# Patient Record
Sex: Female | Born: 1935 | Race: White | Hispanic: No | Marital: Single | Smoking: Never smoker
Health system: Southern US, Community
[De-identification: ages and names within clinical notes are randomized; demographics above are authoritative.]

## PROBLEM LIST (undated history)

## (undated) DIAGNOSIS — Z87820 Personal history of traumatic brain injury: Secondary | ICD-10-CM

## (undated) DIAGNOSIS — F0390 Unspecified dementia without behavioral disturbance: Secondary | ICD-10-CM

## (undated) DIAGNOSIS — E785 Hyperlipidemia, unspecified: Secondary | ICD-10-CM

## (undated) DIAGNOSIS — M1990 Unspecified osteoarthritis, unspecified site: Secondary | ICD-10-CM

## (undated) DIAGNOSIS — E559 Vitamin D deficiency, unspecified: Secondary | ICD-10-CM

## (undated) HISTORY — DX: Hyperlipidemia, unspecified: E78.5

## (undated) HISTORY — DX: Unspecified osteoarthritis, unspecified site: M19.90

## (undated) HISTORY — DX: Personal history of traumatic brain injury: Z87.820

## (undated) HISTORY — DX: Unspecified dementia, unspecified severity, without behavioral disturbance, psychotic disturbance, mood disturbance, and anxiety: F03.90

## (undated) HISTORY — DX: Vitamin D deficiency, unspecified: E55.9

## (undated) HISTORY — PX: CHOLECYSTECTOMY: SHX55

---

## 2019-01-14 ENCOUNTER — Encounter: Payer: Self-pay | Admitting: *Deleted

## 2019-01-14 ENCOUNTER — Non-Acute Institutional Stay (SKILLED_NURSING_FACILITY): Payer: Medicare Other | Admitting: Internal Medicine

## 2019-01-14 ENCOUNTER — Encounter: Payer: Self-pay | Admitting: Internal Medicine

## 2019-01-14 DIAGNOSIS — F0281 Dementia in other diseases classified elsewhere with behavioral disturbance: Secondary | ICD-10-CM

## 2019-01-14 DIAGNOSIS — E559 Vitamin D deficiency, unspecified: Secondary | ICD-10-CM | POA: Diagnosis not present

## 2019-01-14 DIAGNOSIS — G301 Alzheimer's disease with late onset: Secondary | ICD-10-CM | POA: Diagnosis not present

## 2019-01-14 DIAGNOSIS — R2681 Unsteadiness on feet: Secondary | ICD-10-CM | POA: Diagnosis not present

## 2019-01-14 DIAGNOSIS — E785 Hyperlipidemia, unspecified: Secondary | ICD-10-CM

## 2019-01-14 NOTE — Progress Notes (Signed)
Provider: Einar CrowGupta, Anjali  MD Location:  [facility] Room Number: [phone] [facility] Place of Service:  SNF [phone])  PCP: Mahlon GammonGupta, Anjali L, MD Patient Care Team: Mahlon GammonGupta, Anjali L, MD as PCP - General (Internal Medicine)  Extended Emergency Contact Information Primary Emergency Contact: Jearld LeschJeffreys, Margaret Mobile Phone: [phone] Relation: Relative  Code Status: Full Code Goals of Care: Advanced Directive information Advanced Directives 01/14/2019  Does Patient Have a Medical Advance Directive? Yes  Type of Estate agentAdvance Directive Healthcare Power of [place]orney;Living will  Does patient want to make changes to medical advance directive? No - Patient declined  Copy of Healthcare Power of Attorney in Chart? Yes - validated most recent copy scanned in chart (See row information)      Chief Complaint  Patient presents with  . New Admit To SNF    HPI: Patient is a 85 y.o. female seen today for admission to SNF for Long term Care Patient has h/o dementia did not have any history of imaging, hyperlipidemia, history of unstable gait,  Patient is unable to give detailed history most of the history was obtained from her previous PCP notes and social worker Patient was living by herself in [place].  She is not married and does not have any children.  Because of progressive dementia and inability for her to take care of herself she moved.with distant relative.  And he decided to move her to [facility]. Since patient has been here she has been very confused trying to [place] to go back to [place].  She refused to take Seroquel but today Nurse was able to convince her.  She was more calmer with me and told me that she understands she has to live in this facility. She did not have any acute complaints denies any cough or shortness of breath.  She does not take any medicines she says she tries to follow healthy diet and exercise. She was walking without any cane or walker.   Past Medical History:  Diagnosis Date  . Arthritis   . Dementia (HCC)   . Hx of concussion    [facility] [place]  . Hyperlipidemia   . Vitamin D deficiency, unspecified     The histories are not reviewed yet. Please review them in the "History" navigator section and refresh this SmartLink.  reports that she does not drink alcohol or use drugs. No history on file for tobacco. Social History   Socioeconomic History  . Marital status: Single    Spouse name: Not on file  . Number of children: Not on file  . Years of education: Not on file  . Highest education level: Not on file  Occupational History  . Not on file  Social Needs  . Financial resource strain: Not on file  . Food insecurity    Worry: Not on file    Inability: Not on file  . Transportation needs    Medical: Not on file    Non-medical: Not on file  Tobacco Use  . Smoking status: Not on file  Substance and Sexual Activity  . Alcohol use: Never    Frequency: Never  . Drug use: Never  . Sexual activity: Never  Lifestyle  . Physical activity    Days per week: Not on file    Minutes per session: Not on file  . Stress: Not on file  Relationships  . Social Musicianconnections    Talks on phone: Not on file    Gets together:  Not on file    Attends religious service: Not on file    Active member of club or organization: Not on file    Attends meetings of clubs or organizations: Not on file    Relationship status: Not on file  . Intimate partner violence    Fear of current or ex partner: Not on file    Emotionally abused: Not on file    Physically abused: Not on file    Forced sexual activity: Not on file  Other Topics Concern  . Not on file  Social History Narrative  . Not on file    Functional Status Survey:    No family history on file.  Health Maintenance  Topic Date Due  . TETANUS/TDAP  09/30/1951  . DEXA SCAN  10/01/1999  . PNA vac Low Risk Adult (1 of 2 - PCV13) 10/01/1999  .  INFLUENZA VACCINE  12/25/2018    Allergies  Allergen Reactions  . Fish-Derived Products   . Pravachol [Pravastatin Sodium]     Outpatient Encounter Medications as of 01/14/2019  Medication Sig  . QUEtiapine (SEROQUEL) 25 MG tablet Take 12.5 mg by mouth. Administer 12.5mg  po q6h PRN for agitation.   No facility-administered encounter medications on file as of 01/14/2019.     Review of Systems  Review of Systems  Constitutional: Negative for activity change, appetite change, chills, diaphoresis, fatigue and fever.  HENT: Negative for mouth sores, postnasal drip, rhinorrhea, sinus pain and sore throat.   Respiratory: Negative for apnea, cough, chest tightness, shortness of breath and wheezing.   Cardiovascular: Negative for chest pain, palpitations and leg swelling.  Gastrointestinal: Negative for abdominal distention, abdominal pain, constipation, diarrhea, nausea and vomiting.  Genitourinary: Negative for dysuria and frequency.  Musculoskeletal: Negative for arthralgias, joint swelling and myalgias.  Skin: Positive for rash.  Neurological: Negative for dizziness, syncope, weakness, light-headedness and numbness.  Psychiatric/Behavioral: Positive for behavioral problems, confusion and sleep disturbance.     Vitals:   01/14/19 1202  BP: 130/70  Pulse: 80  Resp: 20  Temp: (!) 97.3 F (36.3 C)  SpO2: 98%   There is no height or weight on file to calculate BMI. Physical Exam  Constitutional:  Well-developed and well-nourished.  HENT:  Head: Normocephalic.  Mouth/Throat: Oropharynx is clear and moist.  Eyes: Pupils are equal, round, and reactive to light.  Neck: Neck supple.  Cardiovascular: Normal rate and normal heart sounds.  No murmur heard. Pulmonary/Chest: Effort normal and breath sounds normal. No respiratory distress. No wheezes. She has no rales.  Abdominal: Soft. Bowel sounds are normal. No distension. There is no tenderness. There is no rebound.  Musculoskeletal:  No edema.  Lymphadenopathy: none Neurological: Her MMSE was 23/30. Did not do the Clock . Did not knew the year or date Recall was 0/3 No Focal deficits Skin: Skin is warm and dry.  Psychiatric: Normal mood and affect. Behavior is normal. Thought content normal.    Labs reviewed: Basic Metabolic Panel: No results for input(s): NA, K, CL, CO2, GLUCOSE, BUN, CREATININE, CALCIUM, MG, PHOS in the last 8760 hours. Liver Function Tests: No results for input(s): AST, ALT, ALKPHOS, BILITOT, PROT, ALBUMIN in the last 8760 hours. No results for input(s): LIPASE, AMYLASE in the last 8760 hours. No results for input(s): AMMONIA in the last 8760 hours. CBC: No results for input(s): WBC, NEUTROABS, HGB, HCT, MCV, PLT in the last 8760 hours. Cardiac Enzymes: No results for input(s): CKTOTAL, CKMB, CKMBINDEX, TROPONINI in the last 8760 hours.  BNP: Invalid input(s): POCBNP No results found for: HGBA1C No results found for: TSH No results found for: VITAMINB12 No results found for: FOLATE No results found for: IRON, TIBC, FERRITIN  Imaging and Procedures obtained prior to SNF admission: Patient was never admitted.  Assessment/Plan Late onset Alzheimer's disease with behavioral disturbance  Her Regular Labs were normal Check TSH,B12 level Has never had imaging but  don't think patient will agree She did agree to take Namenda BID. Says she got sick with older meds most likely Aricept Had to use Seroquel to calm her as she tries to [employer] Will Need to move to Memory Unit if her Covid is Negative Consider Depakote if continues behavior issues  Hyperlipidemia,  Fasting Lipid Pending  Vitamin D insufficiency Check Vit D Level Unsteady Gait with h/o Falls Supportive care for  Now Therapy Eval    Family/ staff Communication:   Labs/tests ordered:Lipid,TSH,B12,Vit D level Total time spent in this patient care encounter was  35_  minutes; greater than 50% of the visit spent  counseling patient and staff, reviewing records , Labs and coordinating care for problems addressed at this encounter.

## 2019-01-15 DIAGNOSIS — R2681 Unsteadiness on feet: Secondary | ICD-10-CM | POA: Insufficient documentation

## 2019-01-15 DIAGNOSIS — E559 Vitamin D deficiency, unspecified: Secondary | ICD-10-CM | POA: Insufficient documentation

## 2019-01-15 DIAGNOSIS — R419 Unspecified symptoms and signs involving cognitive functions and awareness: Secondary | ICD-10-CM | POA: Insufficient documentation

## 2019-01-15 DIAGNOSIS — E785 Hyperlipidemia, unspecified: Secondary | ICD-10-CM | POA: Insufficient documentation

## 2019-01-15 DIAGNOSIS — F0390 Unspecified dementia without behavioral disturbance: Secondary | ICD-10-CM | POA: Insufficient documentation

## 2019-01-15 DIAGNOSIS — F0150 Vascular dementia without behavioral disturbance: Secondary | ICD-10-CM | POA: Insufficient documentation

## 2019-01-27 ENCOUNTER — Non-Acute Institutional Stay (SKILLED_NURSING_FACILITY): Payer: Medicare Other | Admitting: Internal Medicine

## 2019-01-27 DIAGNOSIS — E785 Hyperlipidemia, unspecified: Secondary | ICD-10-CM | POA: Diagnosis not present

## 2019-01-27 DIAGNOSIS — F0281 Dementia in other diseases classified elsewhere with behavioral disturbance: Secondary | ICD-10-CM

## 2019-01-27 DIAGNOSIS — R2681 Unsteadiness on feet: Secondary | ICD-10-CM

## 2019-01-27 DIAGNOSIS — E559 Vitamin D deficiency, unspecified: Secondary | ICD-10-CM | POA: Diagnosis not present

## 2019-01-27 DIAGNOSIS — G301 Alzheimer's disease with late onset: Secondary | ICD-10-CM | POA: Diagnosis not present

## 2019-01-27 NOTE — Progress Notes (Signed)
Location:  [place] of Service:  SNF [phone]) Provider:    [name], MD  Patient Care Team: [name], MD as PCP - General (Internal Medicine)  Extended Emergency Contact Information Primary Emergency Contact: Arnoldo Morale Mobile Phone: [phone] Relation: Relative  Code Status:   Goals of care: Advanced Directive information Advanced Directives 01/14/2019  Does Patient Have a Medical Advance Directive? Yes  Type of Paramedic of [place];Living will  Does patient want to make changes to medical advance directive? No - Patient declined  Copy of [place] in Chart? Yes - validated most recent copy scanned in chart (See row information)     Chief Complaint  Patient presents with  . Acute Visit    HPI:  Pt is a 85 y.o. female seen today for an acute visit for behavior issues and discharge planning  Patient has h/o dementia did not have any history of imaging, hyperlipidemia, history of unstable gait,  Patient is unable to give detailed history most of the history was obtained from her previous PCP notes and social worker Patient was living by herself in [place].  She is not married and does not have any children.  Because of progressive dementia and inability for her to take care of herself she moved.with distant relative.  And he decided to move her to [facility].  Patient has been in the facility she was very confused and was trying to back her staff to go back to [place].  After my first visit I was able to convince her to start on Namenda.  And now she is also taking her Seroquel twice a day.  This has helped her calm down.  Today she was more adjusted to her room..  She said that she is liking it here and wants to continue staying here She stays independent in her ADLs.  Has a steady gait without any cane or walker Denies any acute complaints today Past Medical  History:  Diagnosis Date  . Arthritis   . Dementia ([place])   . Hx of concussion    [place]  . Hyperlipidemia   . Vitamin D deficiency, unspecified      Allergies  Allergen Reactions  . Fish-Derived Products   . Pravachol [Pravastatin Sodium]     Outpatient Encounter Medications as of 01/27/2019  Medication Sig  . memantine (NAMENDA) 5 MG tablet Take 5 mg by mouth 2 (two) times daily.  . QUEtiapine (SEROQUEL) 25 MG tablet Take 12.5 mg by mouth. Administer 12.5mg  po q6h PRN for agitation.   No facility-administered encounter medications on file as of 01/27/2019.     Review of Systems  Review of Systems  Constitutional: Negative for activity change, appetite change, chills, diaphoresis, fatigue and fever.  HENT: Negative for mouth sores, postnasal drip, rhinorrhea, sinus pain and sore throat.   Respiratory: Negative for apnea, cough, chest tightness, shortness of breath and wheezing.   Cardiovascular: Negative for chest pain, palpitations and leg swelling.  Gastrointestinal: Negative for abdominal distention, abdominal pain, constipation, diarrhea, nausea and vomiting.  Genitourinary: Negative for dysuria and frequency.  Musculoskeletal: Negative for arthralgias, joint swelling and myalgias.  Skin: Negative for rash.  Neurological: Negative for dizziness, syncope, weakness, light-headedness and numbness.  Psychiatric/Behavioral: Negative for behavioral problems, confusion and sleep disturbance.      There is no immunization history on file for this patient. Pertinent  Health Maintenance Due  Topic  Date Due  . DEXA SCAN  10/01/1999  . PNA vac Low Risk Adult (1 of 2 - PCV13) 10/01/1999  . INFLUENZA VACCINE  12/25/2018   No flowsheet data found. Functional Status Survey:    Vitals:   01/27/19 1816  BP: 120/62  Pulse: 73  Resp: 18  Temp: 98 F (36.7 C)   There is no height or weight on file to calculate BMI. Physical Exam Constitutional:  Oriented to person, place, and time. Well-developed and well-nourished.  HENT:  Head: Normocephalic.  Mouth/Throat: Oropharynx is clear and moist.  Eyes: Pupils are equal, round, and reactive to light.  Neck: Neck supple.  Cardiovascular: Normal rate and normal heart sounds.  No murmur heard. Pulmonary/Chest: Effort normal and breath sounds normal. No respiratory distress. No wheezes. She has no rales.  Abdominal: Soft. Bowel sounds are normal. No distension. There is no tenderness. There is no rebound.  Musculoskeletal: No edema.  Lymphadenopathy: none Neurological:Her MMSE was 23/30. Did not do the Clock . Did not knew the year or date Recall was 0/3 No Focal deficits  Skin: Skin is warm and dry.  Psychiatric: Normal mood and affect. Behavior is normal. Thought content normal.   Labs reviewed: No results for input(s): NA, K, CL, CO2, GLUCOSE, BUN, CREATININE, CALCIUM, MG, PHOS in the last 8760 hours. No results for input(s): AST, ALT, ALKPHOS, BILITOT, PROT, ALBUMIN in the last 8760 hours. No results for input(s): WBC, NEUTROABS, HGB, HCT, MCV, PLT in the last 8760 hours. No results found for: TSH No results found for: HGBA1C No results found for: CHOL, HDL, LDLCALC, LDLDIRECT, TRIG, CHOLHDL  Significant Diagnostic Results in last 30 days:  No results found.  Assessment/Plan Late onset Alzheimer's disease with behavioral disturbance  Her Regular Labs were normal MMSE was 23/30 Check TSH,B12 level are pending Has never had imaging but  don't think patient will agree Will increase her Namenda to 10 mg twice daily And make Seroquel 12.5 mg twice daily  Hyperlipidemia,  Fasting Lipid Pending  Vitamin D insufficiency Check Vit D Level Unsteady Gait with h/o Falls Supportive care for  Now Discharge Planning Discussed with DON Right now patient is doing little better and we are going to transition her to AL.  My only concern is elopement.  The nurses will check on patient  every few hours for the first few weeks when she just   Family/ staff Communication:   Labs/tests ordered:   Total time spent in this patient care encounter was  25_  minutes; greater than 50% of the visit spent counseling patient and staff, reviewing records , Labs and coordinating care for problems addressed at this encounter.

## 2019-02-09 ENCOUNTER — Encounter: Payer: Self-pay | Admitting: Nurse Practitioner

## 2019-02-09 ENCOUNTER — Non-Acute Institutional Stay (SKILLED_NURSING_FACILITY): Payer: Medicare Other | Admitting: Nurse Practitioner

## 2019-02-09 DIAGNOSIS — F0281 Dementia in other diseases classified elsewhere with behavioral disturbance: Secondary | ICD-10-CM

## 2019-02-09 DIAGNOSIS — G301 Alzheimer's disease with late onset: Secondary | ICD-10-CM

## 2019-02-09 DIAGNOSIS — E785 Hyperlipidemia, unspecified: Secondary | ICD-10-CM

## 2019-02-09 DIAGNOSIS — F02818 Dementia in other diseases classified elsewhere, unspecified severity, with other behavioral disturbance: Secondary | ICD-10-CM

## 2019-02-09 NOTE — Assessment & Plan Note (Addendum)
Will stay [facility] for safety, care assistance, continue Memantine 10mg  bid, Seroquel 12.5mg  bid. Obtain MMSE

## 2019-02-09 NOTE — Progress Notes (Signed)
Location:  [place] Room Number: [phone] Place of Service:  SNF [phone])  Provider: Marlana Latus  NP  PCP: [name], MD Patient Care Team: [name], MD as PCP - General (Internal Medicine)  Extended Emergency Contact Information Primary Emergency Contact: Arnoldo Morale Mobile Phone: [phone] Relation: Relative  Code Status: DNR Goals of care:  Advanced Directive information Advanced Directives 02/09/2019  Does Patient Have a Medical Advance Directive? Yes  Type of Advance Directive Out of facility DNR (pink MOST or yellow form)  Does patient want to make changes to medical advance directive? No - Patient declined  Copy of [place] in Chart? -  Pre-existing out of facility DNR order (yellow form or pink MOST form) Yellow form placed in chart (order not valid for inpatient use)     Allergies  Allergen Reactions  . Fish-Derived Products   . Pravachol [Pravastatin Sodium]     Chief Complaint  Patient presents with  . Discharge Note    HPI:  85 y.o. female with PMH dementia, on Memantine 10mg  bid. Her mood is stable on Quetiapine 12.5mg  bid. She spent a couple of weeks for quarantine prior to move in for long term care at [facility].     Past Medical History:  Diagnosis Date  . Arthritis   . Dementia ([place])   . Hx of concussion    [place]  . Hyperlipidemia   . Vitamin D deficiency, unspecified     Past Surgical History:  Procedure Laterality Date  . CHOLECYSTECTOMY        reports that she does not drink alcohol or use drugs. No history on file for tobacco. Social History   Socioeconomic History  . Marital status: Single    Spouse name: Not on file  . Number of children: Not on file  . Years of education: Not on file  . Highest education level: Not on file  Occupational History  . Not on file  Social Needs  . Financial resource strain: Not on file  . Food insecurity   Worry: Not on file    Inability: Not on file  . Transportation needs    Medical: Not on file    Non-medical: Not on file  Tobacco Use  . Smoking status: Not on file  Substance and Sexual Activity  . Alcohol use: Never    Frequency: Never  . Drug use: Never  . Sexual activity: Never  Lifestyle  . Physical activity    Days per week: Not on file    Minutes per session: Not on file  . Stress: Not on file  Relationships  . Social Herbalist on phone: Not on file    Gets together: Not on file    Attends religious service: Not on file    Active member of club or organization: Not on file    Attends meetings of clubs or organizations: Not on file    Relationship status: Not on file  . Intimate partner violence    Fear of current or ex partner: Not on file    Emotionally abused: Not on file    Physically abused: Not on file    Forced sexual activity: Not on file  Other Topics Concern  . Not on file  Social History Narrative  . Not on file   Functional Status Survey:    Allergies  Allergen Reactions  . Fish-Derived Products   . Pravachol [facility]  Sodium]     Pertinent  Health Maintenance Due  Topic Date Due  . DEXA SCAN  10/01/1999  . PNA vac Low Risk Adult (1 of 2 - PCV13) 10/01/1999  . INFLUENZA VACCINE  12/25/2018    Medications: Outpatient Encounter Medications as of 02/09/2019  Medication Sig  . memantine (NAMENDA) 10 MG tablet Take 10 mg by mouth 2 (two) times daily.  . QUEtiapine (SEROQUEL) 25 MG tablet Take 12.5 mg by mouth 2 (two) times daily. Administer 12.5mg  po q6h PRN for agitation.   . [DISCONTINUED] memantine (NAMENDA) 5 MG tablet Take 5 mg by mouth 2 (two) times daily.   No facility-administered encounter medications on file as of 02/09/2019.    ROS was provided with assistance of staff.  Review of Systems  Constitutional: Negative for activity change, appetite change, chills, diaphoresis, fatigue, fever and unexpected weight change.   HENT: Positive for hearing loss. Negative for congestion and voice change.   Respiratory: Negative for cough, shortness of breath and wheezing.   Cardiovascular: Negative for chest pain, palpitations and leg swelling.  Gastrointestinal: Negative for abdominal distention, abdominal pain, constipation, diarrhea, nausea and vomiting.  Genitourinary: Negative for difficulty urinating, dysuria and urgency.  Musculoskeletal: Negative for gait problem.  Skin: Negative for color change and pallor.  Neurological: Negative for dizziness, speech difficulty, weakness and headaches.       Dementia  Psychiatric/Behavioral: Positive for confusion. Negative for agitation, behavioral problems, hallucinations and sleep disturbance. The patient is not nervous/anxious.     Vitals:   02/09/19 1120  BP: 112/64  Pulse: 86  Resp: 20  Temp: 97.7 F (36.5 C)  SpO2: 95%  Weight: 148 lb 6.4 oz (67.3 kg)  Height: 5\' 6"  (1.676 m)   Body mass index is 23.95 kg/m. Physical Exam Vitals signs and nursing note reviewed.  Constitutional:      General: She is not in acute distress.    Appearance: Normal appearance. She is normal weight. She is not ill-appearing, toxic-appearing or diaphoretic.  HENT:     Head: Normocephalic and atraumatic.     Nose: Nose normal.     Mouth/Throat:     Mouth: Mucous membranes are moist.  Eyes:     Extraocular Movements: Extraocular movements intact.     Conjunctiva/sclera: Conjunctivae normal.     Pupils: Pupils are equal, round, and reactive to light.  Neck:     Musculoskeletal: Normal range of motion and neck supple.  Cardiovascular:     Rate and Rhythm: Normal rate and regular rhythm.     Heart sounds: No murmur.  Abdominal:     General: Bowel sounds are normal. There is no distension.     Palpations: Abdomen is soft.     Tenderness: There is no abdominal tenderness. There is no right CVA tenderness, left CVA tenderness, guarding or rebound.  Musculoskeletal:     Right  lower leg: No edema.     Left lower leg: No edema.  Skin:    General: Skin is warm and dry.  Neurological:     General: No focal deficit present.     Mental Status: She is alert. Mental status is at baseline.     Cranial Nerves: No cranial nerve deficit.     Motor: No weakness.     Coordination: Coordination normal.     Gait: Gait normal.     Comments: Oriented to person, place.   Psychiatric:        Mood and Affect: Mood normal.  Behavior: Behavior normal.     Labs reviewed: Basic Metabolic Panel: No results for input(s): NA, K, CL, CO2, GLUCOSE, BUN, CREATININE, CALCIUM, MG, PHOS in the last 8760 hours. Liver Function Tests: No results for input(s): AST, ALT, ALKPHOS, BILITOT, PROT, ALBUMIN in the last 8760 hours. No results for input(s): LIPASE, AMYLASE in the last 8760 hours. No results for input(s): AMMONIA in the last 8760 hours. CBC: No results for input(s): WBC, NEUTROABS, HGB, HCT, MCV, PLT in the last 8760 hours. Cardiac Enzymes: No results for input(s): CKTOTAL, CKMB, CKMBINDEX, TROPONINI in the last 8760 hours. BNP: Invalid input(s): POCBNP CBG: No results for input(s): GLUCAP in the last 8760 hours.  Procedures and Imaging Studies During Stay: No results found.  Assessment/Plan:   Dementia (HCC) Will stay AL [facility] for safety, care assistance, continue Memantine 10mg  bid, Seroquel 12.5mg  bid. Obtain MMSE  Hyperlipidemia Will update lipid panel.      Patient is being discharged with the following home health services:    Patient is being discharged with the following durable medical equipment:    Patient has been advised to f/u with their PCP in 1-2 weeks to for a transitions of care visit.  Social services at their facility was responsible for arranging this appointment.  Pt was provided with adequate prescriptions of noncontrolled medications to reach the scheduled appointment .  For controlled substances, a limited supply was provided as  appropriate for the individual patient.  If the pt normally receives these medications from a pain clinic or has a contract with another physician, these medications should be received from that clinic or physician only).    Future labs/tests needed: MMSE

## 2019-02-09 NOTE — Assessment & Plan Note (Signed)
Will update lipid panel.  °

## 2019-04-28 ENCOUNTER — Non-Acute Institutional Stay: Payer: Medicare Other | Admitting: Internal Medicine

## 2019-04-28 DIAGNOSIS — G301 Alzheimer's disease with late onset: Secondary | ICD-10-CM

## 2019-04-28 DIAGNOSIS — F0281 Dementia in other diseases classified elsewhere with behavioral disturbance: Secondary | ICD-10-CM

## 2019-04-28 DIAGNOSIS — F02818 Dementia in other diseases classified elsewhere, unspecified severity, with other behavioral disturbance: Secondary | ICD-10-CM

## 2019-04-28 DIAGNOSIS — E785 Hyperlipidemia, unspecified: Secondary | ICD-10-CM

## 2019-04-28 DIAGNOSIS — R2681 Unsteadiness on feet: Secondary | ICD-10-CM

## 2019-04-28 NOTE — Progress Notes (Addendum)
[facility] Service:  ALF (13)  Provider:   Code Status:  Goals of Care:  Advanced Directives 02/09/2019  Does Patient Have a Medical Advance Directive? Yes  Type of Advance Directive Out of facility DNR (pink MOST or yellow form)  Does patient want to make changes to medical advance directive? No - Patient declined  Copy of Healthcare Power of Attorney in Chart? -  Pre-existing out of facility DNR order (yellow form or pink MOST form) Yellow form placed in chart (order not valid for inpatient use)     Chief Complaint  Patient presents with  . Medical Management of Chronic Issues    HPI: Patient is a 85 y.o. female seen today for medical management of chronic diseases.   Patient is a long-term resident of assisted living  Patient has h/o dementia did not have any history of imaging, hyperlipidemia, history of unstable gait,  Patient is unable to give detailed history most of the history was obtained from her previous PCP notes and social worker Patient was living by herself in [place].  She is not married and does not have any children.  Because of progressive dementia and inability for her to take care of herself she moved.with distant relative.  And he decided [facility].  Since patient came here she has adjusted well to her assisted living facility. She did not have any complaints today.  She walks without any cane or walker.  Has not had any falls recently.  Per nurses she is doing well. She does have history of exit seeking trying to get back to [place]. Past Medical History:  Diagnosis Date  . Arthritis   . Dementia (HCC)   . Hx of concussion    [facility] [place]  . Hyperlipidemia   . Vitamin D deficiency, unspecified     Past Surgical History:  Procedure Laterality Date  . CHOLECYSTECTOMY      Allergies  Allergen Reactions  . Fish-Derived Products   . Pravachol  [Pravastatin Sodium]     Outpatient Encounter Medications as of 04/28/2019  Medication Sig  . memantine (NAMENDA) 10 MG tablet Take 10 mg by mouth 2 (two) times daily.  . QUEtiapine (SEROQUEL) 25 MG tablet Take 12.5 mg by mouth 2 (two) times daily. Administer 12.5mg  po q6h PRN for agitation.    No facility-administered encounter medications on file as of 04/28/2019.     Review of Systems:  Review of Systems  Review of Systems  Constitutional: Negative for activity change, appetite change, chills, diaphoresis, fatigue and fever.  HENT: Negative for mouth sores, postnasal drip, rhinorrhea, sinus pain and sore throat.   Respiratory: Negative for apnea, cough, chest tightness, shortness of breath and wheezing.   Cardiovascular: Negative for chest pain, palpitations and leg swelling.  Gastrointestinal: Negative for abdominal distention, abdominal pain, constipation, diarrhea, nausea and vomiting.  Genitourinary: Negative for dysuria and frequency.  Musculoskeletal: Negative for arthralgias, joint swelling and myalgias.  Skin: Negative for rash.  Neurological: Negative for dizziness, syncope, weakness, light-headedness and numbness.  Psychiatric/Behavioral: Negative for behavioral problems, confusion and sleep disturbance.     Health Maintenance  Topic Date Due  . TETANUS/TDAP  09/30/1951  . DEXA SCAN  10/01/1999  . PNA vac Low Risk Adult (1 of 2 - PCV13) 10/01/1999  . INFLUENZA VACCINE  12/25/2018    Physical Exam: Vitals:   04/28/19 1628  BP: 130/68  Pulse: 82  Resp: (!) 24  Temp: 97.8 F (36.6 C)  SpO2: 94%   There is no height or weight on file to calculate BMI. Physical Exam  Constitutional:  Well-developed and well-nourished.  HENT:  Head: Normocephalic.  Mouth/Throat: Oropharynx is clear and moist.  Eyes: Pupils are equal, round, and reactive to light.  Neck: Neck supple.  Cardiovascular: Normal rate and normal heart sounds.  No murmur heard. Pulmonary/Chest:  Effort normal and breath sounds normal. No respiratory distress. No wheezes. She has no rales.  Abdominal: Soft. Bowel sounds are normal. No distension. There is no tenderness. There is no rebound.  Musculoskeletal: No edema.  Lymphadenopathy: none Neurological: No Focal deficits Gait is steady Skin: Skin is warm and dry.  Psychiatric: Normal mood and affect. Behavior is normal. Thought content normal.    Labs reviewed: Basic Metabolic Panel: No results for input(s): NA, K, CL, CO2, GLUCOSE, BUN, CREATININE, CALCIUM, MG, PHOS, TSH in the last 8760 hours. Liver Function Tests: No results for input(s): AST, ALT, ALKPHOS, BILITOT, PROT, ALBUMIN in the last 8760 hours. No results for input(s): LIPASE, AMYLASE in the last 8760 hours. No results for input(s): AMMONIA in the last 8760 hours. CBC: No results for input(s): WBC, NEUTROABS, HGB, HCT, MCV, PLT in the last 8760 hours. Lipid Panel: No results for input(s): CHOL, HDL, LDLCALC, TRIG, CHOLHDL, LDLDIRECT in the last 8760 hours. No results found for: HGBA1C  Procedures since last visit: No results found.  Assessment/Plan Late onset Alzheimer's disease with behavioral disturbance ([place]) Continue on Namenda We will decrease her Seroquel to nightly and use 12.5 mg every morning as needed : Her MMSE was 23/30 in the Past Did not do the Clock . Did not knew the year or date Recall was 0/3 Hyperlipidemia, unspecified hyperlipidemia type LDL was elevated at 109 Patient would not agree to take any more medications Unsteady gait Is doing well with no recent falls  She had labs done in 8/20 TSH was normal Will repeat CBC and CMP     Labs/tests ordered:  * No order type specified * Next appt:  Visit date not found  Total time spent in this patient care encounter was  25_  minutes; greater than 50% of the visit spent counseling patient and staff, reviewing records , Labs and coordinating care for problems addressed at this encounter.

## 2019-05-03 LAB — CBC AND DIFFERENTIAL
HCT: 43 (ref 36–46)
Hemoglobin: 13.9 (ref 12.0–16.0)
Platelets: 263 (ref 150–399)
WBC: 6.8

## 2019-05-03 LAB — BASIC METABOLIC PANEL
BUN: 15 (ref 4–21)
CO2: 31 — AB (ref 13–22)
Chloride: 102 (ref 99–108)
Creatinine: 1 (ref 0.5–1.1)
Glucose: 92
Potassium: 4.5 (ref 3.4–5.3)
Sodium: 141 (ref 137–147)

## 2019-05-03 LAB — HEPATIC FUNCTION PANEL
ALT: 8 (ref 7–35)
AST: 14 (ref 13–35)
Alkaline Phosphatase: 81 (ref 25–125)
Bilirubin, Total: 0.4

## 2019-05-03 LAB — CBC: RBC: 4.94 (ref 3.87–5.11)

## 2019-06-21 ENCOUNTER — Non-Acute Institutional Stay: Payer: Medicare Other | Admitting: Internal Medicine

## 2019-06-21 ENCOUNTER — Encounter: Payer: Self-pay | Admitting: Internal Medicine

## 2019-06-21 DIAGNOSIS — F0281 Dementia in other diseases classified elsewhere with behavioral disturbance: Secondary | ICD-10-CM | POA: Diagnosis not present

## 2019-06-21 DIAGNOSIS — L309 Dermatitis, unspecified: Secondary | ICD-10-CM

## 2019-06-21 DIAGNOSIS — G301 Alzheimer's disease with late onset: Secondary | ICD-10-CM

## 2019-06-21 DIAGNOSIS — E785 Hyperlipidemia, unspecified: Secondary | ICD-10-CM | POA: Diagnosis not present

## 2019-06-21 DIAGNOSIS — F02818 Dementia in other diseases classified elsewhere, unspecified severity, with other behavioral disturbance: Secondary | ICD-10-CM

## 2019-06-21 NOTE — Progress Notes (Signed)
Location:   [facility] Room Number: 906 Place of Service:  ALF [phone]) Provider:  [name], MD  [name], MD  Patient Care Team: [name], MD as PCP - General (Internal Medicine)  Extended Emergency Contact Information Primary Emergency Contact: [name] Mobile Phone: [phone] Relation: Relative  Code Status:  DNR Goals of care: Advanced Directive information Advanced Directives 02/09/2019  Does Patient Have a Medical Advance Directive? Yes  Type of Advance Directive Out of facility DNR (pink MOST or yellow form)  Does patient want to make changes to medical advance directive? No - Patient declined  Copy of Healthcare Power of Attorney in Chart? -  Pre-existing out of facility DNR order (yellow form or pink MOST form) Yellow form placed in chart (order not valid for inpatient use)     Chief Complaint  Patient presents with  . Acute Visit    Rash    HPI:  Pt is a 86 y.o. female seen today for an acute visit for Rash in Lower Legs near Ankle  Patient is a long-term resident of assisted living  Patient has h/odementia do not have any imaging, hyperlipidemia, history of unstable gait,  Seen today for Rash in her Ankle area. Both Legs. It itches per patient. No Pain or swelling  Past Medical History:  Diagnosis Date  . Arthritis   . Dementia (HCC)   . Hx of concussion    [facility] [place]  . Hyperlipidemia   . Vitamin D deficiency, unspecified    Past Surgical History:  Procedure Laterality Date  . CHOLECYSTECTOMY      Allergies  Allergen Reactions  . Fish-Derived Products   . Pravachol [Pravastatin Sodium]     Allergies as of 06/21/2019      Reactions   Fish-derived Products    Pravachol [pravastatin Sodium]       Medication List       Accurate as of June 21, 2019 10:34 AM. If you have any questions, ask your nurse or doctor.        memantine 10 MG tablet Commonly known  as: NAMENDA Take 10 mg by mouth 2 (two) times daily.   QUEtiapine 25 MG tablet Commonly known as: SEROQUEL Take 12.5 mg by mouth 2 (two) times daily. Administer 12.5mg  po q6h PRN for agitation.       Review of Systems  Review of Systems  Constitutional: Negative for activity change, appetite change, chills, diaphoresis, fatigue and fever.  HENT: Negative for mouth sores, postnasal drip, rhinorrhea, sinus pain and sore throat.   Respiratory: Negative for apnea, cough, chest tightness, shortness of breath and wheezing.   Cardiovascular: Negative for chest pain, palpitations and leg swelling.  Gastrointestinal: Negative for abdominal distention, abdominal pain, constipation, diarrhea, nausea and vomiting.  Genitourinary: Negative for dysuria and frequency.  Musculoskeletal: Negative for arthralgias, joint swelling and myalgias.   Neurological: Negative for dizziness, syncope, weakness, light-headedness and numbness.  Psychiatric/Behavioral: Negative for behavioral problems, confusion and sleep disturbance.      There is no immunization history on file for this patient. Pertinent  Health Maintenance Due  Topic Date Due  . DEXA SCAN  10/01/1999  . PNA vac Low Risk Adult (1 of 2 - PCV13) 10/01/1999  . INFLUENZA VACCINE  12/25/2018   No flowsheet data found. Functional Status Survey:    Vitals:   06/21/19 1032  BP: 132/74  Pulse: 72  Resp: 18  Temp: 97.8 F (36.6 C)  SpO2: 93%  Weight: 152 lb 3.2 oz (69 kg)  Height: 5\' 6"  (1.676 m)   Body mass index is 24.57 kg/m. Physical Exam  Constitutional: . Well-developed and well-nourished.  HENT:  Head: Normocephalic.  Mouth/Throat: Oropharynx is clear and moist.  Eyes: Pupils are equal, round, and reactive to light.  Neck: Neck supple.  Cardiovascular: Normal rate and normal heart sounds.  No murmur heard. Pulmonary/Chest: Effort normal and breath sounds normal. No respiratory distress. No wheezes. She has no rales.    Abdominal: Soft. Bowel sounds are normal. No distension. There is no tenderness. There is no rebound.  Musculoskeletal: No edema.  Lymphadenopathy: none Neurological:Walking with No Assist Skin: Skin is warm and dry. Has Redness small area in her ankle both Legs with her Nail Marks. No Signs of infection Psychiatric: Normal mood and affect. Behavior is normal. Thought content normal.    Labs reviewed: Recent Labs    05/03/19 0000  NA 141  K 4.5  CL 102  CO2 31*  BUN 15  CREATININE 1.0   Recent Labs    05/03/19 0000  AST 14  ALT 8  ALKPHOS 81   Recent Labs    05/03/19 0000  WBC 6.8  HGB 13.9  HCT 43  PLT 263   No results found for: TSH No results found for: HGBA1C No results found for: CHOL, HDL, LDLCALC, LDLDIRECT, TRIG, CHOLHDL  Significant Diagnostic Results in last 30 days:  No results found.  Assessment/Plan Dermatitis Will try Triamcinolone 0.1 % BID till healed  Late onset Alzheimer's disease with behavioral disturbance ([place]) Continue Namenda and Seroquel       Family/ staff Communication:   Labs/tests ordered:   Total time spent in this patient care encounter was  25_  minutes; greater than 50% of the visit spent counseling patient and staff, reviewing records , Labs and coordinating care for problems addressed at this encounter.

## 2019-06-21 NOTE — Progress Notes (Signed)
Opened in error; Disregard.

## 2019-07-08 ENCOUNTER — Encounter: Payer: Self-pay | Admitting: Nurse Practitioner

## 2019-07-08 ENCOUNTER — Non-Acute Institutional Stay: Payer: Medicare Other | Admitting: Nurse Practitioner

## 2019-07-08 DIAGNOSIS — R2681 Unsteadiness on feet: Secondary | ICD-10-CM | POA: Diagnosis not present

## 2019-07-08 DIAGNOSIS — E785 Hyperlipidemia, unspecified: Secondary | ICD-10-CM

## 2019-07-08 NOTE — Assessment & Plan Note (Signed)
No falls since last visited.

## 2019-07-08 NOTE — Assessment & Plan Note (Signed)
Mildly elevated LDL 

## 2019-07-08 NOTE — Progress Notes (Signed)
Location:   [facility] Room Number: 906 Place of Service:  ALF (13) Provider:  Mast, Man NP  [name], MD  Patient Care Team: [name], MD as PCP - General (Internal Medicine)  Extended Emergency Contact Information Primary Emergency Contact: [name] Mobile Phone: [phone] Relation: Relative  Code Status:  DNR Goals of care: Advanced Directive information Advanced Directives 07/08/2019  Does Patient Have a Medical Advance Directive? Yes  Type of Advance Directive Healthcare Power of Attorney  Does patient want to make changes to medical advance directive? No - Patient declined  Copy of Healthcare Power of Attorney in Chart? Yes - validated most recent copy scanned in chart (See row information)  Pre-existing out of facility DNR order (yellow form or pink MOST form) -     Chief Complaint  Patient presents with  . Acute Visit    Confusion, anxiety    HPI:  Pt is a 86 y.o. female seen today for an acute visit for    Past Medical History:  Diagnosis Date  . Arthritis   . Dementia (HCC)   . Hx of concussion    [facility] [place]  . Hyperlipidemia   . Vitamin D deficiency, unspecified    Past Surgical History:  Procedure Laterality Date  . CHOLECYSTECTOMY      Allergies  Allergen Reactions  . Fish-Derived Products   . Pravachol [Pravastatin Sodium]     Allergies as of 07/08/2019      Reactions   Fish-derived Products    Pravachol [pravastatin Sodium]       Medication List       Accurate as of July 08, 2019  3:01 PM. If you have any questions, ask your nurse or doctor.        donepezil 5 MG tablet Commonly known as: ARICEPT Take 5 mg by mouth at bedtime.   memantine 10 MG tablet Commonly known as: NAMENDA Take 10 mg by mouth 2 (two) times daily.   QUEtiapine 25 MG tablet Commonly known as: SEROQUEL Take 12.5 mg by mouth 2 (two) times daily. Administer 12.5mg  po q6h PRN for  agitation.   triamcinolone cream 0.1 % Commonly known as: KENALOG Apply 1 application topically. Apply to rash on ankles twice a day until healed.       Review of Systems   There is no immunization history on file for this patient. Pertinent  Health Maintenance Due  Topic Date Due  . DEXA SCAN  10/01/1999  . PNA vac Low Risk Adult (1 of 2 - PCV13) 10/01/1999  . INFLUENZA VACCINE  12/25/2018   No flowsheet data found. Functional Status Survey:    Vitals:   07/08/19 1457  BP: 138/80  Pulse: 98  Resp: 20  Temp: 97.6 F (36.4 C)  SpO2: 94%  Weight: 156 lb 6.4 oz (70.9 kg)  Height: 5\' 6"  (1.676 m)   Body mass index is 25.24 kg/m. Physical Exam  Labs reviewed: Recent Labs    05/03/19 0000  NA 141  K 4.5  CL 102  CO2 31*  BUN 15  CREATININE 1.0   Recent Labs    05/03/19 0000  AST 14  ALT 8  ALKPHOS 81   Recent Labs    05/03/19 0000  WBC 6.8  HGB 13.9  HCT 43  PLT 263   No results found for: TSH No results found for: HGBA1C No results found for: CHOL, HDL, LDLCALC, LDLDIRECT, TRIG, CHOLHDL  Significant Diagnostic Results in last 30 days:  No results found.  Assessment/Plan There are no diagnoses linked to this encounter.   Family/ staff Communication:   Labs/tests ordered:

## 2019-07-08 NOTE — Progress Notes (Addendum)
Location:   [facility] Room Number: [place] of Service:  ALF (13) Provider: Lennie Odor Magdalene Tardiff NP  [name], MD  Patient Care Team: [name], MD as PCP - General (Internal Medicine)  Extended Emergency Contact Information Primary Emergency Contact: Arnoldo Morale Mobile Phone: [phone] Relation: Relative  Code Status: DNR Goals of care: Advanced Directive information Advanced Directives 07/08/2019  Does Patient Have a Medical Advance Directive? Yes  Type of Advance Directive [place]  Does patient want to make changes to medical advance directive? No - Patient declined  Copy of [place] in Chart? Yes - validated most recent copy scanned in chart (See row information)  Pre-existing out of facility DNR order (yellow form or pink MOST form) -     Chief Complaint  Patient presents with  . Acute Visit    Confusion, anxiety    HPI:  Pt is a 86 y.o. female seen today for an acute visit for staff reported the patient confusion, packing up her belongs occasionally, appears anxious when she is confused. The patient resides in [place] since 12/2018, taking Memantine for memory. She knows she is in [place], but not sure her current residential location is [place]. The patient denied pain or discomfort, admitted her memory deficits, stated she sleeps/eats well, conversing and smiling except repetitive at times. Hx of hyperlipidemia, unsteady gait, Vit D deficiency.    Past Medical History:  Diagnosis Date  . Arthritis   . Dementia ([place])   . Hx of concussion    [place]  . Hyperlipidemia   . Vitamin D deficiency, unspecified    Past Surgical History:  Procedure Laterality Date  . CHOLECYSTECTOMY      Allergies  Allergen Reactions  . Fish-Derived Products   . Pravachol [Pravastatin Sodium]     Allergies as of 07/08/2019      Reactions   Fish-derived Products    Pravachol [pravastatin Sodium]       Medication List       Accurate as of July 08, 2019  4:35 PM. If you have any questions, ask your nurse or doctor.        donepezil 5 MG tablet Commonly known as: ARICEPT Take 5 mg by mouth at bedtime.   memantine 10 MG tablet Commonly known as: NAMENDA Take 10 mg by mouth 2 (two) times daily.   QUEtiapine 25 MG tablet Commonly known as: SEROQUEL Take 12.5 mg by mouth 2 (two) times daily. Administer 12.43m po q6h PRN for agitation.   triamcinolone cream 0.1 % Commonly known as: KENALOG Apply 1 application topically. Apply to rash on ankles twice a day until healed.      ROS was provided with assistance of staff.  Review of Systems  Constitutional: Negative for activity change, appetite change, chills, diaphoresis, fatigue and fever.  HENT: Positive for hearing loss. Negative for congestion and voice change.   Eyes: Negative for visual disturbance.  Respiratory: Negative for cough and shortness of breath.   Cardiovascular: Negative for chest pain, palpitations and leg swelling.  Gastrointestinal: Negative for abdominal distention, abdominal pain, constipation, diarrhea, nausea and vomiting.  Genitourinary: Negative for difficulty urinating, dysuria and urgency.  Musculoskeletal: Positive for gait problem.  Skin: Negative for color change and pallor.  Neurological: Negative for dizziness, speech difficulty, weakness and headaches.       Dementia  Psychiatric/Behavioral: Positive for confusion. Negative for agitation, behavioral problems, hallucinations and  sleep disturbance. The patient is not nervous/anxious.      There is no immunization history on file for this patient. Pertinent  Health Maintenance Due  Topic Date Due  . DEXA SCAN  10/01/1999  . PNA vac Low Risk Adult (1 of 2 - PCV13) 10/01/1999  . INFLUENZA VACCINE  12/25/2018   No flowsheet data found. Functional Status Survey:    Vitals:   07/08/19 1457  BP: 138/80   Pulse: 98  Resp: 20  Temp: 97.6 F (36.4 C)  SpO2: 94%  Weight: 156 lb 6.4 oz (70.9 kg)  Height: '5\' 6"'  (1.676 m)   Body mass index is 25.24 kg/m. Physical Exam Vitals and nursing note reviewed.  Constitutional:      General: She is not in acute distress.    Appearance: Normal appearance. She is not ill-appearing, toxic-appearing or diaphoretic.  HENT:     Head: Normocephalic and atraumatic.     Nose: Nose normal.     Mouth/Throat:     Mouth: Mucous membranes are moist.  Eyes:     Extraocular Movements: Extraocular movements intact.     Conjunctiva/sclera: Conjunctivae normal.     Pupils: Pupils are equal, round, and reactive to light.  Cardiovascular:     Rate and Rhythm: Normal rate and regular rhythm.     Heart sounds: No murmur.  Pulmonary:     Breath sounds: No wheezing, rhonchi or rales.  Abdominal:     General: Bowel sounds are normal. There is no distension.     Palpations: Abdomen is soft.     Tenderness: There is no abdominal tenderness. There is no right CVA tenderness, left CVA tenderness, guarding or rebound.  Musculoskeletal:     Cervical back: Normal range of motion and neck supple.     Right lower leg: No edema.     Left lower leg: No edema.  Skin:    General: Skin is warm and dry.  Neurological:     General: No focal deficit present.     Mental Status: She is alert. Mental status is at baseline.     Motor: No weakness.     Coordination: Coordination normal.     Gait: Gait abnormal.     Comments: Oriented to person, long term memory about her mom, dad, education, her occupation, home etc seems intact  Psychiatric:        Mood and Affect: Mood normal.        Behavior: Behavior normal.        Thought Content: Thought content normal.     Labs reviewed: Recent Labs    05/03/19 0000  NA 141  K 4.5  CL 102  CO2 31*  BUN 15  CREATININE 1.0   Recent Labs    05/03/19 0000  AST 14  ALT 8  ALKPHOS 81   Recent Labs    05/03/19 0000  WBC  6.8  HGB 13.9  HCT 43  PLT 263   No results found for: TSH No results found for: HGBA1C No results found for: CHOL, HDL, LDLCALC, LDLDIRECT, TRIG, CHOLHDL  Significant Diagnostic Results in last 30 days:  No results found.  Assessment/Plan: Unsteady gait No falls since last visited.   Hyperlipidemia Mildly elevated LDL     Family/ staff Communication: plan of care reviewed with the patient and charge nurse.   Labs/tests ordered:  CBC/diff, CMP/eGFR, TSH, Vit B12  Time spend 40 minutes.

## 2019-07-12 LAB — BASIC METABOLIC PANEL
BUN: 13 (ref 4–21)
CO2: 30 — AB (ref 13–22)
Chloride: 104 (ref 99–108)
Creatinine: 1 (ref 0.5–1.1)
Glucose: 91
Potassium: 4.4 (ref 3.4–5.3)
Sodium: 141 (ref 137–147)

## 2019-07-12 LAB — COMPREHENSIVE METABOLIC PANEL
Albumin: 3.6 (ref 3.5–5.0)
Calcium: 9.1 (ref 8.7–10.7)
GFR calc Af Amer: 61
GFR calc non Af Amer: 53

## 2019-07-12 LAB — HEPATIC FUNCTION PANEL
ALT: 8 (ref 7–35)
AST: 13 (ref 13–35)
Alkaline Phosphatase: 73 (ref 25–125)
Bilirubin, Total: 0.4

## 2019-07-12 LAB — CBC: RBC: 4.66 (ref 3.87–5.11)

## 2019-07-12 LAB — CBC AND DIFFERENTIAL
HCT: 40 (ref 36–46)
Hemoglobin: 12.9 (ref 12.0–16.0)
Neutrophils Absolute: 4158
Platelets: 231 (ref 150–399)
WBC: 6.6

## 2019-07-12 LAB — VITAMIN B12: Vitamin B-12: 466

## 2019-07-21 ENCOUNTER — Non-Acute Institutional Stay: Payer: Medicare Other | Admitting: Internal Medicine

## 2019-07-21 DIAGNOSIS — F0281 Dementia in other diseases classified elsewhere with behavioral disturbance: Secondary | ICD-10-CM

## 2019-07-21 DIAGNOSIS — G301 Alzheimer's disease with late onset: Secondary | ICD-10-CM | POA: Diagnosis not present

## 2019-07-21 NOTE — Progress Notes (Signed)
Location:  [facility] Room Number: 906-A Place of Service:  ALF [phone]) Provider:  [name], MD  Patient Care Team: [name], MD as PCP - General (Internal Medicine)  Extended Emergency Contact Information Primary Emergency Contact: Arnoldo Morale Mobile Phone: [phone] Relation: Relative  Code Status:  DNR Goals of care: Advanced Directive information Advanced Directives 07/21/2019  Does Patient Have a Medical Advance Directive? Yes  Type of Advance Directive Out of facility DNR (pink MOST or yellow form)  Does patient want to make changes to medical advance directive? No - Patient declined  Copy of [place] in Chart? No - copy requested  Pre-existing out of facility DNR order (yellow form or pink MOST form) -     Chief Complaint  Patient presents with  . Acute Visit    Patient is seen for behavior issues.    HPI:  Pt is an 86 y.o. female seen today for an acute visit for Behavior Issues  Patient is a long-term resident of assisted living Patient has h/odementia no recent  imaging, hyperlipidemia, history of unstable gait  Patient was living by herself in [place].  She is not married and does not have any children.  Because of progressive dementia and inability for her to take care of herself she moved.with distant relative.  And he decided to move her to [facility] home community.  She has been doing well but recently she has been calling her POA in the evening and Packing her bags to leave the Facility. POA is worried for her safety and wants to know if we can do something to help her Patient unable to give detail history. She is very active walks very fast with no cane or walker. Appetite Good. Weight stable She says she is went for the trip recently and is planning to go home somewhere in [place] in the Room  Past Medical History:  Diagnosis Date  . Arthritis   . Dementia  ([place])   . Hx of concussion    [place]  . Hyperlipidemia   . Vitamin D deficiency, unspecified    Past Surgical History:  Procedure Laterality Date  . CHOLECYSTECTOMY      Allergies  Allergen Reactions  . Fish-Derived Products   . Pravachol [Pravastatin Sodium]     Outpatient Encounter Medications as of 07/21/2019  Medication Sig  . donepezil (ARICEPT) 5 MG tablet Take 5 mg by mouth at bedtime.  . memantine (NAMENDA) 10 MG tablet Take 10 mg by mouth 2 (two) times daily.  . QUEtiapine (SEROQUEL) 25 MG tablet Take 12.5 mg by mouth 2 (two) times daily. Administer 12.5mg  po q6h PRN for agitation.   . triamcinolone cream (KENALOG) 0.1 % Apply 1 application topically. Apply to rash on ankles twice a day until healed.   No facility-administered encounter medications on file as of 07/21/2019.    Review of Systems  Review of Systems  Constitutional: Negative for activity change, appetite change, chills, diaphoresis, fatigue and fever.  HENT: Negative for mouth sores, postnasal drip, rhinorrhea, sinus pain and sore throat.   Respiratory: Negative for apnea, cough, chest tightness, shortness of breath and wheezing.   Cardiovascular: Negative for chest pain, palpitations and leg swelling.  Gastrointestinal: Negative for abdominal distention, abdominal pain, constipation, diarrhea, nausea and vomiting.  Genitourinary: Negative for dysuria and frequency.  Musculoskeletal: Negative for arthralgias, joint swelling and myalgias.  Skin: Negative for rash.  Neurological:  Negative for dizziness, syncope, weakness, light-headedness and numbness.  Psychiatric/Behavioral: Negative for behavioral problems, confusion and sleep disturbance.     Immunization History  Administered Date(s) Administered  . Influenza-Unspecified 02/26/2019  . Moderna SARS-COVID-2 Vaccination 05/28/2019, 06/25/2019   Pertinent  Health Maintenance Due  Topic Date Due  . DEXA SCAN  10/01/1999  .  PNA vac Low Risk Adult (1 of 2 - PCV13) 10/01/1999  . INFLUENZA VACCINE  Completed   No flowsheet data found. Functional Status Survey:    Vitals:   07/21/19 1509  BP: 138/60  Pulse: 64  Resp: 18  Temp: (!) 97.3 F (36.3 C)  TempSrc: Oral  SpO2: 97%  Weight: 156 lb 6.4 oz (70.9 kg)  Height: 5\' 6"  (1.676 m)   Body mass index is 25.24 kg/m. Physical Exam  Constitutional:  Well-developed and well-nourished.  HENT:  Head: Normocephalic.  Mouth/Throat: Oropharynx is clear and moist.  Eyes: Pupils are equal, round, and reactive to light.  Neck: Neck supple.  Cardiovascular: Normal rate and normal heart sounds.  No murmur heard. Pulmonary/Chest: Effort normal and breath sounds normal. No respiratory distress. No wheezes. She has no rales.  Abdominal: Soft. Bowel sounds are normal. No distension. There is no tenderness. There is no rebound.  Musculoskeletal: No edema.  Lymphadenopathy: none Neurological: Not Oriented to time or place No Focal deficits. Walk very fast with no assists Skin: Skin is warm and dry.  Psychiatric: Normal mood and affect. Behavior is normal. Thought content normal.    Labs reviewed: Recent Labs    05/03/19 0000  NA 141  K 4.5  CL 102  CO2 31*  BUN 15  CREATININE 1.0   Recent Labs    05/03/19 0000  AST 14  ALT 8  ALKPHOS 81   Recent Labs    05/03/19 0000  WBC 6.8  HGB 13.9  HCT 43  PLT 263   No results found for: TSH No results found for: HGBA1C No results found for: CHOL, HDL, LDLCALC, LDLDIRECT, TRIG, CHOLHDL  Significant Diagnostic Results in last 30 days:  No results found.  Assessment/Plan Late onset Alzheimer's disease with behavioral disturbance  Her Regular Labs were normal Check TSH,B12 level Has never had imaging but  don't think patient will agree At this time will continue Namenda Increase her Seroquel to 25mg  Will try to give her early  in the evening See if it helps   Family/ staff Communication:    Labs/tests ordered:    Total time spent in this patient care encounter was  25_  minutes; greater than 50% of the visit spent counseling patient and staff, reviewing records , Labs and coordinating care for problems addressed at this encounter.

## 2019-07-26 ENCOUNTER — Encounter: Payer: Self-pay | Admitting: Internal Medicine

## 2019-07-26 ENCOUNTER — Non-Acute Institutional Stay: Payer: Medicare Other | Admitting: Internal Medicine

## 2019-07-26 DIAGNOSIS — G301 Alzheimer's disease with late onset: Secondary | ICD-10-CM

## 2019-07-26 DIAGNOSIS — F02818 Dementia in other diseases classified elsewhere, unspecified severity, with other behavioral disturbance: Secondary | ICD-10-CM

## 2019-07-26 DIAGNOSIS — F0281 Dementia in other diseases classified elsewhere with behavioral disturbance: Secondary | ICD-10-CM | POA: Diagnosis not present

## 2019-07-26 NOTE — Progress Notes (Signed)
Location:  [facility] Room Number: 906-A Place of Service:  ALF [phone]) Provider:  [name], MD  Patient Care Team: [name], MD as PCP - General (Internal Medicine)  Extended Emergency Contact Information Primary Emergency Contact: [name] Mobile Phone: [phone] Relation: Relative  Code Status:  DNR Goals of care: Advanced Directive information Advanced Directives 07/21/2019  Does Patient Have a Medical Advance Directive? Yes  Type of Advance Directive Out of facility DNR (pink MOST or yellow form)  Does patient want to make changes to medical advance directive? No - Patient declined  Copy of Healthcare Power of Attorney in Chart? No - copy requested  Pre-existing out of facility DNR order (yellow form or pink MOST form) -     Chief Complaint  Patient presents with  . Acute Visit    Patient is seen for behavior issues    HPI:  Pt is a 86 y.o. female seen today for an acute visit for Behavior Issues Patient is a long-term resident of assisted living Patient has h/odementia no recent imaging, hyperlipidemia, history of unstable gait  Patient was living by herself in [place]. She is not married and does not have any children. Because of progressive dementia and inability for her to take care of herself she moved.with distant relative.And he decided to move her to friend's home community.  Recently Patient is getting very disruptive. Trying to leave the facility. I had increased her Seroquel to 25mg  HS But she today was very upset Had her bags Packed . Said someone is sending her Mail and trying to get her money. She wants to go back to her house in  Past Medical History:  Diagnosis Date  . Arthritis   . Dementia (HCC)   . Hx of concussion    [place] [employer]  . Hyperlipidemia   . Vitamin D deficiency, unspecified    Past Surgical History:  Procedure Laterality Date  .  CHOLECYSTECTOMY      Allergies  Allergen Reactions  . Fish-Derived Products   . Pravachol [Pravastatin Sodium]     Outpatient Encounter Medications as of 07/26/2019  Medication Sig  . donepezil (ARICEPT) 5 MG tablet Take 5 mg by mouth at bedtime.  . memantine (NAMENDA) 10 MG tablet Take 10 mg by mouth 2 (two) times daily.  . QUEtiapine (SEROQUEL) 25 MG tablet Take 12.5 mg by mouth in the morning.   09/25/2019 QUEtiapine (SEROQUEL) 25 MG tablet Take 25 mg by mouth at bedtime.  . triamcinolone cream (KENALOG) 0.1 % Apply 1 application topically. Apply to rash on ankles twice a day until healed.   No facility-administered encounter medications on file as of 07/26/2019.    Review of Systems  Keep saying I am fine nothing is wrong with me  Immunization History  Administered Date(s) Administered  . Influenza-Unspecified 02/26/2019  . Moderna SARS-COVID-2 Vaccination 05/28/2019, 06/25/2019   Pertinent  Health Maintenance Due  Topic Date Due  . DEXA SCAN  10/01/1999  . PNA vac Low Risk Adult (1 of 2 - PCV13) 10/01/1999  . INFLUENZA VACCINE  Completed   No flowsheet data found. Functional Status Survey:    Vitals:   07/26/19 1605  BP: 138/60  Pulse: 64  Resp: 18  Temp: 98.7 F (37.1 C)  TempSrc: Oral  SpO2: 93%  Weight: 151 lb 9.6 oz (68.8 kg)  Height: 5\' 6"  (1.676 m)   Body mass index is 24.47 kg/m. Physical Exam Constitutional:  Well-developed and well-nourished.  HENT:  Head: Normocephalic.  Mouth/Throat: Oropharynx is clear and moist.  Eyes: Pupils are equal, round, and reactive to light.  Neck: Neck supple.  Cardiovascular: Normal rate and normal heart sounds.  No murmur heard. Pulmonary/Chest: Effort normal and breath sounds normal. No respiratory distress. No wheezes. She has no rales.  Abdominal: Soft. Bowel sounds are normal. No distension. There is no tenderness. There is no rebound.  Musculoskeletal: No edema.  Lymphadenopathy: none Neurological:Very Anxious   Says she is visiting this place and has to get back home in [place] to do her taxes. No Focal Deficits Skin: Skin is warm and dry.  Psychiatric: Normal mood and affect. Behavior is normal. Thought content normal.   Labs reviewed: Recent Labs    05/03/19 0000 07/12/19 0000  NA 141 141  K 4.5 4.4  CL 102 104  CO2 31* 30*  BUN 15 13  CREATININE 1.0 1.0  CALCIUM  --  9.1   Recent Labs    05/03/19 0000 07/12/19 0000  AST 14 13  ALT 8 8  ALKPHOS 81 73  ALBUMIN  --  3.6   Recent Labs    05/03/19 0000 07/12/19 0000  WBC 6.8 6.6  NEUTROABS  --  4,158  HGB 13.9 12.9  HCT 43 40  PLT 263 231   No results found for: TSH No results found for: HGBA1C No results found for: CHOL, HDL, LDLCALC, LDLDIRECT, TRIG, CHOLHDL  Significant Diagnostic Results in last 30 days:  No results found.  Assessment/Plan Late onset Alzheimer's disease with behavioral disturbance (HCC) Will Increase her Seroquel to 25 mg BID Already on Namenda  Ativan BID but patient refusing it Labs few week ago were normal No other sign of infection or any other issue Continue to follow Does need Imaging but patient will not agree    Family/ staff Communication:   Labs/tests ordered:

## 2019-08-24 ENCOUNTER — Encounter: Payer: Self-pay | Admitting: Nurse Practitioner

## 2019-08-24 ENCOUNTER — Non-Acute Institutional Stay: Payer: Medicare Other | Admitting: Nurse Practitioner

## 2019-08-24 DIAGNOSIS — R42 Dizziness and giddiness: Secondary | ICD-10-CM

## 2019-08-24 DIAGNOSIS — G301 Alzheimer's disease with late onset: Secondary | ICD-10-CM

## 2019-08-24 DIAGNOSIS — R451 Restlessness and agitation: Secondary | ICD-10-CM

## 2019-08-24 DIAGNOSIS — R2681 Unsteadiness on feet: Secondary | ICD-10-CM

## 2019-08-24 DIAGNOSIS — F02818 Dementia in other diseases classified elsewhere, unspecified severity, with other behavioral disturbance: Secondary | ICD-10-CM

## 2019-08-24 DIAGNOSIS — F0281 Dementia in other diseases classified elsewhere with behavioral disturbance: Secondary | ICD-10-CM

## 2019-08-24 NOTE — Assessment & Plan Note (Addendum)
No agitation-pacing, packing in evenings  presently, continue Seroquel, dc Lorazepam, observe.

## 2019-08-24 NOTE — Progress Notes (Signed)
Location:   [facility] Room Number: [place] of Service:  ALF [phone]) Provider:  Shabree Tebbetts, NP   Patient Care Team: [name], MD as PCP - General (Internal Medicine)  Extended Emergency Contact Information Primary Emergency Contact: Arnoldo Morale Mobile Phone: [phone] Relation: Relative  Code Status:  DNR Goals of care: Advanced Directive information Advanced Directives 08/24/2019  Does Patient Have a Medical Advance Directive? Yes  Type of Advance Directive Living will;Healthcare Power of [place];Out of facility DNR (pink MOST or yellow form)  Does patient want to make changes to medical advance directive? No - Patient declined  Copy of [place] in Chart? Yes - validated most recent copy scanned in chart (See row information)  Pre-existing out of facility DNR order (yellow form or pink MOST form) -     Chief Complaint  Patient presents with  . Acute Visit    Dizziness/Sleepy    HPI:  Pt is a 86 y.o. female seen today for an acute visit for c/o lightheaded and falls asleep right after Lorazepam 0.23m bid started for agitation, Seroquel was increased to 274mqd 07/21/19 then increased to  bid 07/26/19. Also the patient stated she has to follows a line on the floor to walk straight, feels wobbly, like getting drunk after taking Lorazepam. She denied headache, change of vision, chest pain/pressure, palpitation, or focal weakness. Hx of dementia[facility], on Memantine and Donepezil 37m96md for memory.    Past Medical History:  Diagnosis Date  . Arthritis   . Dementia ([place] . Hx of concussion    [place] Hyperlipidemia   . Vitamin D deficiency, unspecified    Past Surgical History:  Procedure Laterality Date  . CHOLECYSTECTOMY      Allergies  Allergen Reactions  . Fish-Derived Products   . Pravachol [Pravastatin Sodium]     Allergies as of  08/24/2019      Reactions   Fish-derived Products    Pravachol [pravastatin Sodium]       Medication List       Accurate as of August 24, 2019  3:09 PM. If you have any questions, ask your nurse or doctor.        STOP taking these medications   LORazepam 0.5 MG tablet Commonly known as: ATIVAN Stopped by: Sher Hellinger X Marykatherine Sherwood, NP     TAKE these medications   donepezil 5 MG tablet Commonly known as: ARICEPT Take 5 mg by mouth at bedtime.   memantine 10 MG tablet Commonly known as: NAMENDA Take 10 mg by mouth 2 (two) times daily.   QUEtiapine 25 MG tablet Commonly known as: SEROQUEL Take 25 mg by mouth at bedtime. What changed: Another medication with the same name was removed. Continue taking this medication, and follow the directions you see here. Changed by: Corisa Montini X Kathyleen Radice, NP   triamcinolone cream 0.1 % Commonly known as: KENALOG Apply 1 application topically. Apply to rash on ankles twice a day until healed.       Review of Systems  Constitutional: Negative for activity change, appetite change, fatigue and fever.  HENT: Positive for hearing loss. Negative for congestion and voice change.   Eyes: Negative for visual disturbance.  Respiratory: Negative for cough and shortness of breath.   Cardiovascular: Negative for chest pain, palpitations and leg swelling.  Gastrointestinal: Negative for abdominal distention, abdominal pain and constipation.  Genitourinary: Negative  for difficulty urinating, dysuria and urgency.  Musculoskeletal: Positive for gait problem.  Skin: Negative for color change.  Neurological: Positive for dizziness and light-headedness. Negative for tremors, seizures, syncope, facial asymmetry, speech difficulty, weakness and headaches.       Dementia  Psychiatric/Behavioral: Positive for confusion. Negative for agitation, behavioral problems and sleep disturbance. The patient is not nervous/anxious.     Immunization History  Administered Date(s) Administered    . Influenza-Unspecified 02/26/2019  . Moderna SARS-COVID-2 Vaccination 05/28/2019, 06/25/2019   Pertinent  Health Maintenance Due  Topic Date Due  . DEXA SCAN  Never done  . PNA vac Low Risk Adult (1 of 2 - PCV13) Never done  . INFLUENZA VACCINE  Completed   No flowsheet data found. Functional Status Survey:    Vitals:   08/24/19 1338  BP: 126/62  Pulse: 64  Resp: 18  Temp: 98 F (36.7 C)  SpO2: 96%  Weight: 153 lb 9.6 oz (69.7 kg)  Height: '5\' 6"'  (1.676 m)   Body mass index is 24.79 kg/m. Physical Exam Vitals and nursing note reviewed.  Constitutional:      General: She is not in acute distress.    Appearance: Normal appearance. She is not ill-appearing, toxic-appearing or diaphoretic.  HENT:     Head: Normocephalic and atraumatic.     Nose: Nose normal.     Mouth/Throat:     Mouth: Mucous membranes are moist.  Eyes:     Extraocular Movements: Extraocular movements intact.     Conjunctiva/sclera: Conjunctivae normal.     Pupils: Pupils are equal, round, and reactive to light.  Cardiovascular:     Rate and Rhythm: Normal rate and regular rhythm.     Heart sounds: No murmur.  Pulmonary:     Breath sounds: No wheezing, rhonchi or rales.  Abdominal:     General: Bowel sounds are normal. There is no distension.     Palpations: Abdomen is soft.     Tenderness: There is no abdominal tenderness.  Musculoskeletal:        General: No swelling or tenderness.     Cervical back: Normal range of motion and neck supple.     Right lower leg: No edema.     Left lower leg: No edema.  Skin:    General: Skin is warm and dry.  Neurological:     General: No focal deficit present.     Mental Status: She is alert. Mental status is at baseline.     Cranial Nerves: No cranial nerve deficit.     Motor: No weakness.     Coordination: Coordination normal.     Gait: Gait abnormal.     Comments: Oriented to person, long term memory about her mom, dad, education, her occupation, home  etc seems intact  Psychiatric:        Mood and Affect: Mood normal.        Behavior: Behavior normal.        Thought Content: Thought content normal.     Labs reviewed: Recent Labs    05/03/19 0000 07/12/19 0000  NA 141 141  K 4.5 4.4  CL 102 104  CO2 31* 30*  BUN 15 13  CREATININE 1.0 1.0  CALCIUM  --  9.1   Recent Labs    05/03/19 0000 07/12/19 0000  AST 14 13  ALT 8 8  ALKPHOS 81 73  ALBUMIN  --  3.6   Recent Labs    05/03/19 0000 07/12/19 0000  WBC 6.8 6.6  NEUTROABS  --  4,158  HGB 13.9 12.9  HCT 43 40  PLT 263 231   No results found for: TSH No results found for: HGBA1C No results found for: CHOL, HDL, LDLCALC, LDLDIRECT, TRIG, CHOLHDL  Significant Diagnostic Results in last 30 days:  No results found.  Assessment/Plan Dizziness c/o lightheaded and falls asleep right after Lorazepam 0.77m bid started for agitation, Seroquel was increased to 220mqd 07/21/19 then increased to  bid 07/26/19. Also the patient stated she has to follows a line on the floor to walk straight, feels wobbly, like getting drunk after taking Lorazepam. She denied headache, change of vision, chest pain/pressure, palpitation, or focal weakness.  The patient attributed it to Lorazepam use, will dc Lorazepam, observe the patient. May consider EKG, CT head if the patient desires. Update CBC/diff, CMP/eGFR if no better.   Dementia ([place] Mem[place], Donepezil since 2/21  Unsteady gait Feels wobbly, dc Lorazepam, observe.   Agitation No agitation-pacing, packing in evenings  presently, continue Seroquel, dc Lorazepam, observe.      Family/ staff Communication: plan of care reviewed with the patient and charge nurse.   Labs/tests ordered: none  Time spend 40 minutes.

## 2019-08-24 NOTE — Assessment & Plan Note (Signed)
Continue Memantine, Donepezil since 2/21

## 2019-08-24 NOTE — Assessment & Plan Note (Addendum)
c/o lightheaded and falls asleep right after Lorazepam 0.5mg bid started for agitation, Seroquel was increased to 25mg qd 07/21/19 then increased to  bid 07/26/19. Also the patient stated she has to follows a line on the floor to walk straight, feels wobbly, like getting drunk after taking Lorazepam. She denied headache, change of vision, chest pain/pressure, palpitation, or focal weakness.  The patient attributed it to Lorazepam use, will dc Lorazepam, observe the patient. May consider EKG, CT head if the patient desires. Update CBC/diff, CMP/eGFR if no better.  

## 2019-08-24 NOTE — Assessment & Plan Note (Signed)
Feels wobbly, dc Lorazepam, observe.

## 2019-09-15 LAB — HEPATIC FUNCTION PANEL
ALT: 23 (ref 7–35)
AST: 16 (ref 13–35)
Alkaline Phosphatase: 80 (ref 25–125)
Bilirubin, Total: 0.3

## 2019-09-15 LAB — COMPREHENSIVE METABOLIC PANEL
Albumin: 3.5 (ref 3.5–5.0)
Calcium: 9.3 (ref 8.7–10.7)
GFR calc Af Amer: 44
GFR calc non Af Amer: 38
Globulin: 2.7

## 2019-09-15 LAB — CBC: RBC: 4.76 (ref 3.87–5.11)

## 2019-09-15 LAB — CBC AND DIFFERENTIAL
HCT: 42 (ref 36–46)
Hemoglobin: 13.1 (ref 12.0–16.0)
Platelets: 267 (ref 150–399)
WBC: 11.4

## 2019-09-15 LAB — BASIC METABOLIC PANEL
BUN: 50 — AB (ref 4–21)
CO2: 30 — AB (ref 13–22)
Chloride: 100 (ref 99–108)
Creatinine: 1.2 — AB (ref 0.5–1.1)
Glucose: 121
Potassium: 4 (ref 3.4–5.3)
Sodium: 140 (ref 137–147)

## 2019-09-23 ENCOUNTER — Encounter: Payer: Self-pay | Admitting: Internal Medicine

## 2019-09-23 ENCOUNTER — Non-Acute Institutional Stay (SKILLED_NURSING_FACILITY): Payer: Medicare Other | Admitting: Internal Medicine

## 2019-09-23 DIAGNOSIS — E559 Vitamin D deficiency, unspecified: Secondary | ICD-10-CM | POA: Diagnosis not present

## 2019-09-23 DIAGNOSIS — E785 Hyperlipidemia, unspecified: Secondary | ICD-10-CM | POA: Diagnosis not present

## 2019-09-23 DIAGNOSIS — G301 Alzheimer's disease with late onset: Secondary | ICD-10-CM

## 2019-09-23 DIAGNOSIS — F02818 Dementia in other diseases classified elsewhere, unspecified severity, with other behavioral disturbance: Secondary | ICD-10-CM

## 2019-09-23 DIAGNOSIS — F0281 Dementia in other diseases classified elsewhere with behavioral disturbance: Secondary | ICD-10-CM

## 2019-09-23 DIAGNOSIS — R42 Dizziness and giddiness: Secondary | ICD-10-CM

## 2019-09-23 NOTE — Progress Notes (Signed)
Provider:  Einar Crow MD Location:   [facility] Room Number: 103 Place of Service:  SNF ([phone])  PCP: [name], MD Patient Care Team: [name], MD as PCP - General (Internal Medicine)  Extended Emergency Contact Information Primary Emergency Contact: [name] Mobile Phone: [phone] Relation: Relative  Code Status: DNR Goals of Care: Advanced Directive information Advanced Directives 08/24/2019  Does Patient Have a Medical Advance Directive? Yes  Type of Advance Directive Living will;Healthcare Power of [place];Out of facility DNR (pink MOST or yellow form)  Does patient want to make changes to medical advance directive? No - Patient declined  Copy of Healthcare Power of Attorney in Chart? Yes - validated most recent copy scanned in chart (See row information)  Pre-existing out of facility DNR order (yellow form or pink MOST form) -      Chief Complaint  Patient presents with  . New Admit To SNF    Admission    HPI: Patient is a 86 y.o. female seen today for admission to Memory Unit in SNF  Patient is a long-term resident of assisted living Patient has h/odementiano recentimaging, hyperlipidemia, history of unstable gait  Patient was living by herself in [place]. She is not married and does not have any children. Because of progressive dementia and inability for her to take care of herself she moved.with distant relative.And he decided [facility].  Patient was initially admitted to AL. But patient started having behavior issues was started on Seroquel and Ativan.  She did not tolerate Ativan. She would keep back in her bags try to leave the facility.  She states that she has to go back to [place] as she has a house there and she is to file her taxes. It was eventually decided to move her to memory unit for her safety. Patient today was pleasantly confused but had adjusted  to her new room.  She said that she is staying here till this program is done and then she is going to go back to her home in [place]. No complaints walks without any assist.  So far has not been having any issues with nurses Past Medical History:  Diagnosis Date  . Arthritis   . Dementia (HCC)   . Hx of concussion    [employer] [place]  . Hyperlipidemia   . Vitamin D deficiency, unspecified    Past Surgical History:  Procedure Laterality Date  . CHOLECYSTECTOMY      reports that she does not drink alcohol or use drugs. No history on file for tobacco. Social History   Socioeconomic History  . Marital status: Single    Spouse name: Not on file  . Number of children: Not on file  . Years of education: Not on file  . Highest education level: Not on file  Occupational History  . Not on file  Tobacco Use  . Smoking status: Not on file  Substance and Sexual Activity  . Alcohol use: Never  . Drug use: Never  . Sexual activity: Never  Other Topics Concern  . Not on file  Social History Narrative  . Not on file   Social Determinants of Health   Financial Resource Strain:   . Difficulty of Paying Living Expenses:   Food Insecurity:   . Worried About Programme researcher, broadcasting/film/video in the Last Year:   . Barista in the Last Year:   Transportation Needs:   .  Lack of Transportation (Medical):   Marland Kitchen Lack of Transportation (Non-Medical):   Physical Activity:   . Days of Exercise per Week:   . Minutes of Exercise per Session:   Stress:   . Feeling of Stress :   Social Connections:   . Frequency of Communication with Friends and Family:   . Frequency of Social Gatherings with Friends and Family:   . Attends Religious Services:   . Active Member of Clubs or Organizations:   . Attends Banker Meetings:   Marland Kitchen Marital Status:   Intimate Partner Violence:   . Fear of Current or Ex-Partner:   . Emotionally Abused:   Marland Kitchen Physically Abused:   . Sexually Abused:       Functional Status Survey:    History reviewed. No pertinent family history.  Health Maintenance  Topic Date Due  . TETANUS/TDAP  Never done  . DEXA SCAN  Never done  . PNA vac Low Risk Adult (1 of 2 - PCV13) Never done  . INFLUENZA VACCINE  12/25/2019  . COVID-19 Vaccine  Completed    Allergies  Allergen Reactions  . Fish-Derived Products   . Pravachol [Pravastatin Sodium]     Allergies as of 09/23/2019      Reactions   Fish-derived Products    Pravachol [pravastatin Sodium]       Medication List       Accurate as of September 23, 2019 11:59 PM. If you have any questions, ask your nurse or doctor.        donepezil 5 MG tablet Commonly known as: ARICEPT Take 5 mg by mouth at bedtime.   LORazepam 0.5 MG tablet Commonly known as: ATIVAN Take 0.5 mg by mouth every 8 (eight) hours as needed.   memantine 10 MG tablet Commonly known as: NAMENDA Take 10 mg by mouth 2 (two) times daily.   QUEtiapine 25 MG tablet Commonly known as: SEROQUEL Take 25 mg by mouth 2 (two) times daily.   triamcinolone cream 0.1 % Commonly known as: KENALOG Apply 1 application topically. Apply to rash on ankles twice a day until healed.       Review of Systems  Review of Systems  Constitutional: Negative for activity change, appetite change, chills, diaphoresis, fatigue and fever.  HENT: Negative for mouth sores, postnasal drip, rhinorrhea, sinus pain and sore throat.   Respiratory: Negative for apnea, cough, chest tightness, shortness of breath and wheezing.   Cardiovascular: Negative for chest pain, palpitations and leg swelling.  Gastrointestinal: Negative for abdominal distention, abdominal pain, constipation, diarrhea, nausea and vomiting.  Genitourinary: Negative for dysuria and frequency.  Musculoskeletal: Negative for arthralgias, joint swelling and myalgias.  Skin: Negative for rash.  Neurological: Negative for dizziness, syncope, weakness, light-headedness and numbness.   Psychiatric/Behavioral: Negative for behavioral problems, confusion and sleep disturbance.     Vitals:   09/23/19 1443  BP: 116/62  Pulse: 81  Resp: 16  Temp: (!) 97.1 F (36.2 C)  SpO2: 96%  Weight: 153 lb (69.4 kg)  Height: 5\' 6"  (1.676 m)   Body mass index is 24.69 kg/m. Physical Exam  Constitutional:  Well-developed and well-nourished.  HENT:  Head: Normocephalic.  Mouth/Throat: Oropharynx is clear and moist.  Eyes: Pupils are equal, round, and reactive to light.  Neck: Neck supple.  Cardiovascular: Normal rate and normal heart sounds.  No murmur heard. Pulmonary/Chest: Effort normal and breath sounds normal. No respiratory distress. No wheezes. She has no rales.  Abdominal: Soft. Bowel sounds are  normal. No distension. There is no tenderness. There is no rebound.  Musculoskeletal: No edema.  Lymphadenopathy: none Neurological: No Focal Deficits Not Oriented to time or place No Focal deficits. Walk very fast with no assists Skin: Skin is warm and dry.  Psychiatric: Normal mood and affect. Behavior is normal. Thought content normal.    Labs reviewed: Basic Metabolic Panel: Recent Labs    05/03/19 0000 07/12/19 0000  NA 141 141  K 4.5 4.4  CL 102 104  CO2 31* 30*  BUN 15 13  CREATININE 1.0 1.0  CALCIUM  --  9.1   Liver Function Tests: Recent Labs    05/03/19 0000 07/12/19 0000  AST 14 13  ALT 8 8  ALKPHOS 81 73  ALBUMIN  --  3.6   No results for input(s): LIPASE, AMYLASE in the last 8760 hours. No results for input(s): AMMONIA in the last 8760 hours. CBC: Recent Labs    05/03/19 0000 07/12/19 0000  WBC 6.8 6.6  NEUTROABS  --  4,158  HGB 13.9 12.9  HCT 43 40  PLT 263 231   Cardiac Enzymes: No results for input(s): CKTOTAL, CKMB, CKMBINDEX, TROPONINI in the last 8760 hours. BNP: Invalid input(s): POCBNP No results found for: HGBA1C No results found for: TSH Lab Results  Component Value Date   VITAMINB12 466 07/12/2019   No results  found for: FOLATE No results found for: IRON, TIBC, FERRITIN  Imaging and Procedures obtained prior to SNF admission: Patient was never admitted.  Assessment/Plan  Late onset Alzheimer's disease with behavioral disturbance (HCC) Using Ativan as needed for agitation Continue Seroquel twice daily Also on Namenda and Aricept We will consider Depakote if behavior is not controlled  Dizziness Thought to be due to Ativan Much better now Hyperlipidemia, unspecified hyperlipidemia type Has refused to take any medications in the past Is listed allergic to statin  Vitamin D insufficiency Can start her on calcium and vitamin D supplement    Family/ staff Communication:   Labs/tests ordered:

## 2019-10-19 ENCOUNTER — Non-Acute Institutional Stay (SKILLED_NURSING_FACILITY): Payer: Medicare Other | Admitting: Nurse Practitioner

## 2019-10-19 ENCOUNTER — Encounter: Payer: Self-pay | Admitting: Nurse Practitioner

## 2019-10-19 DIAGNOSIS — G301 Alzheimer's disease with late onset: Secondary | ICD-10-CM

## 2019-10-19 DIAGNOSIS — F0281 Dementia in other diseases classified elsewhere with behavioral disturbance: Secondary | ICD-10-CM

## 2019-10-19 DIAGNOSIS — R451 Restlessness and agitation: Secondary | ICD-10-CM

## 2019-10-19 DIAGNOSIS — F02818 Dementia in other diseases classified elsewhere, unspecified severity, with other behavioral disturbance: Secondary | ICD-10-CM

## 2019-10-19 DIAGNOSIS — R2681 Unsteadiness on feet: Secondary | ICD-10-CM

## 2019-10-19 NOTE — Progress Notes (Signed)
Location:   [facility] Room Number: 103-A Place of Service:  SNF (31) Provider:  Constantine Ruddick, NP   Patient Care Team: [name], MD as PCP - General (Internal Medicine)  Extended Emergency Contact Information Primary Emergency Contact: [name] Mobile Phone: [phone] Relation: Relative  Code Status:  DNR Goals of care: Advanced Directive information Advanced Directives 10/19/2019  Does Patient Have a Medical Advance Directive? Yes  Type of Estate agent of [place];Living will;Out of facility DNR (pink MOST or yellow form)  Does patient want to make changes to medical advance directive? No - Patient declined  Copy of Healthcare Power of Attorney in Chart? Yes - validated most recent copy scanned in chart (See row information)  Pre-existing out of facility DNR order (yellow form or pink MOST form) -     Chief Complaint  Patient presents with  . Medical Management of Chronic Issues    Routine Visit.  Marland Kitchen Health Maintenance    Discuss the need for Dexa Scan.  . Immunizations    Discuss the need for Tetanus Vaccine, and PNA Vaccine.     HPI:  Pt is a 86 y.o. female seen today for medical management of chronic diseases.  The patient resides in memory care unit at [facility] for safety, care assistance, on Donepezil 5mg  qd, Memantine 10mg  bid for memory, her mood is stable, on Quetiapine 25mg  bid.    Past Medical History:  Diagnosis Date  . Arthritis   . Dementia (HCC)   . Hx of concussion      . Hyperlipidemia   . Vitamin D deficiency, unspecified    Past Surgical History:  Procedure Laterality Date  . CHOLECYSTECTOMY      Allergies  Allergen Reactions  . Fish-Derived Products   . Pravachol [Pravastatin Sodium]     Allergies as of 10/19/2019      Reactions   Fish-derived Products    Pravachol [pravastatin Sodium]       Medication List       Accurate as of Oct 19, 2019  11:59 PM. If you have any questions, ask your nurse or doctor.        STOP taking these medications   LORazepam 0.5 MG tablet Commonly known as: ATIVAN Stopped by: Dayna Geurts X Nguyet Mercer, NP     TAKE these medications   CALCIUM-VITAMIN D PO Take 1 tablet by mouth daily. 600mg  (1,500mg )   donepezil 5 MG tablet Commonly known as: ARICEPT Take 5 mg by mouth at bedtime.   memantine 10 MG tablet Commonly known as: NAMENDA Take 10 mg by mouth 2 (two) times daily.   QUEtiapine 25 MG tablet Commonly known as: SEROQUEL Take 25 mg by mouth 2 (two) times daily.   triamcinolone cream 0.1 % Commonly known as: KENALOG Apply 1 application topically. Apply to rash on ankles twice a day until healed.       Review of Systems  Constitutional: Negative for fatigue, fever and unexpected weight change.  HENT: Positive for hearing loss. Negative for congestion and voice change.   Eyes: Negative for visual disturbance.  Respiratory: Negative for cough and shortness of breath.   Cardiovascular: Negative for leg swelling.  Gastrointestinal: Negative for abdominal pain and constipation.  Genitourinary: Negative for difficulty urinating, dysuria and urgency.  Musculoskeletal: Positive for gait problem.  Skin: Negative for color change.  Neurological: Negative for syncope, speech difficulty, weakness and light-headedness.       Dementia  Psychiatric/Behavioral: Positive for confusion. Negative for agitation, behavioral problems and sleep disturbance. The patient is not nervous/anxious.     Immunization History  Administered Date(s) Administered  . Influenza-Unspecified 02/26/2019  . Moderna SARS-COVID-2 Vaccination 05/28/2019, 06/25/2019   Pertinent  Health Maintenance Due  Topic Date Due  . DEXA SCAN  Never done  . PNA vac Low Risk Adult (1 of 2 - PCV13) Never done  . INFLUENZA VACCINE  12/25/2019   No flowsheet data found. Functional Status Survey:    Vitals:   10/19/19 1601  BP: 110/60    Pulse: 76  Resp: 20  Temp: 97.7 F (36.5 C)  SpO2: 96%  Weight: 151 lb 6.4 oz (68.7 kg)  Height: 5\' 6"  (1.676 m)   Body mass index is 24.44 kg/m. Physical Exam Vitals and nursing note reviewed.  Constitutional:      Appearance: Normal appearance.  HENT:     Head: Normocephalic and atraumatic.     Mouth/Throat:     Mouth: Mucous membranes are moist.  Eyes:     Extraocular Movements: Extraocular movements intact.     Conjunctiva/sclera: Conjunctivae normal.     Pupils: Pupils are equal, round, and reactive to light.  Cardiovascular:     Rate and Rhythm: Normal rate and regular rhythm.     Heart sounds: No murmur.  Pulmonary:     Breath sounds: No wheezing, rhonchi or rales.  Abdominal:     General: Bowel sounds are normal.     Palpations: Abdomen is soft.     Tenderness: There is no abdominal tenderness.  Musculoskeletal:     Cervical back: Normal range of motion and neck supple.     Right lower leg: No edema.     Left lower leg: No edema.  Skin:    General: Skin is warm and dry.  Neurological:     General: No focal deficit present.     Mental Status: She is alert. Mental status is at baseline.     Gait: Gait abnormal.     Comments: Oriented to person, long term memory about her mom, dad, education, her occupation, home etc seems intact  Psychiatric:        Mood and Affect: Mood normal.        Behavior: Behavior normal.        Thought Content: Thought content normal.     Labs reviewed: Recent Labs    05/03/19 0000 07/12/19 0000 09/15/19 0000  NA 141 141 140  K 4.5 4.4 4.0  CL 102 104 100  CO2 31* 30* 30*  BUN 15 13 50*  CREATININE 1.0 1.0 1.2*  CALCIUM  --  9.1 9.3   Recent Labs    05/03/19 0000 07/12/19 0000 09/15/19 0000  AST 14 13 16   ALT 8 8 23   ALKPHOS 81 73 80  ALBUMIN  --  3.6 3.5   Recent Labs    05/03/19 0000 07/12/19 0000 09/15/19 0000  WBC 6.8 6.6 11.4  NEUTROABS  --  4,158  --   HGB 13.9 12.9 13.1  HCT 43 40 42  PLT 263 231  267   No results found for: TSH No results found for: HGBA1C No results found for: CHOL, HDL, LDLCALC, LDLDIRECT, TRIG, CHOLHDL  Significant Diagnostic Results in last 30 days:  No results found.  Assessment/Plan Dementia (HCC) Continue memory care unit [facility] for safety, care assistance, continue Memantine, Donepezil for memory.   Agitation Adjusting well to memory care unit, continue Quetiapine  25mg  bid  Unsteady gait Gait is better, occasional furniture walker.      Family/ staff Communication: plan of care reviewed with the patient and charge nurse.   Labs/tests ordered:  none  Time spend 25 minutes.

## 2019-10-21 ENCOUNTER — Encounter: Payer: Self-pay | Admitting: Nurse Practitioner

## 2019-10-21 NOTE — Assessment & Plan Note (Signed)
Gait is better, occasional furniture walker.

## 2019-10-21 NOTE — Assessment & Plan Note (Signed)
Adjusting well to memory care unit, continue Quetiapine 25mg  bid

## 2019-10-21 NOTE — Assessment & Plan Note (Signed)
Continue memory care unit [facility] for safety, care assistance, continue Memantine, Donepezil for memory.

## 2019-10-31 ENCOUNTER — Non-Acute Institutional Stay (SKILLED_NURSING_FACILITY): Payer: Medicare Other | Admitting: Nurse Practitioner

## 2019-10-31 ENCOUNTER — Encounter: Payer: Self-pay | Admitting: Nurse Practitioner

## 2019-10-31 DIAGNOSIS — F02818 Dementia in other diseases classified elsewhere, unspecified severity, with other behavioral disturbance: Secondary | ICD-10-CM

## 2019-10-31 DIAGNOSIS — G301 Alzheimer's disease with late onset: Secondary | ICD-10-CM | POA: Diagnosis not present

## 2019-10-31 DIAGNOSIS — R451 Restlessness and agitation: Secondary | ICD-10-CM | POA: Diagnosis not present

## 2019-10-31 DIAGNOSIS — F0281 Dementia in other diseases classified elsewhere with behavioral disturbance: Secondary | ICD-10-CM

## 2019-10-31 DIAGNOSIS — R2681 Unsteadiness on feet: Secondary | ICD-10-CM

## 2019-10-31 NOTE — Progress Notes (Signed)
Location:   [place] Room Number: [place]:  SNF (31) Provider:  Marda Stalker, Lennie Odor NP  [name], MD  Patient Care Team: [name], MD as PCP - General (Internal Medicine)  Extended Emergency Contact Information Primary Emergency Contact: Arnoldo Morale Mobile Phone: [phone] Relation: Relative  Code Status:  DNR Goals of care: Advanced Directive information Advanced Directives 10/31/2019  Does Patient Have a Medical Advance Directive? Yes  Type of Advance Directive Living will;Healthcare Power of Attorney  Does patient want to make changes to medical advance directive? No - Patient declined  Copy of [place] in Chart? Yes - validated most recent copy scanned in chart (See row information)  Pre-existing out of facility DNR order (yellow form or pink MOST form) -     Chief Complaint  Patient presents with  . Acute Visit    Behavior    HPI:  Pt is a 86 y.o. female seen today for an acute visit for behaviors.   The patient resides in [place] [facility] for safety, care assistance, on Memantine 10mg  bid, Deonepezil 5mg  qd for memory. The patient is anxious, confused, sundowning, exit seeking, she is not able to be redirected at times, taking Quetiapine 25mg  bid. Hx of c/o dizziness associated with Lorazepam use.    Past Medical History:  Diagnosis Date  . Arthritis   . Dementia ([place])   . Hx of concussion    [place]  . Hyperlipidemia   . Vitamin D deficiency, unspecified    Past Surgical History:  Procedure Laterality Date  . CHOLECYSTECTOMY      Allergies  Allergen Reactions  . Fish-Derived Products   . Pravachol [Pravastatin Sodium]     Allergies as of 10/31/2019      Reactions   Fish-derived Products    Pravachol [pravastatin Sodium]       Medication List       Accurate as of October 31, 2019 11:59 PM. If you have any questions, ask your nurse or doctor.         CALCIUM-VITAMIN D PO Take 1 tablet by mouth daily. 600mg  (1,500mg ) - 400 unit   donepezil 5 MG tablet Commonly known as: ARICEPT Take 5 mg by mouth at bedtime.   memantine 10 MG tablet Commonly known as: NAMENDA Take 10 mg by mouth 2 (two) times daily.   QUEtiapine 25 MG tablet Commonly known as: SEROQUEL Take 25 mg by mouth 2 (two) times daily.   triamcinolone cream 0.1 % Commonly known as: KENALOG Apply 1 application topically. Apply to rash on ankles twice a day until healed.       Review of Systems  Constitutional: Negative for activity change, appetite change and fever.  HENT: Positive for hearing loss. Negative for congestion and voice change.   Eyes: Negative for visual disturbance.  Respiratory: Negative for cough and shortness of breath.   Cardiovascular: Negative for leg swelling.  Gastrointestinal: Negative for abdominal pain and constipation.  Genitourinary: Negative for difficulty urinating, dysuria and urgency.  Musculoskeletal: Positive for gait problem.  Skin: Negative for color change.  Neurological: Negative for dizziness, speech difficulty, weakness, light-headedness and headaches.       Dementia  Psychiatric/Behavioral: Positive for agitation, behavioral problems and confusion. Negative for sleep disturbance. The patient is not nervous/anxious.     Immunization History  Administered Date(s) Administered  . Influenza-Unspecified 02/26/2019  . Moderna SARS-COVID-2 Vaccination 05/28/2019, 06/25/2019   Pertinent  Health Maintenance Due  Topic Date Due  . DEXA SCAN  Never done  . PNA vac Low Risk Adult (1 of 2 - PCV13) Never done  . INFLUENZA VACCINE  12/25/2019   No flowsheet data found. Functional Status Survey:    Vitals:   10/31/19 0954  BP: 120/80  Pulse: 84  Resp: 20  Temp: 98.3 F (36.8 C)  SpO2: 96%  Weight: 151 lb (68.5 kg)  Height: 5\' 6"  (1.676 m)   Body mass index is 24.37 kg/m. Physical Exam Vitals and nursing note  reviewed.  Constitutional:      Appearance: Normal appearance.  HENT:     Head: Normocephalic and atraumatic.     Mouth/Throat:     Mouth: Mucous membranes are moist.  Eyes:     Extraocular Movements: Extraocular movements intact.     Conjunctiva/sclera: Conjunctivae normal.     Pupils: Pupils are equal, round, and reactive to light.  Cardiovascular:     Rate and Rhythm: Normal rate and regular rhythm.     Heart sounds: No murmur heard.   Pulmonary:     Effort: Pulmonary effort is normal.     Breath sounds: No rales.  Abdominal:     Palpations: Abdomen is soft.     Tenderness: There is no abdominal tenderness.  Musculoskeletal:     Cervical back: Normal range of motion and neck supple.     Right lower leg: No edema.     Left lower leg: No edema.  Skin:    General: Skin is warm and dry.  Neurological:     General: No focal deficit present.     Mental Status: She is alert. Mental status is at baseline.     Gait: Gait abnormal.     Comments: Oriented to person, long term memory about her mom, dad, education, her occupation, home etc seems intact  Psychiatric:     Comments: Pleasantly confused upon my examination.      Labs reviewed: Recent Labs    05/03/19 0000 07/12/19 0000 09/15/19 0000  NA 141 141 140  K 4.5 4.4 4.0  CL 102 104 100  CO2 31* 30* 30*  BUN 15 13 50*  CREATININE 1.0 1.0 1.2*  CALCIUM  --  9.1 9.3   Recent Labs    05/03/19 0000 07/12/19 0000 09/15/19 0000  AST 14 13 16   ALT 8 8 23   ALKPHOS 81 73 80  ALBUMIN  --  3.6 3.5   Recent Labs    05/03/19 0000 07/12/19 0000 09/15/19 0000  WBC 6.8 6.6 11.4  NEUTROABS  --  4,158  --   HGB 13.9 12.9 13.1  HCT 43 40 42  PLT 263 231 267   No results found for: TSH No results found for: HGBA1C No results found for: CHOL, HDL, LDLCALC, LDLDIRECT, TRIG, CHOLHDL  Significant Diagnostic Results in last 30 days:  No results found.  Assessment/Plan Dementia (HCC) Advanced stage, emotional and  behavioral issues at times, continue Memantine, Donepezil, Quetiapine, will continue monitor behaviors/mood, may consider adding Sertraline or Depakote  if needed.   Agitation Continue memory care unit [facility] for safety, care assistance, continue Quetiapine, may consider Depakote or Sertraline if needed.   Unsteady gait Continue ambulating with walker or furniture, close supervision for safety.      Family/ staff Communication: plan of care reviewed with the patient and charge nurse.   Labs/tests ordered:  none  Time spend 25 minutes.

## 2019-11-01 LAB — CBC AND DIFFERENTIAL
HCT: 40 (ref 36–46)
Hemoglobin: 13.4 (ref 12.0–16.0)
Neutrophils Absolute: 4333
Platelets: 217 (ref 150–399)
WBC: 6.9

## 2019-11-01 LAB — COMPREHENSIVE METABOLIC PANEL
Albumin: 3.7 (ref 3.5–5.0)
Calcium: 9.3 (ref 8.7–10.7)
GFR calc Af Amer: 58
GFR calc non Af Amer: 50
Globulin: 2.7

## 2019-11-01 LAB — BASIC METABOLIC PANEL
BUN: 15 (ref 4–21)
CO2: 29 — AB (ref 13–22)
Chloride: 103 (ref 99–108)
Creatinine: 1 (ref 0.5–1.1)
Glucose: 90
Potassium: 4.8 (ref 3.4–5.3)
Sodium: 140 (ref 137–147)

## 2019-11-01 LAB — HEPATIC FUNCTION PANEL
ALT: 7 (ref 7–35)
AST: 13 (ref 13–35)
Alkaline Phosphatase: 86 (ref 25–125)
Bilirubin, Total: 0.4

## 2019-11-01 LAB — CBC: RBC: 4.68 (ref 3.87–5.11)

## 2019-11-07 ENCOUNTER — Encounter: Payer: Self-pay | Admitting: Nurse Practitioner

## 2019-11-07 NOTE — Assessment & Plan Note (Signed)
Continue ambulating with walker or furniture, close supervision for safety.

## 2019-11-07 NOTE — Assessment & Plan Note (Addendum)
Advanced stage, emotional and behavioral issues at times, continue Memantine, Donepezil, Quetiapine, will continue monitor behaviors/mood, may consider adding Sertraline or Depakote  if needed.

## 2019-11-07 NOTE — Assessment & Plan Note (Addendum)
Continue memory care unit [facility] for safety, care assistance, continue Quetiapine, may consider Depakote or Sertraline if needed.

## 2019-11-09 ENCOUNTER — Non-Acute Institutional Stay (SKILLED_NURSING_FACILITY): Payer: Medicare Other | Admitting: Nurse Practitioner

## 2019-11-09 ENCOUNTER — Encounter: Payer: Self-pay | Admitting: Nurse Practitioner

## 2019-11-09 DIAGNOSIS — E559 Vitamin D deficiency, unspecified: Secondary | ICD-10-CM

## 2019-11-09 DIAGNOSIS — H04123 Dry eye syndrome of bilateral lacrimal glands: Secondary | ICD-10-CM

## 2019-11-09 DIAGNOSIS — R42 Dizziness and giddiness: Secondary | ICD-10-CM | POA: Diagnosis not present

## 2019-11-09 DIAGNOSIS — R451 Restlessness and agitation: Secondary | ICD-10-CM

## 2019-11-09 DIAGNOSIS — G301 Alzheimer's disease with late onset: Secondary | ICD-10-CM | POA: Diagnosis not present

## 2019-11-09 DIAGNOSIS — F02818 Dementia in other diseases classified elsewhere, unspecified severity, with other behavioral disturbance: Secondary | ICD-10-CM

## 2019-11-09 DIAGNOSIS — F0281 Dementia in other diseases classified elsewhere with behavioral disturbance: Secondary | ICD-10-CM

## 2019-11-09 NOTE — Assessment & Plan Note (Signed)
Continue SNF [facility] for safety, care assistance, continue Memantine, Donepezil for memory.  

## 2019-11-09 NOTE — Assessment & Plan Note (Signed)
The patient agreed to artificial tears during my examination, but refused later.

## 2019-11-09 NOTE — Assessment & Plan Note (Signed)
Improved

## 2019-11-09 NOTE — Assessment & Plan Note (Signed)
Improved, continue Seroquel, Depakote.

## 2019-11-09 NOTE — Assessment & Plan Note (Signed)
Continue Ca, Vit D supplement, due DEXA

## 2019-11-09 NOTE — Progress Notes (Signed)
Location:   [facility] Room Number: 103 Place of Service:  SNF (31) Provider:  Coumba Kellison, NP   Patient Care Team: [name], MD as PCP - General (Internal Medicine)  Extended Emergency Contact Information Primary Emergency Contact: [name] Mobile Phone: [phone] Relation: Relative  Code Status:  DNR Goals of care: Advanced Directive information Advanced Directives 11/09/2019  Does Patient Have a Medical Advance Directive? Yes  Type of Estate agent of [place];Living will;Out of facility DNR (pink MOST or yellow form)  Does patient want to make changes to medical advance directive? No - Patient declined  Copy of Healthcare Power of Attorney in Chart? Yes - validated most recent copy scanned in chart (See row information)  Pre-existing out of facility DNR order (yellow form or pink MOST form) -     Chief Complaint  Patient presents with  . Medical Management of Chronic Issues    Routine Visit.   Marland Kitchen Health Maintenance    Discuss the need for Dexa Scan.  . Immunizations    Discuss the need for Tetanus Vaccinel, and PNA Vaccine.     HPI:  Pt is a 86 y.o. female seen today for medical management of chronic diseases.      The patient resides in Memory Care Unit [facility] for safety, care assistance, on Memantine 10mg  bid, Donepezil 5mg  qd for memory. Improved symptoms of anxious, confused, sundowning, exit seeking, unable to be redirected at times, taking Quetiapine 25mg  bid, Depakote 250mg  qd since 10/31/19. Vit insufficiency on Ca, Vit D, due for DEXA  Past Medical History:  Diagnosis Date  . Arthritis   . Dementia (HCC)   . Hx of concussion      . Hyperlipidemia   . Vitamin D deficiency, unspecified    Past Surgical History:  Procedure Laterality Date  . CHOLECYSTECTOMY      Allergies  Allergen Reactions  . Fish-Derived Products   . Pravachol [Pravastatin Sodium]      Allergies as of 11/09/2019      Reactions   Fish-derived Products    Pravachol [pravastatin Sodium]       Medication List       Accurate as of November 09, 2019  3:45 PM. If you have any questions, ask your nurse or doctor.        CALCIUM-VITAMIN D PO Take 1 tablet by mouth daily. 600mg  (1,500mg ) - 400 unit   divalproex 250 MG DR tablet Commonly known as: DEPAKOTE Take 250 mg by mouth at bedtime.   donepezil 5 MG tablet Commonly known as: ARICEPT Take 5 mg by mouth at bedtime.   memantine 10 MG tablet Commonly known as: NAMENDA Take 10 mg by mouth 2 (two) times daily.   QUEtiapine 25 MG tablet Commonly known as: SEROQUEL Take 25 mg by mouth 2 (two) times daily.   triamcinolone cream 0.1 % Commonly known as: KENALOG Apply 1 application topically. Apply to rash on ankles twice a day until healed.       Review of Systems  Constitutional: Negative for fatigue, fever and unexpected weight change.  HENT: Positive for hearing loss. Negative for congestion and voice change.   Eyes: Negative for visual disturbance.       Dry eyes  Respiratory: Negative for cough and shortness of breath.   Cardiovascular: Negative for leg swelling.  Gastrointestinal: Negative for abdominal pain and constipation.  Genitourinary: Negative for difficulty urinating, dysuria and urgency.  Musculoskeletal: Positive  for gait problem.  Skin: Negative for color change.  Neurological: Negative for speech difficulty, weakness and light-headedness.       Dementia  Psychiatric/Behavioral: Positive for agitation, behavioral problems and confusion. Negative for sleep disturbance. The patient is not nervous/anxious.        Improved    Immunization History  Administered Date(s) Administered  . Influenza-Unspecified 02/26/2019  . Moderna SARS-COVID-2 Vaccination 05/28/2019, 06/25/2019   Pertinent  Health Maintenance Due  Topic Date Due  . DEXA SCAN  Never done  . PNA vac Low Risk Adult (1 of 2 -  PCV13) Never done  . INFLUENZA VACCINE  12/25/2019   No flowsheet data found. Functional Status Survey:    Vitals:   11/09/19 0853  BP: 132/64  Pulse: 72  Resp: 20  Temp: 98.3 F (36.8 C)  SpO2: 96%  Weight: 151 lb (68.5 kg)  Height: 5\' 6"  (1.676 m)   Body mass index is 24.37 kg/m. Physical Exam Vitals and nursing note reviewed.  Constitutional:      Appearance: Normal appearance. She is normal weight.  HENT:     Head: Normocephalic and atraumatic.     Mouth/Throat:     Mouth: Mucous membranes are moist.  Eyes:     General:        Right eye: No discharge.        Left eye: No discharge.     Extraocular Movements: Extraocular movements intact.     Conjunctiva/sclera: Conjunctivae normal.     Pupils: Pupils are equal, round, and reactive to light.  Cardiovascular:     Rate and Rhythm: Normal rate and regular rhythm.     Heart sounds: No murmur heard.   Pulmonary:     Effort: Pulmonary effort is normal.     Breath sounds: No rales.  Abdominal:     Palpations: Abdomen is soft.     Tenderness: There is no abdominal tenderness.  Musculoskeletal:     Cervical back: Normal range of motion and neck supple.     Right lower leg: No edema.     Left lower leg: No edema.  Skin:    General: Skin is warm and dry.  Neurological:     General: No focal deficit present.     Mental Status: She is alert. Mental status is at baseline.     Gait: Gait abnormal.     Comments: Oriented to person, long term memory about her mom, dad, education, her occupation, home etc seems intact  Psychiatric:     Comments: Pleasantly confused upon my examination.      Labs reviewed: Recent Labs    07/12/19 0000 09/15/19 0000 11/01/19 0000  NA 141 140 140  K 4.4 4.0 4.8  CL 104 100 103  CO2 30* 30* 29*  BUN 13 50* 15  CREATININE 1.0 1.2* 1.0  CALCIUM 9.1 9.3 9.3   Recent Labs    07/12/19 0000 09/15/19 0000 11/01/19 0000  AST 13 16 13   ALT 8 23 7   ALKPHOS 73 80 86  ALBUMIN 3.6  3.5 3.7   Recent Labs    07/12/19 0000 09/15/19 0000 11/01/19 0000  WBC 6.6 11.4 6.9  NEUTROABS 4,158  --  4,333  HGB 12.9 13.1 13.4  HCT 40 42 40  PLT 231 267 217   No results found for: TSH No results found for: HGBA1C No results found for: CHOL, HDL, LDLCALC, LDLDIRECT, TRIG, CHOLHDL  Significant Diagnostic Results in last 30 days:  No  results found.  Assessment/Plan Vitamin D insufficiency Continue Ca, Vit D supplement, due DEXA  Dementia ([place]) Continue SNF [facility] for safety, care assistance, continue Memantine, Donepezil for memory.   Agitation Improved, continue Seroquel, Depakote.   Dizziness Improved.   Dry eyes The patient agreed to artificial tears during my examination, but refused later.      Family/ staff Communication: plan of care reviewed with the patient and charge nurse.   Labs/tests ordered:  none  Time spend 25 minutes.

## 2019-11-15 ENCOUNTER — Non-Acute Institutional Stay (SKILLED_NURSING_FACILITY): Payer: Medicare Other | Admitting: Internal Medicine

## 2019-11-15 ENCOUNTER — Encounter: Payer: Self-pay | Admitting: Internal Medicine

## 2019-11-15 DIAGNOSIS — E785 Hyperlipidemia, unspecified: Secondary | ICD-10-CM

## 2019-11-15 DIAGNOSIS — R451 Restlessness and agitation: Secondary | ICD-10-CM

## 2019-11-15 DIAGNOSIS — G301 Alzheimer's disease with late onset: Secondary | ICD-10-CM

## 2019-11-15 DIAGNOSIS — E559 Vitamin D deficiency, unspecified: Secondary | ICD-10-CM

## 2019-11-15 DIAGNOSIS — F0281 Dementia in other diseases classified elsewhere with behavioral disturbance: Secondary | ICD-10-CM

## 2019-11-15 NOTE — Progress Notes (Signed)
Location:   [place] Room Number: [place]:  SNF (31) Provider:Quantez Schnyder, Meredith Staggers MD    [name], MD  Patient Care Team: [name], MD as PCP - General (Internal Medicine)  Extended Emergency Contact Information Primary Emergency Contact: Arnoldo Morale Mobile Phone: [phone] Relation: Relative  Code Status: DNR  Goals of care: Advanced Directive information Advanced Directives 11/09/2019  Does Patient Have a Medical Advance Directive? Yes  Type of Paramedic of [place];Living will;Out of facility DNR (pink MOST or yellow form)  Does patient want to make changes to medical advance directive? No - Patient declined  Copy of [place] in Chart? Yes - validated most recent copy scanned in chart (See row information)  Pre-existing out of facility DNR order (yellow form or pink MOST form) -     Chief Complaint  Patient presents with  . Acute Visit    Behavior issues    HPI:  Pt is a 86 y.o. female seen today for an acute visit for behavior Issues Patient is a long-term resident of assisted living Patient has h/odementiano recentimaging, hyperlipidemia, history of unstable gait  Patient was living by herself in [place]. She is not married and does not have any children. Because of progressive dementia and inability for her to take care of herself she moved.with distant relative.And he decided to move her to [facility] home community.  She is now in Memory unit Patient continues to have behavior issues.  She called her POA 14 times a day.  Today she had on her stuff back and she keeps saying she has to go back to [place] to file taxes and take care of things. Patient does not have any other complaints she is walks without any assist has not had any falls no fever or chills.  Past Medical History:  Diagnosis Date  . Arthritis   . Dementia ([place])   . Hx of  concussion    [place]  . Hyperlipidemia   . Vitamin D deficiency, unspecified    Past Surgical History:  Procedure Laterality Date  . CHOLECYSTECTOMY      Allergies  Allergen Reactions  . Fish-Derived Products   . Pravachol [Pravastatin Sodium]     Allergies as of 11/15/2019      Reactions   Fish-derived Products    Pravachol [pravastatin Sodium]       Medication List       Accurate as of November 15, 2019 11:39 AM. If you have any questions, ask your nurse or doctor.        CALCIUM-VITAMIN D PO Take 1 tablet by mouth daily. 600mg  (1,500mg ) - 400 unit   divalproex 250 MG DR tablet Commonly known as: DEPAKOTE Take 250 mg by mouth at bedtime.   donepezil 5 MG tablet Commonly known as: ARICEPT Take 5 mg by mouth at bedtime.   memantine 10 MG tablet Commonly known as: NAMENDA Take 10 mg by mouth 2 (two) times daily.   POLYVINYL ALCOHOL-POVIDONE OP Apply to eye. drops; 0.5-0.6 %; amt: 2 drops each eye; ophthalmic (eye) Three Times A Day   QUEtiapine 25 MG tablet Commonly known as: SEROQUEL Take 25 mg by mouth 2 (two) times daily.   triamcinolone cream 0.1 % Commonly known as: KENALOG Apply 1 application topically. Apply to rash on ankles twice a day until healed.       Review of Systems  Review of Systems  Constitutional: Negative for activity change, appetite change, chills, diaphoresis, fatigue and fever.  HENT: Negative for mouth sores, postnasal drip, rhinorrhea, sinus pain and sore throat.   Respiratory: Negative for apnea, cough, chest tightness, shortness of breath and wheezing.   Cardiovascular: Negative for chest pain, palpitations and leg swelling.  Gastrointestinal: Negative for abdominal distention, abdominal pain, constipation, diarrhea, nausea and vomiting.  Genitourinary: Negative for dysuria and frequency.  Musculoskeletal: Negative for arthralgias, joint swelling and myalgias.  Skin: Negative for rash.  Neurological:  Negative for dizziness, syncope, weakness, light-headedness and numbness.  Psychiatric/Behavioral: Positive  for behavioral problems, confusion and sleep disturbance.     Immunization History  Administered Date(s) Administered  . Influenza-Unspecified 02/26/2019  . Moderna SARS-COVID-2 Vaccination 05/28/2019, 06/25/2019   Pertinent  Health Maintenance Due  Topic Date Due  . DEXA SCAN  Never done  . PNA vac Low Risk Adult (1 of 2 - PCV13) Never done  . INFLUENZA VACCINE  12/25/2019   No flowsheet data found. Functional Status Survey:    Vitals:   11/15/19 1133  BP: 108/60  Pulse: 88  Resp: 20  Temp: 98.3 F (36.8 C)  SpO2: 96%  Weight: 151 lb (68.5 kg)  Height: 5\' 6"  (1.676 m)   Body mass index is 24.37 kg/m. Physical Exam  Constitutional:  Well-developed and well-nourished.  HENT:  Head: Normocephalic.  Mouth/Throat: Oropharynx is clear and moist.  Eyes: Pupils are equal, round, and reactive to light.  Neck: Neck supple.  Cardiovascular: Normal rate and normal heart sounds.  No murmur heard. Pulmonary/Chest: Effort normal and breath sounds normal. No respiratory distress. No wheezes. She has no rales.  Abdominal: Soft. Bowel sounds are normal. No distension. There is no tenderness. There is no rebound.  Musculoskeletal: No edema.  Lymphadenopathy: none Neurological: Knows her age Does not know Name of Place Thinks she is visiting here and needs to get back to for her Home.  No Focal Deficits Skin: Skin is warm and dry.  Psychiatric: Normal mood and affect. Behavior is normal. Thought content normal.    Labs reviewed: Recent Labs    07/12/19 0000 09/15/19 0000 11/01/19 0000  NA 141 140 140  K 4.4 4.0 4.8  CL 104 100 103  CO2 30* 30* 29*  BUN 13 50* 15  CREATININE 1.0 1.2* 1.0  CALCIUM 9.1 9.3 9.3   Recent Labs    07/12/19 0000 09/15/19 0000 11/01/19 0000  AST 13 16 13   ALT 8 23 7   ALKPHOS 73 80 86  ALBUMIN 3.6 3.5 3.7   Recent Labs      07/12/19 0000 09/15/19 0000 11/01/19 0000  WBC 6.6 11.4 6.9  NEUTROABS 4,158  --  4,333  HGB 12.9 13.1 13.4  HCT 40 42 40  PLT 231 267 217   No results found for: TSH No results found for: HGBA1C No results found for: CHOL, HDL, LDLCALC, LDLDIRECT, TRIG, CHOLHDL  Significant Diagnostic Results in last 30 days:  No results found.  Assessment/Plan Late onset Alzheimer's disease with behavioral disturbance (HCC) Increase Aricept to 10 mg  Seroquel 25 mg twice daily Start Depakote 125 mg in the morning continue  250 at night Ativan 0.5 mg every 8 hours as needed for 14 days  Hyperlipidemia, unspecified hyperlipidemia type Has refused to take any medications in the past Is listed allergic to statin  Vitamin D insufficiency On  calcium and vitamin D supplement Family/ staff Communication:   Labs/tests ordered:

## 2019-11-29 ENCOUNTER — Encounter: Payer: Self-pay | Admitting: Internal Medicine

## 2019-11-29 ENCOUNTER — Non-Acute Institutional Stay (SKILLED_NURSING_FACILITY): Payer: Medicare Other | Admitting: Internal Medicine

## 2019-11-29 DIAGNOSIS — G301 Alzheimer's disease with late onset: Secondary | ICD-10-CM | POA: Diagnosis not present

## 2019-11-29 DIAGNOSIS — E559 Vitamin D deficiency, unspecified: Secondary | ICD-10-CM | POA: Diagnosis not present

## 2019-11-29 DIAGNOSIS — E785 Hyperlipidemia, unspecified: Secondary | ICD-10-CM | POA: Diagnosis not present

## 2019-11-29 DIAGNOSIS — F02818 Dementia in other diseases classified elsewhere, unspecified severity, with other behavioral disturbance: Secondary | ICD-10-CM

## 2019-11-29 DIAGNOSIS — F0281 Dementia in other diseases classified elsewhere with behavioral disturbance: Secondary | ICD-10-CM

## 2019-11-29 NOTE — Progress Notes (Signed)
Location:   [facility] Room Number: 103 Place of Service:  SNF [phone]) Provider:  Einar Crow MD   [name], MD  Patient Care Team: [name], MD as PCP - General (Internal Medicine)  Extended Emergency Contact Information Primary Emergency Contact: [name] Mobile Phone: [phone] Relation: Relative  Code Status:  DNR Goals of care: Advanced Directive information Advanced Directives 11/09/2019  Does Patient Have a Medical Advance Directive? Yes  Type of Estate agent of [place];Living will;Out of facility DNR (pink MOST or yellow form)  Does patient want to make changes to medical advance directive? No - Patient declined  Copy of Healthcare Power of Attorney in Chart? Yes - validated most recent copy scanned in chart (See row information)  Pre-existing out of facility DNR order (yellow form or pink MOST form) -     Chief Complaint  Patient presents with  . Acute Visit    Follow up of Behaviors      HPI:  Pt is a 86 y.o. female seen today for an acute visit for Follow up of her Behavior Issues   Patient is a long-term resident of assisted living Patient has h/odementiano recentimaging, hyperlipidemia, history of unstable gait  Patient was living by herself in [place]. She is not married and does not have any children. Because of progressive dementia and inability for her to take care of herself she moved.with distant relative.And he decided to move her to [facility] home community. She is now in Memory unit  Patient was seen few weeks ago because of having severe behavior issues.  Including trying to exit the facility calling her POA almost 15 times a day.  Trying to leave for [place]. We had increased the Aricept to 10 mg.  Started on Depakote 125 in the morning continue to 250 at night. And per suggestion of the facility psych nurse increase the Depakote to 250 twice  daily. Since then patient has been more quieter.  Has stopped calling her POA.  But the last few days she has been complaining that she is more tired and  weak and is sleeping all the time. This was her main complaint today she said all these medications are making her sleepy. Has not had any fever chills cough or any falls.  Past Medical History:  Diagnosis Date  . Arthritis   . Dementia (HCC)   . Hx of concussion    [facility] [place]  . Hyperlipidemia   . Vitamin D deficiency, unspecified    Past Surgical History:  Procedure Laterality Date  . CHOLECYSTECTOMY      Allergies  Allergen Reactions  . Fish-Derived Products   . Pravachol [Pravastatin Sodium]     Allergies as of 11/29/2019      Reactions   Fish-derived Products    Pravachol [pravastatin Sodium]       Medication List       Accurate as of November 29, 2019  2:24 PM. If you have any questions, ask your nurse or doctor.        CALCIUM-VITAMIN D PO Take 1 tablet by mouth in the morning and at bedtime. 600mg  (1,500mg ) - 400 unit   divalproex 250 MG DR tablet Commonly known as: DEPAKOTE Take 250 mg by mouth at bedtime.   donepezil 10 MG tablet Commonly known as: ARICEPT Take 10 mg by mouth at bedtime.   memantine 10 MG tablet Commonly known as: NAMENDA Take 10 mg by  mouth 2 (two) times daily.   POLYVINYL ALCOHOL-POVIDONE OP Apply to eye. drops; 0.5-0.6 %; amt: 2 drops each eye; ophthalmic (eye) Three Times A Day   QUEtiapine 25 MG tablet Commonly known as: SEROQUEL Take 25 mg by mouth 2 (two) times daily.   triamcinolone cream 0.1 % Commonly known as: KENALOG Apply 1 application topically. Apply to rash on ankles twice a day until healed.       Review of Systems  Constitutional: Positive for activity change.  HENT: Negative.   Respiratory: Negative.   Cardiovascular: Negative.   Gastrointestinal: Negative.   Genitourinary: Negative.   Musculoskeletal: Negative.   Skin: Negative.    Neurological: Positive for weakness.  Psychiatric/Behavioral: Negative.     Immunization History  Administered Date(s) Administered  . Influenza-Unspecified 02/26/2019  . Moderna SARS-COVID-2 Vaccination 05/28/2019, 06/25/2019   Pertinent  Health Maintenance Due  Topic Date Due  . DEXA SCAN  Never done  . PNA vac Low Risk Adult (1 of 2 - PCV13) Never done  . INFLUENZA VACCINE  12/25/2019   No flowsheet data found. Functional Status Survey:    Vitals:   11/29/19 1420  BP: 110/60  Pulse: 82  Resp: 20  Temp: 99 F (37.2 C)  SpO2: 94%  Weight: 152 lb 6.4 oz (69.1 kg)  Height: 5\' 6"  (1.676 m)   Body mass index is 24.6 kg/m. Physical Exam Constitutional: . Well-developed and well-nourished.  HENT:  Head: Normocephalic.  Mouth/Throat: Oropharynx is clear and moist.  Eyes: Pupils are equal, round, and reactive to light.  Neck: Neck supple.  Cardiovascular: Normal rate and normal heart sounds.  No murmur heard. Pulmonary/Chest: Effort normal and breath sounds normal. No respiratory distress. No wheezes. She has no rales.  Abdominal: Soft. Bowel sounds are normal. No distension. There is no tenderness. There is no rebound.  Musculoskeletal: No edema.  Lymphadenopathy: none Neurological: Patient was sitting quietly in her room.  This is unusual for her.  No focal deficit.  Skin: Skin is warm and dry.  Psychiatric: Normal mood and affect. Behavior is normal. Thought content normal.   Labs reviewed: Recent Labs    07/12/19 0000 09/15/19 0000 11/01/19 0000  NA 141 140 140  K 4.4 4.0 4.8  CL 104 100 103  CO2 30* 30* 29*  BUN 13 50* 15  CREATININE 1.0 1.2* 1.0  CALCIUM 9.1 9.3 9.3   Recent Labs    07/12/19 0000 09/15/19 0000 11/01/19 0000  AST 13 16 13   ALT 8 23 7   ALKPHOS 73 80 86  ALBUMIN 3.6 3.5 3.7   Recent Labs    07/12/19 0000 09/15/19 0000 11/01/19 0000  WBC 6.6 11.4 6.9  NEUTROABS 4,158  --  4,333  HGB 12.9 13.1 13.4  HCT 40 42 40  PLT 231  267 217   No results found for: TSH No results found for: HGBA1C No results found for: CHOL, HDL, LDLCALC, LDLDIRECT, TRIG, CHOLHDL  Significant Diagnostic Results in last 30 days:  No results found.  Assessment/Plan Late onset Alzheimer's disease with behavioral disturbance (HCC) We will continue on Aricept and Namenda Also will continue on Depakote 250 twice daily Repeat labs pending We will decrease the Seroquel to 12.5 mg twice daily. Would like to eventually taper her off this. The nurses are also keeping her on the chart on her  Hyperlipidemia, unspecified hyperlipidemia type Has refused to take any medications in the past Is listed allergic to statin  Vitamin D insufficiency On  calcium and vitamin D supplement   Family/ staff Communication:   Labs/tests ordered:

## 2019-12-09 ENCOUNTER — Other Ambulatory Visit: Payer: Self-pay | Admitting: Internal Medicine

## 2019-12-09 MED ORDER — LORAZEPAM 0.5 MG PO TABS
0.5000 mg | ORAL_TABLET | Freq: Two times a day (BID) | ORAL | 0 refills | Status: AC | PRN
Start: 2019-12-09 — End: 2019-12-24

## 2019-12-27 LAB — BASIC METABOLIC PANEL
BUN: 13 (ref 4–21)
CO2: 28 — AB (ref 13–22)
Chloride: 97 — AB (ref 99–108)
Creatinine: 0.9 (ref 0.5–1.1)
Glucose: 83
Potassium: 4.1 (ref 3.4–5.3)
Sodium: 134 — AB (ref 137–147)

## 2019-12-27 LAB — HEPATIC FUNCTION PANEL
ALT: 6 — AB (ref 7–35)
AST: 12 — AB (ref 13–35)
Alkaline Phosphatase: 58 (ref 25–125)
Bilirubin, Total: 0.4

## 2019-12-27 LAB — COMPREHENSIVE METABOLIC PANEL
Albumin: 3.5 (ref 3.5–5.0)
Calcium: 8.8 (ref 8.7–10.7)
Globulin: 2.7

## 2019-12-27 LAB — CBC AND DIFFERENTIAL
HCT: 37 (ref 36–46)
Hemoglobin: 11.9 — AB (ref 12.0–16.0)
WBC: 6.1

## 2019-12-27 LAB — CBC: RBC: 4.15 (ref 3.87–5.11)

## 2019-12-28 ENCOUNTER — Encounter: Payer: Self-pay | Admitting: Nurse Practitioner

## 2019-12-28 ENCOUNTER — Non-Acute Institutional Stay (SKILLED_NURSING_FACILITY): Payer: Medicare Other | Admitting: Nurse Practitioner

## 2019-12-28 DIAGNOSIS — F02818 Dementia in other diseases classified elsewhere, unspecified severity, with other behavioral disturbance: Secondary | ICD-10-CM

## 2019-12-28 DIAGNOSIS — W19XXXA Unspecified fall, initial encounter: Secondary | ICD-10-CM

## 2019-12-28 DIAGNOSIS — R2681 Unsteadiness on feet: Secondary | ICD-10-CM

## 2019-12-28 DIAGNOSIS — R451 Restlessness and agitation: Secondary | ICD-10-CM

## 2019-12-28 DIAGNOSIS — F0281 Dementia in other diseases classified elsewhere with behavioral disturbance: Secondary | ICD-10-CM

## 2019-12-28 DIAGNOSIS — G301 Alzheimer's disease with late onset: Secondary | ICD-10-CM

## 2019-12-28 DIAGNOSIS — S5011XA Contusion of right forearm, initial encounter: Secondary | ICD-10-CM | POA: Insufficient documentation

## 2019-12-28 NOTE — Assessment & Plan Note (Signed)
No focal neurological symptoms since the fall/hematoma of the right forehead, no s/so of bleeding or infection of the hematoma. It should heal, observe.

## 2019-12-28 NOTE — Assessment & Plan Note (Signed)
Lack of safety awareness, restlessness, increased frailty are contributory, continue close supervision/assistance for safety, better mood control is must.

## 2019-12-28 NOTE — Assessment & Plan Note (Signed)
Packing, exit seeking, restlessness, not sleeping at night. Unable to be redirected, continue Donepezil, Memantine for memory. Will resume Depakote 250mg  bid/125mg  bid(DGR 12/06/19), continue Quetiapine 12.5mg  bid since 11/29/19(GDR from 25mg  bid), dc Lorazepam-no efficacy, risk for falling.

## 2019-12-28 NOTE — Assessment & Plan Note (Signed)
Unsteady at times, close supervision needed.

## 2019-12-28 NOTE — Assessment & Plan Note (Addendum)
Packing, exit seeking, restlessness, not sleeping at night. Unable to be redirected. Will try Depakote 250mg  q8hr, continue Quetiapine 12.5mg  bid since 11/29/19(GDR from 25mg  bid), dc Lorazepam-no efficacy, risk for falling, used at least once in the past 4-5 days.

## 2019-12-28 NOTE — Progress Notes (Signed)
Location:   SNF [place] Room Number: 643 PIRJJ of Service:  SNF (31) Provider: [facility] Randilyn Foisy NP  [name], MD  Patient Care Team: [name], MD as PCP - General (Internal Medicine)  Extended Emergency Contact Information Primary Emergency Contact: Arnoldo Morale Mobile Phone: [phone] Relation: Relative  Code Status: DNR Goals of care: Advanced Directive information Advanced Directives 12/28/2019  Does Patient Have a Medical Advance Directive? Yes  Type of Paramedic of [place];Living will  Does patient want to make changes to medical advance directive? No - Patient declined  Copy of [place] in Chart? Yes - validated most recent copy scanned in chart (See row information)  Pre-existing out of facility DNR order (yellow form or pink MOST form) -     Chief Complaint  Patient presents with   Medical Management of Chronic Issues    Routine follow up visit   Best Practice Recommendations    Tetanus/TDAP, Pneumonia vaccine, Flu vaccine   Quality Metric Gaps    DEXA scan    HPI:  Pt is a 86 y.o. female seen today for an acute visit for fell 12/26/19, resulted a right forehead hematoma/upper eye lid ecchyosis The patient stated she tripped over the bag she  was packing  to leave in the morning, hit her head on the floor at midnight. The patient is not sleeping at night, seeking for exit, requesting phone book to call cab, unable to be redirected, prn Lorazepam not effective. Seroquel was decreased 12.18m/25mg bid 11/29/19, Depakote 2564mbid since  12/06/19  Dementia, takes Memantine, Donepezil    Past Medical History:  Diagnosis Date   Arthritis    Dementia ([place]   Hx of concussion    [place] Hyperlipidemia    Vitamin D deficiency, unspecified    Past Surgical History:  Procedure Laterality Date   CHOLECYSTECTOMY      Allergies  Allergen Reactions   Fish-Derived  Products    Pravachol [Pravastatin Sodium]     Allergies as of 12/28/2019      Reactions   Fish-derived Products    Pravachol [pravastatin Sodium]       Medication List       Accurate as of December 28, 2019  4:23 PM. If you have any questions, ask your nurse or doctor.        CALCIUM-VITAMIN D PO Take 1 tablet by mouth in the morning and at bedtime. 60013m1,500m43m 400 unit   divalproex 250 MG DR tablet Commonly known as: DEPAKOTE Take 250 mg by mouth at bedtime.   donepezil 10 MG tablet Commonly known as: ARICEPT Take 10 mg by mouth at bedtime.   memantine 10 MG tablet Commonly known as: NAMENDA Take 10 mg by mouth 2 (two) times daily.   POLYVINYL ALCOHOL-POVIDONE OP Apply to eye. drops; 0.5-0.6 %; amt: 2 drops each eye; ophthalmic (eye) Three Times A Day   QUEtiapine 25 MG tablet Commonly known as: SEROQUEL Take 12.5 mg by mouth 2 (two) times daily.   triamcinolone cream 0.1 % Commonly known as: KENALOG Apply 1 application topically. Apply to rash on ankles twice a day until healed.       Review of Systems  Constitutional: Negative for appetite change, fatigue and fever.  HENT: Positive for hearing loss. Negative for congestion and voice change.   Eyes: Negative for visual disturbance.       Dry eyes  Respiratory:  Negative for cough and shortness of breath.   Cardiovascular: Negative for leg swelling.  Gastrointestinal: Negative for abdominal pain and constipation.  Genitourinary: Negative for difficulty urinating, dysuria and urgency.  Musculoskeletal: Positive for gait problem.  Skin: Negative for color change.       Right forehead hematoma  Neurological: Negative for speech difficulty, weakness, light-headedness and headaches.       Dementia  Psychiatric/Behavioral: Positive for agitation, behavioral problems, confusion and sleep disturbance. The patient is nervous/anxious.        Worsening restlessness, packing, exit seeking, not sleeping     Immunization History  Administered Date(s) Administered   Influenza-Unspecified 02/26/2019   Moderna SARS-COVID-2 Vaccination 05/28/2019, 06/25/2019   Pertinent  Health Maintenance Due  Topic Date Due   DEXA SCAN  Never done   PNA vac Low Risk Adult (1 of 2 - PCV13) Never done   INFLUENZA VACCINE  12/25/2019   No flowsheet data found. Functional Status Survey:    Vitals:   12/28/19 1608  BP: 110/60  Pulse: 80  Resp: 20  Temp: 98 F (36.7 C)  SpO2: 93%  Weight: 152 lb (68.9 kg)  Height: '5\' 6"'  (1.676 m)   Body mass index is 24.53 kg/m. Physical Exam Vitals and nursing note reviewed.  Constitutional:      Appearance: Normal appearance.  HENT:     Head: Normocephalic and atraumatic.     Mouth/Throat:     Mouth: Mucous membranes are moist.  Eyes:     Extraocular Movements: Extraocular movements intact.     Conjunctiva/sclera: Conjunctivae normal.     Pupils: Pupils are equal, round, and reactive to light.  Cardiovascular:     Rate and Rhythm: Normal rate and regular rhythm.     Heart sounds: No murmur heard.   Pulmonary:     Effort: Pulmonary effort is normal.     Breath sounds: No rales.  Abdominal:     Palpations: Abdomen is soft.     Tenderness: There is no abdominal tenderness.  Musculoskeletal:     Cervical back: Normal range of motion and neck supple.     Right lower leg: No edema.     Left lower leg: No edema.  Skin:    General: Skin is warm and dry.     Comments: Right forehead hematoma, right upper eyelid ecchymosis.   Neurological:     General: No focal deficit present.     Mental Status: She is alert. Mental status is at baseline.     Gait: Gait abnormal.     Comments: Oriented to person, long term memory about her mom, dad, education, her occupation, home etc seems intact  Psychiatric:     Comments: Confused.      Labs reviewed: Recent Labs    07/12/19 0000 09/15/19 0000 11/01/19 0000  NA 141 140 140  K 4.4 4.0 4.8  CL 104  100 103  CO2 30* 30* 29*  BUN 13 50* 15  CREATININE 1.0 1.2* 1.0  CALCIUM 9.1 9.3 9.3   Recent Labs    07/12/19 0000 09/15/19 0000 11/01/19 0000  AST '13 16 13  ' ALT '8 23 7  ' ALKPHOS 73 80 86  ALBUMIN 3.6 3.5 3.7   Recent Labs    07/12/19 0000 09/15/19 0000 11/01/19 0000  WBC 6.6 11.4 6.9  NEUTROABS 4,158  --  4,333  HGB 12.9 13.1 13.4  HCT 40 42 40  PLT 231 267 217   No results found for: TSH  No results found for: HGBA1C No results found for: CHOL, HDL, LDLCALC, LDLDIRECT, TRIG, CHOLHDL  Significant Diagnostic Results in last 30 days:  No results found.  Assessment/Plan: Fall Lack of safety awareness, restlessness, increased frailty are contributory, continue close supervision/assistance for safety, better mood control is must.   Traumatic hematoma of right forearm No focal neurological symptoms since the fall/hematoma of the right forehead, no s/so of bleeding or infection of the hematoma. It should heal, observe.   Unsteady gait Unsteady at times, close supervision needed.   Dementia ([place]) Packing, exit seeking, restlessness, not sleeping at night. Unable to be redirected, continue Donepezil, Memantine for memory. Will resume Depakote 234m bid/121043mbid(DGR 12/06/19), continue Quetiapine 12.43m43mid since 11/29/19(GDR from 243m37md), dc Lorazepam-no efficacy, risk for falling.   Agitation Packing, exit seeking, restlessness, not sleeping at night. Unable to be redirected. Will try Depakote 250mg53mr, continue Quetiapine 12.43mg b82msince 11/29/19(GDR from 243mg b59m dc Lorazepam-no efficacy, risk for falling, used at least once in the past 4-5 days.      Family/ staff Communication: plan of care reviewed with the patient and charge nurse.   Labs/tests ordered: CBC/diff, CMP/eGFR done 12/27/19  Time spend 35 minutes.

## 2020-01-05 ENCOUNTER — Non-Acute Institutional Stay (SKILLED_NURSING_FACILITY): Payer: Medicare Other | Admitting: Nurse Practitioner

## 2020-01-05 ENCOUNTER — Encounter: Payer: Self-pay | Admitting: Nurse Practitioner

## 2020-01-05 DIAGNOSIS — F02818 Dementia in other diseases classified elsewhere, unspecified severity, with other behavioral disturbance: Secondary | ICD-10-CM

## 2020-01-05 DIAGNOSIS — G301 Alzheimer's disease with late onset: Secondary | ICD-10-CM | POA: Diagnosis not present

## 2020-01-05 DIAGNOSIS — F418 Other specified anxiety disorders: Secondary | ICD-10-CM | POA: Diagnosis not present

## 2020-01-05 DIAGNOSIS — F0281 Dementia in other diseases classified elsewhere with behavioral disturbance: Secondary | ICD-10-CM

## 2020-01-05 DIAGNOSIS — R451 Restlessness and agitation: Secondary | ICD-10-CM | POA: Diagnosis not present

## 2020-01-05 NOTE — Assessment & Plan Note (Signed)
Seems better, continue Seroquel, Depakote

## 2020-01-05 NOTE — Progress Notes (Addendum)
Location:   [facility] Room Number: 103 Place of Service:  SNF (31) Provider: Arna Snipe Avry Monteleone NP  [name], MD  Patient Care Team: [name], MD as PCP - General (Internal Medicine)  Extended Emergency Contact Information Primary Emergency Contact: [name] Mobile Phone: [phone] Relation: Relative  Code Status: DNR Goals of care: Advanced Directive information Advanced Directives 12/28/2019  Does Patient Have a Medical Advance Directive? Yes  Type of Estate agent of [place];Living will  Does patient want to make changes to medical advance directive? No - Patient declined  Copy of Healthcare Power of Attorney in Chart? Yes - validated most recent copy scanned in chart (See row information)  Pre-existing out of facility DNR order (yellow form or pink MOST form) -     Chief Complaint  Patient presents with  . Acute Visit    seeking exit behavior    HPI:  Pt is a 86 y.o. female seen today for an acute visit for HPOA's concerns about the patient's mood, agitation, medication refusal, pending Psych consult 01/26/20 per social service.   Depression/anxiety, prn Lorazepam was not effective. Seroquel was decreased 12.5mg /25mg  bid 11/29/19, Depakote 250mg  q8hr since 12/28/19  Dementia, resides in memory care unit, exit seeking behaviors, takes Memantine, Donepezil.    Past Medical History:  Diagnosis Date  . Arthritis   . Dementia (HCC)   . Hx of concussion    02/27/20 [employer]  . Hyperlipidemia   . Vitamin D deficiency, unspecified    Past Surgical History:  Procedure Laterality Date  . CHOLECYSTECTOMY      Allergies  Allergen Reactions  . Fish-Derived Products   . Pravachol [Pravastatin Sodium]     Allergies as of 01/05/2020      Reactions   Fish-derived Products    Pravachol [pravastatin Sodium]       Medication List       Accurate as of January 05, 2020 11:59 PM. If you have any  questions, ask your nurse or doctor.        CALCIUM-VITAMIN D PO Take 1 tablet by mouth in the morning and at bedtime. 600mg  (1,500mg ) - 400 unit   divalproex 250 MG DR tablet Commonly known as: DEPAKOTE Take 250 mg by mouth at bedtime.   donepezil 10 MG tablet Commonly known as: ARICEPT Take 10 mg by mouth at bedtime.   LORazepam 0.5 MG tablet Commonly known as: ATIVAN Take 0.5 mg by mouth 2 (two) times daily as needed for anxiety.   memantine 10 MG tablet Commonly known as: NAMENDA Take 10 mg by mouth 2 (two) times daily.   POLYVINYL ALCOHOL-POVIDONE OP Apply to eye. drops; 0.5-0.6 %; amt: 2 drops each eye; ophthalmic (eye) Three Times A Day   QUEtiapine 25 MG tablet Commonly known as: SEROQUEL Take 12.5 mg by mouth 2 (two) times daily.   triamcinolone cream 0.1 % Commonly known as: KENALOG Apply 1 application topically. Apply to rash on ankles twice a day until healed.       Review of Systems  Constitutional: Negative for appetite change, fatigue and fever.  HENT: Positive for hearing loss. Negative for congestion and voice change.   Eyes: Negative for visual disturbance.       Dry eyes  Respiratory: Negative for cough and shortness of breath.   Cardiovascular: Negative for leg swelling.  Gastrointestinal: Negative for abdominal pain and constipation.  Genitourinary: Negative for dysuria and urgency.  Musculoskeletal: Positive for  gait problem.  Skin: Negative for color change.       Right forehead hematoma, healing.   Neurological: Negative for dizziness, facial asymmetry, speech difficulty and weakness.       Dementia  Psychiatric/Behavioral: Positive for agitation, behavioral problems, confusion and sleep disturbance. The patient is nervous/anxious.        Worsening restlessness, packing, exit seeking, not sleeping    Immunization History  Administered Date(s) Administered  . Influenza-Unspecified 02/26/2019  . Moderna SARS-COVID-2 Vaccination  05/28/2019, 06/25/2019   Pertinent  Health Maintenance Due  Topic Date Due  . DEXA SCAN  Never done  . PNA vac Low Risk Adult (1 of 2 - PCV13) Never done  . INFLUENZA VACCINE  12/25/2019   No flowsheet data found. Functional Status Survey:    Vitals:   01/05/20 1636  BP: 110/60  Pulse: 80  Resp: 20  Temp: 98 F (36.7 C)  SpO2: 96%  Weight: 152 lb (68.9 kg)  Height: 5\' 6"  (1.676 m)   Body mass index is 24.53 kg/m. Physical Exam Vitals and nursing note reviewed.  Constitutional:      Appearance: Normal appearance.  HENT:     Head: Normocephalic and atraumatic.     Mouth/Throat:     Mouth: Mucous membranes are moist.  Eyes:     Extraocular Movements: Extraocular movements intact.     Conjunctiva/sclera: Conjunctivae normal.     Pupils: Pupils are equal, round, and reactive to light.  Cardiovascular:     Rate and Rhythm: Normal rate and regular rhythm.     Heart sounds: No murmur heard.   Pulmonary:     Effort: Pulmonary effort is normal.     Breath sounds: No rales.  Abdominal:     Palpations: Abdomen is soft.     Tenderness: There is no abdominal tenderness.  Musculoskeletal:     Cervical back: Normal range of motion and neck supple.     Right lower leg: No edema.     Left lower leg: No edema.  Skin:    General: Skin is warm and dry.     Comments: Right forehead hematoma, right upper eyelid ecchymosis healing.   Neurological:     General: No focal deficit present.     Mental Status: She is alert. Mental status is at baseline.     Gait: Gait abnormal.     Comments: Oriented to person, long term memory about her mom, dad, education, her occupation, home etc seems intact  Psychiatric:     Comments: Confused.      Labs reviewed: Recent Labs    09/15/19 0000 11/01/19 0000 12/27/19 0000  NA 140 140 134*  K 4.0 4.8 4.1  CL 100 103 97*  CO2 30* 29* 28*  BUN 50* 15 13  CREATININE 1.2* 1.0 0.9  CALCIUM 9.3 9.3 8.8   Recent Labs    09/15/19 0000  11/01/19 0000 12/27/19 0000  AST 16 13 12*  ALT 23 7 6*  ALKPHOS 80 86 58  ALBUMIN 3.5 3.7 3.5   Recent Labs    07/12/19 0000 07/12/19 0000 09/15/19 0000 11/01/19 0000 12/27/19 0000  WBC 6.6   < > 11.4 6.9 6.1  NEUTROABS 4,158  --   --  4,333  --   HGB 12.9   < > 13.1 13.4 11.9*  HCT 40   < > 42 40 37  PLT 231  --  267 217  --    < > = values in  this interval not displayed.   No results found for: TSH No results found for: HGBA1C No results found for: CHOL, HDL, LDLCALC, LDLDIRECT, TRIG, CHOLHDL  Significant Diagnostic Results in last 30 days:  No results found.  Assessment/Plan: Agitation Seems better, continue Seroquel, Depakote  Dementia (HCC) Progressing gradually, continue Memory care unit [facility] for safety, care assistance. Continue Memantine, Donepezil for memory preserving.   Depression with anxiety Associated with dementia progression, continue Depakote, Seroquel, may consider SSRI. No Lorazepam since 11/29/19, reported 12/05/19 the patient has been calm and pleasant without exit seeking behaviors. 01/06/20 the patient was pacing, mild anxiety seen, will try Sertraline 25mg  qd. BMP one week    Family/ staff Communication: plan of care reviewed with the patient and charge nurse.   Labs/tests ordered:  None  Time spend 25 minutes.

## 2020-01-05 NOTE — Assessment & Plan Note (Signed)
Progressing gradually, continue Memory care unit [facility] for safety, care assistance. Continue Memantine, Donepezil for memory preserving.

## 2020-01-05 NOTE — Assessment & Plan Note (Addendum)
Associated with dementia progression, continue Depakote, Seroquel, may consider SSRI. No Lorazepam since 11/29/19, reported 12/05/19 the patient has been calm and pleasant without exit seeking behaviors. 01/06/20 the patient was pacing, mild anxiety seen, will try Sertraline 25mg  qd. BMP one week

## 2020-01-06 ENCOUNTER — Encounter: Payer: Self-pay | Admitting: Nurse Practitioner

## 2020-01-18 ENCOUNTER — Encounter: Payer: Self-pay | Admitting: Nurse Practitioner

## 2020-01-18 DIAGNOSIS — E871 Hypo-osmolality and hyponatremia: Secondary | ICD-10-CM | POA: Insufficient documentation

## 2020-01-18 LAB — BASIC METABOLIC PANEL
BUN: 14 (ref 4–21)
CO2: 25 — AB (ref 13–22)
Chloride: 96 — AB (ref 99–108)
Creatinine: 0.9 (ref 0.5–1.1)
Glucose: 73
Potassium: 4.3 (ref 3.4–5.3)
Sodium: 132 — AB (ref 137–147)

## 2020-01-18 LAB — COMPREHENSIVE METABOLIC PANEL: Calcium: 8.8 (ref 8.7–10.7)

## 2020-01-23 ENCOUNTER — Non-Acute Institutional Stay (SKILLED_NURSING_FACILITY): Payer: Medicare Other | Admitting: Nurse Practitioner

## 2020-01-23 ENCOUNTER — Encounter: Payer: Self-pay | Admitting: Nurse Practitioner

## 2020-01-23 DIAGNOSIS — F418 Other specified anxiety disorders: Secondary | ICD-10-CM | POA: Diagnosis not present

## 2020-01-23 DIAGNOSIS — F02818 Dementia in other diseases classified elsewhere, unspecified severity, with other behavioral disturbance: Secondary | ICD-10-CM

## 2020-01-23 DIAGNOSIS — F0281 Dementia in other diseases classified elsewhere with behavioral disturbance: Secondary | ICD-10-CM

## 2020-01-23 DIAGNOSIS — G301 Alzheimer's disease with late onset: Secondary | ICD-10-CM

## 2020-01-23 NOTE — Assessment & Plan Note (Signed)
Reported since 01/16/20  escalated anxiety, agitation, attempted to leave, easily distracted from meals, declined telephone visit with HPOA, stay up late, packing, medication refusal, striking out when assisted Sertraline started 01/06/20 for above stated symptoms, no efficacy, will discontinue it. Will increase Seroquel to 32m qhs, continue Seroquel 12.553mqam, continue Depakote. Update CBC/diff, CMP/eGFR

## 2020-01-23 NOTE — Progress Notes (Signed)
Location:   [facility] Room Number: 103 Place of Service:  SNF (31) Provider: Xie  NP  Gupta, Anjali L, MD  Patient Care Team: Gupta, Anjali L, MD as PCP - General (Internal Medicine)  Extended Emergency Contact Information Primary Emergency Contact: Jeffreys, Margaret Mobile Phone: [phone] Relation: Relative  Code Status: DNR Goals of care: Advanced Directive information Advanced Directives 12/28/2019  Does Patient Have a Medical Advance Directive? Yes  Type of Advance Directive Healthcare Power of Attorney;Living will  Does patient want to make changes to medical advance directive? No - Patient declined  Copy of Healthcare Power of Attorney in Chart? Yes - validated most recent copy scanned in chart (See row information)  Pre-existing out of facility DNR order (yellow form or pink MOST form) -     Chief Complaint  Patient presents with  . Acute Visit    Several episodes of increased anxiety with behaviors.     HPI:  Pt is a 87 y.o. female seen today for an acute visit for reported since 01/16/20  escalated anxiety, agitation, attempted to leave, easily distracted from meals, declined telephone visit with HPOA, stay up late, packing, medication refusal, pacing,  striking out when assisted Sertraline started 01/06/20 for above stated symptoms, no efficacy. Takes Seroquel, Depakote for mood  Dementia, resides in memory care unit, exit seeking behaviors, takes Memantine, Donepezil.      Past Medical History:  Diagnosis Date  . Arthritis   . Dementia (HCC)   . Hx of concussion    [facility]  . Hyperlipidemia   . Vitamin D deficiency, unspecified    Past Surgical History:  Procedure Laterality Date  . CHOLECYSTECTOMY      Allergies  Allergen Reactions  . Fish-Derived Products   . Pravachol [Pravastatin Sodium]     Allergies as of 01/23/2020      Reactions   Fish-derived Products    Pravachol [pravastatin  Sodium]       Medication List       Accurate as of January 23, 2020 11:59 PM. If you have any questions, ask your nurse or doctor.        STOP taking these medications   LORazepam 0.5 MG tablet Commonly known as: ATIVAN Stopped by:  X , NP     TAKE these medications   CALCIUM-VITAMIN D PO Take 1 tablet by mouth in the morning and at bedtime. 600mg (1,500mg) - 400 unit   divalproex 250 MG DR tablet Commonly known as: DEPAKOTE Take 250 mg by mouth every 8 (eight) hours.   donepezil 10 MG tablet Commonly known as: ARICEPT Take 10 mg by mouth at bedtime.   memantine 10 MG tablet Commonly known as: NAMENDA Take 10 mg by mouth 2 (two) times daily.   POLYVINYL ALCOHOL-POVIDONE OP Apply to eye. drops; 0.5-0.6 %; amt: 2 drops each eye; ophthalmic (eye) Three Times A Day   QUEtiapine 25 MG tablet Commonly known as: SEROQUEL Take 12.5 mg by mouth daily.   QUEtiapine 25 MG tablet Commonly known as: SEROQUEL Take 25 mg by mouth at bedtime.   triamcinolone cream 0.1 % Commonly known as: KENALOG Apply 1 application topically. Apply to rash on ankles twice a day until healed.       Review of Systems  Constitutional: Negative for appetite change, fatigue and fever.  HENT: Positive for hearing loss. Negative for congestion and voice change.   Eyes: Negative for visual disturbance.         Dry eyes  Respiratory: Negative for cough and shortness of breath.   Cardiovascular: Negative for leg swelling.  Gastrointestinal: Negative for abdominal pain and constipation.  Genitourinary: Negative for dysuria and urgency.  Musculoskeletal: Positive for gait problem.  Skin: Negative for color change.       Right forehead hematoma, healing.   Neurological: Negative for facial asymmetry, speech difficulty and weakness.       Dementia  Psychiatric/Behavioral: Positive for agitation, behavioral problems, confusion and sleep disturbance. The patient is nervous/anxious.         Worsening restlessness, packing, exit seeking, not sleeping, pacing, meds refusal, distracted from meals, striking out once or twice.     Immunization History  Administered Date(s) Administered  . Influenza-Unspecified 02/26/2019  . Moderna SARS-COVID-2 Vaccination 05/28/2019, 06/25/2019   Pertinent  Health Maintenance Due  Topic Date Due  . DEXA SCAN  Never done  . PNA vac Low Risk Adult (1 of 2 - PCV13) Never done  . INFLUENZA VACCINE  12/25/2019   No flowsheet data found. Functional Status Survey:    Vitals:   01/23/20 1433  BP: 140/70  Pulse: 72  Resp: 20  Temp: 98.2 F (36.8 C)  SpO2: 95%  Weight: 152 lb (68.9 kg)  Height: 5' 6" (1.676 m)   Body mass index is 24.53 kg/m. Physical Exam Vitals and nursing note reviewed.  Constitutional:      Appearance: Normal appearance.  HENT:     Head: Normocephalic and atraumatic.     Mouth/Throat:     Mouth: Mucous membranes are moist.  Eyes:     Extraocular Movements: Extraocular movements intact.     Conjunctiva/sclera: Conjunctivae normal.     Pupils: Pupils are equal, round, and reactive to light.  Cardiovascular:     Rate and Rhythm: Normal rate and regular rhythm.     Heart sounds: No murmur heard.   Pulmonary:     Effort: Pulmonary effort is normal.     Breath sounds: No rales.  Abdominal:     Palpations: Abdomen is soft.     Tenderness: There is no abdominal tenderness.  Musculoskeletal:     Cervical back: Normal range of motion and neck supple.     Right lower leg: No edema.     Left lower leg: No edema.  Skin:    General: Skin is warm and dry.  Neurological:     General: No focal deficit present.     Mental Status: She is alert. Mental status is at baseline.     Gait: Gait abnormal.     Comments: Oriented to person, long term memory about her mom, dad, education, her occupation, home etc seems intact  Psychiatric:     Comments: Confused, pleasant, conversed     Labs reviewed: Recent Labs     11/01/19 0000 12/27/19 0000 01/18/20 0000  NA 140 134* 132*  K 4.8 4.1 4.3  CL 103 97* 96*  CO2 29* 28* 25*  BUN 15 13 14  CREATININE 1.0 0.9 0.9  CALCIUM 9.3 8.8 8.8   Recent Labs    09/15/19 0000 11/01/19 0000 12/27/19 0000  AST 16 13 12*  ALT 23 7 6*  ALKPHOS 80 86 58  ALBUMIN 3.5 3.7 3.5   Recent Labs    07/12/19 0000 07/12/19 0000 09/15/19 0000 11/01/19 0000 12/27/19 0000  WBC 6.6   < > 11.4 6.9 6.1  NEUTROABS 4,158  --   --  4,333  --   HGB 12.9   < >   13.1 13.4 11.9*  HCT 40   < > 42 40 37  PLT 231  --  267 217  --    < > = values in this interval not displayed.   No results found for: TSH No results found for: HGBA1C No results found for: CHOL, HDL, LDLCALC, LDLDIRECT, TRIG, CHOLHDL  Significant Diagnostic Results in last 30 days:  No results found.  Assessment/Plan: Depression with anxiety Reported since 01/16/20  escalated anxiety, agitation, attempted to leave, easily distracted from meals, declined telephone visit with HPOA, stay up late, packing, medication refusal, striking out when assisted Sertraline started 01/06/20 for above stated symptoms, no efficacy, will discontinue it. Will increase Seroquel to 25mg qhs, continue Seroquel 12.5mg qam, continue Depakote. Update CBC/diff, CMP/eGFR   Dementia (HCC) Dementia, resides in memory care unit, exit seeking behaviors, takes Memantine, Donepezil.     Family/ staff Communication: plan of care reviewed with the patient and charge nurse.   Labs/tests ordered:  CBC/diff, CMP/eGFR  Time spend 35 minutes.  

## 2020-01-23 NOTE — Assessment & Plan Note (Signed)
Dementia, resides in memory care unit, exit seeking behaviors, takes Memantine, Donepezil.

## 2020-01-24 ENCOUNTER — Encounter: Payer: Self-pay | Admitting: Nurse Practitioner

## 2020-01-24 LAB — BASIC METABOLIC PANEL
BUN: 10 (ref 4–21)
CO2: 29 — AB (ref 13–22)
Chloride: 96 — AB (ref 99–108)
Creatinine: 0.9 (ref 0.5–1.1)
Glucose: 80
Potassium: 4.3 (ref 3.4–5.3)
Sodium: 132 — AB (ref 137–147)

## 2020-01-24 LAB — CBC AND DIFFERENTIAL
HCT: 35 — AB (ref 36–46)
Hemoglobin: 11.4 — AB (ref 12.0–16.0)
Platelets: 177 (ref 150–399)
WBC: 4.9

## 2020-01-24 LAB — COMPREHENSIVE METABOLIC PANEL
Albumin: 3.2 — AB (ref 3.5–5.0)
Calcium: 8.5 — AB (ref 8.7–10.7)
GFR calc Af Amer: 68
GFR calc non Af Amer: 58
Globulin: 2.5

## 2020-01-24 LAB — HEPATIC FUNCTION PANEL
ALT: 8 (ref 7–35)
AST: 12 — AB (ref 13–35)
Alkaline Phosphatase: 51 (ref 25–125)
Bilirubin, Total: 0.4

## 2020-01-24 LAB — CBC: RBC: 3.97 (ref 3.87–5.11)

## 2020-01-31 ENCOUNTER — Non-Acute Institutional Stay (SKILLED_NURSING_FACILITY): Payer: Medicare Other | Admitting: Internal Medicine

## 2020-01-31 ENCOUNTER — Encounter: Payer: Self-pay | Admitting: Internal Medicine

## 2020-01-31 DIAGNOSIS — E785 Hyperlipidemia, unspecified: Secondary | ICD-10-CM

## 2020-01-31 DIAGNOSIS — F418 Other specified anxiety disorders: Secondary | ICD-10-CM

## 2020-01-31 DIAGNOSIS — G301 Alzheimer's disease with late onset: Secondary | ICD-10-CM | POA: Diagnosis not present

## 2020-01-31 DIAGNOSIS — F0281 Dementia in other diseases classified elsewhere with behavioral disturbance: Secondary | ICD-10-CM

## 2020-01-31 DIAGNOSIS — F02818 Dementia in other diseases classified elsewhere, unspecified severity, with other behavioral disturbance: Secondary | ICD-10-CM

## 2020-01-31 NOTE — Progress Notes (Signed)
Location:   [facility] Room Number: 103 Place of Service:  SNF [phone]) Provider:  Einar Crow, MD  [name], MD  Patient Care Team: [name], MD as PCP - General (Internal Medicine)  Extended Emergency Contact Information Primary Emergency Contact: [name] Mobile Phone: [phone] Relation: Relative  Code Status:  DNR Goals of care: Advanced Directive information Advanced Directives 01/31/2020  Does Patient Have a Medical Advance Directive? Yes  Type of Estate agent of [place];Living will  Does patient want to make changes to medical advance directive? No - Patient declined  Copy of Healthcare Power of Attorney in Chart? Yes - validated most recent copy scanned in chart (See row information)  Pre-existing out of facility DNR order (yellow form or pink MOST form) -     Chief Complaint  Patient presents with  . Acute Visit    Behavior    HPI:  Pt is a 86 y.o. female seen today for an acute visit for   Patient is a long-term resident of assisted living Patient has h/odementiano recentimaging, hyperlipidemia, history of unstable gait  Patient was living by herself in [place]. She is not married and does not have any children. Because of progressive dementia and inability for her to take care of herself she moved.with distant relative.And he decided to move her to [facility]. She is now in Memory unit She continues to have issue with Behavior. Agitation. Packing her stuff to go back to [place]. Exit seeking. 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 Past Medical History:  Diagnosis Date  . Arthritis   . Dementia (HCC)   . Hx of concussion    [facility] [place]  . Hyperlipidemia   . Vitamin D deficiency, unspecified    Past Surgical History:  Procedure Laterality  Date  . CHOLECYSTECTOMY      Allergies  Allergen Reactions  . Fish-Derived Products   . Pravachol [Pravastatin Sodium]     Allergies as of 01/31/2020      Reactions   Fish-derived Products    Pravachol [pravastatin Sodium]       Medication List       Accurate as of January 31, 2020 11:48 AM. If you have any questions, ask your nurse or doctor.        CALCIUM-VITAMIN D PO Take 1 tablet by mouth in the morning and at bedtime. 600mg  (1,500mg ) - 400 unit   divalproex 250 MG DR tablet Commonly known as: DEPAKOTE Take 250 mg by mouth every 8 (eight) hours.   donepezil 10 MG tablet Commonly known as: ARICEPT Take 10 mg by mouth at bedtime.   memantine 10 MG tablet Commonly known as: NAMENDA Take 10 mg by mouth 2 (two) times daily.   POLYVINYL ALCOHOL-POVIDONE OP Apply to eye. drops; 0.5-0.6 %; amt: 2 drops each eye; ophthalmic (eye) Three Times A Day   QUEtiapine 25 MG tablet Commonly known as: SEROQUEL Take 12.5 mg by mouth daily.   QUEtiapine 25 MG tablet Commonly known as: SEROQUEL Take 25 mg by mouth at bedtime.   triamcinolone cream 0.1 % Commonly known as: KENALOG Apply 1 application topically. Apply to rash on ankles twice a day until healed.       Review of Systems  Unable to perform ROS: Dementia    Immunization History  Administered Date(s) Administered  .  Influenza-Unspecified 02/26/2019  . Moderna SARS-COVID-2 Vaccination 05/28/2019, 06/25/2019  . Pneumococcal-Unspecified 05/22/2016   Pertinent  Health Maintenance Due  Topic Date Due  . DEXA SCAN  Never done  . PNA vac Low Risk Adult (2 of 2 - PCV13) 05/22/2017  . INFLUENZA VACCINE  12/25/2019   No flowsheet data found. Functional Status Survey:    Vitals:   01/31/20 1144  BP: 124/64  Pulse: 72  Resp: 20  Temp: (!) 97.2 F (36.2 C)  SpO2: 96%  Weight: 152 lb (68.9 kg)  Height: 5\' 6"  (1.676 m)   Body mass index is 24.53 kg/m. Physical Exam  Constitutional: . Well-developed  and well-nourished.  HENT:  Head: Normocephalic.  Mouth/Throat: Oropharynx is clear and moist.  Eyes: Pupils are equal, round, and reactive to light.  Neck: Neck supple.  Cardiovascular: Normal rate and normal heart sounds.  No murmur heard. Pulmonary/Chest: Effort normal and breath sounds normal. No respiratory distress. No wheezes. She has no rales.  Abdominal: Soft. Bowel sounds are normal. No distension. There is no tenderness. There is no rebound.  Musculoskeletal: No edema.  Lymphadenopathy: none Neurological: No Focal Deficits Skin: Skin is warm and dry.  Psychiatric: Normal mood and affect. Behavior is normal. Thought content normal.    Labs reviewed: Recent Labs    11/01/19 0000 12/27/19 0000 01/18/20 0000  NA 140 134* 132*  K 4.8 4.1 4.3  CL 103 97* 96*  CO2 29* 28* 25*  BUN 15 13 14   CREATININE 1.0 0.9 0.9  CALCIUM 9.3 8.8 8.8   Recent Labs    09/15/19 0000 11/01/19 0000 12/27/19 0000  AST 16 13 12*  ALT 23 7 6*  ALKPHOS 80 86 58  ALBUMIN 3.5 3.7 3.5   Recent Labs    07/12/19 0000 07/12/19 0000 09/15/19 0000 11/01/19 0000 12/27/19 0000  WBC 6.6   < > 11.4 6.9 6.1  NEUTROABS 4,158  --   --  4,333  --   HGB 12.9   < > 13.1 13.4 11.9*  HCT 40   < > 42 40 37  PLT 231  --  267 217  --    < > = values in this interval not displayed.   No results found for: TSH No results found for: HGBA1C No results found for: CHOL, HDL, LDLCALC, LDLDIRECT, TRIG, CHOLHDL  Significant Diagnostic Results in last 30 days:  No results found.  Assessment/Plan Late onset Alzheimer's disease with behavioral disturbance (HCC) Continue Aricept and Namenda Psych was consulted and they recommended to Change Depakote to 250 mg Sprinkles Q 8 Also will start on Klonopin BID Continue Seroquel. Depression with anxiety Did not do well on Zoloft was taken off Hyperlipidemia, unspecified hyperlipidemia type Listed allergic to statin  Vitamin D insufficiency Oncalcium and  vitamin D supplement  Family/ staff Communication:   Labs/tests ordered:

## 2020-02-15 ENCOUNTER — Non-Acute Institutional Stay (SKILLED_NURSING_FACILITY): Payer: Medicare Other | Admitting: Nurse Practitioner

## 2020-02-15 ENCOUNTER — Encounter: Payer: Self-pay | Admitting: Nurse Practitioner

## 2020-02-15 DIAGNOSIS — M25561 Pain in right knee: Secondary | ICD-10-CM

## 2020-02-15 DIAGNOSIS — T148XXA Other injury of unspecified body region, initial encounter: Secondary | ICD-10-CM | POA: Diagnosis not present

## 2020-02-15 DIAGNOSIS — W19XXXA Unspecified fall, initial encounter: Secondary | ICD-10-CM

## 2020-02-15 DIAGNOSIS — S0083XA Contusion of other part of head, initial encounter: Secondary | ICD-10-CM

## 2020-02-15 NOTE — Progress Notes (Signed)
Location:   [facility] Room Number: 103 Place of Service:  SNF [phone]) Provider:  Abbey Chatters, NP  [name], MD  Patient Care Team: [name], MD as PCP - General (Internal Medicine)  Extended Emergency Contact Information Primary Emergency Contact: [name] Mobile Phone: [phone] Relation: Relative  Code Status:  DNR Goals of care: Advanced Directive information Advanced Directives 02/15/2020  Does Patient Have a Medical Advance Directive? Yes  Type of Estate agent of [place];Living will  Does patient want to make changes to medical advance directive? No - Patient declined  Copy of Healthcare Power of Attorney in Chart? Yes - validated most recent copy scanned in chart (See row information)  Pre-existing out of facility DNR order (yellow form or pink MOST form) -     Chief Complaint  Patient presents with  . Acute Visit    Fall    HPI:  Pt is a 86 y.o. female seen today for an acute visit for fall. Resident was sitting at table in common area and when she was getting up she tripped over the foot of the table and then fell. She reports she hit her knee and then landed on her face. Pt with hx of dementia but able to recall the fall. There was no LOC. Noted to have abrasion to left forearm and bruising with swelling almost immediately to right cheek area. There was no neck pain noted. She was able to get up with the assistance of the staff and placed in her room.    Past Medical History:  Diagnosis Date  . Arthritis   . Dementia (HCC)   . Hx of concussion    [facility] [place]  . Hyperlipidemia   . Vitamin D deficiency, unspecified    Past Surgical History:  Procedure Laterality Date  . CHOLECYSTECTOMY      Allergies  Allergen Reactions  . Fish-Derived Products   . Pravachol [Pravastatin Sodium]     Allergies as of 02/15/2020      Reactions   Fish-derived Products     Pravachol [pravastatin Sodium]       Medication List       Accurate as of February 15, 2020  4:30 PM. If you have any questions, ask your nurse or doctor.        CALCIUM-VITAMIN D PO Take 1 tablet by mouth in the morning and at bedtime. 600mg  (1,500mg ) - 400 unit   clonazePAM 0.5 MG tablet Commonly known as: KLONOPIN Take 0.5 mg by mouth at bedtime.   divalproex 250 MG DR tablet Commonly known as: DEPAKOTE Take 250 mg by mouth every 8 (eight) hours.   donepezil 10 MG tablet Commonly known as: ARICEPT Take 10 mg by mouth at bedtime.   memantine 10 MG tablet Commonly known as: NAMENDA Take 10 mg by mouth 2 (two) times daily.   POLYVINYL ALCOHOL-POVIDONE OP Apply to eye. drops; 0.5-0.6 %; amt: 2 drops each eye; ophthalmic (eye) Three Times A Day   QUEtiapine 25 MG tablet Commonly known as: SEROQUEL Take 12.5 mg by mouth daily.   QUEtiapine 25 MG tablet Commonly known as: SEROQUEL Take 25 mg by mouth at bedtime.   triamcinolone cream 0.1 % Commonly known as: KENALOG Apply 1 application topically. Apply to rash on ankles twice a day until healed.       Review of Systems  Constitutional: Negative for activity change, appetite change, fever and unexpected weight change.  Musculoskeletal:  Positive for arthralgias, back pain, joint swelling and myalgias. Negative for gait problem, neck pain and neck stiffness.  Neurological: Negative for dizziness, speech difficulty, light-headedness, numbness and headaches.  Psychiatric/Behavioral: Positive for confusion.    Immunization History  Administered Date(s) Administered  . Influenza-Unspecified 02/26/2019  . Moderna SARS-COVID-2 Vaccination 05/28/2019, 06/25/2019  . Pneumococcal-Unspecified 05/22/2016   Pertinent  Health Maintenance Due  Topic Date Due  . DEXA SCAN  Never done  . PNA vac Low Risk Adult (2 of 2 - PCV13) 05/22/2017  . INFLUENZA VACCINE  12/25/2019   No flowsheet data found. Functional Status  Survey:    Vitals:   02/15/20 1624  BP: 118/60  Pulse: 72  Resp: 20  Temp: (!) 97.5 F (36.4 C)  SpO2: 94%  Weight: 150 lb 9.6 oz (68.3 kg)  Height: 5\' 6"  (1.676 m)   Body mass index is 24.31 kg/m. Physical Exam Constitutional:      Appearance: Normal appearance.  HENT:     Head: Contusion (to right cheek) present. No right periorbital erythema, left periorbital erythema or laceration.     Jaw: No tenderness.  Cardiovascular:     Rate and Rhythm: Normal rate and regular rhythm.     Heart sounds: Normal heart sounds.  Pulmonary:     Effort: Pulmonary effort is normal.     Breath sounds: Normal breath sounds and air entry.  Musculoskeletal:     Right shoulder: Normal.     Left shoulder: Normal.     Cervical back: Full passive range of motion without pain and normal range of motion.     Lumbar back: No tenderness. Negative right straight leg raise test and negative left straight leg raise test.       Back:     Right knee: No effusion or erythema. Tenderness present over the lateral joint line. No LCL laxity, MCL laxity, ACL laxity or PCL laxity. Normal alignment and normal meniscus. Normal pulse.     Left knee: Normal.     Right lower leg: No edema.     Left lower leg: No edema.     Right ankle: Normal.     Left ankle: Normal.     Comments: Paraspinal tenderness to left lumbar spine  Abrasion noted to right forearm   Neurological:     Mental Status: She is alert.     Labs reviewed: Recent Labs    11/01/19 0000 12/27/19 0000 01/18/20 0000  NA 140 134* 132*  K 4.8 4.1 4.3  CL 103 97* 96*  CO2 29* 28* 25*  BUN 15 13 14   CREATININE 1.0 0.9 0.9  CALCIUM 9.3 8.8 8.8   Recent Labs    09/15/19 0000 11/01/19 0000 12/27/19 0000  AST 16 13 12*  ALT 23 7 6*  ALKPHOS 80 86 58  ALBUMIN 3.5 3.7 3.5   Recent Labs    07/12/19 0000 07/12/19 0000 09/15/19 0000 11/01/19 0000 12/27/19 0000  WBC 6.6   < > 11.4 6.9 6.1  NEUTROABS 4,158  --   --  4,333  --   HGB  12.9   < > 13.1 13.4 11.9*  HCT 40   < > 42 40 37  PLT 231  --  267 217  --    < > = values in this interval not displayed.   No results found for: TSH No results found for: HGBA1C No results found for: CHOL, HDL, LDLCALC, LDLDIRECT, TRIG, CHOLHDL  Significant Diagnostic Results in last 30  days:  No results found.  Assessment/Plan 1. Fall, initial encounter Tripped over base of table. She has contusion of face and knee with tenderness but overall doing well. tylenol scheduled 1000 mg every 8 hours for 48 hours, then PRN, ice encouraged. Fall precautions discussed. Nursing will continue neuro checks and notify of any change in condition.   2. Contusion of face, initial encounter -neuro exam at baseline, nursing will continue to monitor. to use ice TID, tylenol scheduled 1000 mg every 8 hours for 48 hours, then PRN   3. Abrasion To forearm, nursing to clean and cover as needed  4. Acute pain of right knee After fall, tenderness on exam, without significant swelling or bruising at this time. Normal ROM. Encouraged ice TID and tylenol scheduled 1000 mg every 8 hours for 48 hours, then PRN  Sadiel Mota K. Biagio Borg  [facility] [phone]

## 2020-02-17 ENCOUNTER — Non-Acute Institutional Stay (SKILLED_NURSING_FACILITY): Payer: Medicare Other | Admitting: Internal Medicine

## 2020-02-17 ENCOUNTER — Encounter: Payer: Self-pay | Admitting: Internal Medicine

## 2020-02-17 DIAGNOSIS — R451 Restlessness and agitation: Secondary | ICD-10-CM

## 2020-02-17 DIAGNOSIS — E785 Hyperlipidemia, unspecified: Secondary | ICD-10-CM

## 2020-02-17 DIAGNOSIS — F418 Other specified anxiety disorders: Secondary | ICD-10-CM | POA: Diagnosis not present

## 2020-02-17 DIAGNOSIS — G301 Alzheimer's disease with late onset: Secondary | ICD-10-CM | POA: Diagnosis not present

## 2020-02-17 DIAGNOSIS — F02818 Dementia in other diseases classified elsewhere, unspecified severity, with other behavioral disturbance: Secondary | ICD-10-CM

## 2020-02-17 DIAGNOSIS — F0281 Dementia in other diseases classified elsewhere with behavioral disturbance: Secondary | ICD-10-CM

## 2020-02-17 DIAGNOSIS — E559 Vitamin D deficiency, unspecified: Secondary | ICD-10-CM

## 2020-02-17 NOTE — Progress Notes (Signed)
Location:   [facility] Room Number: 103-A Place of Service:  SNF [phone]) Provider: [name], MD     Patient Care Team: [name], MD as PCP - General (Internal Medicine)  Extended Emergency Contact Information Primary Emergency Contact: [name] Mobile Phone: [phone] Relation: Relative  Code Status:  DNR Goals of care: Advanced Directive information Advanced Directives 02/17/2020  Does Patient Have a Medical Advance Directive? Yes  Type of Estate agent of [place];Living will;Out of facility DNR (pink MOST or yellow form)  Does patient want to make changes to medical advance directive? No - Patient declined  Copy of Healthcare Power of Attorney in Chart? Yes - validated most recent copy scanned in chart (See row information)  Pre-existing out of facility DNR order (yellow form or pink MOST form) -     Chief Complaint  Patient presents with  . Medical Management of Chronic Issues    Routine Visit.   Marland Kitchen Health Maintenance    Discuss the need for Dexa Scan.  . Immunizations    Discuss the need for Tetanus Vaccine, PNA Vaccine, and Influenza Vaccine.     HPI:  Pt is a 86 y.o. female seen today for medical management of chronic diseases.    Patient is a long-term resident of assisted living Patient has h/odementiano recentimaging, hyperlipidemia, history of unstable gait  Patient was living by herself in [place]. She is not married and does not have any children. Because of progressive dementia and inability for her to take care of herself she moved.with distant relative.And he decided to move her to [facility]. She is now in Memory unit  Continues to have behavior issues including packing stuff and trying to leave the facility She had mechanical Fall recently Sustained some bruises and Skin tear but is doing fine now No Other Nursing issues weight stable Had no acute  complains today   Past Medical History:  Diagnosis Date  . Arthritis   . Dementia (HCC)   . Hx of concussion    [facility] [place]  . Hyperlipidemia   . Vitamin D deficiency, unspecified    Past Surgical History:  Procedure Laterality Date  . CHOLECYSTECTOMY      Allergies  Allergen Reactions  . Fish-Derived Products   . Pravachol [Pravastatin Sodium]     Allergies as of 02/17/2020      Reactions   Fish-derived Products    Pravachol [pravastatin Sodium]       Medication List       Accurate as of February 17, 2020  2:37 PM. If you have any questions, ask your nurse or doctor.        acetaminophen 500 MG tablet Commonly known as: TYLENOL Take 1,000 mg by mouth every 8 (eight) hours as needed.   CALCIUM-VITAMIN D PO Take 1 tablet by mouth in the morning and at bedtime. 600mg  (1,500mg ) - 400 unit   clonazePAM 0.5 MG tablet Commonly known as: KLONOPIN Take 0.5 mg by mouth at bedtime.   divalproex 250 MG DR tablet Commonly known as: DEPAKOTE Take 250 mg by mouth every 8 (eight) hours.   donepezil 10 MG tablet Commonly known as: ARICEPT Take 10 mg by mouth at bedtime.   memantine 10 MG tablet Commonly known as: NAMENDA Take 10 mg by mouth 2 (two) times daily.   POLYVINYL ALCOHOL-POVIDONE OP Apply to eye 3 (three) times daily. drops; 0.5-0.6 %; amt: 2 drops each eye;  ophthalmic (eye)   QUEtiapine 25 MG tablet Commonly known as: SEROQUEL Take 12.5 mg by mouth daily.   QUEtiapine 25 MG tablet Commonly known as: SEROQUEL Take 25 mg by mouth at bedtime.   triamcinolone cream 0.1 % Commonly known as: KENALOG Apply 1 application topically. Apply to rash on ankles twice a day until healed.       Review of Systems  Unable to perform ROS: Dementia    Immunization History  Administered Date(s) Administered  . Influenza-Unspecified 02/26/2019  . Moderna SARS-COVID-2 Vaccination 05/28/2019, 06/25/2019  . Pneumococcal-Unspecified  05/22/2016   Pertinent  Health Maintenance Due  Topic Date Due  . DEXA SCAN  Never done  . PNA vac Low Risk Adult (2 of 2 - PCV13) 05/22/2017  . INFLUENZA VACCINE  12/25/2019   No flowsheet data found. Functional Status Survey:    Vitals:   02/17/20 1428  BP: 130/82  Pulse: 77  Resp: 18  Temp: 98.7 F (37.1 C)  SpO2: 97%  Weight: 150 lb 9.6 oz (68.3 kg)  Height: 5\' 6"  (1.676 m)   Body mass index is 24.31 kg/m. Physical Exam  Constitutional: . Well-developed and well-nourished.  HENT:  Head: Normocephalic.  Mouth/Throat: Oropharynx is clear and moist.  Eyes: Pupils are equal, round, and reactive to light.  Neck: Neck supple.  Cardiovascular: Normal rate and normal heart sounds.  No murmur heard. Pulmonary/Chest: Effort normal and breath sounds normal. No respiratory distress. No wheezes. She has no rales.  Abdominal: Soft. Bowel sounds are normal. No distension. There is no tenderness. There is no rebound.  Musculoskeletal: No edema.  Lymphadenopathy: none Neurological: Walks with No assists.  Skin: Skin is warm and dry.  Psychiatric: Normal mood and affect. Behavior is normal. Thought content normal.    Labs reviewed: Recent Labs    12/27/19 0000 01/18/20 0000 01/24/20 0000  NA 134* 132* 132*  K 4.1 4.3 4.3  CL 97* 96* 96*  CO2 28* 25* 29*  BUN 13 14 10   CREATININE 0.9 0.9 0.9  CALCIUM 8.8 8.8 8.5*   Recent Labs    11/01/19 0000 12/27/19 0000 01/24/20 0000  AST 13 12* 12*  ALT 7 6* 8  ALKPHOS 86 58 51  ALBUMIN 3.7 3.5 3.2*   Recent Labs    07/12/19 0000 07/12/19 0000 09/15/19 0000 09/15/19 0000 11/01/19 0000 12/27/19 0000 01/24/20 0000  WBC 6.6   < > 11.4   < > 6.9 6.1 4.9  NEUTROABS 4,158  --   --   --  4,333  --   --   HGB 12.9   < > 13.1   < > 13.4 11.9* 11.4*  HCT 40   < > 42   < > 40 37 35*  PLT 231   < > 267  --  217  --  177   < > = values in this interval not displayed.   No results found for: TSH No results found for:  HGBA1C No results found for: CHOL, HDL, LDLCALC, LDLDIRECT, TRIG, CHOLHDL  Significant Diagnostic Results in last 30 days:  No results found.  Assessment/Plan Late onset Alzheimer's disease with behavioral disturbance (HCC) Aricept and Namenda Also on Depakote  Cannot GDR Seroquel right now Also on Klonipin Depression with anxiety Continue Klonopin Did not do well on Zoloft Hyperlipidemia, unspecified hyperlipidemia type Is listed allergic to statin Vitamin D insufficiency On Supplement    Family/ staff Communication:   Labs/tests ordered:

## 2020-02-21 ENCOUNTER — Encounter: Payer: Self-pay | Admitting: Internal Medicine

## 2020-02-21 ENCOUNTER — Non-Acute Institutional Stay (SKILLED_NURSING_FACILITY): Payer: Medicare Other | Admitting: Internal Medicine

## 2020-02-21 DIAGNOSIS — E785 Hyperlipidemia, unspecified: Secondary | ICD-10-CM

## 2020-02-21 DIAGNOSIS — F02818 Dementia in other diseases classified elsewhere, unspecified severity, with other behavioral disturbance: Secondary | ICD-10-CM

## 2020-02-21 DIAGNOSIS — M25511 Pain in right shoulder: Secondary | ICD-10-CM | POA: Diagnosis not present

## 2020-02-21 DIAGNOSIS — G301 Alzheimer's disease with late onset: Secondary | ICD-10-CM

## 2020-02-21 DIAGNOSIS — E559 Vitamin D deficiency, unspecified: Secondary | ICD-10-CM

## 2020-02-21 DIAGNOSIS — W19XXXS Unspecified fall, sequela: Secondary | ICD-10-CM

## 2020-02-21 DIAGNOSIS — F418 Other specified anxiety disorders: Secondary | ICD-10-CM

## 2020-02-21 DIAGNOSIS — F0281 Dementia in other diseases classified elsewhere with behavioral disturbance: Secondary | ICD-10-CM

## 2020-02-21 NOTE — Progress Notes (Signed)
Location:   [facility] Room Number: 103 Place of Service:  SNF [phone]) Provider:  Einar Crow MD  [name], MD  Patient Care Team: [name], MD as PCP - General (Internal Medicine)  Extended Emergency Contact Information Primary Emergency Contact: [name] Mobile Phone: [phone] Relation: Relative  Code Status:  DNR Goals of care: Advanced Directive information Advanced Directives 02/17/2020  Does Patient Have a Medical Advance Directive? Yes  Type of Estate agent of [place];Living will;Out of facility DNR (pink MOST or yellow form)  Does patient want to make changes to medical advance directive? No - Patient declined  Copy of Healthcare Power of Attorney in Chart? Yes - validated most recent copy scanned in chart (See row information)  Pre-existing out of facility DNR order (yellow form or pink MOST form) -     Chief Complaint  Patient presents with  . Acute Visit    Pain in Right Shoulder    HPI:  Pt is a 86 y.o. female seen today for an acute visit for Pain in her Right Shoulder since she fell few days ago  Patient is a long-term resident of assisted living Patient has h/odementiano recentimaging, hyperlipidemia, history of unstable gait Patient was living by herself in [place]. She is not married and does not have any children. Because of progressive dementia and inability for her to take care of herself she moved.with distant relative.And he decided to move her to [facility]. She is now in Memory unit  She fell few days ago Since then has been c/o Pain in her right shoulder not relieved by tylenol. Her pain is more on the scapula in the back.     Past Medical History:  Diagnosis Date  . Arthritis   . Dementia (HCC)   . Hx of concussion    [facility]  . Hyperlipidemia   . Vitamin D deficiency, unspecified    Past Surgical  History:  Procedure Laterality Date  . CHOLECYSTECTOMY      Allergies  Allergen Reactions  . Fish-Derived Products   . Pravachol [Pravastatin Sodium]     Allergies as of 02/21/2020      Reactions   Fish-derived Products    Pravachol [pravastatin Sodium]       Medication List       Accurate as of February 21, 2020  3:17 PM. If you have any questions, ask your nurse or doctor.        acetaminophen 500 MG tablet Commonly known as: TYLENOL Take 1,000 mg by mouth every 8 (eight) hours as needed.   Biofreeze 4 % Gel Generic drug: Menthol (Topical Analgesic) Apply topically 2 (two) times daily. On back (under left shoulder blade) twice a day for 1 week   CALCIUM-VITAMIN D PO Take 1 tablet by mouth in the morning and at bedtime. 600mg  (1,500mg ) - 400 unit   clonazePAM 0.5 MG tablet Commonly known as: KLONOPIN Take 0.5 mg by mouth at bedtime.   divalproex 250 MG DR tablet Commonly known as: DEPAKOTE Take 250 mg by mouth every 8 (eight) hours.   donepezil 10 MG tablet Commonly known as: ARICEPT Take 10 mg by mouth at bedtime.   meloxicam 7.5 MG tablet Commonly known as: MOBIC Take 7.5 mg by mouth daily. Start taking on: February 22, 2020   memantine 10 MG tablet Commonly known as: NAMENDA Take 10 mg by mouth 2 (two) times daily.   POLYVINYL  ALCOHOL-POVIDONE OP Apply to eye 3 (three) times daily. drops; 0.5-0.6 %; amt: 2 drops each eye; ophthalmic (eye)   QUEtiapine 25 MG tablet Commonly known as: SEROQUEL Take 12.5 mg by mouth daily.   QUEtiapine 25 MG tablet Commonly known as: SEROQUEL Take 25 mg by mouth at bedtime.   triamcinolone cream 0.1 % Commonly known as: KENALOG Apply 1 application topically. Apply to rash on ankles twice a day until healed.       Review of Systems  Constitutional: Negative.   HENT: Negative.   Respiratory: Negative.  Negative for cough.   Cardiovascular: Negative for chest pain.  Gastrointestinal: Negative.     Genitourinary: Negative.   Musculoskeletal: Positive for arthralgias and myalgias. Negative for gait problem and joint swelling.  Neurological: Negative for dizziness.  Psychiatric/Behavioral: Positive for behavioral problems and confusion.    Immunization History  Administered Date(s) Administered  . Influenza-Unspecified 02/26/2019  . Moderna SARS-COVID-2 Vaccination 05/28/2019, 06/25/2019  . Pneumococcal-Unspecified 05/22/2016   Pertinent  Health Maintenance Due  Topic Date Due  . DEXA SCAN  Never done  . PNA vac Low Risk Adult (2 of 2 - PCV13) 05/22/2017  . INFLUENZA VACCINE  12/25/2019   No flowsheet data found. Functional Status Survey:    Vitals:   02/21/20 1510  BP: 130/70  Pulse: 76  Resp: 18  Temp: 97.7 F (36.5 C)  SpO2: 96%  Weight: 150 lb 9.6 oz (68.3 kg)  Height: 5\' 6"  (1.676 m)   Body mass index is 24.31 kg/m. Physical Exam Constitutional:  Well-developed and well-nourished.  HENT:  Head: Normocephalic.  Mouth/Throat: Oropharynx is clear and moist.  Eyes: Pupils are equal, round, and reactive to light.  Neck: Neck supple.  Cardiovascular: Normal rate and normal heart sounds.  No murmur heard. Pulmonary/Chest: Effort normal and breath sounds normal. No respiratory distress. No wheezes. She has no rales.  Abdominal: Soft. Bowel sounds are normal. No distension. There is no tenderness. There is no rebound.  Musculoskeletal: No edema. Has tenderness in her Right shoulder No Signs of any bruising or swelling Can raise her Arms over her shoulders  Lymphadenopathy: none Neurological: Alert and oriented to person, place, and time.  Skin: Skin is warm and dry.  Psychiatric: Normal mood and affect. Behavior is normal. Thought content normal.  Labs reviewed: Recent Labs    12/27/19 0000 01/18/20 0000 01/24/20 0000  NA 134* 132* 132*  K 4.1 4.3 4.3  CL 97* 96* 96*  CO2 28* 25* 29*  BUN 13 14 10   CREATININE 0.9 0.9 0.9  CALCIUM 8.8 8.8 8.5*    Recent Labs    11/01/19 0000 12/27/19 0000 01/24/20 0000  AST 13 12* 12*  ALT 7 6* 8  ALKPHOS 86 58 51  ALBUMIN 3.7 3.5 3.2*   Recent Labs    07/12/19 0000 07/12/19 0000 09/15/19 0000 09/15/19 0000 11/01/19 0000 12/27/19 0000 01/24/20 0000  WBC 6.6   < > 11.4   < > 6.9 6.1 4.9  NEUTROABS 4,158  --   --   --  4,333  --   --   HGB 12.9   < > 13.1   < > 13.4 11.9* 11.4*  HCT 40   < > 42   < > 40 37 35*  PLT 231   < > 267  --  217  --  177   < > = values in this interval not displayed.   No results found for: TSH No results found  for: HGBA1C No results found for: CHOL, HDL, LDLCALC, LDLDIRECT, TRIG, CHOLHDL  Significant Diagnostic Results in last 30 days:  No results found.  Assessment/Plan Acute pain of right shoulder Meloxicam 7.5 mg QD for 1 week Biofreeze Gel BID  Fall, sequela Doing well without any assist  Late onset Alzheimer's disease with behavioral disturbance (HCC) On Namenda, Aricept,Depakote, Seroquel Depression with anxiety On Klonopin Failed Zoloft Will try again but does have Hyponatremia Hyperlipidemia,  Is listed allergic to statin Vitamin D insufficiency On Supplement .   Family/ staff Communication:   Labs/tests ordered:

## 2020-02-27 ENCOUNTER — Telehealth: Payer: Self-pay

## 2020-02-27 NOTE — Telephone Encounter (Signed)
Refill request received from [facility] pharmacy for Klonopin 0.5 mg take one tablet every evening at 7 pm. Medication pended and sent to Dr. Chales Abrahams for approval.

## 2020-03-21 ENCOUNTER — Non-Acute Institutional Stay (SKILLED_NURSING_FACILITY): Payer: Medicare Other | Admitting: Nurse Practitioner

## 2020-03-21 ENCOUNTER — Encounter: Payer: Self-pay | Admitting: Nurse Practitioner

## 2020-03-21 DIAGNOSIS — R635 Abnormal weight gain: Secondary | ICD-10-CM

## 2020-03-21 DIAGNOSIS — F418 Other specified anxiety disorders: Secondary | ICD-10-CM

## 2020-03-21 NOTE — Progress Notes (Addendum)
Location:   [place] Room Number: [place]:  SNF (31) Provider:  Marlana Latus, NP  [name], MD  Patient Care Team: [name], MD as PCP - General (Internal Medicine)  Extended Emergency Contact Information Primary Emergency Contact: Arnoldo Morale Mobile Phone: [phone] Relation: Relative  Code Status: DNR Goals of care: Advanced Directive information Advanced Directives 03/21/2020  Does Patient Have a Medical Advance Directive? Yes  Type of Paramedic of [place];Living will  Does patient want to make changes to medical advance directive? No - Patient declined  Copy of [place] in Chart? Yes - validated most recent copy scanned in chart (See row information)  Pre-existing out of facility DNR order (yellow form or pink MOST form) -     Chief Complaint  Patient presents with   Medical Management of Chronic Issues    Routine follow up visit.   Best Practice Recommendations    Tetanus/Tdap, Pneumonia vaccine   Quality Metric Gaps    DEXA scan    HPI:  Pt is a 86 y.o. female seen today for medical management of chronic diseases.    Dementia, resides in memory care unit [facility], takes Memantine, Donepezil  Anxiety/depression, takes Seroquel, Depakote, Clonazepam    Past Medical History:  Diagnosis Date   Arthritis    Dementia ([place])    Hx of concussion    [place]   Hyperlipidemia    Vitamin D deficiency, unspecified    Past Surgical History:  Procedure Laterality Date   CHOLECYSTECTOMY      Allergies  Allergen Reactions   Fish-Derived Products    Pravachol [Pravastatin Sodium]     Allergies as of 03/21/2020      Reactions   Fish-derived Products    Pravachol [pravastatin Sodium]       Medication List       Accurate as of March 21, 2020 11:59 PM. If you have any questions, ask your nurse or doctor.          acetaminophen 500 MG tablet Commonly known as: TYLENOL Take 1,000 mg by mouth every 8 (eight) hours as needed.   CALCIUM-VITAMIN D PO Take 1 tablet by mouth in the morning and at bedtime. 668m (1,5085m - 400 unit   clonazePAM 0.5 MG tablet Commonly known as: KLONOPIN Take 0.5 mg by mouth at bedtime.   divalproex 250 MG DR tablet Commonly known as: DEPAKOTE Take 250 mg by mouth every 8 (eight) hours.   donepezil 10 MG tablet Commonly known as: ARICEPT Take 10 mg by mouth at bedtime.   memantine 10 MG tablet Commonly known as: NAMENDA Take 10 mg by mouth 2 (two) times daily.   POLYVINYL ALCOHOL-POVIDONE OP Apply to eye 3 (three) times daily. drops; 0.5-0.6 %; amt: 2 drops each eye; ophthalmic (eye)   QUEtiapine 25 MG tablet Commonly known as: SEROQUEL Take 12.5 mg by mouth daily.   QUEtiapine 25 MG tablet Commonly known as: SEROQUEL Take 25 mg by mouth at bedtime.   triamcinolone cream 0.1 % Commonly known as: KENALOG Apply 1 application topically. Apply to rash on ankles twice a day until healed.       Review of Systems  Constitutional: Positive for unexpected weight change. Negative for fatigue and fever.       Weight gained about #5-6 Ibs in the past month  HENT: Positive for hearing loss. Negative for congestion and voice change.  Eyes: Negative for visual disturbance.       Dry eyes  Respiratory: Negative for cough and shortness of breath.   Cardiovascular: Positive for leg swelling.  Gastrointestinal: Negative for abdominal pain and constipation.  Genitourinary: Negative for dysuria and urgency.  Musculoskeletal: Positive for arthralgias and gait problem.       Right shoulder pain is improved.   Skin: Negative for color change.  Neurological: Negative for facial asymmetry, speech difficulty and weakness.       Dementia  Psychiatric/Behavioral: Positive for agitation, behavioral problems, confusion and sleep disturbance. The patient is nervous/anxious.      Immunization History  Administered Date(s) Administered   Influenza-Unspecified 02/26/2019, 03/07/2020   Moderna SARS-COVID-2 Vaccination 05/28/2019, 06/25/2019   Pneumococcal-Unspecified 05/22/2016   Pertinent  Health Maintenance Due  Topic Date Due   DEXA SCAN  Never done   PNA vac Low Risk Adult (2 of 2 - PCV13) 05/22/2017   INFLUENZA VACCINE  Completed   No flowsheet data found. Functional Status Survey:    Vitals:   03/21/20 0940  BP: 124/84  Pulse: 83  Resp: 18  Temp: 98 F (36.7 C)  SpO2: 99%  Weight: 156 lb (70.8 kg)  Height: '5\' 6"'  (1.676 m)   Body mass index is 25.18 kg/m. Physical Exam Vitals and nursing note reviewed.  Constitutional:      Appearance: Normal appearance.  HENT:     Head: Normocephalic and atraumatic.     Mouth/Throat:     Mouth: Mucous membranes are moist.  Eyes:     Extraocular Movements: Extraocular movements intact.     Conjunctiva/sclera: Conjunctivae normal.     Pupils: Pupils are equal, round, and reactive to light.  Cardiovascular:     Rate and Rhythm: Normal rate and regular rhythm.     Heart sounds: No murmur heard.   Pulmonary:     Effort: Pulmonary effort is normal.     Breath sounds: No rales.  Abdominal:     Palpations: Abdomen is soft.     Tenderness: There is no abdominal tenderness.  Musculoskeletal:     Cervical back: Normal range of motion and neck supple.     Right lower leg: Edema present.     Left lower leg: Edema present.     Comments: Trace edema BLE  Skin:    General: Skin is warm and dry.  Neurological:     General: No focal deficit present.     Mental Status: She is alert. Mental status is at baseline.     Gait: Gait abnormal.     Comments: Oriented to person, long term memory about her mom, dad, education, her occupation, home etc seems intact  Psychiatric:     Comments: Confused, pleasant, conversed     Labs reviewed: Recent Labs    12/27/19 0000 01/18/20 0000 01/24/20 0000  NA  134* 132* 132*  K 4.1 4.3 4.3  CL 97* 96* 96*  CO2 28* 25* 29*  BUN '13 14 10  ' CREATININE 0.9 0.9 0.9  CALCIUM 8.8 8.8 8.5*   Recent Labs    11/01/19 0000 12/27/19 0000 01/24/20 0000  AST 13 12* 12*  ALT 7 6* 8  ALKPHOS 86 58 51  ALBUMIN 3.7 3.5 3.2*   Recent Labs    07/12/19 0000 07/12/19 0000 09/15/19 0000 09/15/19 0000 11/01/19 0000 12/27/19 0000 01/24/20 0000  WBC 6.6   < > 11.4   < > 6.9 6.1 4.9  NEUTROABS 4,158  --   --   --  4,333  --   --   HGB 12.9   < > 13.1   < > 13.4 11.9* 11.4*  HCT 40   < > 42   < > 40 37 35*  PLT 231   < > 267  --  217  --  177   < > = values in this interval not displayed.   No results found for: TSH No results found for: HGBA1C No results found for: CHOL, HDL, LDLCALC, LDLDIRECT, TRIG, CHOLHDL  Significant Diagnostic Results in last 30 days:  No results found.  Assessment/Plan Weight gain Weight gained about #5Ibs in the past month, no apparent fluid retention, will update CBC/diff, CMP/eGFR, TSH to evaluate further.   Depression with anxiety Underwent psych evaluation, still has episodes of emotional outburst, continue Seroquel, Depakote, Clonazepam     Dur for DEXA  Family/ staff Communication: plan of care reviewed with the patient and charge nurse.   Labs/tests ordered:  CBC/diff, CMP/eGFR, TSH  Time spend 35 minutes.

## 2020-03-22 ENCOUNTER — Encounter: Payer: Self-pay | Admitting: Nurse Practitioner

## 2020-03-22 DIAGNOSIS — R635 Abnormal weight gain: Secondary | ICD-10-CM | POA: Insufficient documentation

## 2020-03-22 DIAGNOSIS — R634 Abnormal weight loss: Secondary | ICD-10-CM | POA: Insufficient documentation

## 2020-03-22 NOTE — Assessment & Plan Note (Signed)
Weight gained about #5Ibs in the past month, no apparent fluid retention, will update CBC/diff, CMP/eGFR, TSH to evaluate further.

## 2020-03-22 NOTE — Assessment & Plan Note (Signed)
Underwent psych evaluation, still has episodes of emotional outburst, continue Seroquel, Depakote, Clonazepam

## 2020-03-28 ENCOUNTER — Encounter: Payer: Self-pay | Admitting: Nurse Practitioner

## 2020-03-28 DIAGNOSIS — R7989 Other specified abnormal findings of blood chemistry: Secondary | ICD-10-CM | POA: Insufficient documentation

## 2020-03-28 LAB — CBC AND DIFFERENTIAL
HCT: 40 (ref 36–46)
Hemoglobin: 13.2 (ref 12.0–16.0)
Neutrophils Absolute: 3202
Platelets: 199 (ref 150–399)
WBC: 5.4

## 2020-03-28 LAB — COMPREHENSIVE METABOLIC PANEL
Albumin: 3.7 (ref 3.5–5.0)
Calcium: 9.4 (ref 8.7–10.7)
Globulin: 2.9

## 2020-03-28 LAB — BASIC METABOLIC PANEL
BUN: 17 (ref 4–21)
CO2: 25 — AB (ref 13–22)
Chloride: 96 — AB (ref 99–108)
Creatinine: 1 (ref 0.5–1.1)
Glucose: 86
Potassium: 4.6 (ref 3.4–5.3)
Sodium: 133 — AB (ref 137–147)

## 2020-03-28 LAB — HEPATIC FUNCTION PANEL
ALT: 6 — AB (ref 7–35)
AST: 12 — AB (ref 13–35)
Alkaline Phosphatase: 66 (ref 25–125)
Bilirubin, Total: 0.5

## 2020-03-28 LAB — CBC: RBC: 4.46 (ref 3.87–5.11)

## 2020-03-28 LAB — TSH: TSH: 5.78 (ref 0.41–5.90)

## 2020-04-25 ENCOUNTER — Non-Acute Institutional Stay (SKILLED_NURSING_FACILITY): Payer: Medicare Other | Admitting: Nurse Practitioner

## 2020-04-25 ENCOUNTER — Encounter: Payer: Self-pay | Admitting: Nurse Practitioner

## 2020-04-25 DIAGNOSIS — F418 Other specified anxiety disorders: Secondary | ICD-10-CM | POA: Diagnosis not present

## 2020-04-25 DIAGNOSIS — E871 Hypo-osmolality and hyponatremia: Secondary | ICD-10-CM

## 2020-04-25 DIAGNOSIS — G301 Alzheimer's disease with late onset: Secondary | ICD-10-CM

## 2020-04-25 DIAGNOSIS — R635 Abnormal weight gain: Secondary | ICD-10-CM

## 2020-04-25 DIAGNOSIS — R7989 Other specified abnormal findings of blood chemistry: Secondary | ICD-10-CM

## 2020-04-25 DIAGNOSIS — F0281 Dementia in other diseases classified elsewhere with behavioral disturbance: Secondary | ICD-10-CM

## 2020-04-25 DIAGNOSIS — F02818 Dementia in other diseases classified elsewhere, unspecified severity, with other behavioral disturbance: Secondary | ICD-10-CM

## 2020-04-25 NOTE — Assessment & Plan Note (Signed)
Her mood is stable, continue Depakote, Seroquel, Clonazepam

## 2020-04-25 NOTE — Assessment & Plan Note (Signed)
About #6Ibs in the past month, no s/s of fluid retention, will continue diet, exercise, observe. Close monitoring slightly elevated TSH

## 2020-04-25 NOTE — Assessment & Plan Note (Signed)
03/27/20 wbc 5.4, Hgb 13.2, plt 199, neutrophils 59.3, Na 133, K 4.6, Bun 17, creat 1.03, eGFR 50, TSH 5.78. repeat TSH 6 weeks.

## 2020-04-25 NOTE — Assessment & Plan Note (Signed)
Dementia, resides in memory care unit [facility], takes Memantine, Donepezil

## 2020-04-25 NOTE — Progress Notes (Addendum)
Location:    [place] Room Number: [place]:  SNF (31) Provider: Marda Stalker, Lennie Odor NP   [name], MD  Patient Care Team: [name], MD as PCP - General (Internal Medicine)  Extended Emergency Contact Information Primary Emergency Contact: Arnoldo Morale Mobile Phone: [phone] Relation: Relative  Code Status:  DNR Goals of care: Advanced Directive information Advanced Directives 03/21/2020  Does Patient Have a Medical Advance Directive? Yes  Type of Paramedic of [place];Living will  Does patient want to make changes to medical advance directive? No - Patient declined  Copy of [place] in Chart? Yes - validated most recent copy scanned in chart (See row information)  Pre-existing out of facility DNR order (yellow form or pink MOST form) -     Chief Complaint  Patient presents with  . Medical Management of Chronic Issues  . Health Maintenance    Dexa, TDAP, PCV13    HPI:  Pt is a 86 y.o. female seen today for medical management of chronic diseases.      Dementia, resides in memory care unit [facility], takes Memantine, Donepezil             Anxiety/depression, takes Seroquel, Depakote, Clonazepam   Weight gain, about #6Ibs in the past month, no apparent swelling, 03/28/20 TSH 5.78, CBC/CMP unremarkable  Hyponatremia, Na 133 03/28/20  Past Medical History:  Diagnosis Date  . Arthritis   . Dementia ([place])   . Hx of concussion    [place]  . Hyperlipidemia   . Vitamin D deficiency, unspecified    Past Surgical History:  Procedure Laterality Date  . CHOLECYSTECTOMY      Allergies  Allergen Reactions  . Fish-Derived Products   . Pravachol [Pravastatin Sodium]     Allergies as of 04/25/2020      Reactions   Fish-derived Products    Pravachol [pravastatin Sodium]       Medication List       Accurate as of April 25, 2020  3:53 PM. If you have  any questions, ask your nurse or doctor.        acetaminophen 500 MG tablet Commonly known as: TYLENOL Take 1,000 mg by mouth every 8 (eight) hours as needed.   CALCIUM-VITAMIN D PO Take 1 tablet by mouth in the morning and at bedtime. 616m (1,5039m - 400 unit   clonazePAM 0.5 MG tablet Commonly known as: KLONOPIN Take 0.5 mg by mouth at bedtime.   divalproex 250 MG DR tablet Commonly known as: DEPAKOTE Take 250 mg by mouth every 8 (eight) hours.   donepezil 10 MG tablet Commonly known as: ARICEPT Take 10 mg by mouth at bedtime.   memantine 10 MG tablet Commonly known as: NAMENDA Take 10 mg by mouth 2 (two) times daily.   POLYVINYL ALCOHOL-POVIDONE OP Apply to eye 3 (three) times daily. drops; 0.5-0.6 %; amt: 2 drops each eye; ophthalmic (eye)   QUEtiapine 25 MG tablet Commonly known as: SEROQUEL Take 12.5 mg by mouth daily.   QUEtiapine 25 MG tablet Commonly known as: SEROQUEL Take 25 mg by mouth at bedtime.   triamcinolone 0.1 % Commonly known as: KENALOG Apply 1 application topically. Apply to rash on ankles twice a day until healed.       Review of Systems  Constitutional: Positive for unexpected weight change. Negative for fatigue and fever.       Weight gained about #5-6 Ibs in  the past month  HENT: Positive for hearing loss. Negative for congestion and voice change.   Eyes: Negative for visual disturbance.       Dry eyes  Respiratory: Negative for cough and shortness of breath.   Cardiovascular: Negative for leg swelling.  Gastrointestinal: Negative for abdominal pain and constipation.  Genitourinary: Negative for dysuria and urgency.  Musculoskeletal: Positive for arthralgias and gait problem.       Right shoulder pain is improved.   Skin: Negative for color change.  Neurological: Negative for facial asymmetry, speech difficulty and weakness.       Dementia  Psychiatric/Behavioral: Positive for agitation, behavioral problems, confusion and sleep  disturbance. The patient is nervous/anxious.        Improved mood/behaviors.     Immunization History  Administered Date(s) Administered  . Influenza-Unspecified 02/26/2019, 03/07/2020  . Moderna SARS-COVID-2 Vaccination 05/28/2019, 06/25/2019  . Pneumococcal-Unspecified 05/22/2016   Pertinent  Health Maintenance Due  Topic Date Due  . DEXA SCAN  Never done  . PNA vac Low Risk Adult (2 of 2 - PCV13) 05/22/2017  . INFLUENZA VACCINE  Completed   No flowsheet data found. Functional Status Survey:    Vitals:   04/25/20 0906  BP: (!) 146/76  Pulse: 78  Resp: 20  Temp: 98.6 F (37 C)  SpO2: 98%  Weight: 162 lb (73.5 kg)  Height: _0  (1.676 m)   Body mass index is 26.15 kg/m. Physical Exam Vitals and nursing note reviewed.  Constitutional:      Appearance: Normal appearance.  HENT:     Head: Normocephalic and atraumatic.     Mouth/Throat:     Mouth: Mucous membranes are moist.  Eyes:     Extraocular Movements: Extraocular movements intact.     Conjunctiva/sclera: Conjunctivae normal.     Pupils: Pupils are equal, round, and reactive to light.  Cardiovascular:     Rate and Rhythm: Normal rate and regular rhythm.     Heart sounds: No murmur heard.   Pulmonary:     Effort: Pulmonary effort is normal.     Breath sounds: No rales.  Abdominal:     Palpations: Abdomen is soft.     Tenderness: There is no abdominal tenderness.  Musculoskeletal:     Cervical back: Normal range of motion and neck supple.     Right lower leg: No edema.     Left lower leg: No edema.  Skin:    General: Skin is warm and dry.  Neurological:     General: No focal deficit present.     Mental Status: She is alert. Mental status is at baseline.     Gait: Gait abnormal.     Comments: Oriented to person, long term memory about her mom, dad, education, her occupation, home etc seems intact  Psychiatric:     Comments: Confused, pleasant, conversed     Labs reviewed: Recent Labs     01/18/20 0000 01/24/20 0000 03/28/20 0000  NA 132* 132* 133*  K 4.3 4.3 4.6  CL 96* 96* 96*  CO2 25* 29* 25*  BUN _1 CREATININE 0.9 0.9 1.0  CALCIUM 8.8 8.5* 9.4   Recent Labs    12/27/19 0000 01/24/20 0000 03/28/20 0000  AST 12* 12* 12*  ALT 6* 8 6*  ALKPHOS 58 51 66  ALBUMIN 3.5 3.2* 3.7   Recent Labs    07/12/19 0000 09/15/19 0000 11/01/19 0000 11/01/19 0000 12/27/19 0000 01/24/20 0000 03/28/20 0000  WBC  6.6   < > 6.9   < > 6.1 4.9 5.4  NEUTROABS 4,158  --  4,333  --   --   --  3,202.00  HGB 12.9   < > 13.4   < > 11.9* 11.4* 13.2  HCT 40   < > 40   < > 37 35* 40  PLT 231   < > 217  --   --  177 199   < > = values in this interval not displayed.   Lab Results  Component Value Date   TSH 5.78 03/28/2020   No results found for: HGBA1C No results found for: CHOL, HDL, LDLCALC, LDLDIRECT, TRIG, CHOLHDL  Significant Diagnostic Results in last 30 days:  No results found.  Assessment/Plan Elevated TSH 03/27/20 wbc 5.4, Hgb 13.2, plt 199, neutrophils 59.3, Na 133, K 4.6, Bun 17, creat 1.03, eGFR 50, TSH 5.78. repeat TSH 6 weeks.    Hyponatremia Stable, last Na 133 03/28/20  Weight gain About #6Ibs in the past month, no s/s of fluid retention, will continue diet, exercise, observe. Close monitoring slightly elevated TSH  Depression with anxiety Her mood is stable, continue Depakote, Seroquel, Clonazepam  Dementia (HCC) Dementia, resides in memory care unit [facility], takes Memantine, Donepezil     Family/ staff Communication: plan of care reviewed with the patient and charge nurse.   Labs/tests ordered:  none  Time spend 35 minutes.

## 2020-04-25 NOTE — Assessment & Plan Note (Signed)
Stable, last Na 133 03/28/20

## 2020-05-01 ENCOUNTER — Non-Acute Institutional Stay (SKILLED_NURSING_FACILITY): Payer: Medicare Other | Admitting: Internal Medicine

## 2020-05-01 ENCOUNTER — Encounter: Payer: Self-pay | Admitting: Internal Medicine

## 2020-05-01 DIAGNOSIS — G301 Alzheimer's disease with late onset: Secondary | ICD-10-CM | POA: Diagnosis not present

## 2020-05-01 DIAGNOSIS — F0281 Dementia in other diseases classified elsewhere with behavioral disturbance: Secondary | ICD-10-CM

## 2020-05-01 DIAGNOSIS — N3000 Acute cystitis without hematuria: Secondary | ICD-10-CM

## 2020-05-01 NOTE — Progress Notes (Signed)
Location:   [facility] Room Number: 103 Place of Service:  SNF [phone]) Provider:  Einar Crow MD  [name], MD  Patient Care Team: [name], MD as PCP - General (Internal Medicine)  Extended Emergency Contact Information Primary Emergency Contact: [name] Mobile Phone: [phone] Relation: Relative  Code Status:  DNR Goals of care: Advanced Directive information Advanced Directives 03/21/2020  Does Patient Have a Medical Advance Directive? Yes  Type of Estate agent of [place];Living will  Does patient want to make changes to medical advance directive? No - Patient declined  Copy of Healthcare Power of Attorney in Chart? Yes - validated most recent copy scanned in chart (See row information)  Pre-existing out of facility DNR order (yellow form or pink MOST form) -     Chief Complaint  Patient presents with  . Acute Visit    UTI    HPI:  Sarah Larsen is a 86 y.o. female seen today for an acute visit for UTI   Patient is a long-term resident of Memory unit Patient has h/odementiano recentimaging Patient refuses, hyperlipidemia, history of unstable gait  Had Urine was checked due to Dysuria Frequency and Urgency No Lower abdominal pain. No Fever or chills.  Continues to stay confused Exit seeking. But more Manageable now     Past Medical History:  Diagnosis Date  . Arthritis   . Dementia (HCC)   . Hx of concussion    [facility] [place]  . Hyperlipidemia   . Vitamin D deficiency, unspecified    Past Surgical History:  Procedure Laterality Date  . CHOLECYSTECTOMY      Allergies  Allergen Reactions  . Fish-Derived Products   . Pravachol [Pravastatin Sodium]     Allergies as of 05/01/2020      Reactions   Fish-derived Products    Pravachol [pravastatin Sodium]       Medication List       Accurate as of May 01, 2020 10:15 AM. If you have any questions, ask your  nurse or doctor.        acetaminophen 500 MG tablet Commonly known as: TYLENOL Take 1,000 mg by mouth every 8 (eight) hours as needed.   CALCIUM-VITAMIN D PO Take 1 tablet by mouth in the morning and at bedtime. 600mg  (1,500mg ) - 400 unit   clonazePAM 0.5 MG tablet Commonly known as: KLONOPIN Take 0.5 mg by mouth at bedtime.   divalproex 125 MG capsule Commonly known as: DEPAKOTE SPRINKLE Take 250 mg by mouth 3 (three) times daily. What changed: Another medication with the same name was removed. Continue taking this medication, and follow the directions you see here. Changed by: , MD   donepezil 10 MG tablet Commonly known as: ARICEPT Take 10 mg by mouth at bedtime.   memantine 10 MG tablet Commonly known as: NAMENDA Take 10 mg by mouth 2 (two) times daily.   POLYVINYL ALCOHOL-POVIDONE OP Apply to eye 3 (three) times daily. drops; 0.5-0.6 %; amt: 2 drops each eye; ophthalmic (eye)   QUEtiapine 25 MG tablet Commonly known as: SEROQUEL Take 12.5 mg by mouth daily.   QUEtiapine 25 MG tablet Commonly known as: SEROQUEL Take 25 mg by mouth at bedtime.   triamcinolone 0.1 % Commonly known as: KENALOG Apply 1 application topically. Apply to rash on ankles twice a day until healed.       Review of Systems  Unable to perform ROS: Dementia  Immunization History  Administered Date(s) Administered  . Influenza-Unspecified 02/26/2019, 03/07/2020  . Moderna SARS-COVID-2 Vaccination 05/28/2019, 06/25/2019  . Pneumococcal-Unspecified 05/22/2016   Pertinent  Health Maintenance Due  Topic Date Due  . DEXA SCAN  Never done  . PNA vac Low Risk Adult (2 of 2 - PCV13) 05/22/2017  . INFLUENZA VACCINE  Completed   No flowsheet data found. Functional Status Survey:    Vitals:   05/01/20 1011  BP: 102/62  Pulse: 86  Resp: 18  Temp: 98.8 F (37.1 C)  SpO2: 93%  Weight: 161 lb 6.4 oz (73.2 kg)  Height: 5\' 6"  (1.676 m)   Body mass index is 26.05  kg/m. Physical Exam  Constitutional: . Well-developed and well-nourished.  HENT:  Head: Normocephalic.  Mouth/Throat: Oropharynx is clear and moist.  Eyes: Pupils are equal, round, and reactive to light.  Neck: Neck supple.  Cardiovascular: Normal rate and normal heart sounds.  No murmur heard. Pulmonary/Chest: Effort normal and breath sounds normal. No respiratory distress. No wheezes. She has no rales.  Abdominal: Soft. Bowel sounds are normal. No distension. There is no tenderness. There is no rebound.  Musculoskeletal: No edema.  Lymphadenopathy: none Neurological:No Focal Deficits.  Skin: Skin is warm and dry.  Psychiatric: Normal mood and affect. Behavior is normal. Thought content normal.    Labs reviewed: Recent Labs    01/18/20 0000 01/24/20 0000 03/28/20 0000  NA 132* 132* 133*  K 4.3 4.3 4.6  CL 96* 96* 96*  CO2 25* 29* 25*  BUN 14 10 17   CREATININE 0.9 0.9 1.0  CALCIUM 8.8 8.5* 9.4   Recent Labs    12/27/19 0000 01/24/20 0000 03/28/20 0000  AST 12* 12* 12*  ALT 6* 8 6*  ALKPHOS 58 51 66  ALBUMIN 3.5 3.2* 3.7   Recent Labs    07/12/19 0000 09/15/19 0000 11/01/19 0000 11/01/19 0000 12/27/19 0000 01/24/20 0000 03/28/20 0000  WBC 6.6   < > 6.9   < > 6.1 4.9 5.4  NEUTROABS 4,158  --  4,333  --   --   --  3,202.00  HGB 12.9   < > 13.4   < > 11.9* 11.4* 13.2  HCT 40   < > 40   < > 37 35* 40  PLT 231   < > 217  --   --  177 199   < > = values in this interval not displayed.   Lab Results  Component Value Date   TSH 5.78 03/28/2020   No results found for: HGBA1C No results found for: CHOL, HDL, LDLCALC, LDLDIRECT, TRIG, CHOLHDL  Significant Diagnostic Results in last 30 days:  No results found.  Assessment/Plan Acute cystitis without hematuria Start on Nitro Furantoin 100 mg BID for 7 days  Late onset Alzheimer's dementia with behavioral disturbance (HCC) Namenda and Aricept with Depakote and Seroquel Depression with Anxiety On  Klonopin Did not do well on Zoloft Hyperlipidemia, unspecified hyperlipidemia type Is listed allergic to statin Vitamin D insufficiency On Supplement   Family/ staff Communication:   Labs/tests ordered:

## 2020-05-02 ENCOUNTER — Emergency Department (HOSPITAL_COMMUNITY)
Admission: EM | Admit: 2020-05-02 | Discharge: 2020-05-03 | Disposition: A | Discharge status: Home / Self Care | Payer: Medicare Other | Attending: Emergency Medicine | Admitting: Emergency Medicine

## 2020-05-02 ENCOUNTER — Emergency Department (HOSPITAL_COMMUNITY): Payer: Medicare Other

## 2020-05-02 ENCOUNTER — Other Ambulatory Visit: Payer: Self-pay

## 2020-05-02 ENCOUNTER — Encounter (HOSPITAL_COMMUNITY): Payer: Self-pay

## 2020-05-02 DIAGNOSIS — E860 Dehydration: Secondary | ICD-10-CM | POA: Insufficient documentation

## 2020-05-02 DIAGNOSIS — F0390 Unspecified dementia without behavioral disturbance: Secondary | ICD-10-CM | POA: Insufficient documentation

## 2020-05-02 DIAGNOSIS — Y92009 Unspecified place in unspecified non-institutional (private) residence as the place of occurrence of the external cause: Secondary | ICD-10-CM | POA: Insufficient documentation

## 2020-05-02 DIAGNOSIS — W19XXXA Unspecified fall, initial encounter: Secondary | ICD-10-CM | POA: Insufficient documentation

## 2020-05-02 DIAGNOSIS — R5383 Other fatigue: Secondary | ICD-10-CM | POA: Diagnosis present

## 2020-05-02 LAB — BASIC METABOLIC PANEL
Anion gap: 8 (ref 5–15)
BUN: 27 mg/dL — ABNORMAL HIGH (ref 8–23)
CO2: 28 mmol/L (ref 22–32)
Calcium: 9.4 mg/dL (ref 8.9–10.3)
Chloride: 98 mmol/L (ref 98–111)
Creatinine, Ser: 1.66 mg/dL — ABNORMAL HIGH (ref 0.44–1.00)
GFR, Estimated: 30 mL/min — ABNORMAL LOW (ref >60)
Glucose, Bld: 118 mg/dL — ABNORMAL HIGH (ref 70–99)
Potassium: 3.6 mmol/L (ref 3.5–5.1)
Sodium: 134 mmol/L — ABNORMAL LOW (ref 135–145)

## 2020-05-02 LAB — CBC WITH DIFFERENTIAL/PLATELET
Abs Immature Granulocytes: 0.04 10*3/uL (ref 0.00–0.07)
Basophils Absolute: 0 10*3/uL (ref 0.0–0.1)
Basophils Relative: 1 %
Eosinophils Absolute: 0 10*3/uL (ref 0.0–0.5)
Eosinophils Relative: 0 %
HCT: 36.1 % (ref 36.0–46.0)
Hemoglobin: 11.4 g/dL — ABNORMAL LOW (ref 12.0–15.0)
Immature Granulocytes: 1 %
Lymphocytes Relative: 2 %
Lymphs Abs: 0.1 10*3/uL — ABNORMAL LOW (ref 0.7–4.0)
MCH: 29.4 pg (ref 26.0–34.0)
MCHC: 31.6 g/dL (ref 30.0–36.0)
MCV: 93 fL (ref 80.0–100.0)
Monocytes Absolute: 0.6 10*3/uL (ref 0.1–1.0)
Monocytes Relative: 8 %
Neutro Abs: 6.5 10*3/uL (ref 1.7–7.7)
Neutrophils Relative %: 88 %
Platelets: 132 10*3/uL — ABNORMAL LOW (ref 150–400)
RBC: 3.88 MIL/uL (ref 3.87–5.11)
RDW: 14.6 % (ref 11.5–15.5)
WBC: 7.3 10*3/uL (ref 4.0–10.5)
nRBC: 0 % (ref 0.0–0.2)

## 2020-05-02 MED ORDER — SODIUM CHLORIDE 0.9 % IV BOLUS
1000.0000 mL | Freq: Once | INTRAVENOUS | Status: AC
  Administered 2020-05-02: 1000 mL via INTRAVENOUS

## 2020-05-02 NOTE — ED Provider Notes (Signed)
[facility] EMERGENCY DEPARTMENT Provider Note   CSN: [id number] Arrival date & time: 05/02/20  2142     History Chief Complaint  Patient presents with  . Fall  . Fatigue    Sarah Larsen is a 86 y.o. female with a past medical history of dementia presenting to the ED after fall that occurred prior to arrival.  She had a witnessed fall around 7:30 PM today.  She had no injuries after the fall and no complaints of pain or discomfort.  However shortly thereafter the staff noticed that she has not been ambulating or performing ADLs since the fall.  She denies any headache, blurry vision, numbness in arms or legs, neck pain, back pain.  History is limited secondary to dementia. Facility states that patient is being treated for a UTI currently.   HPI     Past Medical History:  Diagnosis Date  . Arthritis   . Dementia (HCC)   . Hx of concussion    [facility]  . Hyperlipidemia   . Vitamin D deficiency, unspecified     Patient Active Problem List   Diagnosis Date Noted  . Elevated TSH 03/28/2020  . Weight gain 03/22/2020  . Hyponatremia 01/18/2020  . Depression with anxiety 01/05/2020  . Fall 12/28/2019  . Dry eyes 11/09/2019  . Dizziness 08/24/2019  . Agitation 08/24/2019  . Dementia (HCC) 01/15/2019  . Hyperlipidemia 01/15/2019  . Vitamin D insufficiency 01/15/2019  . Unsteady gait 01/15/2019    Past Surgical History:  Procedure Laterality Date  . CHOLECYSTECTOMY       OB History   No obstetric history on file.     No family history on file.  Social History   Tobacco Use  . Smoking status: Not on file  Vaping Use  . Vaping Use: Never used  Substance Use Topics  . Alcohol use: Never  . Drug use: Never    Home Medications Prior to Admission medications   Medication Sig Start Date End Date Taking? Authorizing Provider  acetaminophen (TYLENOL) 500 MG tablet Take 1,000 mg by mouth every 8 (eight) hours as needed.     [provider]  CALCIUM-VITAMIN D PO Take 1 tablet by mouth in the morning and at bedtime. 600mg  (1,500mg ) - 400 unit    [provider]  clonazePAM (KLONOPIN) 0.5 MG tablet Take 0.5 mg by mouth at bedtime.    [provider]  divalproex (DEPAKOTE SPRINKLE) 125 MG capsule Take 250 mg by mouth 3 (three) times daily.    [provider]  donepezil (ARICEPT) 10 MG tablet Take 10 mg by mouth at bedtime.     [provider]  memantine (NAMENDA) 10 MG tablet Take 10 mg by mouth 2 (two) times daily.    [provider]  POLYVINYL ALCOHOL-POVIDONE OP Apply to eye 3 (three) times daily. drops; 0.5-0.6 %; amt: 2 drops each eye; ophthalmic (eye)    [provider]  QUEtiapine (SEROQUEL) 25 MG tablet Take 12.5 mg by mouth daily.     [provider]  QUEtiapine (SEROQUEL) 25 MG tablet Take 25 mg by mouth at bedtime.    [provider]  triamcinolone cream (KENALOG) 0.1 % Apply 1 application topically. Apply to rash on ankles twice a day until healed.    [provider]    Allergies    Fish-derived products and Pravachol [pravastatin sodium]  Review of Systems   Review of Systems  Unable to perform ROS:  Dementia  Neurological: Negative for headaches.    Physical Exam Updated Vital Signs BP (!) 104/50   Pulse 72   Temp 98.5 F (36.9 C) (Oral)   Resp 18   Ht 5\' 6"  (1.676 m)   Wt 73.2 kg   SpO2 95%   BMI 26.05 kg/m   Physical Exam Vitals and nursing note reviewed.  Constitutional:      General: She is not in acute distress.    Appearance: She is well-developed.  HENT:     Head: Normocephalic and atraumatic.     Nose: Nose normal.  Eyes:     General: No scleral icterus.       Right eye: No discharge.        Left eye: No discharge.     Conjunctiva/sclera: Conjunctivae normal.     Pupils: Pupils are equal, round, and reactive to light.  Cardiovascular:     Rate and Rhythm: Normal rate and regular  rhythm.     Heart sounds: Normal heart sounds. No murmur heard.  No friction rub. No gallop.   Pulmonary:     Effort: Pulmonary effort is normal. No respiratory distress.     Breath sounds: Normal breath sounds.  Abdominal:     General: Bowel sounds are normal. There is no distension.     Palpations: Abdomen is soft.     Tenderness: There is no abdominal tenderness. There is no guarding.  Musculoskeletal:        General: Normal range of motion.     Cervical back: Normal range of motion and neck supple.     Comments: No tenderness of bilateral hips, C, T or L-spine.  Skin:    General: Skin is warm and dry.     Findings: No rash.  Neurological:     Mental Status: She is alert.     Sensory: No sensory deficit.     Motor: No weakness or abnormal muscle tone.     Coordination: Coordination normal.     Comments: Alert, oriented to self.  This is her baseline.  Moving all extremities without difficulty.  Strength 5/5 in bilateral upper and lower extremities.  Normal sensation to light touch.  No facial asymmetry noted.     ED Results / Procedures / Treatments   Labs (all labs ordered are listed, but only abnormal results are displayed) Labs Reviewed  BASIC METABOLIC PANEL - Abnormal; Notable for the following components:      Result Value   Sodium 134 (*)    Glucose, Bld 118 (*)    BUN 27 (*)    Creatinine, Ser 1.66 (*)    GFR, Estimated 30 (*)    All other components within normal limits  CBC WITH DIFFERENTIAL/PLATELET - Abnormal; Notable for the following components:   Hemoglobin 11.4 (*)    Platelets 132 (*)    Lymphs Abs 0.1 (*)    All other components within normal limits    EKG None  Radiology CT Head Wo Contrast  Result Date: 05/02/2020 CLINICAL DATA:  Status post fall. EXAM: CT HEAD WITHOUT CONTRAST CT CERVICAL SPINE WITHOUT CONTRAST TECHNIQUE: Multidetector CT imaging of the head and cervical spine was performed following the standard protocol without intravenous  contrast. Multiplanar CT image reconstructions of the cervical spine were also generated. COMPARISON:  None. FINDINGS: CT HEAD FINDINGS Brain: There is moderate severity cerebral atrophy with widening of the extra-axial spaces and ventricular dilatation. There are areas of decreased attenuation within the white matter  tracts of the supratentorial brain, consistent with microvascular disease changes. Vascular: No hyperdense vessel or unexpected calcification. Skull: Normal. Negative for fracture or focal lesion. Sinuses/Orbits: No acute finding. Other: None. CT CERVICAL SPINE FINDINGS Alignment: Normal. Skull base and vertebrae: No acute fracture. No primary bone lesion or focal pathologic process. Soft tissues and spinal canal: No prevertebral fluid or swelling. No visible canal hematoma. Disc levels: Marked severity endplate sclerosis is seen at the levels of C4-C5 and C5-C6, with mild anterior osteophyte formation noted at the levels of C2-C3 C6-C7 and C7-T1. Marked severity intervertebral disc space narrowing is seen at the levels of C4-C5 and C5-C6. Marked severity bilateral multilevel facet joint hypertrophy is noted. Upper chest: Mild biapical scarring and/or atelectasis is seen. Other: N/A IMPRESSION: 1. Moderate severity cerebral atrophy and microvascular disease changes of the supratentorial brain. 2. Marked severity degenerative changes of the cervical spine without evidence of an acute fracture or subluxation. Electronically Signed   By: Aram Candela M.D.   On: 05/02/2020 22:22   CT Cervical Spine Wo Contrast  Result Date: 05/02/2020 CLINICAL DATA:  Status post fall. EXAM: CT HEAD WITHOUT CONTRAST CT CERVICAL SPINE WITHOUT CONTRAST TECHNIQUE: Multidetector CT imaging of the head and cervical spine was performed following the standard protocol without intravenous contrast. Multiplanar CT image reconstructions of the cervical spine were also generated. COMPARISON:  None. FINDINGS: CT HEAD FINDINGS  Brain: There is moderate severity cerebral atrophy with widening of the extra-axial spaces and ventricular dilatation. There are areas of decreased attenuation within the white matter tracts of the supratentorial brain, consistent with microvascular disease changes. Vascular: No hyperdense vessel or unexpected calcification. Skull: Normal. Negative for fracture or focal lesion. Sinuses/Orbits: No acute finding. Other: None. CT CERVICAL SPINE FINDINGS Alignment: Normal. Skull base and vertebrae: No acute fracture. No primary bone lesion or focal pathologic process. Soft tissues and spinal canal: No prevertebral fluid or swelling. No visible canal hematoma. Disc levels: Marked severity endplate sclerosis is seen at the levels of C4-C5 and C5-C6, with mild anterior osteophyte formation noted at the levels of C2-C3 C6-C7 and C7-T1. Marked severity intervertebral disc space narrowing is seen at the levels of C4-C5 and C5-C6. Marked severity bilateral multilevel facet joint hypertrophy is noted. Upper chest: Mild biapical scarring and/or atelectasis is seen. Other: N/A IMPRESSION: 1. Moderate severity cerebral atrophy and microvascular disease changes of the supratentorial brain. 2. Marked severity degenerative changes of the cervical spine without evidence of an acute fracture or subluxation. Electronically Signed   By: Aram Candela M.D.   On: 05/02/2020 22:24    Procedures Procedures (including critical care time)  Medications Ordered in ED Medications  sodium chloride 0.9 % bolus 1,000 mL (1,000 mLs Intravenous New Bag/Given 05/02/20 2325)    ED Course  I have reviewed the triage vital signs and the nursing notes.  Pertinent labs & imaging results that were available during my care of the patient were reviewed by me and considered in my medical decision making (see chart for details).    MDM Rules/Calculators/A&P                          86 year old female with a past medical history of dementia  presenting to the ED after fall.  She had a witnessed fall around 7:30 PM.  She had no injuries after the fall no complaints of pain but was sent to the ER because she has not ambulated or perform ADLs since  then.  Patient states that she feels well.  She denies any headache, blurry vision, numbness in arms or legs, neck or back pain but she is also unsure why she is here.  No wounds noted on exam.  No tenderness of the CT or L-spine.  She is alert, oriented to self.  She is moving all extremities without difficulty.  She has normal strength and sensation of bilateral upper and lower extremities.  She has no facial asymmetry noted.  CT of the head and cervical spine without any abnormalities, does show degenerative changes.  CBC unremarkable, BMP significant for creatinine of 1.6, her baseline appears around 1.  Her blood pressures are in the upper 90s-100s systolic.  We will give her IV fluids and reassess and attempt to ambulate. Her symptoms could be due to her dehydration. If improved, can discharge her home with continued home medications. Care handed off to oncoming provider pending recheck.   Portions of this note were generated with Scientist, clinical (histocompatibility and immunogenetics). Dictation errors may occur despite best attempts at proofreading.  Final Clinical Impression(s) / ED Diagnoses Final diagnoses:  Fall, initial encounter  Dehydration    Rx / DC Orders ED Discharge Orders    None       Dietrich Pates, PA-C 05/02/20 2336    Gerhard Munch, MD 05/04/20 1104

## 2020-05-02 NOTE — Discharge Instructions (Addendum)
Continue home medications as previously prescribed. Make sure you drink plenty of fluids to prevent dehydration. Return to the ER if you start to experience additional injuries or falls, headache, blurry vision, chest pain or shortness of breath.

## 2020-05-02 NOTE — ED Triage Notes (Signed)
Pt BIB [facility] from [facility] after sustaining a Fall at 1930 today.  Pt has no injuries and has no complaints of pain or discomfort.  Since fall however, staff noted the pt was more lethargic and not behaving like herself. Pt is normally ambulatory and since fall the pt has not been ambulating or performing normal ADLs.  VSS with EMS. Pt is A&O to self only (Baseline - Hx of Dementia. No injuries noted on assessment. Pt has no complaints.

## 2020-05-03 ENCOUNTER — Non-Acute Institutional Stay (SKILLED_NURSING_FACILITY): Payer: Medicare Other | Admitting: Nurse Practitioner

## 2020-05-03 ENCOUNTER — Encounter: Payer: Self-pay | Admitting: Nurse Practitioner

## 2020-05-03 DIAGNOSIS — W19XXXA Unspecified fall, initial encounter: Secondary | ICD-10-CM

## 2020-05-03 DIAGNOSIS — N390 Urinary tract infection, site not specified: Secondary | ICD-10-CM | POA: Insufficient documentation

## 2020-05-03 DIAGNOSIS — R635 Abnormal weight gain: Secondary | ICD-10-CM

## 2020-05-03 DIAGNOSIS — N3000 Acute cystitis without hematuria: Secondary | ICD-10-CM

## 2020-05-03 DIAGNOSIS — R2681 Unsteadiness on feet: Secondary | ICD-10-CM | POA: Diagnosis not present

## 2020-05-03 DIAGNOSIS — N179 Acute kidney failure, unspecified: Secondary | ICD-10-CM | POA: Diagnosis not present

## 2020-05-03 DIAGNOSIS — F0151 Vascular dementia with behavioral disturbance: Secondary | ICD-10-CM

## 2020-05-03 DIAGNOSIS — E871 Hypo-osmolality and hyponatremia: Secondary | ICD-10-CM

## 2020-05-03 DIAGNOSIS — R531 Weakness: Secondary | ICD-10-CM

## 2020-05-03 DIAGNOSIS — F01518 Vascular dementia, unspecified severity, with other behavioral disturbance: Secondary | ICD-10-CM

## 2020-05-03 DIAGNOSIS — F418 Other specified anxiety disorders: Secondary | ICD-10-CM

## 2020-05-03 LAB — URINALYSIS, ROUTINE W REFLEX MICROSCOPIC
Bilirubin Urine: NEGATIVE
Glucose, UA: NEGATIVE mg/dL
Hgb urine dipstick: NEGATIVE
Ketones, ur: NEGATIVE mg/dL
Leukocytes,Ua: NEGATIVE
Nitrite: NEGATIVE
Protein, ur: NEGATIVE mg/dL
Specific Gravity, Urine: 1.027 (ref 1.005–1.030)
pH: 5 (ref 5.0–8.0)

## 2020-05-03 LAB — LACTIC ACID, PLASMA: Lactic Acid, Venous: 1.3 mmol/L (ref 0.5–1.9)

## 2020-05-03 MED ORDER — SODIUM CHLORIDE 0.9 % IV BOLUS
1000.0000 mL | Freq: Once | INTRAVENOUS | Status: AC
  Administered 2020-05-03: 1000 mL via INTRAVENOUS

## 2020-05-03 MED ORDER — SODIUM CHLORIDE 0.9 % IV SOLN
1.0000 g | Freq: Once | INTRAVENOUS | Status: AC
  Administered 2020-05-03: 1 g via INTRAVENOUS
  Filled 2020-05-03: qty 10

## 2020-05-03 NOTE — Assessment & Plan Note (Signed)
Weight gain, about #6Ibs in the past month, no apparent swelling, 03/28/20 TSH 5.78, CBC/CMP unremarkable, repeat TSH in 6 weeks scheduled.

## 2020-05-03 NOTE — ED Notes (Signed)
Patient verbalizes understanding of discharge instructions. Opportunity for questioning and answers were provided. Armband removed by staff, pt discharged from ED via PTAR.  

## 2020-05-03 NOTE — Assessment & Plan Note (Signed)
Dehydration Na 134, Bun 28, creat 1.66(baseline 1), encourage oral fluid intake, repeat BMP

## 2020-05-03 NOTE — ED Notes (Signed)
Secretary to call PTAR   

## 2020-05-03 NOTE — Assessment & Plan Note (Signed)
UTI is the culprit, will decrease Seroquel to 25mg  hs, Depakote 25mg  bid to eliminate possible contributory side effects. The patient's oral intakes is better today, improving her physical strength. Pending BMP

## 2020-05-03 NOTE — Assessment & Plan Note (Signed)
Anxiety/depression, takes Seroquel, Depakote, Clonazepam

## 2020-05-03 NOTE — Assessment & Plan Note (Signed)
Dementia, resides in memory care unit [facility], takes Memantine, Donepezil ° °

## 2020-05-03 NOTE — Assessment & Plan Note (Signed)
UTI, E Coli, on 7 day course of Nitrofurantoin started 05/01/20

## 2020-05-03 NOTE — ED Notes (Signed)
Attempted report X1

## 2020-05-03 NOTE — ED Notes (Signed)
Cancelled PTAR for pt. Per RN.

## 2020-05-03 NOTE — ED Provider Notes (Addendum)
12:40 AM Patient ambulated with assistance; usually uses a walker at home. IVF completed. VSS. Appropriate for discharge and outpatient PCP followup. Return precautions provided. Patient discharged in stable condition with no unaddressed concerns.   Antony Madura, PA-C 05/03/20 0041  2:22 AM Patient sleeping pending PTAR transport. BP has downtrended slightly. Repeat temp 98.61F. Given recent tx of UTI will obtain lactate, blood cultures, UA, urine culture. PTAR canceled for now. Will continue to hydrate, administer IV abx.  2:54 AM Lactate normal at 1.3  3:54 AM UA not c/w infection. No SIRS or criteria for sepsis at this time.   5:20 AM No clinical decompensation over prolonged observation.  Vitals remained stable.  We will proceed with transfer back to facility.    Antony Madura, PA-C 05/03/20 5462    Alvira Monday, MD 05/03/20 2221

## 2020-05-03 NOTE — Progress Notes (Signed)
Location:    [facility] Room Number: 103 Place of Service:  SNF (31) Provider: Arna SnipeManXie Samrat Hayward NP  Mahlon GammonGupta, Anjali L, MD  Patient Care Team: Mahlon GammonGupta, Anjali L, MD as PCP - General (Internal Medicine)  Extended Emergency Contact Information Primary Emergency Contact: Jearld LeschJeffreys, Margaret Mobile Phone: [phone] Relation: Relative  Code Status: DNR Goals of care: Advanced Directive information Advanced Directives 05/02/2020  Does Patient Have a Medical Advance Directive? Yes  Type of Advance Directive -  Does patient want to make changes to medical advance directive? No - Patient declined  Copy of Healthcare Power of Attorney in Chart? -  Pre-existing out of facility DNR order (yellow form or pink MOST form) -     Chief Complaint  Patient presents with  . Acute Visit    s/p fall, s/p ED eval, generalized weakness.    HPI:  Pt is a 86 y.o. female seen today for an acute visit for ED eval for fall, Dehydration Na 134, Bun 28, creat 1.66(baseline 1), UTI, received Rocephin 1gm x1 NS fluid resuscitation in ED.   Generalized weakness, no focal neurological deficit, CT head/cervical spine 05/02/20 IMPRESSION: 1. Moderate severity cerebral atrophy and microvascular disease changes of the supratentorial brain. 2. Marked severity degenerative changes of the cervical spine without evidence of an acute fracture or subluxation.   UTI, E Coli, on 7 day course of Nitrofurantoin started 05/01/20    Dementia, [facility], takes Memantine, Donepezil Anxiety/depression, takes Seroquel, Depakote, Clonazepam              Weight gain, about #6Ibs in the past month, no apparent swelling, 03/28/20 TSH 5.78, CBC/CMP unremarkable, repeat TSH in 6 weeks scheduled.              Hyponatremia, Na 134 05/02/20     Past Medical History:  Diagnosis Date  . Arthritis   . Dementia (HCC)   . Hx of concussion    [facility]  .  Hyperlipidemia   . Vitamin D deficiency, unspecified    Past Surgical History:  Procedure Laterality Date  . CHOLECYSTECTOMY      Allergies  Allergen Reactions  . Fish-Derived Products   . Pravachol [Pravastatin Sodium]     Allergies as of 05/03/2020      Reactions   Fish-derived Products    Pravachol [pravastatin Sodium]       Medication List       Accurate as of May 03, 2020 11:59 PM. If you have any questions, ask your nurse or doctor.        acetaminophen 500 MG tablet Commonly known as: TYLENOL Take 1,000 mg by mouth every 8 (eight) hours as needed.   CALCIUM-VITAMIN D PO Take 1 tablet by mouth in the morning and at bedtime. 600mg  (1,500mg ) - 400 unit   clonazePAM 0.5 MG tablet Commonly known as: KLONOPIN Take 0.5 mg by mouth at bedtime.   divalproex 125 MG capsule Commonly known as: DEPAKOTE SPRINKLE Take 250 mg by mouth 3 (three) times daily.   donepezil 10 MG tablet Commonly known as: ARICEPT Take 10 mg by mouth at bedtime.   memantine 10 MG tablet Commonly known as: NAMENDA Take 10 mg by mouth 2 (two) times daily.   nitrofurantoin (macrocrystal-monohydrate) 100 MG capsule Commonly known as: MACROBID Take 100 mg by mouth 2 (two) times daily.   POLYVINYL ALCOHOL-POVIDONE OP Place 2 drops into both eyes 3 (three) times daily. drops; 0.5-0.6 %;  amt: 2 drops each eye; ophthalmic (eye)   QUEtiapine 25 MG tablet Commonly known as: SEROQUEL Take 12.5 mg by mouth daily.   QUEtiapine 25 MG tablet Commonly known as: SEROQUEL Take 25 mg by mouth at bedtime.   saccharomyces boulardii 250 MG capsule Commonly known as: FLORASTOR Take 250 mg by mouth 2 (two) times daily. Pt is taking while on antibiotic   triamcinolone 0.1 % Commonly known as: KENALOG Apply 1 application topically. Apply to rash on ankles twice a day until healed.       Review of Systems  Constitutional: Positive for activity change, appetite change, fatigue and unexpected  weight change. Negative for fever.       Weight gained about #5-6 Ibs in the past month  HENT: Positive for hearing loss. Negative for congestion and voice change.   Eyes: Negative for visual disturbance.       Dry eyes  Respiratory: Negative for cough and shortness of breath.   Cardiovascular: Negative for leg swelling.  Gastrointestinal: Negative for abdominal pain and constipation.  Genitourinary: Negative for dysuria and urgency.  Musculoskeletal: Positive for arthralgias and gait problem.       Right shoulder pain is improved.   Skin: Negative for color change.  Neurological: Negative for facial asymmetry, speech difficulty and weakness.       Dementia  Psychiatric/Behavioral: Positive for confusion. Negative for agitation, behavioral problems and sleep disturbance. The patient is not nervous/anxious.        Fatigue.     Immunization History  Administered Date(s) Administered  . Influenza-Unspecified 02/26/2019, 03/07/2020  . Moderna SARS-COVID-2 Vaccination 05/28/2019, 06/25/2019  . Pneumococcal-Unspecified 05/22/2016   Pertinent  Health Maintenance Due  Topic Date Due  . DEXA SCAN  Never done  . PNA vac Low Risk Adult (2 of 2 - PCV13) 05/22/2017  . INFLUENZA VACCINE  Completed   No flowsheet data found. Functional Status Survey:    Vitals:   05/03/20 1343  BP: (!) 100/50  Pulse: 90  Resp: (!) 24  Temp: 98.8 F (37.1 C)  SpO2: (!) 88%  Weight: 161 lb 6.4 oz (73.2 kg)  Height: 5\' 6"  (1.676 m)   Body mass index is 26.05 kg/m. Physical Exam Vitals and nursing note reviewed.  Constitutional:      Comments: Tired, exhausted appearance.   HENT:     Head: Normocephalic and atraumatic.     Mouth/Throat:     Mouth: Mucous membranes are dry.  Eyes:     Extraocular Movements: Extraocular movements intact.     Conjunctiva/sclera: Conjunctivae normal.     Pupils: Pupils are equal, round, and reactive to light.  Cardiovascular:     Rate and Rhythm: Normal rate and  regular rhythm.     Heart sounds: No murmur heard.   Pulmonary:     Effort: Pulmonary effort is normal.     Breath sounds: No rales.  Abdominal:     Palpations: Abdomen is soft.     Tenderness: There is no abdominal tenderness.  Musculoskeletal:     Cervical back: Normal range of motion and neck supple.     Right lower leg: No edema.     Left lower leg: No edema.  Skin:    General: Skin is warm and dry.  Neurological:     General: No focal deficit present.     Mental Status: She is alert.     Gait: Gait abnormal.     Comments: Oriented to person, requested to use  bathroom during my examination.   Psychiatric:     Comments: Confused, pleasant, conversed     Labs reviewed: Recent Labs    01/24/20 0000 03/28/20 0000 05/02/20 2225  NA 132* 133* 134*  K 4.3 4.6 3.6  CL 96* 96* 98  CO2 29* 25* 28  GLUCOSE  --   --  118*  BUN 10 17 27*  CREATININE 0.9 1.0 1.66*  CALCIUM 8.5* 9.4 9.4   Recent Labs    12/27/19 0000 01/24/20 0000 03/28/20 0000  AST 12* 12* 12*  ALT 6* 8 6*  ALKPHOS 58 51 66  ALBUMIN 3.5 3.2* 3.7   Recent Labs    11/01/19 0000 12/27/19 0000 01/24/20 0000 03/28/20 0000 05/02/20 2225  WBC 6.9   < > 4.9 5.4 7.3  NEUTROABS 4,333  --   --  3,202.00 6.5  HGB 13.4   < > 11.4* 13.2 11.4*  HCT 40   < > 35* 40 36.1  MCV  --   --   --   --  93.0  PLT 217  --  177 199 132*   < > = values in this interval not displayed.   Lab Results  Component Value Date   TSH 5.78 03/28/2020   No results found for: HGBA1C No results found for: CHOL, HDL, LDLCALC, LDLDIRECT, TRIG, CHOLHDL  Significant Diagnostic Results in last 30 days:  CT Head Wo Contrast  Result Date: 05/02/2020 CLINICAL DATA:  Status post fall. EXAM: CT HEAD WITHOUT CONTRAST CT CERVICAL SPINE WITHOUT CONTRAST TECHNIQUE: Multidetector CT imaging of the head and cervical spine was performed following the standard protocol without intravenous contrast. Multiplanar CT image reconstructions of the  cervical spine were also generated. COMPARISON:  None. FINDINGS: CT HEAD FINDINGS Brain: There is moderate severity cerebral atrophy with widening of the extra-axial spaces and ventricular dilatation. There are areas of decreased attenuation within the white matter tracts of the supratentorial brain, consistent with microvascular disease changes. Vascular: No hyperdense vessel or unexpected calcification. Skull: Normal. Negative for fracture or focal lesion. Sinuses/Orbits: No acute finding. Other: None. CT CERVICAL SPINE FINDINGS Alignment: Normal. Skull base and vertebrae: No acute fracture. No primary bone lesion or focal pathologic process. Soft tissues and spinal canal: No prevertebral fluid or swelling. No visible canal hematoma. Disc levels: Marked severity endplate sclerosis is seen at the levels of C4-C5 and C5-C6, with mild anterior osteophyte formation noted at the levels of C2-C3 C6-C7 and C7-T1. Marked severity intervertebral disc space narrowing is seen at the levels of C4-C5 and C5-C6. Marked severity bilateral multilevel facet joint hypertrophy is noted. Upper chest: Mild biapical scarring and/or atelectasis is seen. Other: N/A IMPRESSION: 1. Moderate severity cerebral atrophy and microvascular disease changes of the supratentorial brain. 2. Marked severity degenerative changes of the cervical spine without evidence of an acute fracture or subluxation. Electronically Signed   By: Aram Candela M.D.   On: 05/02/2020 22:22   CT Cervical Spine Wo Contrast  Result Date: 05/02/2020 CLINICAL DATA:  Status post fall. EXAM: CT HEAD WITHOUT CONTRAST CT CERVICAL SPINE WITHOUT CONTRAST TECHNIQUE: Multidetector CT imaging of the head and cervical spine was performed following the standard protocol without intravenous contrast. Multiplanar CT image reconstructions of the cervical spine were also generated. COMPARISON:  None. FINDINGS: CT HEAD FINDINGS Brain: There is moderate severity cerebral atrophy  with widening of the extra-axial spaces and ventricular dilatation. There are areas of decreased attenuation within the white matter tracts of the supratentorial brain,  consistent with microvascular disease changes. Vascular: No hyperdense vessel or unexpected calcification. Skull: Normal. Negative for fracture or focal lesion. Sinuses/Orbits: No acute finding. Other: None. CT CERVICAL SPINE FINDINGS Alignment: Normal. Skull base and vertebrae: No acute fracture. No primary bone lesion or focal pathologic process. Soft tissues and spinal canal: No prevertebral fluid or swelling. No visible canal hematoma. Disc levels: Marked severity endplate sclerosis is seen at the levels of C4-C5 and C5-C6, with mild anterior osteophyte formation noted at the levels of C2-C3 C6-C7 and C7-T1. Marked severity intervertebral disc space narrowing is seen at the levels of C4-C5 and C5-C6. Marked severity bilateral multilevel facet joint hypertrophy is noted. Upper chest: Mild biapical scarring and/or atelectasis is seen. Other: N/A IMPRESSION: 1. Moderate severity cerebral atrophy and microvascular disease changes of the supratentorial brain. 2. Marked severity degenerative changes of the cervical spine without evidence of an acute fracture or subluxation. Electronically Signed   By: Aram Candela M.D.   On: 05/02/2020 22:24    Assessment/Plan: Acute renal injury (HCC) Dehydration Na 134, Bun 28, creat 1.66(baseline 1), encourage oral fluid intake, repeat BMP   Generalized weakness UTI is the culprit, will decrease Seroquel to 25mg  hs, Depakote 25mg  bid to eliminate possible contributory side effects. The patient's oral intakes is better today, improving her physical strength. Pending BMP  Unsteady gait Worse, UTI, psychotropic meds, AKI are contributory. Close supervision/assistance for safety, complete Nitrofurantoin for UTI, encourage oral fluid intakes, GDR of Seroquel/Depakote observe.   Fall Increased frailty  due to multiple factors. Close supervision/assistance for safety. CT head/cervical spine 05/02/20 IMPRESSION: 1. Moderate severity cerebral atrophy and microvascular disease changes of the supratentorial brain. 2. Marked severity degenerative changes of the cervical spine without evidence of an acute fracture or subluxation.    UTI (urinary tract infection) UTI, E Coli, on 7 day course of Nitrofurantoin started 05/01/20   Vascular dementia (HCC) Dementia, resides in memory care unit [facility], takes Memantine, Donepezil  Depression with anxiety Anxiety/depression, takes Seroquel, Depakote, Clonazepam    Weight gain Weight gain, about #6Ibs in the past month, no apparent swelling, 03/28/20 TSH 5.78, CBC/CMP unremarkable, repeat TSH in 6 weeks scheduled.    Hyponatremia Hyponatremia, Na 134 05/02/20     Family/ staff Communication: plan of care reviewed with the patient and charge nurse.   Labs/tests ordered:  BMP   Time spend 35 minutes.

## 2020-05-03 NOTE — ED Notes (Signed)
Called [facility] to transport pt to [facility]

## 2020-05-03 NOTE — ED Notes (Signed)
Called [facility] to transport patient to [facility].

## 2020-05-03 NOTE — ED Notes (Signed)
Pt began to become hypotensive at 80s-90s systolic. EDP Schlossman made aware and new orders to be placed. Pt placed back on cardiac monitoring and [facility] called to cancel transportation.

## 2020-05-03 NOTE — Assessment & Plan Note (Signed)
Worse, UTI, psychotropic meds, AKI are contributory. Close supervision/assistance for safety, complete Nitrofurantoin for UTI, encourage oral fluid intakes, GDR of Seroquel/Depakote observe.

## 2020-05-03 NOTE — Assessment & Plan Note (Addendum)
Increased frailty due to multiple factors. Close supervision/assistance for safety. CT head/cervical spine 05/02/20 IMPRESSION: 1. Moderate severity cerebral atrophy and microvascular disease changes of the supratentorial brain. 2. Marked severity degenerative changes of the cervical spine without evidence of an acute fracture or subluxation.

## 2020-05-03 NOTE — Assessment & Plan Note (Signed)
Hyponatremia, Na 134 05/02/20

## 2020-05-04 ENCOUNTER — Encounter: Payer: Self-pay | Admitting: Nurse Practitioner

## 2020-05-04 LAB — URINE CULTURE: Culture: <10000 — AB

## 2020-05-06 ENCOUNTER — Emergency Department (HOSPITAL_COMMUNITY): Payer: Medicare Other

## 2020-05-06 ENCOUNTER — Other Ambulatory Visit: Payer: Self-pay

## 2020-05-06 ENCOUNTER — Inpatient Hospital Stay (HOSPITAL_COMMUNITY)
Admission: EM | Admit: 2020-05-06 | Discharge: 2020-05-14 | DRG: 442 | Disposition: A | Discharge status: Skilled Nursing Facility | Payer: Medicare Other | Source: Skilled Nursing Facility | Attending: Family Medicine | Admitting: Family Medicine

## 2020-05-06 ENCOUNTER — Encounter (HOSPITAL_COMMUNITY): Payer: Self-pay | Admitting: Emergency Medicine

## 2020-05-06 ENCOUNTER — Telehealth: Payer: Self-pay | Admitting: Family

## 2020-05-06 DIAGNOSIS — Z66 Do not resuscitate: Secondary | ICD-10-CM | POA: Diagnosis present

## 2020-05-06 DIAGNOSIS — Z79899 Other long term (current) drug therapy: Secondary | ICD-10-CM | POA: Diagnosis not present

## 2020-05-06 DIAGNOSIS — E785 Hyperlipidemia, unspecified: Secondary | ICD-10-CM | POA: Diagnosis present

## 2020-05-06 DIAGNOSIS — Z87820 Personal history of traumatic brain injury: Secondary | ICD-10-CM

## 2020-05-06 DIAGNOSIS — E876 Hypokalemia: Secondary | ICD-10-CM | POA: Diagnosis present

## 2020-05-06 DIAGNOSIS — E559 Vitamin D deficiency, unspecified: Secondary | ICD-10-CM | POA: Diagnosis present

## 2020-05-06 DIAGNOSIS — Z91013 Allergy to seafood: Secondary | ICD-10-CM | POA: Diagnosis not present

## 2020-05-06 DIAGNOSIS — B965 Pseudomonas (aeruginosa) (mallei) (pseudomallei) as the cause of diseases classified elsewhere: Secondary | ICD-10-CM | POA: Diagnosis present

## 2020-05-06 DIAGNOSIS — R748 Abnormal levels of other serum enzymes: Secondary | ICD-10-CM | POA: Diagnosis present

## 2020-05-06 DIAGNOSIS — R188 Other ascites: Secondary | ICD-10-CM | POA: Diagnosis not present

## 2020-05-06 DIAGNOSIS — R509 Fever, unspecified: Secondary | ICD-10-CM | POA: Diagnosis not present

## 2020-05-06 DIAGNOSIS — D649 Anemia, unspecified: Secondary | ICD-10-CM | POA: Diagnosis present

## 2020-05-06 DIAGNOSIS — R5381 Other malaise: Secondary | ICD-10-CM | POA: Diagnosis present

## 2020-05-06 DIAGNOSIS — K750 Abscess of liver: Secondary | ICD-10-CM | POA: Diagnosis present

## 2020-05-06 DIAGNOSIS — F0151 Vascular dementia with behavioral disturbance: Secondary | ICD-10-CM | POA: Diagnosis not present

## 2020-05-06 DIAGNOSIS — M1990 Unspecified osteoarthritis, unspecified site: Secondary | ICD-10-CM | POA: Diagnosis present

## 2020-05-06 DIAGNOSIS — Z1639 Resistance to other specified antimicrobial drug: Secondary | ICD-10-CM | POA: Diagnosis present

## 2020-05-06 DIAGNOSIS — R7989 Other specified abnormal findings of blood chemistry: Secondary | ICD-10-CM | POA: Diagnosis present

## 2020-05-06 DIAGNOSIS — N179 Acute kidney failure, unspecified: Secondary | ICD-10-CM | POA: Diagnosis present

## 2020-05-06 DIAGNOSIS — R1084 Generalized abdominal pain: Secondary | ICD-10-CM | POA: Diagnosis present

## 2020-05-06 DIAGNOSIS — D696 Thrombocytopenia, unspecified: Secondary | ICD-10-CM | POA: Diagnosis present

## 2020-05-06 DIAGNOSIS — F0150 Vascular dementia without behavioral disturbance: Secondary | ICD-10-CM | POA: Diagnosis present

## 2020-05-06 DIAGNOSIS — F0390 Unspecified dementia without behavioral disturbance: Secondary | ICD-10-CM | POA: Diagnosis present

## 2020-05-06 DIAGNOSIS — Z20822 Contact with and (suspected) exposure to covid-19: Secondary | ICD-10-CM | POA: Diagnosis present

## 2020-05-06 DIAGNOSIS — R419 Unspecified symptoms and signs involving cognitive functions and awareness: Secondary | ICD-10-CM | POA: Diagnosis present

## 2020-05-06 DIAGNOSIS — Z888 Allergy status to other drugs, medicaments and biological substances status: Secondary | ICD-10-CM

## 2020-05-06 DIAGNOSIS — Z9049 Acquired absence of other specified parts of digestive tract: Secondary | ICD-10-CM

## 2020-05-06 LAB — CBC WITH DIFFERENTIAL/PLATELET
Abs Immature Granulocytes: 0.03 10*3/uL (ref 0.00–0.07)
Basophils Absolute: 0 10*3/uL (ref 0.0–0.1)
Basophils Relative: 0 %
Eosinophils Absolute: 0.1 10*3/uL (ref 0.0–0.5)
Eosinophils Relative: 1 %
HCT: 35.3 % — ABNORMAL LOW (ref 36.0–46.0)
Hemoglobin: 11.3 g/dL — ABNORMAL LOW (ref 12.0–15.0)
Immature Granulocytes: 0 %
Lymphocytes Relative: 7 %
Lymphs Abs: 0.5 10*3/uL — ABNORMAL LOW (ref 0.7–4.0)
MCH: 29.8 pg (ref 26.0–34.0)
MCHC: 32 g/dL (ref 30.0–36.0)
MCV: 93.1 fL (ref 80.0–100.0)
Monocytes Absolute: 1 10*3/uL (ref 0.1–1.0)
Monocytes Relative: 14 %
Neutro Abs: 5.3 10*3/uL (ref 1.7–7.7)
Neutrophils Relative %: 78 %
Platelets: 117 10*3/uL — ABNORMAL LOW (ref 150–400)
RBC: 3.79 MIL/uL — ABNORMAL LOW (ref 3.87–5.11)
RDW: 15.2 % (ref 11.5–15.5)
WBC: 6.9 10*3/uL (ref 4.0–10.5)
nRBC: 0 % (ref 0.0–0.2)

## 2020-05-06 LAB — URINALYSIS, ROUTINE W REFLEX MICROSCOPIC
Bacteria, UA: NONE SEEN
Bilirubin Urine: NEGATIVE
Glucose, UA: NEGATIVE mg/dL
Hgb urine dipstick: NEGATIVE
Ketones, ur: 5 mg/dL — AB
Leukocytes,Ua: NEGATIVE
Nitrite: NEGATIVE
Protein, ur: 30 mg/dL — AB
Specific Gravity, Urine: 1.023 (ref 1.005–1.030)
pH: 5 (ref 5.0–8.0)

## 2020-05-06 LAB — COMPREHENSIVE METABOLIC PANEL
ALT: 108 U/L — ABNORMAL HIGH (ref 0–44)
AST: 66 U/L — ABNORMAL HIGH (ref 15–41)
Albumin: 2.7 g/dL — ABNORMAL LOW (ref 3.5–5.0)
Alkaline Phosphatase: 183 U/L — ABNORMAL HIGH (ref 38–126)
Anion gap: 10 (ref 5–15)
BUN: 17 mg/dL (ref 8–23)
CO2: 23 mmol/L (ref 22–32)
Calcium: 8.3 mg/dL — ABNORMAL LOW (ref 8.9–10.3)
Chloride: 103 mmol/L (ref 98–111)
Creatinine, Ser: 1.12 mg/dL — ABNORMAL HIGH (ref 0.44–1.00)
GFR, Estimated: 48 mL/min — ABNORMAL LOW (ref >60)
Glucose, Bld: 131 mg/dL — ABNORMAL HIGH (ref 70–99)
Potassium: 3.6 mmol/L (ref 3.5–5.1)
Sodium: 136 mmol/L (ref 135–145)
Total Bilirubin: 0.5 mg/dL (ref 0.3–1.2)
Total Protein: 6.2 g/dL — ABNORMAL LOW (ref 6.5–8.1)

## 2020-05-06 LAB — VALPROIC ACID LEVEL: Valproic Acid Lvl: 28 ug/mL — ABNORMAL LOW (ref 50.0–100.0)

## 2020-05-06 LAB — PROTIME-INR
INR: 1.2 (ref 0.8–1.2)
Prothrombin Time: 14.5 seconds (ref 11.4–15.2)

## 2020-05-06 LAB — RESP PANEL BY RT-PCR (FLU A&B, COVID) ARPGX2
Influenza A by PCR: NEGATIVE
Influenza B by PCR: NEGATIVE
SARS Coronavirus 2 by RT PCR: NEGATIVE

## 2020-05-06 LAB — LACTIC ACID, PLASMA: Lactic Acid, Venous: 1.5 mmol/L (ref 0.5–1.9)

## 2020-05-06 LAB — APTT: aPTT: 36 seconds (ref 24–36)

## 2020-05-06 MED ORDER — LACTATED RINGERS IV BOLUS (SEPSIS)
1000.0000 mL | Freq: Once | INTRAVENOUS | Status: AC
  Administered 2020-05-06: 20:00:00 1000 mL via INTRAVENOUS

## 2020-05-06 MED ORDER — IOHEXOL 300 MG/ML  SOLN
100.0000 mL | Freq: Once | INTRAMUSCULAR | Status: AC | PRN
  Administered 2020-05-06: 22:00:00 100 mL via INTRAVENOUS

## 2020-05-06 MED ORDER — SODIUM CHLORIDE 0.9 % IV BOLUS
1000.0000 mL | Freq: Once | INTRAVENOUS | Status: AC
  Administered 2020-05-06: 23:00:00 1000 mL via INTRAVENOUS

## 2020-05-06 MED ORDER — PIPERACILLIN-TAZOBACTAM 3.375 G IVPB 30 MIN
3.3750 g | Freq: Once | INTRAVENOUS | Status: AC
  Administered 2020-05-06: 3.375 g via INTRAVENOUS
  Filled 2020-05-06: qty 50

## 2020-05-06 NOTE — ED Triage Notes (Addendum)
Per EMS, patient from [facility], seen at [facility] x4 days ago and dx with UTI. Staff reports patient has not improved since that time. Facility reports fevers daily. Hx dementia. Baseline per staff. Unsteady gait.   20g L FA NS 650mg  Tylenol at 1700.

## 2020-05-06 NOTE — Telephone Encounter (Signed)
Facility Nurse called on call provider states patient's Temperature 103.6 ,Pulse 88,Resp 26,B/p 146/64 and oxygen saturations 92 patient not responding to Nurse states has had ongoing fever since 05/02/2020 when she fell and was treated for UTI.Advised to send patient to ED for further evaluation.

## 2020-05-06 NOTE — ED Provider Notes (Signed)
[facility] DEPT Provider Note   CSN: [id number] Arrival date & time: 05/06/20  1817     History Chief Complaint  Patient presents with  . Urinary Tract Infection    Sarah Larsen is a 86 y.o. female.  HPI Level 5 caveat secondary to dementia 86 year old female presents today via EMS from [place] [facility].  They report that patient has had a UTI and has had increasing weakness.  Today temperature was 2.3.  Review of records revealed that patient has been on nitrofurantoin since 12 7 for presumed urinary tract infection.  She was seen in the emergency department on 12 9.  At that time she received a liter of normal saline and 1 g of Rocephin.  Urine was obtained and cultured and showed less than 10,000 E. coli.  She has been continued on nitrofurantoin at the nursing home.  Review of notes from there revealed that she has continued to decline.  Review of records from facility with nurse practitioner note that states temperature to 103.6, pulse 88, respiratory rate 26, blood pressure 146/64 oxygen saturations 92% patient not responding to nurse states she had ongoing fever since 05/02/2020 when she fell was treated for UTI advised to send patient to ED for further evaluation Patient does not have any significant complaints here.  She is not entirely clear why she is here.  She denies knowing why she was sent to the emergency department.    Past Medical History:  Diagnosis Date  . Arthritis   . Dementia ([place])   . Hx of concussion    [place]  . Hyperlipidemia   . Vitamin D deficiency, unspecified     Patient Active Problem List   Diagnosis Date Noted  . Acute renal injury ([place]) 05/03/2020  . Generalized weakness 05/03/2020  . UTI (urinary tract infection) 05/03/2020  . Elevated TSH 03/28/2020  . Weight gain 03/22/2020  . Hyponatremia 01/18/2020  . Depression with anxiety 01/05/2020  . Fall 12/28/2019  . Dry eyes  11/09/2019  . Dizziness 08/24/2019  . Agitation 08/24/2019  . Vascular dementia ([place]) 01/15/2019  . Hyperlipidemia 01/15/2019  . Vitamin D insufficiency 01/15/2019  . Unsteady gait 01/15/2019    Past Surgical History:  Procedure Laterality Date  . CHOLECYSTECTOMY       OB History   No obstetric history on file.     No family history on file.  Social History   Vaping Use  . Vaping Use: Never used  Substance Use Topics  . Alcohol use: Never  . Drug use: Never    Home Medications Prior to Admission medications   Medication Sig Start Date End Date Taking? Authorizing Provider  acetaminophen (TYLENOL) 500 MG tablet Take 1,000 mg by mouth every 8 (eight) hours as needed.    [provider]  CALCIUM-VITAMIN D PO Take 1 tablet by mouth in the morning and at bedtime. 674m (1,5066m - 400 unit    [provider]  clonazePAM (KLONOPIN) 0.5 MG tablet Take 0.5 mg by mouth at bedtime.    [provider]  divalproex (DEPAKOTE SPRINKLE) 125 MG capsule Take 250 mg by mouth 3 (three) times daily.    [provider]  donepezil (ARICEPT) 10 MG tablet Take 10 mg by mouth at bedtime.     [provider]  memantine (NAMENDA) 10 MG tablet Take 10 mg by mouth 2 (two) times daily.    [provider]  nitrofurantoin, macrocrystal-monohydrate, (MACROBID) 100 MG  capsule Take 100 mg by mouth 2 (two) times daily. 05/02/20   [provider]  POLYVINYL ALCOHOL-POVIDONE OP Place 2 drops into both eyes 3 (three) times daily. drops; 0.5-0.6 %; amt: 2 drops each eye; ophthalmic (eye)    [provider]  QUEtiapine (SEROQUEL) 25 MG tablet Take 12.5 mg by mouth daily.     [provider]  QUEtiapine (SEROQUEL) 25 MG tablet Take 25 mg by mouth at bedtime.    [provider]  saccharomyces boulardii (FLORASTOR) 250 MG capsule Take 250 mg by mouth 2 (two) times daily. Pt is taking while on antibiotic    [provider]  triamcinolone cream (KENALOG) 0.1 % Apply 1 application topically. Apply to rash on ankles twice a day until healed.    [provider]    Allergies    Fish-derived products and Pravachol [pravastatin sodium]  Review of Systems   Review of Systems  Unable to perform ROS: Dementia    Physical Exam Updated Vital Signs BP (!) 113/56   Pulse 83   Resp 20   SpO2 93%   Physical Exam Vitals reviewed.  Constitutional:      General: She is not in acute distress.    Appearance: Normal appearance.  HENT:     Head: Normocephalic.     Right Ear: External ear normal.     Left Ear: External ear normal.     Nose: Nose normal.     Mouth/Throat:     Comments: Mucous membranes appear dry Eyes:     Extraocular Movements: Extraocular movements intact.     Pupils: Pupils are equal, round, and reactive to light.     Comments: Conjunctive a pale  Cardiovascular:     Rate and Rhythm: Normal rate and regular rhythm.     Pulses: Normal pulses.     Heart sounds: Normal heart sounds.  Pulmonary:     Effort: Pulmonary effort is normal.     Breath sounds: Normal breath sounds.  Abdominal:     General: Abdomen is flat. There is distension.     Palpations: Abdomen is soft.     Tenderness: There is abdominal tenderness.     Comments: Suprapubic distention noted with suprapubic midline circular wound-question suprapubic catheter site  Musculoskeletal:     Cervical back: Normal range of motion and neck supple.  Skin:    General: Skin is warm and dry.     Capillary Refill: Capillary refill takes less than 2 seconds.     Coloration: Skin is pale.  Neurological:     General: No focal deficit present.     Mental Status: She is alert.     Cranial Nerves: No cranial nerve deficit.     Sensory: No sensory deficit.     Motor: No weakness.     Coordination: Coordination normal.     Deep Tendon Reflexes: Reflexes normal.     Comments: Patient is oriented to person and to hospital but not  to date  Psychiatric:        Mood and Affect: Mood normal.     ED Results / Procedures / Treatments   Labs (all labs ordered are listed, but only abnormal results are displayed) Labs Reviewed - No data to display  EKG EKG Interpretation  Date/Time:  Sunday May 06 2020 19:22:54 EST Ventricular Rate:  78 PR Interval:    QRS Duration: 87 QT Interval:  364 QTC Calculation: 415 R Axis:   27 Text Interpretation:  Sinus rhythm Low voltage, precordial leads Confirmed by Pattricia Boss [phone]) on 05/06/2020 10:13:28 PM   Radiology DG Chest Port 1 View  Result Date: 05/06/2020 CLINICAL DATA:  Questionable sepsis EXAM: PORTABLE CHEST 1 VIEW COMPARISON:  None. FINDINGS: Heart is normal size. Bibasilar atelectasis. No effusions. No acute bony abnormality. IMPRESSION: Bibasilar atelectasis. Electronically Signed   By: Rolm Baptise M.D.   On: 05/06/2020 19:41    Procedures .Critical Care Performed by: Pattricia Boss, MD Authorized by: Pattricia Boss, MD   Critical care provider statement:    Critical care time (minutes):  45   Critical care end time:  05/06/2020 11:10 PM   Critical care was time spent personally by me on the following activities:  Discussions with consultants, evaluation of patient's response to treatment, examination of patient, ordering and performing treatments and interventions, ordering and review of laboratory studies, ordering and review of radiographic studies, pulse oximetry, re-evaluation of patient's condition, obtaining history from patient or surrogate and review of old charts   (including critical care time)  Medications Ordered in ED Medications - No data to display  ED Course  I have reviewed the triage vital signs and the nursing notes.  Pertinent labs & imaging results that were available during my care of the patient were reviewed by me and considered in my medical decision making (see chart for details).   None MDM Rules/Calculators/A&P                           86 yo female with dementia sent from snf with fever after being treated for uti for a week. Patient seen in the ED on December 9 and had urine culture obtained at that time grew less than 10,000 E. coli. However, she had been on nitrofurantoin for several days before that. She has continued to weaker during this time. On my exam, she has some diffuse abdominal pain. Labs with elevated transaminases and alk phos.  No leukocytosis noted.  Blood cultures sent. Lactic acid normal. She has received tylenol and 500 cc ns prehospital.  Here she was given an additional liter ns Patient is DNR CT with large fluid collection above the dome of the liver of unclear etiology. Given patient's recent fevers, will cover with Zosyn. Plan admission for further evaluation and treatment Discussed with Dr. Hal Hope and will see for admission Final Clinical Impression(s) / ED Diagnoses Final diagnoses:  Generalized abdominal pain  Fever, unspecified fever cause    Rx / DC Orders ED Discharge Orders    None       Pattricia Boss, MD 05/06/20 2310

## 2020-05-07 ENCOUNTER — Inpatient Hospital Stay (HOSPITAL_COMMUNITY): Payer: Medicare Other

## 2020-05-07 ENCOUNTER — Encounter (HOSPITAL_COMMUNITY): Payer: Self-pay | Admitting: Internal Medicine

## 2020-05-07 DIAGNOSIS — R188 Other ascites: Secondary | ICD-10-CM | POA: Diagnosis present

## 2020-05-07 DIAGNOSIS — F0151 Vascular dementia with behavioral disturbance: Secondary | ICD-10-CM

## 2020-05-07 DIAGNOSIS — R509 Fever, unspecified: Secondary | ICD-10-CM

## 2020-05-07 LAB — GLUCOSE, CAPILLARY
Glucose-Capillary: 100 mg/dL — ABNORMAL HIGH (ref 70–99)
Glucose-Capillary: 92 mg/dL (ref 70–99)

## 2020-05-07 LAB — URINE CULTURE: Culture: NO GROWTH

## 2020-05-07 LAB — CBG MONITORING, ED: Glucose-Capillary: 101 mg/dL — ABNORMAL HIGH (ref 70–99)

## 2020-05-07 MED ORDER — DEXTROSE-NACL 5-0.9 % IV SOLN: INTRAVENOUS | Status: AC

## 2020-05-07 MED ORDER — MIDAZOLAM HCL 2 MG/2ML IJ SOLN
INTRAMUSCULAR | Status: AC
  Filled 2020-05-07: qty 2

## 2020-05-07 MED ORDER — DIVALPROEX SODIUM 125 MG PO CSDR
250.0000 mg | DELAYED_RELEASE_CAPSULE | Freq: Three times a day (TID) | ORAL | Status: DC
  Administered 2020-05-07 – 2020-05-14 (×21): 250 mg via ORAL
  Filled 2020-05-07 (×23): qty 2

## 2020-05-07 MED ORDER — ACETAMINOPHEN 325 MG PO TABS
650.0000 mg | ORAL_TABLET | Freq: Four times a day (QID) | ORAL | Status: DC | PRN
  Administered 2020-05-08: 650 mg via ORAL
  Filled 2020-05-07: qty 2

## 2020-05-07 MED ORDER — MEMANTINE HCL 10 MG PO TABS
10.0000 mg | ORAL_TABLET | Freq: Two times a day (BID) | ORAL | Status: DC
  Administered 2020-05-07 – 2020-05-14 (×14): 10 mg via ORAL
  Filled 2020-05-07 (×14): qty 1

## 2020-05-07 MED ORDER — CLONAZEPAM 0.5 MG PO TABS
0.5000 mg | ORAL_TABLET | Freq: Every day | ORAL | Status: DC
  Administered 2020-05-07 – 2020-05-13 (×8): 0.5 mg via ORAL
  Filled 2020-05-07 (×8): qty 1

## 2020-05-07 MED ORDER — FENTANYL CITRATE (PF) 100 MCG/2ML IJ SOLN
INTRAMUSCULAR | Status: AC | PRN
  Administered 2020-05-07: 25 ug via INTRAVENOUS

## 2020-05-07 MED ORDER — QUETIAPINE FUMARATE 25 MG PO TABS
25.0000 mg | ORAL_TABLET | Freq: Every day | ORAL | Status: DC
  Administered 2020-05-07 – 2020-05-13 (×7): 25 mg via ORAL
  Filled 2020-05-07 (×7): qty 1

## 2020-05-07 MED ORDER — SODIUM CHLORIDE 0.9% FLUSH
5.0000 mL | Freq: Three times a day (TID) | INTRAVENOUS | Status: DC
  Administered 2020-05-07 – 2020-05-12 (×12): 5 mL

## 2020-05-07 MED ORDER — SODIUM CHLORIDE 0.9 % IV SOLN
INTRAVENOUS | Status: DC | PRN
  Administered 2020-05-07 – 2020-05-08 (×2): 250 mL via INTRAVENOUS
  Administered 2020-05-10 – 2020-05-11 (×4): 500 mL via INTRAVENOUS

## 2020-05-07 MED ORDER — ACETAMINOPHEN 650 MG RE SUPP: 650.0000 mg | Freq: Four times a day (QID) | RECTAL | Status: DC | PRN

## 2020-05-07 MED ORDER — FENTANYL CITRATE (PF) 100 MCG/2ML IJ SOLN
INTRAMUSCULAR | Status: AC
  Filled 2020-05-07: qty 2

## 2020-05-07 MED ORDER — MIDAZOLAM HCL 2 MG/2ML IJ SOLN
INTRAMUSCULAR | Status: AC | PRN
  Administered 2020-05-07: 0.5 mg via INTRAVENOUS

## 2020-05-07 MED ORDER — QUETIAPINE FUMARATE 25 MG PO TABS
12.5000 mg | ORAL_TABLET | Freq: Every day | ORAL | Status: DC
  Administered 2020-05-08 – 2020-05-14 (×7): 12.5 mg via ORAL
  Filled 2020-05-07 (×7): qty 1

## 2020-05-07 MED ORDER — PIPERACILLIN-TAZOBACTAM 3.375 G IVPB
3.3750 g | Freq: Three times a day (TID) | INTRAVENOUS | Status: DC
  Administered 2020-05-07 – 2020-05-14 (×22): 3.375 g via INTRAVENOUS
  Filled 2020-05-07 (×22): qty 50

## 2020-05-07 MED ORDER — LIDOCAINE HCL 1 % IJ SOLN
INTRAMUSCULAR | Status: AC
  Filled 2020-05-07: qty 20

## 2020-05-07 NOTE — H&P (Signed)
History and Physical    Sarah Larsen GNF:[id number] DOB: [birth year] DOA: 05/06/2020  PCP: [name], MD  Patient coming from: [facility].  Chief Complaint: Persistent fever.  HPI: Sarah Larsen is a 86 y.o. female with history of dementia was brought to the ER 4 days ago for fever at that time was diagnosed with urinary tract infection and was discharged on antibiotic despite which patient was still having persistent fever and chills and also complained of some abdominal discomfort.  No nausea vomiting or diarrhea as per the report.  ED Course: In the ER patient had a fever of 100.5 with labs showing elevated LFTs and slight tenderness in the right upper quadrant patient underwent a CT abdomen pelvis which showed 10.7 cm fluid collection around the dome of the liver which could be subcapsular.  Blood cultures were drawn and started on empiric antibiotics admitted for further management of fluid collection around the dome of the liver which could be possible abscess.  I also discussed with patient's healthcare power of attorney Ms. Mariella Saa since patient has dementia.  Ms. Loura Back states that patient has had previous history of abscess which required drain about 10 years ago when she was in [place].  Review of Systems: As per HPI, rest all negative.   Past Medical History:  Diagnosis Date   Arthritis    Dementia (HCC)    Hx of concussion    [facility] [place]   Hyperlipidemia    Vitamin D deficiency, unspecified     Past Surgical History:  Procedure Laterality Date   CHOLECYSTECTOMY       reports that she has never smoked. She has never used smokeless tobacco. She reports that she does not drink alcohol and does not use drugs.  Allergies  Allergen Reactions   Fish-Derived Products    Pravachol [Pravastatin Sodium]     Family History  Family history unknown: Yes    Prior to Admission medications   Medication Sig Start Date End  Date Taking? Authorizing Provider  acetaminophen (TYLENOL) 500 MG tablet Take 1,000 mg by mouth every 8 (eight) hours as needed.    [provider]  CALCIUM-VITAMIN D PO Take 1 tablet by mouth in the morning and at bedtime. 600mg  (1,500mg ) - 400 unit    [provider]  clonazePAM (KLONOPIN) 0.5 MG tablet Take 0.5 mg by mouth at bedtime.    [provider]  divalproex (DEPAKOTE SPRINKLE) 125 MG capsule Take 250 mg by mouth 3 (three) times daily.    [provider]  donepezil (ARICEPT) 10 MG tablet Take 10 mg by mouth at bedtime.     [provider]  memantine (NAMENDA) 10 MG tablet Take 10 mg by mouth 2 (two) times daily.    [provider]  nitrofurantoin, macrocrystal-monohydrate, (MACROBID) 100 MG capsule Take 100 mg by mouth 2 (two) times daily. 05/02/20   [provider]  POLYVINYL ALCOHOL-POVIDONE OP Place 2 drops into both eyes 3 (three) times daily. drops; 0.5-0.6 %; amt: 2 drops each eye; ophthalmic (eye)    [provider]  QUEtiapine (SEROQUEL) 25 MG tablet Take 12.5 mg by mouth daily.     [provider]  QUEtiapine (SEROQUEL) 25 MG tablet Take 25 mg by mouth at bedtime.    [provider]  saccharomyces boulardii (FLORASTOR) 250 MG capsule Take 250 mg by mouth 2 (two) times daily. Pt is taking while on antibiotic    [provider]  triamcinolone cream (KENALOG) 0.1 % Apply 1 application topically. Apply to rash on ankles twice a day until healed.    [provider]    Physical Exam: Constitutional: Moderately built and nourished. Vitals:   05/06/20 2230 05/06/20 2300 05/06/20 2330 05/07/20 0000  BP: 109/61 (!) 116/59 125/73 (!) 113/92  Pulse: 77 78 80 78  Resp: 20 20 20 18   Temp:      TempSrc:      SpO2: 94% 98% 98% 97%   Eyes: Anicteric no pallor. ENMT: No discharge from the ears eyes nose or mouth. Neck: No mass felt.  No neck rigidity. Respiratory: No rhonchi or  crepitations. Cardiovascular: S1-S2 heard. Abdomen: Soft mild right upper quadrant tenderness no guarding or rigidity. Musculoskeletal: No edema. Skin: No rash. Neurologic: Alert awake oriented to name and place moves all extremities. Psychiatric: Oriented to name and place.   Labs on Admission: I have personally reviewed following labs and imaging studies  CBC: Recent Labs  Lab 05/02/20 2225 05/06/20 1937  WBC 7.3 6.9  NEUTROABS 6.5 5.3  HGB 11.4* 11.3*  HCT 36.1 35.3*  MCV 93.0 93.1  PLT 132* 117*   Basic Metabolic Panel: Recent Labs  Lab 05/02/20 2225 05/06/20 1937  NA 134* 136  K 3.6 3.6  CL 98 103  CO2 28 23  GLUCOSE 118* 131*  BUN 27* 17  CREATININE 1.66* 1.12*  CALCIUM 9.4 8.3*   GFR: Estimated Creatinine Clearance: 37.6 mL/min (A) (by C-G formula based on SCr of 1.12 mg/dL (H)). Liver Function Tests: Recent Labs  Lab 05/06/20 1937  AST 66*  ALT 108*  ALKPHOS 183*  BILITOT 0.5  PROT 6.2*  ALBUMIN 2.7*   No results for input(s): LIPASE, AMYLASE in the last 168 hours. No results for input(s): AMMONIA in the last 168 hours. Coagulation Profile: Recent Labs  Lab 05/06/20 1937  INR 1.2   Cardiac Enzymes: No results for input(s): CKTOTAL, CKMB, CKMBINDEX, TROPONINI in the last 168 hours. BNP (last 3 results) No results for input(s): PROBNP in the last 8760 hours. HbA1C: No results for input(s): HGBA1C in the last 72 hours. CBG: No results for input(s): GLUCAP in the last 168 hours. Lipid Profile: No results for input(s): CHOL, HDL, LDLCALC, TRIG, CHOLHDL, LDLDIRECT in the last 72 hours. Thyroid Function Tests: No results for input(s): TSH, T4TOTAL, FREET4, T3FREE, THYROIDAB in the last 72 hours. Anemia Panel: No results for input(s): VITAMINB12, FOLATE, FERRITIN, TIBC, IRON, RETICCTPCT in the last 72 hours. Urine analysis:    Component Value Date/Time   COLORURINE YELLOW 05/06/2020 1937   APPEARANCEUR HAZY (A) 05/06/2020 1937   LABSPEC  1.023 05/06/2020 1937   PHURINE 5.0 05/06/2020 1937   GLUCOSEU NEGATIVE 05/06/2020 1937   HGBUR NEGATIVE 05/06/2020 1937   BILIRUBINUR NEGATIVE 05/06/2020 1937   KETONESUR 5 (A) 05/06/2020 1937   PROTEINUR 30 (A) 05/06/2020 1937   NITRITE NEGATIVE 05/06/2020 1937   LEUKOCYTESUR NEGATIVE 05/06/2020 1937   Sepsis Labs: @LABRCNTIP (procalcitonin:4,lacticidven:4) ) Recent Results (from the past 240 hour(s))  Blood culture (routine x 2)     Status: None (Preliminary result)   Collection Time: 05/03/20  2:00 AM   Specimen: BLOOD  Result Value Ref Range Status   Specimen Description BLOOD RIGHT ANTECUBITAL  Final   Special Requests   Final    BOTTLES DRAWN AEROBIC AND ANAEROBIC Blood Culture adequate volume   Culture   Final    NO GROWTH 3 DAYS Performed at [facility], [address], [place], [place] [address]    Report Status PENDING  Incomplete  Blood culture (routine x 2)     Status: None (Preliminary result)   Collection Time: 05/03/20  3:00 AM   Specimen: BLOOD RIGHT HAND  Result Value Ref Range Status   Specimen Description BLOOD RIGHT HAND  Final   Special Requests   Final    BOTTLES DRAWN AEROBIC AND ANAEROBIC Blood Culture results may not be optimal due to an inadequate volume of blood received in culture bottles   Culture   Final    NO GROWTH 3 DAYS Performed at [facility], [address], [place], [place] [address]    Report Status PENDING  Incomplete  Urine culture     Status: Abnormal   Collection Time: 05/03/20  3:26 AM   Specimen: Urine, Random  Result Value Ref Range Status   Specimen Description URINE, RANDOM  Final   Special Requests NONE  Final   Culture (A)  Final    <10,000 COLONIES/mL INSIGNIFICANT GROWTH Performed at [facility], [address], [place], [place] [address]    Report Status 05/04/2020 FINAL  Final  Resp Panel by RT-PCR (Flu A&B, Covid) Nasopharyngeal Swab     Status: None   Collection Time: 05/06/20  7:37 PM    Specimen: Nasopharyngeal Swab; Nasopharyngeal(NP) swabs in vial transport medium  Result Value Ref Range Status   SARS Coronavirus 2 by RT PCR NEGATIVE NEGATIVE Final    Comment: (NOTE) SARS-CoV-2 target nucleic acids are NOT DETECTED.  The SARS-CoV-2 RNA is generally detectable in upper respiratory specimens during the acute phase of infection. The lowest concentration of SARS-CoV-2 viral copies this assay can detect is 138 copies/mL. A negative result does not preclude SARS-Cov-2 infection and should not be used as the sole basis for treatment or other patient management decisions. A negative result may occur with  improper specimen collection/handling, submission of specimen other than nasopharyngeal swab, presence of viral mutation(s) within the areas targeted by this assay, and inadequate number of viral copies(<138 copies/mL). A negative result must be combined with clinical observations, patient history, and epidemiological information. The expected result is Negative.  Fact Sheet for Patients:  BloggerCourse.com  Fact Sheet for Healthcare Providers:  SeriousBroker.it  This test is no t yet approved or cleared by the [place] FDA and  has been authorized for detection and/or diagnosis of SARS-CoV-2 by FDA under an Emergency Use Authorization (EUA). This EUA will remain  in effect (meaning this test can be used) for the duration of the COVID-19 declaration under Section 564(b)(1) of the Act, 21 U.S.C.section 360bbb-3(b)(1), unless the authorization is terminated  or revoked sooner.       Influenza A by PCR NEGATIVE NEGATIVE Final   Influenza B by PCR NEGATIVE NEGATIVE Final    Comment: (NOTE) The Xpert Xpress SARS-CoV-2/FLU/RSV plus assay is intended as an aid in the diagnosis of influenza from Nasopharyngeal swab specimens and should not be used as a sole basis for treatment. Nasal washings and aspirates are  unacceptable for Xpert Xpress SARS-CoV-2/FLU/RSV testing.  Fact Sheet for Patients: BloggerCourse.com  Fact Sheet for Healthcare Providers: SeriousBroker.it  This test is not yet approved or cleared by the [place] FDA and has been authorized for detection and/or diagnosis of SARS-CoV-2 by FDA under an Emergency Use Authorization (EUA). This EUA will remain in effect (meaning this test can be used) for the duration of the COVID-19 declaration under Section 564(b)(1) of  the Act, 21 U.S.C. section 360bbb-3(b)(1), unless the authorization is terminated or revoked.  Performed at [facility], [address] [place], [place] [address]      Radiological Exams on Admission: DG Chest Port 1 View  Result Date: 05/06/2020 CLINICAL DATA:  Questionable sepsis EXAM: PORTABLE CHEST 1 VIEW COMPARISON:  None. FINDINGS: Heart is normal size. Bibasilar atelectasis. No effusions. No acute bony abnormality. IMPRESSION: Bibasilar atelectasis. Electronically Signed   By: Charlett NoseKevin  Dover M.D.   On: 05/06/2020 19:41    EKG: Independently reviewed.  Normal sinus rhythm.  Assessment/Plan Active Problems:   Vascular dementia (HCC)   Hyperlipidemia   Fever   Intraabdominal fluid collection    1. Fever with fluid collection around dome of the liver concerning for possible abscess for which I have discussed with patient's healthcare power of attorney Ms. Franchot GalloJeffry's.  Patient's healthcare power of attorney has consented for interventional radiology consult for possible aspiration and further studies.  For now we will keep patient n.p.o. except medications and antibiotics.  Follow cultures and follow intervention radiology recommendations. 2. Elevated LFTs likely from fluid collection around the liver.  Will await IR consult.  Follow cultures. 3. History of dementia with history of behavioral disturbances on Depakote Klonopin Namenda and  Seroquel. 4. Acute renal failure likely from dehydration gently hydrate and follow metabolic panel. 5. Anemia appears to be chronic follow CBC.  Given that patient likely may have intra-abdominal abscess will need close monitoring for any further worsening in inpatient status.   DVT prophylaxis: SCDs.  Avoiding anticoagulation in anticipation of procedure. Code Status: DNR confirmed with patient's healthcare power of attorney. Family Communication: Discussed with patient's cousin who is a healthcare power of attorney Ms. Charolette ForwardJeffry. Disposition Plan: Back to facility when stable. Consults called: Interventional radiology. Admission status: Inpatient.   Eduard ClosArshad N Celine Dishman MD [facility] Pager [phone].  If 7PM-7AM, please contact night-coverage [email or web address] Password [email or web address]  05/07/2020, 12:11 AM

## 2020-05-07 NOTE — Procedures (Signed)
Interventional Radiology Procedure Note  Procedure: CT Guided Drainage of suprahepatic abscess  Complications: None  Estimated Blood Loss: < 10 mL  Findings: Initial aspiration of suprahepatic fluid collection yielded thick, purulent fluid. 10 Fr drain placed in suprahepatic/infradiaphragmatic abscess. Fluid sample sent for culture analysis. Drain attached to suction bulb drainage.  Will follow.  Jodi Marble. Fredia Sorrow, M.D Pager:  [phone]

## 2020-05-07 NOTE — Progress Notes (Signed)
Pharmacy Antibiotic Note  Sarah Larsen is a 86 y.o. female admitted on 05/06/2020 with Intra-abd infection.  Pharmacy has been consulted for Zosyn dosing.  Plan: Zosyn 3.375g IV q8h (4 hour infusion).    Temp (24hrs), Avg:100.5 F (38.1 C), Min:100.5 F (38.1 C), Max:100.5 F (38.1 C)  Recent Labs  Lab 05/02/20 2225 05/03/20 0209 05/06/20 1937  WBC 7.3  --  6.9  CREATININE 1.66*  --  1.12*  LATICACIDVEN  --  1.3 1.5    Estimated Creatinine Clearance: 37.6 mL/min (A) (by C-G formula based on SCr of 1.12 mg/dL (H)).    Allergies  Allergen Reactions  . Fish-Derived Products   . Pravachol [Pravastatin Sodium]     Antimicrobials this admission: 12/12 Zosyn >>   Dose adjustments this admission:  Microbiology results: 12/12 BCx x1: sent 12/12 UCx: semt   Thank you for allowing pharmacy to be a part of this patient's care.  Otho Bellows PharmD 05/07/2020 12:14 AM

## 2020-05-07 NOTE — Consult Note (Signed)
Chief Complaint: Patient was seen in consultation today for image guided aspiration/possible drainage of upper abdominal fluid collection Chief Complaint  Patient presents with  . Urinary Tract Infection    Referring Physician(s): Arvilla Market  Supervising Physician: Irish Lack  Patient Status: [facility] - ED  History of Present Illness: Sarah Larsen is an 86 y.o. female with past medical history of arthritis, dementia, hyperlipidemia, vitamin D deficiency who was seen in [place], ED on 12/9 with fever, diagnosed with urinary tract infection discharged on antibiotics. She now presents again to [facility] long ED with persistent fever, chills, abdominal discomfort and imaging revealing a large fluid collection along the dome of the liver likely subcapsular fluid of unknown etiology, diverticulosis, small bilateral pleural effusions, bilateral inguinal hernias containing fat. She is COVID-19 negative. Blood cultures pending. WBC normal, hemoglobin 11.3, platelets 117k, PT/INR normal. Request now received from [facility] for image guided aspiration/possible drainage of abdominal fluid collection.  Past Medical History:  Diagnosis Date  . Arthritis   . Dementia (HCC)   . Hx of concussion    [facility]  . Hyperlipidemia   . Vitamin D deficiency, unspecified     Past Surgical History:  Procedure Laterality Date  . CHOLECYSTECTOMY      Allergies: Fish-derived products and Pravachol [pravastatin sodium]  Medications: Prior to Admission medications   Medication Sig Start Date End Date Taking? Authorizing Provider  CALCIUM-VITAMIN D PO Take 1 tablet by mouth in the morning and at bedtime. 600mg  (1,500mg ) - 400 unit   Yes [provider]  clonazePAM (KLONOPIN) 0.5 MG tablet Take 0.5 mg by mouth at bedtime.   Yes [provider]  divalproex (DEPAKOTE SPRINKLE) 125 MG capsule Take 250 mg by mouth 2 (two) times daily.   Yes [provider]   donepezil (ARICEPT) 10 MG tablet Take 10 mg by mouth at bedtime.    Yes [provider]  memantine (NAMENDA) 10 MG tablet Take 10 mg by mouth 2 (two) times daily.   Yes [provider]  nitrofurantoin, macrocrystal-monohydrate, (MACROBID) 100 MG capsule Take 100 mg by mouth 2 (two) times daily. 05/02/20  Yes [provider]  POLYVINYL ALCOHOL-POVIDONE OP Place 2 drops into both eyes 3 (three) times daily. drops; 0.5-0.6 %; amt: 2 drops each eye; ophthalmic (eye)   Yes [provider]  QUEtiapine (SEROQUEL) 25 MG tablet Take 12.5 mg by mouth daily.    Yes [provider]  saccharomyces boulardii (FLORASTOR) 250 MG capsule Take 250 mg by mouth 2 (two) times daily. Pt is taking while on antibiotic   Yes [provider]  triamcinolone cream (KENALOG) 0.1 % Apply 1 application topically 2 (two) times daily as needed (rash on ankles). Apply to rash on ankles twice a day until healed.   Yes [provider]  Acetaminophen 325 MG CAPS Take 650 mg by mouth every 4 (four) hours as needed for fever. For fever over 100.6 for 48 hours. Do not exceed 3,000mg  in 24 hours    [provider]  QUEtiapine (SEROQUEL) 25 MG tablet Take 25 mg by mouth at bedtime.    [provider]     Family History  Family history unknown: Yes    Social History   Socioeconomic History  . Marital status: Single    Spouse name: Not on file  . Number of children: Not on file  . Years of education: Not on file  . Highest education level: Not on file  Occupational History  . Not on file  Tobacco Use  . Smoking status: Never Smoker  . Smokeless tobacco: Never Used  Vaping Use  . Vaping Use: Never used  Substance and Sexual Activity  . Alcohol use: Never  . Drug use: Never  . Sexual activity: Never  Other Topics Concern  . Not on file  Social History Narrative  . Not on file   Social Determinants of Health   Financial Resource Strain: Not  on file  Food Insecurity: Not on file  Transportation Needs: Not on file  Physical Activity: Not on file  Stress: Not on file  Social Connections: Not on file     Review of Systems currently denies fever, headache, chest pain, dyspnea, cough, back pain, nausea, vomiting or bleeding. Does have some mild generalized abdominal discomfort and fatigue.  Vital Signs: BP 113/62   Pulse 73   Temp 98.2 F (36.8 C) (Oral)   Resp (!) 24   SpO2 98%   Physical Exam awake, answers simple questions okay. Chest with slightly diminished breath sounds bases. Heart with regular rate and rhythm. Abdomen soft, positive bowel sounds, some mild generalized tenderness to palpation. Extremities with full range of motion.  Imaging: CT Head Wo Contrast  Result Date: 05/02/2020 CLINICAL DATA:  Status post fall. EXAM: CT HEAD WITHOUT CONTRAST CT CERVICAL SPINE WITHOUT CONTRAST TECHNIQUE: Multidetector CT imaging of the head and cervical spine was performed following the standard protocol without intravenous contrast. Multiplanar CT image reconstructions of the cervical spine were also generated. COMPARISON:  None. FINDINGS: CT HEAD FINDINGS Brain: There is moderate severity cerebral atrophy with widening of the extra-axial spaces and ventricular dilatation. There are areas of decreased attenuation within the white matter tracts of the supratentorial brain, consistent with microvascular disease changes. Vascular: No hyperdense vessel or unexpected calcification. Skull: Normal. Negative for fracture or focal lesion. Sinuses/Orbits: No acute finding. Other: None. CT CERVICAL SPINE FINDINGS Alignment: Normal. Skull base and vertebrae: No acute fracture. No primary bone lesion or focal pathologic process. Soft tissues and spinal canal: No prevertebral fluid or swelling. No visible canal hematoma. Disc levels: Marked severity endplate sclerosis is seen at the levels of C4-C5 and C5-C6, with mild anterior osteophyte formation  noted at the levels of C2-C3 C6-C7 and C7-T1. Marked severity intervertebral disc space narrowing is seen at the levels of C4-C5 and C5-C6. Marked severity bilateral multilevel facet joint hypertrophy is noted. Upper chest: Mild biapical scarring and/or atelectasis is seen. Other: N/A IMPRESSION: 1. Moderate severity cerebral atrophy and microvascular disease changes of the supratentorial brain. 2. Marked severity degenerative changes of the cervical spine without evidence of an acute fracture or subluxation. Electronically Signed   By: Aram Candela M.D.   On: 05/02/2020 22:22   CT Cervical Spine Wo Contrast  Result Date: 05/02/2020 CLINICAL DATA:  Status post fall. EXAM: CT HEAD WITHOUT CONTRAST CT CERVICAL SPINE WITHOUT CONTRAST TECHNIQUE: Multidetector CT imaging of the head and cervical spine was performed following the standard protocol without intravenous contrast. Multiplanar CT image reconstructions of the cervical spine were also generated. COMPARISON:  None. FINDINGS: CT HEAD FINDINGS Brain: There is moderate severity cerebral atrophy with widening of the extra-axial spaces and ventricular dilatation. There are areas of decreased attenuation within the white matter tracts of the supratentorial brain, consistent with microvascular disease changes. Vascular: No hyperdense vessel or unexpected calcification. Skull: Normal. Negative for fracture or focal lesion. Sinuses/Orbits: No acute finding. Other: None. CT CERVICAL SPINE FINDINGS Alignment: Normal.  Skull base and vertebrae: No acute fracture. No primary bone lesion or focal pathologic process. Soft tissues and spinal canal: No prevertebral fluid or swelling. No visible canal hematoma. Disc levels: Marked severity endplate sclerosis is seen at the levels of C4-C5 and C5-C6, with mild anterior osteophyte formation noted at the levels of C2-C3 C6-C7 and C7-T1. Marked severity intervertebral disc space narrowing is seen at the levels of C4-C5 and  C5-C6. Marked severity bilateral multilevel facet joint hypertrophy is noted. Upper chest: Mild biapical scarring and/or atelectasis is seen. Other: N/A IMPRESSION: 1. Moderate severity cerebral atrophy and microvascular disease changes of the supratentorial brain. 2. Marked severity degenerative changes of the cervical spine without evidence of an acute fracture or subluxation. Electronically Signed   By: Aram Candelahaddeus  Houston M.D.   On: 05/02/2020 22:24   CT ABDOMEN PELVIS W CONTRAST  Result Date: 05/06/2020 CLINICAL DATA:  Abdominal pain EXAM: CT ABDOMEN AND PELVIS WITH CONTRAST TECHNIQUE: Multidetector CT imaging of the abdomen and pelvis was performed using the standard protocol following bolus administration of intravenous contrast. CONTRAST:  100mL OMNIPAQUE IOHEXOL 300 MG/ML  SOLN COMPARISON:  None. FINDINGS: Lower chest: Small bilateral pleural effusions. Bibasilar atelectasis. Hepatobiliary: No focal hepatic abnormality. There is a fluid collection noted along the dome of the liver. This appears to be subdiaphragmatic on the sagittal images. This may reflect a subcapsular fluid collection. This measures 10.7 x 7.6 x 4.8 cm. This also extends along the lateral surface of the upper liver. Prior cholecystectomy. Pancreas: No focal abnormality or ductal dilatation. Spleen: No focal abnormality.  Normal size. Adrenals/Urinary Tract: No adrenal abnormality. No focal renal abnormality. No stones or hydronephrosis. Urinary bladder is unremarkable. Stomach/Bowel: Postoperative changes in the sigmoid colon. Colonic diverticulosis. No active diverticulitis. Stomach and small bowel decompressed. Vascular/Lymphatic: Aortoiliac atherosclerosis. No evidence of aneurysm or adenopathy. Reproductive: Uterus and adnexa unremarkable.  No mass. Other: No free fluid or free air. Small bilateral inguinal hernias containing fat. Musculoskeletal: No acute bony abnormality. IMPRESSION: Large fluid collection along the dome of the  liver, likely subcapsular fluid collection of unknown etiology. This measures up to 10.7 cm. Colonic diverticulosis.  No active diverticulitis. Small bilateral pleural effusions.  Bibasilar atelectasis. Aortoiliac atherosclerosis. Bilateral inguinal hernias containing fat. Electronically Signed   By: Charlett NoseKevin  Dover M.D.   On: 05/06/2020 22:37   DG Chest Port 1 View  Result Date: 05/06/2020 CLINICAL DATA:  Questionable sepsis EXAM: PORTABLE CHEST 1 VIEW COMPARISON:  None. FINDINGS: Heart is normal size. Bibasilar atelectasis. No effusions. No acute bony abnormality. IMPRESSION: Bibasilar atelectasis. Electronically Signed   By: Charlett NoseKevin  Dover M.D.   On: 05/06/2020 19:41    Labs:  CBC: Recent Labs    01/24/20 0000 03/28/20 0000 05/02/20 2225 05/06/20 1937  WBC 4.9 5.4 7.3 6.9  HGB 11.4* 13.2 11.4* 11.3*  HCT 35* 40 36.1 35.3*  PLT 177 199 132* 117*    COAGS: Recent Labs    05/06/20 1937  INR 1.2  APTT 36    BMP: Recent Labs    07/12/19 0000 09/15/19 0000 11/01/19 0000 12/27/19 0000 01/24/20 0000 03/28/20 0000 05/02/20 2225 05/06/20 1937  NA 141 140 140   < > 132* 133* 134* 136  K 4.4 4.0 4.8   < > 4.3 4.6 3.6 3.6  CL 104 100 103   < > 96* 96* 98 103  CO2 30* 30* 29*   < > 29* 25* 28 23  GLUCOSE  --   --   --   --   --   --  118* 131*  BUN 13 50* 15   < > 10 17 27* 17  CALCIUM 9.1 9.3 9.3   < > 8.5* 9.4 9.4 8.3*  CREATININE 1.0 1.2* 1.0   < > 0.9 1.0 1.66* 1.12*  GFRNONAA 53 38 50  --  58  --  30* 48*  GFRAA 61 44 58  --  68  --   --   --    < > = values in this interval not displayed.    LIVER FUNCTION TESTS: Recent Labs    12/27/19 0000 01/24/20 0000 03/28/20 0000 05/06/20 1937  BILITOT  --   --   --  0.5  AST 12* 12* 12* 66*  ALT 6* 8 6* 108*  ALKPHOS 58 51 66 183*  PROT  --   --   --  6.2*  ALBUMIN 3.5 3.2* 3.7 2.7*    TUMOR MARKERS: No results for input(s): AFPTM, CEA, CA199, CHROMGRNA in the last 8760 hours.  Assessment and Plan: 86 y.o. female  with past medical history of arthritis, dementia, hyperlipidemia, vitamin D deficiency who was seen in [facility], ED on 12/9 with fever, diagnosed with urinary tract infection discharged on antibiotics. She now presents again to [facility] long ED with persistent fever, chills, abdominal discomfort and imaging revealing a large fluid collection along the dome of the liver likely subcapsular fluid of unknown etiology, diverticulosis, small bilateral pleural effusions, bilateral inguinal hernias containing fat. She is COVID-19 negative. Blood cultures pending. WBC normal, hemoglobin 11.3, platelets 117k, PT/INR normal. Request now received from [facility] for image guided aspiration/possible drainage of abdominal fluid collection. Imaging studies were reviewed by Dr. Fredia Sorrow. Area in question appears loculated and in a subdiaphragmatic location which may be difficult to get to percutaneously. We will look by ultrasound to see if fluid collection is approachable and proceed if safe to do so. Details/risks of above, including but not limited to, internal bleeding, infection, injury to adjacent structures, inability to aspirate fluid due to location discussed with patient/patient's power of attorney [name] with her understanding and consent. Procedure scheduled for this afternoon.   Thank you for this interesting consult.  I greatly enjoyed meeting Navpreet Szczygiel and look forward to participating in their care.  A copy of this report was sent to the requesting provider on this date.  Electronically Signed: D. Jeananne Rama, PA-C 05/07/2020, 9:40 AM   I spent a total of  25 minutes  in face to face in clinical consultation, greater than 50% of which was counseling/coordinating care for image guided aspiration/possible drainage of upper abdominal fluid collection

## 2020-05-07 NOTE — Progress Notes (Signed)
PROGRESS NOTE    Sarah Larsen  JEH:[id number] DOB: [birth year] DOA: 05/06/2020 PCP: [name], MD   Chief Complain: Fever  Brief Narrative: Patient is a history of dementia, liver abscess was brought to the emergency department for evaluation of fever from friend's home.  She was recently diagnosed with UTI and was discharged antibiotics.  There is report of persistent fever and chills at home and also some abdominal discomfort.  On plantation she was febrile, had elevated liver enzymes.  CT abdomen/pelvis with 10.7 cm fluid collection around the dome of the liver.  Blood cultures obtained, started on IV antibiotics.  IR consulted for possible liver abscess aspiration.  Assessment & Plan:   Active Problems:   Vascular dementia (HCC)   Hyperlipidemia   Fever   Intraabdominal fluid collection   Suspected liver abscess: CT abdomen/pelvis as above.  IR consulted.  IR doing an ultrasound of the liver for confirmation. As per the report, she had liver abscess in the past.  Continue Zosyn for now.  Follow-up blood cultures. She complains of some abdominal discomfort and she has some distention but not obviously tender.  Elevated liver enzymes: Most likely secondary to fluid collection of the liver.  Will check CMP tomorrow.  History of dementia: Continue supportive care.  She is on Depakote, Klonopin, Namenda and Seroquel at home  AKI: Continue gentle IV fluids.  Check renal function tomorrow  Normocytic anemia: Chronic.  Hemoglobin currently stable.         DVT prophylaxis:SCD Code Status: DNR Family Communication: None at bedside Status is: Inpatient  Remains inpatient appropriate because:Inpatient level of care appropriate due to severity of illness   Dispo: The patient is from: Group home              Anticipated d/c is to: Group home              Anticipated d/c date is: 2 days              Patient currently is not medically stable to d/c.      Consultants:  IR  Procedures:None yet  Antimicrobials:  Anti-infectives (From admission, onward)   Start     Dose/Rate Route Frequency Ordered Stop   05/07/20 0800  piperacillin-tazobactam (ZOSYN) IVPB 3.375 g        3.375 g 12.5 mL/hr over 240 Minutes Intravenous Every 8 hours 05/07/20 0051     05/06/20 2315  piperacillin-tazobactam (ZOSYN) IVPB 3.375 g        3.375 g 100 mL/hr over 30 Minutes Intravenous  Once 05/06/20 2302 05/07/20 0015      Subjective:  Patient seen and examined at the bedside this morning.  Hemodynamically stable.  Lying on the bed, sleeping when I arrived.  She is confused and is not oriented but is alert and awake.  Complains of some abdominal discomfort in the right upper quadrant.  Objective: Vitals:   05/07/20 0744 05/07/20 0922 05/07/20 0930 05/07/20 1022  BP: 111/82 113/62 118/62 (!) 111/58  Pulse: 72 73 70 68  Resp: (!) 24 (!) 24 (!) 26 16  Temp:    98 F (36.7 C)  TempSrc:    Oral  SpO2: 99% 98% 97% 98%    Intake/Output Summary (Last 24 hours) at 05/07/2020 1104 Last data filed at 05/07/2020 0015 Gross per 24 hour  Intake 2050 ml  Output 280 ml  Net 1770 ml   There were no vitals filed for this visit.  Examination:  General exam: Elderly female, overall comfortable HEENT:PERRL,Oral mucosa moist, Ear/Nose normal on gross exam Respiratory system: Bilateral equal air entry, normal vesicular breath sounds, no wheezes or crackles  Cardiovascular system: S1 & S2 heard, RRR. No JVD, murmurs, rubs, gallops or clicks. No pedal edema. Gastrointestinal system: Abdomen is distended, soft with no obvious tenderness. No organomegaly or masses felt. Normal bowel sounds heard. Central nervous system: Alert awake but not oriented Extremities: No edema, no clubbing ,no cyanosis Skin: No rashes, lesions or ulcers,no icterus ,no pallor   Data Reviewed: I have personally reviewed following labs and imaging studies  CBC: Recent Labs  Lab 05/02/20 2225  05/06/20 1937  WBC 7.3 6.9  NEUTROABS 6.5 5.3  HGB 11.4* 11.3*  HCT 36.1 35.3*  MCV 93.0 93.1  PLT 132* 117*   Basic Metabolic Panel: Recent Labs  Lab 05/02/20 2225 05/06/20 1937  NA 134* 136  K 3.6 3.6  CL 98 103  CO2 28 23  GLUCOSE 118* 131*  BUN 27* 17  CREATININE 1.66* 1.12*  CALCIUM 9.4 8.3*   GFR: Estimated Creatinine Clearance: 37.6 mL/min (A) (by C-G formula based on SCr of 1.12 mg/dL (H)). Liver Function Tests: Recent Labs  Lab 05/06/20 1937  AST 66*  ALT 108*  ALKPHOS 183*  BILITOT 0.5  PROT 6.2*  ALBUMIN 2.7*   No results for input(s): LIPASE, AMYLASE in the last 168 hours. No results for input(s): AMMONIA in the last 168 hours. Coagulation Profile: Recent Labs  Lab 05/06/20 1937  INR 1.2   Cardiac Enzymes: No results for input(s): CKTOTAL, CKMB, CKMBINDEX, TROPONINI in the last 168 hours. BNP (last 3 results) No results for input(s): PROBNP in the last 8760 hours. HbA1C: No results for input(s): HGBA1C in the last 72 hours. CBG: Recent Labs  Lab 05/07/20 0639  GLUCAP 101*   Lipid Profile: No results for input(s): CHOL, HDL, LDLCALC, TRIG, CHOLHDL, LDLDIRECT in the last 72 hours. Thyroid Function Tests: No results for input(s): TSH, T4TOTAL, FREET4, T3FREE, THYROIDAB in the last 72 hours. Anemia Panel: No results for input(s): VITAMINB12, FOLATE, FERRITIN, TIBC, IRON, RETICCTPCT in the last 72 hours. Sepsis Labs: Recent Labs  Lab 05/03/20 0209 05/06/20 1937  LATICACIDVEN 1.3 1.5    Recent Results (from the past 240 hour(s))  Blood culture (routine x 2)     Status: None (Preliminary result)   Collection Time: 05/03/20  2:00 AM   Specimen: BLOOD  Result Value Ref Range Status   Specimen Description BLOOD RIGHT ANTECUBITAL  Final   Special Requests   Final    BOTTLES DRAWN AEROBIC AND ANAEROBIC Blood Culture adequate volume   Culture   Final    NO GROWTH 4 DAYS Performed at [facility], [address] [place], [place]  [id number]    Report Status PENDING  Incomplete  Blood culture (routine x 2)     Status: None (Preliminary result)   Collection Time: 05/03/20  3:00 AM   Specimen: BLOOD RIGHT HAND  Result Value Ref Range Status   Specimen Description BLOOD RIGHT HAND  Final   Special Requests   Final    BOTTLES DRAWN AEROBIC AND ANAEROBIC Blood Culture results may not be optimal due to an inadequate volume of blood received in culture bottles   Culture   Final    NO GROWTH 4 DAYS Performed at [facility], [address] [place], [place] [address]    Report Status PENDING  Incomplete  Urine culture  Status: Abnormal   Collection Time: 05/03/20  3:26 AM   Specimen: Urine, Random  Result Value Ref Range Status   Specimen Description URINE, RANDOM  Final   Special Requests NONE  Final   Culture (A)  Final    <10,000 COLONIES/mL INSIGNIFICANT GROWTH Performed at [facility], [address] [place], [place] [address]    Report Status 05/04/2020 FINAL  Final  Blood Culture (routine x 2)     Status: None (Preliminary result)   Collection Time: 05/06/20  7:37 PM   Specimen: BLOOD LEFT FOREARM  Result Value Ref Range Status   Specimen Description   Final    BLOOD LEFT FOREARM Performed at [facility], [address] [place], [place] [address]    Special Requests   Final    BOTTLES DRAWN AEROBIC AND ANAEROBIC Blood Culture adequate volume Performed at [facility], [address], [place], [place] [address]    Culture   Final    NO GROWTH < 12 HOURS Performed at [facility], [address] [place], [place] [address]    Report Status PENDING  Incomplete  Resp Panel by RT-PCR (Flu A&B, Covid) Nasopharyngeal Swab     Status: None   Collection Time: 05/06/20  7:37 PM   Specimen: Nasopharyngeal Swab; Nasopharyngeal(NP) swabs in vial transport medium  Result Value Ref Range Status   SARS Coronavirus 2 by RT PCR NEGATIVE NEGATIVE Final    Comment:  (NOTE) SARS-CoV-2 target nucleic acids are NOT DETECTED.  The SARS-CoV-2 RNA is generally detectable in upper respiratory specimens during the acute phase of infection. The lowest concentration of SARS-CoV-2 viral copies this assay can detect is 138 copies/mL. A negative result does not preclude SARS-Cov-2 infection and should not be used as the sole basis for treatment or other patient management decisions. A negative result may occur with  improper specimen collection/handling, submission of specimen other than nasopharyngeal swab, presence of viral mutation(s) within the areas targeted by this assay, and inadequate number of viral copies(<138 copies/mL). A negative result must be combined with clinical observations, patient history, and epidemiological information. The expected result is Negative.  Fact Sheet for Patients:  BloggerCourse.com  Fact Sheet for Healthcare Providers:  SeriousBroker.it  This test is no t yet approved or cleared by the [place] FDA and  has been authorized for detection and/or diagnosis of SARS-CoV-2 by FDA under an Emergency Use Authorization (EUA). This EUA will remain  in effect (meaning this test can be used) for the duration of the COVID-19 declaration under Section 564(b)(1) of the Act, 21 U.S.C.section 360bbb-3(b)(1), unless the authorization is terminated  or revoked sooner.       Influenza A by PCR NEGATIVE NEGATIVE Final   Influenza B by PCR NEGATIVE NEGATIVE Final    Comment: (NOTE) The Xpert Xpress SARS-CoV-2/FLU/RSV plus assay is intended as an aid in the diagnosis of influenza from Nasopharyngeal swab specimens and should not be used as a sole basis for treatment. Nasal washings and aspirates are unacceptable for Xpert Xpress SARS-CoV-2/FLU/RSV testing.  Fact Sheet for Patients: BloggerCourse.com  Fact Sheet for Healthcare  Providers: SeriousBroker.it  This test is not yet approved or cleared by the [place] FDA and has been authorized for detection and/or diagnosis of SARS-CoV-2 by FDA under an Emergency Use Authorization (EUA). This EUA will remain in effect (meaning this test can be used) for the duration of the COVID-19 declaration under Section 564(b)(1) of the  Act, 21 U.S.C. section 360bbb-3(b)(1), unless the authorization is terminated or revoked.  Performed at [facility], [address], [place], [place] [address]          Radiology Studies: CT ABDOMEN PELVIS W CONTRAST  Result Date: 05/06/2020 CLINICAL DATA:  Abdominal pain EXAM: CT ABDOMEN AND PELVIS WITH CONTRAST TECHNIQUE: Multidetector CT imaging of the abdomen and pelvis was performed using the standard protocol following bolus administration of intravenous contrast. CONTRAST:  OMNIPAQUE IOHEXOL 300 MG/ML  SOLN COMPARISON:  None. FINDINGS: Lower chest: Small bilateral pleural effusions. Bibasilar atelectasis. Hepatobiliary: No focal hepatic abnormality. There is a fluid collection noted along the dome of the liver. This appears to be subdiaphragmatic on the sagittal images. This may reflect a subcapsular fluid collection. This measures 10.7 x 7.6 x 4.8 cm. This also extends along the lateral surface of the upper liver. Prior cholecystectomy. Pancreas: No focal abnormality or ductal dilatation. Spleen: No focal abnormality.  Normal size. Adrenals/Urinary Tract: No adrenal abnormality. No focal renal abnormality. No stones or hydronephrosis. Urinary bladder is unremarkable. Stomach/Bowel: Postoperative changes in the sigmoid colon. Colonic diverticulosis. No active diverticulitis. Stomach and small bowel decompressed. Vascular/Lymphatic: Aortoiliac atherosclerosis. No evidence of aneurysm or adenopathy. Reproductive: Uterus and adnexa unremarkable.  No mass. Other: No free fluid or free air.  Small bilateral inguinal hernias containing fat. Musculoskeletal: No acute bony abnormality. IMPRESSION: Large fluid collection along the dome of the liver, likely subcapsular fluid collection of unknown etiology. This measures up to 10.7 cm. Colonic diverticulosis.  No active diverticulitis. Small bilateral pleural effusions.  Bibasilar atelectasis. Aortoiliac atherosclerosis. Bilateral inguinal hernias containing fat. Electronically Signed   By: Charlett Nose M.D.   On: 05/06/2020 22:37   DG Chest Port 1 View  Result Date: 05/06/2020 CLINICAL DATA:  Questionable sepsis EXAM: PORTABLE CHEST 1 VIEW COMPARISON:  None. FINDINGS: Heart is normal size. Bibasilar atelectasis. No effusions. No acute bony abnormality. IMPRESSION: Bibasilar atelectasis. Electronically Signed   By: Charlett Nose M.D.   On: 05/06/2020 19:41        Scheduled Meds: . clonazePAM  0.5 mg Oral QHS  . divalproex  250 mg Oral TID  . memantine  10 mg Oral BID  . QUEtiapine  12.5 mg Oral Daily  . QUEtiapine  25 mg Oral QHS   Continuous Infusions: . dextrose 5 % and 0.9% NaCl 75 mL/hr at 05/07/20 0140  . piperacillin-tazobactam (ZOSYN)  IV 3.375 g (05/07/20 0825)     LOS: 1 day    Time spent: 25 mins.More than 50% of that time was spent in counseling and/or coordination of care.      Burnadette Pop, MD [facility] P12/13/2021, 11:04 AM

## 2020-05-08 ENCOUNTER — Other Ambulatory Visit: Payer: Self-pay

## 2020-05-08 LAB — COMPREHENSIVE METABOLIC PANEL
ALT: 64 U/L — ABNORMAL HIGH (ref 0–44)
AST: 30 U/L (ref 15–41)
Albumin: 2.4 g/dL — ABNORMAL LOW (ref 3.5–5.0)
Alkaline Phosphatase: 152 U/L — ABNORMAL HIGH (ref 38–126)
Anion gap: 10 (ref 5–15)
BUN: 12 mg/dL (ref 8–23)
CO2: 24 mmol/L (ref 22–32)
Calcium: 8.2 mg/dL — ABNORMAL LOW (ref 8.9–10.3)
Chloride: 106 mmol/L (ref 98–111)
Creatinine, Ser: 0.93 mg/dL (ref 0.44–1.00)
GFR, Estimated: >60 mL/min (ref >60)
Glucose, Bld: 98 mg/dL (ref 70–99)
Potassium: 3.2 mmol/L — ABNORMAL LOW (ref 3.5–5.1)
Sodium: 140 mmol/L (ref 135–145)
Total Bilirubin: 0.6 mg/dL (ref 0.3–1.2)
Total Protein: 5.7 g/dL — ABNORMAL LOW (ref 6.5–8.1)

## 2020-05-08 LAB — CBC
HCT: 32.4 % — ABNORMAL LOW (ref 36.0–46.0)
Hemoglobin: 10.5 g/dL — ABNORMAL LOW (ref 12.0–15.0)
MCH: 30.2 pg (ref 26.0–34.0)
MCHC: 32.4 g/dL (ref 30.0–36.0)
MCV: 93.1 fL (ref 80.0–100.0)
Platelets: 138 10*3/uL — ABNORMAL LOW (ref 150–400)
RBC: 3.48 MIL/uL — ABNORMAL LOW (ref 3.87–5.11)
RDW: 15.7 % — ABNORMAL HIGH (ref 11.5–15.5)
WBC: 6 10*3/uL (ref 4.0–10.5)
nRBC: 0 % (ref 0.0–0.2)

## 2020-05-08 LAB — GLUCOSE, CAPILLARY
Glucose-Capillary: 79 mg/dL (ref 70–99)
Glucose-Capillary: 79 mg/dL (ref 70–99)
Glucose-Capillary: 80 mg/dL (ref 70–99)
Glucose-Capillary: 93 mg/dL (ref 70–99)
Glucose-Capillary: 93 mg/dL (ref 70–99)

## 2020-05-08 LAB — CULTURE, BLOOD (ROUTINE X 2)
Culture: NO GROWTH
Culture: NO GROWTH
Special Requests: ADEQUATE

## 2020-05-08 MED ORDER — POTASSIUM CHLORIDE CRYS ER 20 MEQ PO TBCR
40.0000 meq | EXTENDED_RELEASE_TABLET | Freq: Once | ORAL | Status: AC
  Administered 2020-05-08: 11:00:00 40 meq via ORAL
  Filled 2020-05-08: qty 2

## 2020-05-08 MED ORDER — LIP MEDEX EX OINT
TOPICAL_OINTMENT | CUTANEOUS | Status: AC
  Filled 2020-05-08: qty 7

## 2020-05-08 NOTE — Progress Notes (Signed)
Referring Physician(s): Arvilla Market  Supervising Physician: Gilmer Mor  Patient Status:  [facility] - In-pt  Chief Complaint:  Abdominal pain/fluid collection  Subjective: Pt resting quietly in bed; niece in room;  eyes closed; responds briefly to questions; has some mild soreness at RUQ drain site; no resp problems, N/V   Allergies: Fish-derived products and Pravachol [pravastatin sodium]  Medications: Prior to Admission medications   Medication Sig Start Date End Date Taking? Authorizing Provider  CALCIUM-VITAMIN D PO Take 1 tablet by mouth in the morning and at bedtime. 600mg  (1,500mg ) - 400 unit   Yes [provider]  clonazePAM (KLONOPIN) 0.5 MG tablet Take 0.5 mg by mouth at bedtime.   Yes [provider]  divalproex (DEPAKOTE SPRINKLE) 125 MG capsule Take 250 mg by mouth 2 (two) times daily.   Yes [provider]  donepezil (ARICEPT) 10 MG tablet Take 10 mg by mouth at bedtime.    Yes [provider]  memantine (NAMENDA) 10 MG tablet Take 10 mg by mouth 2 (two) times daily.   Yes [provider]  nitrofurantoin, macrocrystal-monohydrate, (MACROBID) 100 MG capsule Take 100 mg by mouth 2 (two) times daily. 05/02/20  Yes [provider]  POLYVINYL ALCOHOL-POVIDONE OP Place 2 drops into both eyes 3 (three) times daily. drops; 0.5-0.6 %; amt: 2 drops each eye; ophthalmic (eye)   Yes [provider]  QUEtiapine (SEROQUEL) 25 MG tablet Take 12.5 mg by mouth daily.    Yes [provider]  saccharomyces boulardii (FLORASTOR) 250 MG capsule Take 250 mg by mouth 2 (two) times daily. Pt is taking while on antibiotic   Yes [provider]  triamcinolone cream (KENALOG) 0.1 % Apply 1 application topically 2 (two) times daily as needed (rash on ankles). Apply to rash on ankles twice a day until healed.   Yes [provider]  Acetaminophen 325 MG CAPS Take 650 mg by mouth every 4 (four) hours as needed  for fever. For fever over 100.6 for 48 hours. Do not exceed 3,000mg  in 24 hours    [provider]  QUEtiapine (SEROQUEL) 25 MG tablet Take 25 mg by mouth at bedtime.    [provider]     Vital Signs: BP (!) 132/58 (BP Location: Right Arm)   Pulse 67   Temp 97.6 F (36.4 C) (Oral)   Resp 18   SpO2 95%   Physical Exam drowsy but arousable; RUQ drain intact, insertion site mildly tender, OP 260 cc reddish brown fluid with tissue fragments; drain flushed without difficulty  Imaging: CT ABDOMEN PELVIS W CONTRAST  Result Date: 05/06/2020 CLINICAL DATA:  Abdominal pain EXAM: CT ABDOMEN AND PELVIS WITH CONTRAST TECHNIQUE: Multidetector CT imaging of the abdomen and pelvis was performed using the standard protocol following bolus administration of intravenous contrast. CONTRAST:  14/04/2020 OMNIPAQUE IOHEXOL 300 MG/ML  SOLN COMPARISON:  None. FINDINGS: Lower chest: Small bilateral pleural effusions. Bibasilar atelectasis. Hepatobiliary: No focal hepatic abnormality. There is a fluid collection noted along the dome of the liver. This appears to be subdiaphragmatic on the sagittal images. This may reflect a subcapsular fluid collection. This measures 10.7 x 7.6 x 4.8 cm. This also extends along the lateral surface of the upper liver. Prior cholecystectomy. Pancreas: No focal abnormality or ductal dilatation. Spleen: No focal abnormality.  Normal size. Adrenals/Urinary Tract: No adrenal abnormality. No focal renal abnormality. No stones or hydronephrosis. Urinary bladder is unremarkable. Stomach/Bowel: Postoperative changes in the sigmoid colon. Colonic diverticulosis. No active  diverticulitis. Stomach and small bowel decompressed. Vascular/Lymphatic: Aortoiliac atherosclerosis. No evidence of aneurysm or adenopathy. Reproductive: Uterus and adnexa unremarkable.  No mass. Other: No free fluid or free air. Small bilateral inguinal hernias containing fat. Musculoskeletal: No acute bony  abnormality. IMPRESSION: Large fluid collection along the dome of the liver, likely subcapsular fluid collection of unknown etiology. This measures up to 10.7 cm. Colonic diverticulosis.  No active diverticulitis. Small bilateral pleural effusions.  Bibasilar atelectasis. Aortoiliac atherosclerosis. Bilateral inguinal hernias containing fat. Electronically Signed   By: Charlett Nose M.D.   On: 05/06/2020 22:37   DG Chest Port 1 View  Result Date: 05/06/2020 CLINICAL DATA:  Questionable sepsis EXAM: PORTABLE CHEST 1 VIEW COMPARISON:  None. FINDINGS: Heart is normal size. Bibasilar atelectasis. No effusions. No acute bony abnormality. IMPRESSION: Bibasilar atelectasis. Electronically Signed   By: Charlett Nose M.D.   On: 05/06/2020 19:41   [place] IMAGE GUIDED FLUID DRAIN BY CATHETER  Result Date: 05/07/2020 INDICATION: Fluid collection superior to the dome of the liver and subdiaphragmatic in location. EXAM: ULTRASOUND-GUIDED PERCUTANEOUS CATHETER DRAINAGE PERIHEPATIC ABSCESS MEDICATIONS: No additional medications other than sedative medications documented below. ANESTHESIA/SEDATION: Fentanyl 25 mcg IV; Versed 0.5 mg IV Moderate Sedation Time:  12 minutes. The patient was continuously monitored during the procedure by the interventional radiology nurse under my direct supervision. COMPLICATIONS: None immediate. PROCEDURE: Informed written consent was obtained from the patient after a thorough discussion of the procedural risks, benefits and alternatives. All questions were addressed. Maximal Sterile Barrier Technique was utilized including caps, mask, sterile gowns, sterile gloves, sterile drape, hand hygiene and skin antiseptic. A timeout was performed prior to the initiation of the procedure. Ultrasound was used to localize a fluid collection superior to the dome of the liver. Under direct ultrasound guidance, and following infiltration of 1% lidocaine for local anesthesia, a 5 [place] Yueh centesis catheter was  inserted over a 19 gauge needle into the fluid collection from a lateral approach. Aspiration of fluid was performed. A 20 mL sample was collected and sent for culture analysis. The Yueh catheter was removed over a guidewire. The tract was dilated to 10 [place]. A 10 French percutaneous drainage catheter was then advanced into the collection. Catheter position was confirmed by ultrasound. The catheter was flushed with saline and connected to a suction bulb. It was secured at the skin with a Prolene retention suture and StatLock device. FINDINGS: Aspiration at the level of the lentiform shaped suprahepatic fluid collection yielded grossly purulent fluid. Due to nature of fluid, a percutaneous drainage catheter was placed. There is good return of fluid from the drain after placement and attachment to suction bulb drainage. IMPRESSION: Ultrasound-guided aspiration of the right-sided suprahepatic/infradiaphragmatic fluid collection yielded grossly purulent fluid. A sample was sent for culture analysis. A 10 French percutaneous drainage catheter was placed and attached to suction bulb drainage. Electronically Signed   By: Irish Lack M.D.   On: 05/07/2020 15:51    Labs:  CBC: Recent Labs    03/28/20 0000 05/02/20 2225 05/06/20 1937 05/08/20 0704  WBC 5.4 7.3 6.9 6.0  HGB 13.2 11.4* 11.3* 10.5*  HCT 40 36.1 35.3* 32.4*  PLT 199 132* 117* 138*    COAGS: Recent Labs    05/06/20 1937  INR 1.2  APTT 36    BMP: Recent Labs    07/12/19 0000 09/15/19 0000 11/01/19 0000 12/27/19 0000 01/24/20 0000 03/28/20 0000 05/02/20 2225 05/06/20 1937 05/08/20 0446  NA 141 140 140   < >  132* 133* 134* 136 140  K 4.4 4.0 4.8   < > 4.3 4.6 3.6 3.6 3.2*  CL 104 100 103   < > 96* 96* 98 103 106  CO2 30* 30* 29*   < > 29* 25* 28 23 24   GLUCOSE  --   --   --   --   --   --  118* 131* 98  BUN 13 50* 15   < > 10 17 27* 17 12  CALCIUM 9.1 9.3 9.3   < > 8.5* 9.4 9.4 8.3* 8.2*  CREATININE 1.0 1.2* 1.0   <  > 0.9 1.0 1.66* 1.12* 0.93  GFRNONAA 53 38 50  --  58  --  30* 48* >60  GFRAA 61 44 58  --  68  --   --   --   --    < > = values in this interval not displayed.    LIVER FUNCTION TESTS: Recent Labs    01/24/20 0000 03/28/20 0000 05/06/20 1937 05/08/20 0446  BILITOT  --   --  0.5 0.6  AST 12* 12* 66* 30  ALT 8 6* 108* 64*  ALKPHOS 51 66 183* 152*  PROT  --   --  6.2* 5.7*  ALBUMIN 3.2* 3.7 2.7* 2.4*    Assessment and Plan: Pt with hx recent fevers, abd pain, fluid collection superior to dome of liver, s/p drain placement 12/13; currently afebrile; WBC nl; hgb 10.5, K 3.2- replace; creat nl; drain fluid cx pend; cont drain irrigation/OP monitoring; once OP minimal , obtain f/u CT   Electronically Signed: D. 1/14, PA-C 05/08/2020, 11:32 AM   I spent a total of 15 minutes at the the patient's bedside AND on the patient's hospital floor or unit, greater than 50% of which was counseling/coordinating care for right upper abdominal/suprahepatic fluid collection drain    Patient ID: 05/10/2020, female   DOB: Jun 11, 1932, 86 y.o.   MRN: [id number]

## 2020-05-08 NOTE — Progress Notes (Signed)
PROGRESS NOTE    Sarah Larsen  UQJ:[id number] DOB: Nov 19, 1932 DOA: 05/06/2020 PCP: [name], MD   Chief Complain: Fever  Brief Narrative: Patient is a history of dementia, liver abscess,a retired Psychologist, counselling, who was brought to the emergency department for evaluation of fever from memory care.  She was recently diagnosed with UTI and was on  antibiotics.  There is report of persistent fever and chills at home and also some abdominal discomfort.  On presentation, she was febrile, had elevated liver enzymes.  CT abdomen/pelvis with 10.7 cm fluid collection around the dome of the liver.  Blood cultures obtained, started on IV antibiotics.  IR consulted and she underwent   liver abscess aspiration on 05/07/20.  Assessment & Plan:   Active Problems:   Vascular dementia (HCC)   Hyperlipidemia   Fever   Intraabdominal fluid collection   Suspected liver abscess: Presented with abdominal discomfort.   CT abdomen/pelvis as above. IR consulted and she underwent   liver abscess aspiration on 05/07/20.  Status post drain placement We will follow body fluid culture,blood cultures As per the report, she had liver abscess in the past.  Continue Zosyn for now.  Most likely she will be discharged with drain when she is ready.  Will allow 1 or 2 days for cultures to be back.  Most likely she can be discharged on Augmentin for total of 4 weeks course,but we will follow up culture report before deciding antibiotic regimen.  Elevated liver enzymes: Most likely secondary to fluid collection of the liver.  Improving.  History of dementia: Continue supportive care.  She is on Depakote, Klonopin, Namenda and Seroquel at home.  She is confused at baseline.  She has high risk for delirium.  I will recommend keeping the room bright with the sunlight, frequent orientation.  AKI: Improved with IV fluids.  Hypokalemia: Supplemented with potassium.  Normocytic anemia: Chronic.  Hemoglobin currently  stable.  Debility/deconditioning: We will request for physical therapy/Occupational Therapy consult.  She lives in a memory care.  Thrombocytopenia: Mild.  Continue to monitor         DVT prophylaxis:SCD Code Status: DNR Family Communication: Extensive discussion held at the bedside with niece. Status is: Inpatient  Remains inpatient appropriate because:Inpatient level of care appropriate due to severity of illness   Dispo: The patient is from: Memory care              Anticipated d/c is to: Memory care              Anticipated d/c date is: 2 days              Patient currently is not medically stable to d/c.      Consultants: IR  Procedures:None yet  Antimicrobials:  Anti-infectives (From admission, onward)   Start     Dose/Rate Route Frequency Ordered Stop   05/07/20 0800  piperacillin-tazobactam (ZOSYN) IVPB 3.375 g        3.375 g 12.5 mL/hr over 240 Minutes Intravenous Every 8 hours 05/07/20 0051     05/06/20 2315  piperacillin-tazobactam (ZOSYN) IVPB 3.375 g        3.375 g 100 mL/hr over 30 Minutes Intravenous  Once 05/06/20 2302 05/07/20 0015      Subjective:  Patient seen and examined at the bedside this morning.  Hemodynamically stable.  Niece was at the bedside.  Still comfortable, eyes her eyes was closed and she spoke without opening her eyes.  She is obviously confused.  She was not in any kind of distress.  Denies any abdominal pain today..  Objective: Vitals:   05/07/20 1658 05/07/20 1730 05/07/20 2145 05/08/20 0609  BP: (!) 132/58 140/66 (!) 120/59 (!) 132/58  Pulse:  69 72 67  Resp: Temp: 98.1 F (36.7 C) 97.7 F (36.5 C) 98.1 F (36.7 C) 97.6 F (36.4 C)  TempSrc: Axillary Axillary Oral Oral  SpO2: 99% 100% 99% 95%    Intake/Output Summary (Last 24 hours) at 05/08/2020 0838 Last data filed at 05/08/2020 0700 Gross per 24 hour  Intake 2060.97 ml  Output 1310 ml  Net 750.97 ml   There were no vitals filed for this  visit.  Examination:  General exam: Elderly confused female, not in distress Respiratory system: Bilateral equal air entry, normal vesicular breath sounds, no wheezes or crackles  Cardiovascular system: S1 & S2 heard, RRR. No JVD, murmurs, rubs, gallops or clicks. Gastrointestinal system: Abdomen is nondistended, soft and nontender. No organomegaly or masses felt. Normal bowel sounds heard.  Right upper quadrant drain with scant fluid collection Central nervous system: Not oriented, obeys commands Extremities: No edema, no clubbing ,no cyanosis, Skin: No rashes, lesions or ulcers,no icterus ,no pallor   Data Reviewed: I have personally reviewed following labs and imaging studies  CBC: Recent Labs  Lab 05/02/20 2225 05/06/20 1937 05/08/20 0704  WBC 7.3 6.9 6.0  NEUTROABS 6.5 5.3  --   HGB 11.4* 11.3* 10.5*  HCT 36.1 35.3* 32.4*  MCV 93.0 93.1 93.1  PLT 132* 117* 138*   Basic Metabolic Panel: Recent Labs  Lab 05/02/20 2225 05/06/20 1937 05/08/20 0446  NA 134* 136 140  K 3.6 3.6 3.2*  CL 98 103 106  CO2 GLUCOSE 118* 131* 98  BUN 27* 17 12  CREATININE 1.66* 1.12* 0.93  CALCIUM 9.4 8.3* 8.2*   GFR: Estimated Creatinine Clearance: 45.3 mL/min (by C-G formula based on SCr of 0.93 mg/dL). Liver Function Tests: Recent Labs  Lab 05/06/20 1937 05/08/20 0446  AST 66* 30  ALT 108* 64*  ALKPHOS 183* 152*  BILITOT 0.5 0.6  PROT 6.2* 5.7*  ALBUMIN 2.7* 2.4*   No results for input(s): LIPASE, AMYLASE in the last 168 hours. No results for input(s): AMMONIA in the last 168 hours. Coagulation Profile: Recent Labs  Lab 05/06/20 1937  INR 1.2   Cardiac Enzymes: No results for input(s): CKTOTAL, CKMB, CKMBINDEX, TROPONINI in the last 168 hours. BNP (last 3 results) No results for input(s): PROBNP in the last 8760 hours. HbA1C: No results for input(s): HGBA1C in the last 72 hours. CBG: Recent Labs  Lab 05/07/20 1157 05/07/20 1803 05/08/20 0007  05/08/20 0606 05/08/20 0730  GLUCAP 100* 92 93 93 79   Lipid Profile: No results for input(s): CHOL, HDL, LDLCALC, TRIG, CHOLHDL, LDLDIRECT in the last 72 hours. Thyroid Function Tests: No results for input(s): TSH, T4TOTAL, FREET4, T3FREE, THYROIDAB in the last 72 hours. Anemia Panel: No results for input(s): VITAMINB12, FOLATE, FERRITIN, TIBC, IRON, RETICCTPCT in the last 72 hours. Sepsis Labs: Recent Labs  Lab 05/03/20 0209 05/06/20 1937  LATICACIDVEN 1.3 1.5    Recent Results (from the past 240 hour(s))  Blood culture (routine x 2)     Status: None (Preliminary result)   Collection Time: 05/03/20  2:00 AM   Specimen: BLOOD  Result Value Ref Range Status   Specimen Description BLOOD RIGHT ANTECUBITAL  Final   Special Requests   Final  BOTTLES DRAWN AEROBIC AND ANAEROBIC Blood Culture adequate volume   Culture   Final    NO GROWTH 4 DAYS Performed at [facility], [address] [place], [place] [address]    Report Status PENDING  Incomplete  Blood culture (routine x 2)     Status: None (Preliminary result)   Collection Time: 05/03/20  3:00 AM   Specimen: BLOOD RIGHT HAND  Result Value Ref Range Status   Specimen Description BLOOD RIGHT HAND  Final   Special Requests   Final    BOTTLES DRAWN AEROBIC AND ANAEROBIC Blood Culture results may not be optimal due to an inadequate volume of blood received in culture bottles   Culture   Final    NO GROWTH 4 DAYS Performed at [facility], [address] [place], [place] [address]    Report Status PENDING  Incomplete  Urine culture     Status: Abnormal   Collection Time: 05/03/20  3:26 AM   Specimen: Urine, Random  Result Value Ref Range Status   Specimen Description URINE, RANDOM  Final   Special Requests NONE  Final   Culture (A)  Final    <10,000 COLONIES/mL INSIGNIFICANT GROWTH Performed at [facility], [address], [place], [place] [address]    Report Status 05/04/2020 FINAL  Final  Urine  culture     Status: None   Collection Time: 05/06/20  7:37 PM   Specimen: Urine, Catheterized  Result Value Ref Range Status   Specimen Description   Final    URINE, CATHETERIZED Performed at [facility], [address] [place], [place] [address]    Special Requests   Final    NONE Performed at [facility], [address] [place], [place] [address]    Culture   Final    NO GROWTH Performed at [facility], [address], [place], [place] [address]    Report Status 05/07/2020 FINAL  Final  Blood Culture (routine x 2)     Status: None (Preliminary result)   Collection Time: 05/06/20  7:37 PM   Specimen: BLOOD LEFT FOREARM  Result Value Ref Range Status   Specimen Description   Final    BLOOD LEFT FOREARM Performed at [facility], [address] [place], [place] [address]    Special Requests   Final    BOTTLES DRAWN AEROBIC AND ANAEROBIC Blood Culture adequate volume Performed at [facility], [address] [place], [place] [address]    Culture   Final    NO GROWTH < 12 HOURS Performed at [facility], [address] [place], [place] [address]    Report Status PENDING  Incomplete  Resp Panel by RT-PCR (Flu A&B, Covid) Nasopharyngeal Swab     Status: None   Collection Time: 05/06/20  7:37 PM   Specimen: Nasopharyngeal Swab; Nasopharyngeal(NP) swabs in vial transport medium  Result Value Ref Range Status   SARS Coronavirus 2 by RT PCR NEGATIVE NEGATIVE Final    Comment: (NOTE) SARS-CoV-2 target nucleic acids are NOT DETECTED.  The SARS-CoV-2 RNA is generally detectable in upper respiratory specimens during the acute phase of infection. The lowest concentration of SARS-CoV-2 viral copies this assay can detect is 138 copies/mL. A negative result does not preclude SARS-Cov-2 infection and should not be used as the sole basis for treatment or other patient management decisions. A negative result  may occur with  improper specimen collection/handling, submission of specimen other than nasopharyngeal swab, presence of viral mutation(s) within the areas targeted by this assay, and inadequate number of viral copies(<138 copies/mL). A negative result must be combined with clinical observations, patient history, and epidemiological information. The expected result is Negative.  Fact Sheet for Patients:  BloggerCourse.comhttps://www.fda.gov/media/152166/download  Fact Sheet for Healthcare Providers:  SeriousBroker.ithttps://www.fda.gov/media/152162/download  This test is no t yet approved or cleared by the [place] FDA and  has been authorized for detection and/or diagnosis of SARS-CoV-2 by FDA under an Emergency Use Authorization (EUA). This EUA will remain  in effect (meaning this test can be used) for the duration of the COVID-19 declaration under Section 564(b)(1) of the Act, 21 U.S.C.section 360bbb-3(b)(1), unless the authorization is terminated  or revoked sooner.       Influenza A by PCR NEGATIVE NEGATIVE Final   Influenza B by PCR NEGATIVE NEGATIVE Final    Comment: (NOTE) The Xpert Xpress SARS-CoV-2/FLU/RSV plus assay is intended as an aid in the diagnosis of influenza from Nasopharyngeal swab specimens and should not be used as a sole basis for treatment. Nasal washings and aspirates are unacceptable for Xpert Xpress SARS-CoV-2/FLU/RSV testing.  Fact Sheet for Patients: BloggerCourse.comhttps://www.fda.gov/media/152166/download  Fact Sheet for Healthcare Providers: SeriousBroker.ithttps://www.fda.gov/media/152162/download  This test is not yet approved or cleared by the [place] FDA and has been authorized for detection and/or diagnosis of SARS-CoV-2 by FDA under an Emergency Use Authorization (EUA). This EUA will remain in effect (meaning this test can be used) for the duration of the COVID-19 declaration under Section 564(b)(1) of the Act, 21 U.S.C. section 360bbb-3(b)(1), unless the authorization is terminated  or revoked.  Performed at [facility], [address] [place], [place] [address]   Aerobic/Anaerobic Culture (surgical/deep wound)     Status: None (Preliminary result)   Collection Time: 05/07/20  2:58 PM   Specimen: Abscess  Result Value Ref Range Status   Specimen Description   Final    ABSCESS Performed at [facility], [address] [place], [place] [address]    Special Requests   Final    Normal Performed at [facility], [address] [place], [place] [address]    Gram Stain   Final    ABUNDANT WBC PRESENT,BOTH PMN AND MONONUCLEAR NO ORGANISMS SEEN Performed at [facility], [address] [place], [place] [address]    Culture PENDING  Incomplete   Report Status PENDING  Incomplete         Radiology Studies: CT ABDOMEN PELVIS W CONTRAST  Result Date: 05/06/2020 CLINICAL DATA:  Abdominal pain EXAM: CT ABDOMEN AND PELVIS WITH CONTRAST TECHNIQUE: Multidetector CT imaging of the abdomen and pelvis was performed using the standard protocol following bolus administration of intravenous contrast. CONTRAST:  100mL OMNIPAQUE IOHEXOL 300 MG/ML  SOLN COMPARISON:  None. FINDINGS: Lower chest: Small bilateral pleural effusions. Bibasilar atelectasis. Hepatobiliary: No focal hepatic abnormality. There is a fluid collection noted along the dome of the liver. This appears to be subdiaphragmatic on the sagittal images. This may reflect a subcapsular fluid collection. This measures 10.7 x 7.6 x 4.8 cm. This also extends along the lateral surface of the upper liver. Prior cholecystectomy. Pancreas: No focal abnormality or ductal dilatation. Spleen: No focal abnormality.  Normal size. Adrenals/Urinary Tract: No adrenal abnormality. No focal renal abnormality. No stones or hydronephrosis. Urinary bladder is unremarkable. Stomach/Bowel: Postoperative changes in the sigmoid colon. Colonic diverticulosis. No active  diverticulitis. Stomach and  small bowel decompressed. Vascular/Lymphatic: Aortoiliac atherosclerosis. No evidence of aneurysm or adenopathy. Reproductive: Uterus and adnexa unremarkable.  No mass. Other: No free fluid or free air. Small bilateral inguinal hernias containing fat. Musculoskeletal: No acute bony abnormality. IMPRESSION: Large fluid collection along the dome of the liver, likely subcapsular fluid collection of unknown etiology. This measures up to 10.7 cm. Colonic diverticulosis.  No active diverticulitis. Small bilateral pleural effusions.  Bibasilar atelectasis. Aortoiliac atherosclerosis. Bilateral inguinal hernias containing fat. Electronically Signed   By: Charlett Nose M.D.   On: 05/06/2020 22:37   DG Chest Port 1 View  Result Date: 05/06/2020 CLINICAL DATA:  Questionable sepsis EXAM: PORTABLE CHEST 1 VIEW COMPARISON:  None. FINDINGS: Heart is normal size. Bibasilar atelectasis. No effusions. No acute bony abnormality. IMPRESSION: Bibasilar atelectasis. Electronically Signed   By: Charlett Nose M.D.   On: 05/06/2020 19:41   [place] IMAGE GUIDED FLUID DRAIN BY CATHETER  Result Date: 05/07/2020 INDICATION: Fluid collection superior to the dome of the liver and subdiaphragmatic in location. EXAM: ULTRASOUND-GUIDED PERCUTANEOUS CATHETER DRAINAGE PERIHEPATIC ABSCESS MEDICATIONS: No additional medications other than sedative medications documented below. ANESTHESIA/SEDATION: Fentanyl 25 mcg IV; Versed 0.5 mg IV Moderate Sedation Time:  12 minutes. The patient was continuously monitored during the procedure by the interventional radiology nurse under my direct supervision. COMPLICATIONS: None immediate. PROCEDURE: Informed written consent was obtained from the patient after a thorough discussion of the procedural risks, benefits and alternatives. All questions were addressed. Maximal Sterile Barrier Technique was utilized including caps, mask, sterile gowns, sterile gloves, sterile drape, hand hygiene  and skin antiseptic. A timeout was performed prior to the initiation of the procedure. Ultrasound was used to localize a fluid collection superior to the dome of the liver. Under direct ultrasound guidance, and following infiltration of 1% lidocaine for local anesthesia, a 5 [place] Yueh centesis catheter was inserted over a 19 gauge needle into the fluid collection from a lateral approach. Aspiration of fluid was performed. A 20 mL sample was collected and sent for culture analysis. The Yueh catheter was removed over a guidewire. The tract was dilated to 10 [place]. A 10 French percutaneous drainage catheter was then advanced into the collection. Catheter position was confirmed by ultrasound. The catheter was flushed with saline and connected to a suction bulb. It was secured at the skin with a Prolene retention suture and StatLock device. FINDINGS: Aspiration at the level of the lentiform shaped suprahepatic fluid collection yielded grossly purulent fluid. Due to nature of fluid, a percutaneous drainage catheter was placed. There is good return of fluid from the drain after placement and attachment to suction bulb drainage. IMPRESSION: Ultrasound-guided aspiration of the right-sided suprahepatic/infradiaphragmatic fluid collection yielded grossly purulent fluid. A sample was sent for culture analysis. A 10 French percutaneous drainage catheter was placed and attached to suction bulb drainage. Electronically Signed   By: Irish Lack M.D.   On: 05/07/2020 15:51        Scheduled Meds: . clonazePAM  0.5 mg Oral QHS  . divalproex  250 mg Oral TID  . memantine  10 mg Oral BID  . potassium chloride  40 mEq Oral Once  . QUEtiapine  12.5 mg Oral Daily  . QUEtiapine  25 mg Oral QHS  . sodium chloride flush  5 mL Intracatheter Q8H   Continuous Infusions: . sodium chloride 250 mL (05/08/20 0733)  . piperacillin-tazobactam (ZOSYN)  IV 3.375 g (05/08/20 0736)     LOS: 2 days    Time spent:  25 mins.More  than 50% of that time was spent in counseling and/or coordination of care.      Burnadette Pop, MD [facility] P12/14/2021, 8:38 AM

## 2020-05-08 NOTE — Evaluation (Signed)
Clinical/Bedside Swallow Evaluation Patient Details  Name: Sarah Larsen MRN: [id number] Date of Birth: 24-Jan-1933  Today's Date: 05/08/2020 Time: SLP Start Time (ACUTE ONLY): 1235 SLP Stop Time (ACUTE ONLY): 1252 SLP Time Calculation (min) (ACUTE ONLY): 17 min  Past Medical History:  Past Medical History:  Diagnosis Date  . Arthritis   . Dementia (HCC)   . Hx of concussion    [facility]  . Hyperlipidemia   . Vitamin D deficiency, unspecified    Past Surgical History:  Past Surgical History:  Procedure Laterality Date  . CHOLECYSTECTOMY     HPI:  86 yo female adm to [facility] with weakness and fevers. Pt found to have a suprahepatic abscess and is s/p aspiration and drain placed.  Pt with h/o dementia.  CXR 12/12 showed bilateral ATX.  Pt with anterior cervical osteophytes - mild C2-C3, C6-C7, C7-T1 and brain imaging showed moderate atrophy.  Pt also diagnosed with a UTI and has been on ABX.  She resides at [facility] in [place]. Swallow eval ordered. Pt denies dysphagia.   Assessment / Plan / Recommendation Clinical Impression  Pt was found in room with her eyes closed being fed by her cousin with HOB elevated to approximately 30*.  SLP obtained assistance to slide pt up in bed and encouraged her to feed herself for airway protection.  CN exam unremarkable and pt benefited from verbal cues to open her eyes to participate in session. Swallow appeared swift and without indication of pharyngeal deficits.  Adequate mastication with oral clearance noted.   No indication of aspiration with all po observed including milk, icecream and graham crackers. Frequent belching noted with milk intake at end of session, pt admits to reflux, denies it being a problem and does not take a reflux medication.  Advised pt and her cousin to general aspiration precautions including recommendation to eat upright, feed herself for proprioceptive input and airway protection.  No SLP follow up  needed. SLP Visit Diagnosis: Dysphagia, unspecified (R13.10)    Aspiration Risk  Mild aspiration risk    Diet Recommendation Regular;Thin liquid   Liquid Administration via: Cup;Straw Medication Administration: Whole meds with liquid Supervision: Patient able to self feed (set up) Compensations: Slow rate;Small sips/bites    Other  Recommendations Oral Care Recommendations: Oral care BID   Follow up Recommendations None      Frequency and Duration   n/a         Prognosis   n/a     Swallow Study   General Date of Onset: 05/08/20 HPI: 86 yo female adm to [facility] with weakness and fevers. Pt found to have a suprahepatic abscess and is s/p aspiration and drain placed.  Pt with h/o dementia.  CXR 12/12 showed bilateral ATX.  Pt with anterior cervical osteophytes - mild C2-C3, C6-C7, C7-T1 and brain imaging showed moderate atrophy.  Pt also diagnosed with a UTI and has been on ABX.  She resides at [facility] in [place]. Swallow eval ordered. Pt denies dysphagia. Type of Study: Bedside Swallow Evaluation Previous Swallow Assessment: none in the system Diet Prior to this Study: Regular;Thin liquids Temperature Spikes Noted: No Respiratory Status: Room air History of Recent Intubation: No Behavior/Cognition: Alert;Cooperative;Pleasant mood (doesn't open her eyes) Oral Cavity Assessment: Within Functional Limits Oral Care Completed by SLP: No Oral Cavity - Dentition: Adequate natural dentition Vision: Functional for self-feeding Self-Feeding Abilities: Able to feed self;Other (Comment) (needed encouragement to do so) Patient Positioning: Upright in bed Baseline Vocal Quality:  Normal Volitional Cough: Strong Volitional Swallow: Able to elicit    Oral/Motor/Sensory Function Overall Oral Motor/Sensory Function: Within functional limits   Ice Chips Ice chips: Not tested   Thin Liquid Thin Liquid: Within functional limits Presentation: Straw    Nectar Thick Nectar Thick Liquid: Not  tested   Honey Thick Honey Thick Liquid: Not tested   Puree Puree: Within functional limits Presentation: Self Fed;Spoon   Solid     Solid: Within functional limits Presentation: Self Orvan July 05/08/2020,1:05 PM   Rolena Infante, MS [facility] SLP Acute Rehab Services Office [phone] Pager [phone]

## 2020-05-08 NOTE — Evaluation (Addendum)
Physical Therapy Evaluation Patient Details Name: Sarah Larsen MRN: [id number] DOB: 1932/07/24 Today's Date: 05/08/2020   History of Present Illness  86 year old female admitted 05/06/20 with fever, found to have suprahepatic abscess, s/p drain placement 05/07/20. PMH of dementia. Pt is from [facility].  Clinical Impression  Pt admitted with above diagnosis. Total assist (pt 15%) for supine to sit. Pt sat at edge of bed for ~8 minutes and performed forward reaching, L/R weight shifting and trunk rotation AROM. Sit to stand with RW with max assist, pt took 3 side steps at edge of bed. Pt lethargic today, she mostly kept her eyes closed during PT session.  Pt currently with functional limitations due to the deficits listed below (see PT Problem List). Pt will benefit from skilled PT to increase their independence and safety with mobility to allow discharge to the venue listed below.       Follow Up Recommendations SNF;Supervision/Assistance - 24 hour    Equipment Recommendations  Wheelchair (measurements PT);Wheelchair cushion (measurements PT);Rolling walker with 5" wheels    Recommendations for Other Services       Precautions / Restrictions Precautions Precautions: Fall Precaution Comments: pt's cousin Dorothyann Gibbs reports pt had several falls recently Restrictions Weight Bearing Restrictions: No      Mobility  Bed Mobility Overal bed mobility: Needs Assistance Bed Mobility: Supine to Sit;Sit to Supine     Supine to sit: +2 for physical assistance;Total assist Sit to supine: +2 for physical assistance;Total assist   General bed mobility comments: assist to raise trunk and advance BLEs/pivot hips with pad, pt 15%; assist to control trunk descent  pt sat EOB x 9 minutes, performed forward reaching, L/R weight shift, trunk rotation AROM to L and R    Transfers Overall transfer level: Needs assistance Equipment used: Rolling walker (2 wheeled) Transfers: Sit to/from Stand Sit to  Stand: From elevated surface;Max assist         General transfer comment: VCs hand placement, assist to power up  Ambulation/Gait Ambulation/Gait assistance: Min assist Gait Distance (Feet): 2 Feet Assistive device: Rolling walker (2 wheeled) Gait Pattern/deviations: Step-to pattern;Shuffle     General Gait Details: 3 side steps at edge of bed, distance limited by fatigue  Stairs            Wheelchair Mobility    Modified Rankin (Stroke Patients Only)       Balance Overall balance assessment: Needs assistance Sitting-balance support: Feet supported;No upper extremity supported Sitting balance-Leahy Scale: Fair                                       Pertinent Vitals/Pain Pain Assessment: Faces Faces Pain Scale: Hurts even more Pain Location: right leg with repositioning in bed, abated after repositioned Pain Descriptors / Indicators: Grimacing;Guarding Pain Intervention(s): Limited activity within patient's tolerance;Monitored during session;Repositioned    Home Living Family/patient expects to be discharged to:: Skilled nursing facility                 Additional Comments: memory care unit at [facility]    Prior Function Level of Independence: Independent         Comments: walked without AD     Hand Dominance        Extremity/Trunk Assessment   Upper Extremity Assessment Upper Extremity Assessment: Generalized weakness    Lower Extremity Assessment Lower Extremity Assessment: Generalized weakness;RLE deficits/detail  RLE Deficits / Details: pt c/o pain with heel slide to ~40* knee flexion, pt unable to verbalize nor to point to location of pain when asked to do so RLE Sensation: WNL RLE Coordination: WNL    Cervical / Trunk Assessment Cervical / Trunk Assessment: Kyphotic  Communication   Communication: No difficulties  Cognition Arousal/Alertness: Lethargic Behavior During Therapy: Flat affect Overall Cognitive  Status: History of cognitive impairments - at baseline                                 General Comments: oriented to self only, eyes closed most of session, did follow commands with increased time      General Comments      Exercises     Assessment/Plan    PT Assessment Patient needs continued PT services  PT Problem List Decreased strength;Decreased mobility;Decreased activity tolerance;Decreased balance;Decreased knowledge of use of DME;Pain       PT Treatment Interventions Therapeutic activities;Therapeutic exercise;Functional mobility training;Patient/family education;DME instruction;Gait training    PT Goals (Current goals can be found in the Care Plan section)  Acute Rehab PT Goals Patient Stated Goal: to walk PT Goal Formulation: With family Time For Goal Achievement: 05/22/20 Potential to Achieve Goals: Fair    Frequency Min 2X/week   Barriers to discharge        Co-evaluation               AM-PAC PT "6 Clicks" Mobility  Outcome Measure Help needed turning from your back to your side while in a flat bed without using bedrails?: Total Help needed moving from lying on your back to sitting on the side of a flat bed without using bedrails?: Total Help needed moving to and from a bed to a chair (including a wheelchair)?: Total Help needed standing up from a chair using your arms (e.g., wheelchair or bedside chair)?: Total Help needed to walk in hospital room?: Total Help needed climbing 3-5 steps with a railing? : Total 6 Click Score: 6    End of Session Equipment Utilized During Treatment: Gait belt Activity Tolerance: Patient limited by lethargy;Patient limited by pain Patient left: in bed;with call bell/phone within reach;with bed alarm set;with family/visitor present Nurse Communication: Mobility status PT Visit Diagnosis: Difficulty in walking, not elsewhere classified (R26.2);History of falling (Z91.81)    Time: 1093-2355 PT Time  Calculation (min) (ACUTE ONLY): 28 min   Charges:   PT Evaluation $PT Eval Moderate Complexity: 1 Mod PT Treatments $Therapeutic Activity: 8-22 mins        Ralene Bathe Kistler PT 05/08/2020  Acute Rehabilitation Services Pager [phone] Office [phone]

## 2020-05-08 NOTE — TOC Initial Note (Signed)
Transition of Care [facility]) - Initial/Assessment Note    Patient Details  Name: Sarah Larsen MRN: [id number] Date of Birth: Sep 02, 1932  Transition of [facility]) CM/SW Contact:    Lennart Pall, LCSW Phone Number: 05/08/2020, 10:01 AM  Clinical Narrative:                 Met yesterday with pt and her POA, Arnoldo Morale.  Able to confirm that pt is a resident at [facility] in the SNF level of care and dc plan is to return when medically ready.  Have spoken with admissions coordinator at [facility] as well and will keep all posted on dc readiness.    Expected Discharge Plan: Skilled Nursing Facility Barriers to Discharge: Continued Medical Work up   Patient Goals and CMS Choice Patient states their goals for this hospitalization and ongoing recovery are:: return to SNF      Expected Discharge Plan and Services Expected Discharge Plan: [place] In-house Referral: Clinical Social Work   Post Acute Care Choice: [place] Living arrangements for the past 2 months: [place]                 DME Arranged: N/A DME Agency: NA                  Prior Living Arrangements/Services Living arrangements for the past 2 months: [place] Lives with:: Facility Resident Patient language and need for interpreter reviewed:: Yes Do you feel safe going back to the place where you live?: Yes      Need for Family Participation in Patient Care: No (Comment) Care giver support system in place?: Yes (comment)   Criminal Activity/Legal Involvement Pertinent to Current Situation/Hospitalization: No - Comment as needed  Activities of Daily Living Home Assistive Devices/Equipment: Blood pressure cuff,Hospital bed,Grab bars around toilet,Grab bars in shower,Hand-held shower hose,Scales,Walker (specify type),Wheelchair,CBG Meter ADL Screening (condition at time of admission) Patient's cognitive ability adequate to safely complete  daily activities?: No Is the patient deaf or have difficulty hearing?: Yes Does the patient have difficulty seeing, even when wearing glasses/contacts?: Yes Does the patient have difficulty concentrating, remembering, or making decisions?: Yes Patient able to express need for assistance with ADLs?: Yes Does the patient have difficulty dressing or bathing?: Yes Independently performs ADLs?: No Communication: Independent Dressing (OT): Dependent Is this a change from baseline?: Pre-admission baseline Grooming: Needs assistance,Dependent Is this a change from baseline?: Pre-admission baseline Feeding: Dependent Is this a change from baseline?: Pre-admission baseline Bathing: Needs assistance Is this a change from baseline?: Pre-admission baseline Toileting: Dependent Is this a change from baseline?: Pre-admission baseline In/Out Bed: Needs assistance,Dependent Is this a change from baseline?: Pre-admission baseline Walks in Home: Dependent Is this a change from baseline?: Pre-admission baseline Does the patient have difficulty walking or climbing stairs?: Yes (secondary to weakness) Weakness of Legs: Both Weakness of Arms/Hands: None  Permission Sought/Granted Permission sought to share information with : Other (comment) Permission granted to share information with : Yes, Verbal Permission Granted  Share Information with NAME: Arnoldo Morale     Permission granted to share info w Relationship: POA  Permission granted to share info w Contact Information: [phone]  Emotional Assessment Appearance:: Appears stated age Attitude/Demeanor/Rapport: Sedated Affect (typically observed): Accepting Orientation: : Oriented to Self Alcohol / Substance Use: Not Applicable Psych Involvement: No (comment)  Admission diagnosis:  Generalized abdominal pain [R10.84] Fever [R50.9] Intraabdominal fluid collection [R18.8] Fever, unspecified fever cause [R50.9] Patient  Active Problem List    Diagnosis Date Noted  . Intraabdominal fluid collection 05/07/2020  . Fever 05/06/2020  . Acute renal injury ([place]) 05/03/2020  . Generalized weakness 05/03/2020  . UTI (urinary tract infection) 05/03/2020  . Elevated TSH 03/28/2020  . Weight gain 03/22/2020  . Hyponatremia 01/18/2020  . Depression with anxiety 01/05/2020  . Fall 12/28/2019  . Dry eyes 11/09/2019  . Dizziness 08/24/2019  . Agitation 08/24/2019  . Vascular dementia ([place]) 01/15/2019  . Hyperlipidemia 01/15/2019  . Vitamin D insufficiency 01/15/2019  . Unsteady gait 01/15/2019   PCP:  [name], MD Pharmacy:   [place], [place] [facility] [place] [address] [place] [address] Phone: [phone] Fax: [phone]     Social Determinants of Health (SDOH) Interventions    Readmission Risk Interventions Readmission Risk Prevention Plan 05/07/2020  Post Dischage Appt Complete  Medication Screening Complete  Transportation Screening Complete

## 2020-05-09 LAB — BASIC METABOLIC PANEL
Anion gap: 10 (ref 5–15)
BUN: 15 mg/dL (ref 8–23)
CO2: 24 mmol/L (ref 22–32)
Calcium: 8.5 mg/dL — ABNORMAL LOW (ref 8.9–10.3)
Chloride: 105 mmol/L (ref 98–111)
Creatinine, Ser: 0.93 mg/dL (ref 0.44–1.00)
GFR, Estimated: >60 mL/min (ref >60)
Glucose, Bld: 83 mg/dL (ref 70–99)
Potassium: 3.8 mmol/L (ref 3.5–5.1)
Sodium: 139 mmol/L (ref 135–145)

## 2020-05-09 LAB — GLUCOSE, CAPILLARY
Glucose-Capillary: 63 mg/dL — ABNORMAL LOW (ref 70–99)
Glucose-Capillary: 76 mg/dL (ref 70–99)
Glucose-Capillary: 84 mg/dL (ref 70–99)
Glucose-Capillary: 90 mg/dL (ref 70–99)
Glucose-Capillary: 92 mg/dL (ref 70–99)

## 2020-05-09 NOTE — Evaluation (Signed)
Occupational Therapy Evaluation Patient Details Name: Sarah Larsen MRN: [id number] DOB: 04/06/1933 Today's Date: 05/09/2020    History of Present Illness 86 year old female admitted 05/06/20 with fever, found to have suprahepatic abscess, s/p drain placement 05/07/20. PMH of dementia. Pt is from [facility].   Clinical Impression   Mrs. Sarah Larsen is an 86 year old woman who typically ambulates without a device at her memory care unit who presents today in bed with RUQ drain. On evaluation patient presents with generalized weakness, decreased activity tolerance, impaired balance and baseline confusion resulting in a decline in her ability to ambulate and participate in self care tasks. Patient max assist to transfer to side of bed, mod assist to stand and transfer to recliner with RW, setup-to mod assist for UB ADLs, and total assist for toileting and LB dressing. Patient will benefit from skilled OT services while in hospital to improve deficits and learn compensatory strategies as needed in order to improve functional abilities.    Follow Up Recommendations  SNF    Equipment Recommendations  None recommended by OT    Recommendations for Other Services       Precautions / Restrictions Precautions Precautions: Fall Precaution Comments: pt's cousin Dorothyann Gibbs reports pt had several falls recently Restrictions Weight Bearing Restrictions: No      Mobility Bed Mobility Overal bed mobility: Needs Assistance Bed Mobility: Supine to Sit     Supine to sit: Max assist     General bed mobility comments: Max assist to transfer to side of bed - needing assistance for LEs, trunk lift off and pivot to edge of bed via use of pad.    Transfers Overall transfer level: Needs assistance Equipment used: Rolling walker (2 wheeled) Transfers: Sit to/from [employer] Sit to Stand: From elevated surface;Mod assist Stand pivot transfers: Mod assist       General transfer  comment: VCs hand placement, assist to power up from bed - needing increased height from bed. Initial mild posterior lean - that therapist reduced with walker placement and tactile cues. Patient mod assist to take steps to recliner with use of verbal and tactile cues to facilitate movement. Patient fearful initially - but able to take slow shuffling steps.    Balance Overall balance assessment: Needs assistance Sitting-balance support: No upper extremity supported;Feet supported Sitting balance-Leahy Scale: Fair     Standing balance support: Bilateral upper extremity supported;During functional activity Standing balance-Leahy Scale: Poor                             ADL either performed or assessed with clinical judgement   ADL Overall ADL's : Needs assistance/impaired Eating/Feeding: Set up   Grooming: Set up;Wash/dry face Grooming Details (indicate cue type and reason): washed face seated in recliner. Upper Body Bathing: Set up;Cueing for sequencing;Sitting   Lower Body Bathing: Maximal assistance;Sit to/from stand;Cueing for sequencing   Upper Body Dressing : Moderate assistance;Set up;Sitting   Lower Body Dressing: Total assistance;Sit to/from stand   Toilet Transfer: RW;BSC;Stand-pivot;Moderate assistance   Toileting- Clothing Manipulation and Hygiene: Total assistance;Sit to/from stand               Vision   Vision Assessment?: No apparent visual deficits     Perception     Praxis      Pertinent Vitals/Pain Pain Assessment: No/denies pain     Hand Dominance     Extremity/Trunk Assessment Upper Extremity Assessment Upper  Extremity Assessment: Generalized weakness   Lower Extremity Assessment Lower Extremity Assessment: Defer to PT evaluation   Cervical / Trunk Assessment Cervical / Trunk Assessment: Kyphotic   Communication Communication Communication: No difficulties   Cognition Arousal/Alertness: Awake/alert Behavior During Therapy:  Flat affect Overall Cognitive Status: History of cognitive impairments - at baseline                                 General Comments: oriented to self only, eyes closed most of session, did follow commands with increased time and encouragement   General Comments       Exercises     Shoulder Instructions      Home Living Family/patient expects to be discharged to:: Skilled nursing facility                                 Additional Comments: memory care unit at [facility]      Prior Functioning/Environment Level of Independence: Independent;Needs assistance  Gait / Transfers Assistance Needed: walked without DME ADL's / Homemaking Assistance Needed: Unreliable historian - unknown PLOF in regards to ADLs. Assume she had some assistance.            OT Problem List: Decreased strength;Decreased activity tolerance;Impaired balance (sitting and/or standing);Decreased cognition;Decreased safety awareness;Decreased knowledge of use of DME or AE      OT Treatment/Interventions: Self-care/ADL training;Therapeutic exercise;DME and/or AE instruction;Therapeutic activities;Balance training;Patient/family education    OT Goals(Current goals can be found in the care plan section) Acute Rehab OT Goals Patient Stated Goal: to improve mobility OT Goal Formulation: With patient/family Time For Goal Achievement: 05/23/20 Potential to Achieve Goals: Fair  OT Frequency: Min 2X/week   Barriers to D/C:            Co-evaluation              AM-PAC OT "6 Clicks" Daily Activity     Outcome Measure Help from another person eating meals?: A Little Help from another person taking care of personal grooming?: A Little Help from another person toileting, which includes using toliet, bedpan, or urinal?: Total Help from another person bathing (including washing, rinsing, drying)?: A Lot Help from another person to put on and taking off regular upper body  clothing?: A Lot Help from another person to put on and taking off regular lower body clothing?: Total 6 Click Score: 12   End of Session Equipment Utilized During Treatment: Gait belt;Rolling walker Nurse Communication: Mobility status  Activity Tolerance: Patient limited by fatigue Patient left: in chair;with call bell/phone within reach;with chair alarm set  OT Visit Diagnosis: Unsteadiness on feet (R26.81);Other abnormalities of gait and mobility (R26.89);Muscle weakness (generalized) (M62.81)                Time: 9326-7124 OT Time Calculation (min): 19 min Charges:  OT General Charges $OT Visit: 1 Visit OT Evaluation $OT Eval Moderate Complexity: 1 Mod  Nathania Waldman, OTR/L Acute Care Rehab Services  Office [phone] Pager: [phone]   Kelli Churn 05/09/2020, 2:28 PM

## 2020-05-09 NOTE — Progress Notes (Signed)
PROGRESS NOTE    Sarah Larsen  LGX:[id number] DOB: [birth year] DOA: 05/06/2020 PCP: [name], MD   Brief Narrative:  This 86 years old female , a retired Psychologist, counselling with PMH of dementia, liver abscess, who was sent to the emergency department for the evaluation of fever from memory care unit.  She was recently diagnosed with UTI and was on antibiotics.  She continues to remain febrile and has chills at home and with some abdominal discomfort.  She was febrile with elevated liver enzymes in the ED.  CT abdomen pelvis showed 10.7 cm fluid collection around the dome of liver.  She is started on antibiotics,  IR was consulted.  She underwent abscess aspiration.  She has minimal pinkish output in the drain.  Assessment & Plan:   Active Problems:   Vascular dementia (HCC)   Hyperlipidemia   Fever   Intraabdominal fluid collection  Liver abscess:  She presented with abdominal discomfort and fever. CT abdomen/pelvis showed 10.7 cm fluid collection around the dome of liver..  IR consulted and she underwent  liver abscess aspiration on 05/07/20.  Status post drain placement. Awaiting body fluid culture,   blood cultures : No growth so far. As per the report, she had liver abscess in the past.  Continue Zosyn for now.  Most likely she will be discharged with drain when she is ready.   Most likely she can be discharged on Augmentin for total of 4 weeks course, but we will follow up culture report before deciding antibiotic regimen.  Elevated liver enzymes:  Most likely secondary to fluid collection of the liver.  Improving.  History of dementia:  Continue supportive care.  She is on Depakote, Klonopin, Namenda and Seroquel at home.   She is confused at baseline. She has high risk for delirium.   Will keep the room bright with the sunlight, frequent orientation.  AKI: Improved with IV fluids.  Hypokalemia: Supplemented with potassium.  Normocytic anemia: Chronic.  Hemoglobin  currently stable.  Debility/deconditioning: She lives in a memory care. PT/OT evaluation  Thrombocytopenia: Mild.  Continue to monitor  DVT prophylaxis: SCDs Code Status: Full code. Family Communication:  No family at bed side. Disposition Plan:   Status is: Inpatient  Remains inpatient appropriate because:Inpatient level of care appropriate due to severity of illness   Dispo: The patient is from: Home              Anticipated d/c is to: Home              Anticipated d/c date is: 2 days              Patient currently is not medically stable to d/c.   Consultants:   Interventional radiology  Procedures: Liver abscess aspiration and drainage by IR Antimicrobials:  Anti-infectives (From admission, onward)   Start     Dose/Rate Route Frequency Ordered Stop   05/07/20 0800  piperacillin-tazobactam (ZOSYN) IVPB 3.375 g        3.375 g 12.5 mL/hr over 240 Minutes Intravenous Every 8 hours 05/07/20 0051     05/06/20 2315  piperacillin-tazobactam (ZOSYN) IVPB 3.375 g        3.375 g 100 mL/hr over 30 Minutes Intravenous  Once 05/06/20 2302 05/07/20 0015      Subjective: Patient was seen and examined at bedside.  Overnight events noted.  She reports mild abdominal pain.  She has a drain on the right side with slight pinkish output noted.  Objective: Vitals:  05/08/20 1342 05/08/20 2129 05/09/20 0616 05/09/20 0800  BP: (!) 130/55 123/69 133/64   Pulse: 78 73 66   Resp: 18 16 16    Temp: 98.2 F (36.8 C) 98.1 F (36.7 C) (!) 97.5 F (36.4 C)   TempSrc: Oral  Oral   SpO2: 96% 93% 96% 95%  Weight:      Height:        Intake/Output Summary (Last 24 hours) at 05/09/2020 1411 Last data filed at 05/09/2020 1000 Gross per 24 hour  Intake 243.4 ml  Output 1280 ml  Net -1036.6 ml   Filed Weights   05/08/20 0730  Weight: 73.2 kg    Examination:  General exam: Appears calm and comfortable, not in any acute distress. Respiratory system: Clear to auscultation.  Respiratory effort normal. Cardiovascular system: S1 & S2 heard, RRR. No JVD, murmurs, rubs, gallops or clicks. No pedal edema. Gastrointestinal system: Abdomen is nondistended, soft , mild tenderness noted.. No organomegaly or masses felt.  Normal bowel sounds heard. Central nervous system: Alert and oriented x 1. No focal neurological deficits. Extremities: No edema, no cyanosis, no clubbing. Skin: No rashes, lesions or ulcers Psychiatry: Judgement and insight appear normal. Mood & affect appropriate.     Data Reviewed: I have personally reviewed following labs and imaging studies  CBC: Recent Labs  Lab 05/02/20 2225 05/06/20 1937 05/08/20 0704  WBC 7.3 6.9 6.0  NEUTROABS 6.5 5.3  --   HGB 11.4* 11.3* 10.5*  HCT 36.1 35.3* 32.4*  MCV 93.0 93.1 93.1  PLT 132* 117* 138*   Basic Metabolic Panel: Recent Labs  Lab 05/02/20 2225 05/06/20 1937 05/08/20 0446 05/09/20 0539  NA 134* 136 140 139  K 3.6 3.6 3.2* 3.8  CL 98 103 106 105  CO2 28 23 24 24   GLUCOSE 118* 131* 98 83  BUN 27* 17 12 15   CREATININE 1.66* 1.12* 0.93 0.93  CALCIUM 9.4 8.3* 8.2* 8.5*   GFR: Estimated Creatinine Clearance: 45.3 mL/min (by C-G formula based on SCr of 0.93 mg/dL). Liver Function Tests: Recent Labs  Lab 05/06/20 1937 05/08/20 0446  AST 66* 30  ALT 108* 64*  ALKPHOS 183* 152*  BILITOT 0.5 0.6  PROT 6.2* 5.7*  ALBUMIN 2.7* 2.4*   No results for input(s): LIPASE, AMYLASE in the last 168 hours. No results for input(s): AMMONIA in the last 168 hours. Coagulation Profile: Recent Labs  Lab 05/06/20 1937  INR 1.2   Cardiac Enzymes: No results for input(s): CKTOTAL, CKMB, CKMBINDEX, TROPONINI in the last 168 hours. BNP (last 3 results) No results for input(s): PROBNP in the last 8760 hours. HbA1C: No results for input(s): HGBA1C in the last 72 hours. CBG: Recent Labs  Lab 05/08/20 1737 05/09/20 0002 05/09/20 0613 05/09/20 0647 05/09/20 1141  GLUCAP 79 92 63* 76 90   Lipid  Profile: No results for input(s): CHOL, HDL, LDLCALC, TRIG, CHOLHDL, LDLDIRECT in the last 72 hours. Thyroid Function Tests: No results for input(s): TSH, T4TOTAL, FREET4, T3FREE, THYROIDAB in the last 72 hours. Anemia Panel: No results for input(s): VITAMINB12, FOLATE, FERRITIN, TIBC, IRON, RETICCTPCT in the last 72 hours. Sepsis Labs: Recent Labs  Lab 05/03/20 0209 05/06/20 1937  LATICACIDVEN 1.3 1.5    Recent Results (from the past 240 hour(s))  Blood culture (routine x 2)     Status: None   Collection Time: 05/03/20  2:00 AM   Specimen: BLOOD  Result Value Ref Range Status   Specimen Description BLOOD RIGHT ANTECUBITAL  Final  Special Requests   Final    BOTTLES DRAWN AEROBIC AND ANAEROBIC Blood Culture adequate volume   Culture   Final    NO GROWTH 5 DAYS Performed at [facility], [address], [place], [place] [address]    Report Status 05/08/2020 FINAL  Final  Blood culture (routine x 2)     Status: None   Collection Time: 05/03/20  3:00 AM   Specimen: BLOOD RIGHT HAND  Result Value Ref Range Status   Specimen Description BLOOD RIGHT HAND  Final   Special Requests   Final    BOTTLES DRAWN AEROBIC AND ANAEROBIC Blood Culture results may not be optimal due to an inadequate volume of blood received in culture bottles   Culture   Final    NO GROWTH 5 DAYS Performed at [facility], [address] [place], [place] [address]    Report Status 05/08/2020 FINAL  Final  Urine culture     Status: Abnormal   Collection Time: 05/03/20  3:26 AM   Specimen: Urine, Random  Result Value Ref Range Status   Specimen Description URINE, RANDOM  Final   Special Requests NONE  Final   Culture (A)  Final    <10,000 COLONIES/mL INSIGNIFICANT GROWTH Performed at [facility], [address], [place], [place] [address]    Report Status 05/04/2020 FINAL  Final  Urine culture     Status: None   Collection Time: 05/06/20  7:37 PM   Specimen: Urine, Catheterized   Result Value Ref Range Status   Specimen Description   Final    URINE, CATHETERIZED Performed at [facility], [address], [place], [place] [address]    Special Requests   Final    NONE Performed at [facility], [address], [place], [place] [address]    Culture   Final    NO GROWTH Performed at [facility], [address] [place], [place] [address]    Report Status 05/07/2020 FINAL  Final  Blood Culture (routine x 2)     Status: None (Preliminary result)   Collection Time: 05/06/20  7:37 PM   Specimen: BLOOD LEFT FOREARM  Result Value Ref Range Status   Specimen Description   Final    BLOOD LEFT FOREARM Performed at [facility], [address] [place], [place] [address]    Special Requests   Final    BOTTLES DRAWN AEROBIC AND ANAEROBIC Blood Culture adequate volume Performed at [facility], [address] [place], [place] [address]    Culture   Final    NO GROWTH 2 DAYS Performed at [facility], [address] [place], [place] [address]    Report Status PENDING  Incomplete  Resp Panel by RT-PCR (Flu A&B, Covid) Nasopharyngeal Swab     Status: None   Collection Time: 05/06/20  7:37 PM   Specimen: Nasopharyngeal Swab; Nasopharyngeal(NP) swabs in vial transport medium  Result Value Ref Range Status   SARS Coronavirus 2 by RT PCR NEGATIVE NEGATIVE Final    Comment: (NOTE) SARS-CoV-2 target nucleic acids are NOT DETECTED.  The SARS-CoV-2 RNA is generally detectable in upper respiratory specimens during the acute phase of infection. The lowest concentration of SARS-CoV-2 viral copies this assay can detect is 138 copies/mL. A negative result does not preclude SARS-Cov-2 infection and should not be used as the sole basis for treatment or other patient management decisions. A  negative result may occur with  improper specimen collection/handling, submission of specimen other than  nasopharyngeal swab, presence of viral mutation(s) within the areas targeted by this assay, and inadequate number of viral copies(<138 copies/mL). A negative result must be combined with clinical observations, patient history, and epidemiological information. The expected result is Negative.  Fact Sheet for Patients:  BloggerCourse.com  Fact Sheet for Healthcare Providers:  SeriousBroker.it  This test is no t yet approved or cleared by the [place] FDA and  has been authorized for detection and/or diagnosis of SARS-CoV-2 by FDA under an Emergency Use Authorization (EUA). This EUA will remain  in effect (meaning this test can be used) for the duration of the COVID-19 declaration under Section 564(b)(1) of the Act, 21 U.S.C.section 360bbb-3(b)(1), unless the authorization is terminated  or revoked sooner.       Influenza A by PCR NEGATIVE NEGATIVE Final   Influenza B by PCR NEGATIVE NEGATIVE Final    Comment: (NOTE) The Xpert Xpress SARS-CoV-2/FLU/RSV plus assay is intended as an aid in the diagnosis of influenza from Nasopharyngeal swab specimens and should not be used as a sole basis for treatment. Nasal washings and aspirates are unacceptable for Xpert Xpress SARS-CoV-2/FLU/RSV testing.  Fact Sheet for Patients: BloggerCourse.com  Fact Sheet for Healthcare Providers: SeriousBroker.it  This test is not yet approved or cleared by the [place] FDA and has been authorized for detection and/or diagnosis of SARS-CoV-2 by FDA under an Emergency Use Authorization (EUA). This EUA will remain in effect (meaning this test can be used) for the duration of the COVID-19 declaration under Section 564(b)(1) of the Act, 21 U.S.C. section 360bbb-3(b)(1), unless the authorization is terminated or revoked.  Performed at [facility], [address], [place], [place] [address]   Aerobic/Anaerobic Culture (surgical/deep wound)     Status: None (Preliminary result)   Collection Time: 05/07/20  2:58 PM   Specimen: Abscess  Result Value Ref Range Status   Specimen Description   Final    ABSCESS Performed at [facility], [address] [place], [place] [address]    Special Requests   Final    Normal Performed at [facility], [address] [place], [place] [address]    Gram Stain   Final    ABUNDANT WBC PRESENT,BOTH PMN AND MONONUCLEAR NO ORGANISMS SEEN Performed at [facility], [address] [place], [place] [address]    Culture FEW PSEUDOMONAS AERUGINOSA  Final   Report Status PENDING  Incomplete     Radiology Studies: [place] IMAGE GUIDED FLUID DRAIN BY CATHETER  Result Date: 05/07/2020 INDICATION: Fluid collection superior to the dome of the liver and subdiaphragmatic in location. EXAM: ULTRASOUND-GUIDED PERCUTANEOUS CATHETER DRAINAGE PERIHEPATIC ABSCESS MEDICATIONS: No additional medications other than sedative medications documented below. ANESTHESIA/SEDATION: Fentanyl 25 mcg IV; Versed 0.5 mg IV Moderate Sedation Time:  12 minutes. The patient was continuously monitored during the procedure by the interventional radiology nurse under my direct supervision. COMPLICATIONS: None immediate. PROCEDURE: Informed written consent was obtained from the patient after a thorough discussion of the procedural risks, benefits and alternatives. All questions were addressed. Maximal Sterile Barrier Technique was utilized including caps, mask, sterile gowns, sterile gloves, sterile drape, hand hygiene and skin antiseptic. A timeout was performed prior to the initiation of the procedure. Ultrasound was used to localize a fluid collection superior to the dome of the liver. Under direct ultrasound guidance, and following infiltration of 1% lidocaine for  local anesthesia, a 5 [place] Yueh centesis catheter  was inserted over a 19 gauge needle into the fluid collection from a lateral approach. Aspiration of fluid was performed. A 20 mL sample was collected and sent for culture analysis. The Yueh catheter was removed over a guidewire. The tract was dilated to 10 [place]. A 10 French percutaneous drainage catheter was then advanced into the collection. Catheter position was confirmed by ultrasound. The catheter was flushed with saline and connected to a suction bulb. It was secured at the skin with a Prolene retention suture and StatLock device. FINDINGS: Aspiration at the level of the lentiform shaped suprahepatic fluid collection yielded grossly purulent fluid. Due to nature of fluid, a percutaneous drainage catheter was placed. There is good return of fluid from the drain after placement and attachment to suction bulb drainage. IMPRESSION: Ultrasound-guided aspiration of the right-sided suprahepatic/infradiaphragmatic fluid collection yielded grossly purulent fluid. A sample was sent for culture analysis. A 10 French percutaneous drainage catheter was placed and attached to suction bulb drainage. Electronically Signed   By: Irish Lack M.D.   On: 05/07/2020 15:51   Scheduled Meds: . clonazePAM  0.5 mg Oral QHS  . divalproex  250 mg Oral TID  . memantine  10 mg Oral BID  . QUEtiapine  12.5 mg Oral Daily  . QUEtiapine  25 mg Oral QHS  . sodium chloride flush  5 mL Intracatheter Q8H   Continuous Infusions: . sodium chloride 10 mL/hr at 05/09/20 0800  . piperacillin-tazobactam (ZOSYN)  IV 3.375 g (05/09/20 0741)     LOS: 3 days    Time spent: 25 mins    Kaycee Haycraft, MD [facility]   If 7PM-7AM, please contact night-coverage

## 2020-05-09 NOTE — Progress Notes (Addendum)
Referring Physician(s): Dr. Toniann Fail  Supervising Physician: Irish Lack  Patient Status:  [facility] - In-pt  Chief Complaint: Right upper quadrant fluid collection/suprahepatic abscess; s/p drain placement 05/07/20  Subjective: Patient sitting up in the chair; she just finished working with PT/OT. She denies significant pain or discomfort.   Allergies: Fish-derived products and Pravachol [pravastatin sodium]  Medications: Prior to Admission medications   Medication Sig Start Date End Date Taking? Authorizing Provider  CALCIUM-VITAMIN D PO Take 1 tablet by mouth in the morning and at bedtime. 600mg  (1,500mg ) - 400 unit   Yes [provider]  clonazePAM (KLONOPIN) 0.5 MG tablet Take 0.5 mg by mouth at bedtime.   Yes [provider]  divalproex (DEPAKOTE SPRINKLE) 125 MG capsule Take 250 mg by mouth 2 (two) times daily.   Yes [provider]  donepezil (ARICEPT) 10 MG tablet Take 10 mg by mouth at bedtime.    Yes [provider]  memantine (NAMENDA) 10 MG tablet Take 10 mg by mouth 2 (two) times daily.   Yes [provider]  nitrofurantoin, macrocrystal-monohydrate, (MACROBID) 100 MG capsule Take 100 mg by mouth 2 (two) times daily. 05/02/20  Yes [provider]  POLYVINYL ALCOHOL-POVIDONE OP Place 2 drops into both eyes 3 (three) times daily. drops; 0.5-0.6 %; amt: 2 drops each eye; ophthalmic (eye)   Yes [provider]  QUEtiapine (SEROQUEL) 25 MG tablet Take 12.5 mg by mouth daily.    Yes [provider]  saccharomyces boulardii (FLORASTOR) 250 MG capsule Take 250 mg by mouth 2 (two) times daily. Pt is taking while on antibiotic   Yes [provider]  triamcinolone cream (KENALOG) 0.1 % Apply 1 application topically 2 (two) times daily as needed (rash on ankles). Apply to rash on ankles twice a day until healed.   Yes [provider]  Acetaminophen 325 MG CAPS Take 650 mg by mouth every 4 (four)  hours as needed for fever. For fever over 100.6 for 48 hours. Do not exceed 3,000mg  in 24 hours    [provider]  QUEtiapine (SEROQUEL) 25 MG tablet Take 25 mg by mouth at bedtime.    [provider]     Vital Signs: BP 133/64 (BP Location: Right Arm)   Pulse 66   Temp (!) 97.5 F (36.4 C) (Oral)   Resp 16   Ht 5\' 6"  (1.676 m)   Wt 161 lb 6.4 oz (73.2 kg)   SpO2 95%   BMI 26.05 kg/m   Physical Exam Constitutional:      General: She is not in acute distress.    Comments: Responds briefly/minimally to questions. She kept her eyes closed for most of the assessment.   Pulmonary:     Effort: Pulmonary effort is normal.  Abdominal:     Tenderness: There is no abdominal tenderness.     Comments: RUQ drain with pale, thin, purulent fluid in catheter/bulb. Drain easily flushed with NS. Approximately 15 ml in bulb.   Skin:    General: Skin is warm and dry.  Neurological:     Comments: Alert to person, possible alert to place/situation. Unable to fully assess.      Imaging: CT ABDOMEN PELVIS W CONTRAST  Result Date: 05/06/2020 CLINICAL DATA:  Abdominal pain EXAM: CT ABDOMEN AND PELVIS WITH CONTRAST TECHNIQUE: Multidetector CT imaging of the abdomen and pelvis was performed using the standard protocol following bolus administration of intravenous contrast. CONTRAST:  OMNIPAQUE IOHEXOL 300 MG/ML  SOLN COMPARISON:  None. FINDINGS: Lower chest: Small bilateral pleural effusions. Bibasilar atelectasis. Hepatobiliary: No focal hepatic abnormality. There is a fluid collection noted along the dome of the liver. This appears to be subdiaphragmatic on the sagittal images. This may reflect a subcapsular fluid collection. This measures 10.7 x 7.6 x 4.8 cm. This also extends along the lateral surface of the upper liver. Prior cholecystectomy. Pancreas: No focal abnormality or ductal dilatation. Spleen: No focal abnormality.  Normal size. Adrenals/Urinary Tract: No adrenal  abnormality. No focal renal abnormality. No stones or hydronephrosis. Urinary bladder is unremarkable. Stomach/Bowel: Postoperative changes in the sigmoid colon. Colonic diverticulosis. No active diverticulitis. Stomach and small bowel decompressed. Vascular/Lymphatic: Aortoiliac atherosclerosis. No evidence of aneurysm or adenopathy. Reproductive: Uterus and adnexa unremarkable.  No mass. Other: No free fluid or free air. Small bilateral inguinal hernias containing fat. Musculoskeletal: No acute bony abnormality. IMPRESSION: Large fluid collection along the dome of the liver, likely subcapsular fluid collection of unknown etiology. This measures up to 10.7 cm. Colonic diverticulosis.  No active diverticulitis. Small bilateral pleural effusions.  Bibasilar atelectasis. Aortoiliac atherosclerosis. Bilateral inguinal hernias containing fat. Electronically Signed   By: Charlett Nose M.D.   On: 05/06/2020 22:37   DG Chest Port 1 View  Result Date: 05/06/2020 CLINICAL DATA:  Questionable sepsis EXAM: PORTABLE CHEST 1 VIEW COMPARISON:  None. FINDINGS: Heart is normal size. Bibasilar atelectasis. No effusions. No acute bony abnormality. IMPRESSION: Bibasilar atelectasis. Electronically Signed   By: Charlett Nose M.D.   On: 05/06/2020 19:41   [place] IMAGE GUIDED FLUID DRAIN BY CATHETER  Result Date: 05/07/2020 INDICATION: Fluid collection superior to the dome of the liver and subdiaphragmatic in location. EXAM: ULTRASOUND-GUIDED PERCUTANEOUS CATHETER DRAINAGE PERIHEPATIC ABSCESS MEDICATIONS: No additional medications other than sedative medications documented below. ANESTHESIA/SEDATION: Fentanyl 25 mcg IV; Versed 0.5 mg IV Moderate Sedation Time:  12 minutes. The patient was continuously monitored during the procedure by the interventional radiology nurse under my direct supervision. COMPLICATIONS: None immediate. PROCEDURE: Informed written consent was obtained from the patient after a thorough discussion of the  procedural risks, benefits and alternatives. All questions were addressed. Maximal Sterile Barrier Technique was utilized including caps, mask, sterile gowns, sterile gloves, sterile drape, hand hygiene and skin antiseptic. A timeout was performed prior to the initiation of the procedure. Ultrasound was used to localize a fluid collection superior to the dome of the liver. Under direct ultrasound guidance, and following infiltration of 1% lidocaine for local anesthesia, a 5 [place] Yueh centesis catheter was inserted over a 19 gauge needle into the fluid collection from a lateral approach. Aspiration of fluid was performed. A 20 mL sample was collected and sent for culture analysis. The Yueh catheter was removed over a guidewire. The tract was dilated to 10 [place]. A 10 French percutaneous drainage catheter was then advanced into the collection. Catheter position was confirmed by ultrasound. The catheter was flushed with saline and connected to a suction bulb. It was secured at the skin with a Prolene retention suture and StatLock device. FINDINGS: Aspiration at the level of the lentiform shaped suprahepatic fluid collection yielded grossly purulent fluid. Due to nature of fluid, a percutaneous drainage catheter was placed. There is good return of fluid from the drain after placement and attachment to suction bulb drainage. IMPRESSION: Ultrasound-guided aspiration of the right-sided suprahepatic/infradiaphragmatic fluid collection yielded grossly purulent fluid. A sample was sent for culture analysis. A 10 French percutaneous drainage catheter was placed and attached to suction bulb drainage. Electronically  Signed   By: Irish Lack M.D.   On: 05/07/2020 15:51    Labs:  CBC: Recent Labs    03/28/20 0000 05/02/20 2225 05/06/20 1937 05/08/20 0704  WBC 5.4 7.3 6.9 6.0  HGB 13.2 11.4* 11.3* 10.5*  HCT 40 36.1 35.3* 32.4*  PLT 199 132* 117* 138*    COAGS: Recent Labs    05/06/20 1937  INR 1.2   APTT 36    BMP: Recent Labs    07/12/19 0000 09/15/19 0000 11/01/19 0000 12/27/19 0000 01/24/20 0000 03/28/20 0000 05/02/20 2225 05/06/20 1937 05/08/20 0446 05/09/20 0539  NA 141 140 140   < > 132*   < > 134* 136 140 139  K 4.4 4.0 4.8   < > 4.3   < > 3.6 3.6 3.2* 3.8  CL 104 100 103   < > 96*   < > 98 103 106 105  CO2 30* 30* 29*   < > 29*   < > 28 23 24 24   GLUCOSE  --   --   --   --   --   --  118* 131* 98 83  BUN 13 50* 15   < > 10   < > 27* 17 12 15   CALCIUM 9.1 9.3 9.3   < > 8.5*   < > 9.4 8.3* 8.2* 8.5*  CREATININE 1.0 1.2* 1.0   < > 0.9   < > 1.66* 1.12* 0.93 0.93  GFRNONAA 53 38 50  --  58  --  30* 48* >60 >60  GFRAA 61 44 58  --  68  --   --   --   --   --    < > = values in this interval not displayed.    LIVER FUNCTION TESTS: Recent Labs    01/24/20 0000 03/28/20 0000 05/06/20 1937 05/08/20 0446  BILITOT  --   --  0.5 0.6  AST 12* 12* 66* 30  ALT 8 6* 108* 64*  ALKPHOS 51 66 183* 152*  PROT  --   --  6.2* 5.7*  ALBUMIN 3.2* 3.7 2.7* 2.4*    Assessment and Plan:  Right upper quadrant fluid collection/suprahepatic abscess; s/p drain placement 05/07/20: Patient is afebrile, last WBC count normal. 30 ml of output documented in Epic with another 15 ml in bulb. Continue with current IR orders: flush drain/monitor output. Change dressing daily or as needed. Will consider re-imaging when output has been minimal for a few days.   Other plans per primary teams. IR will continue to follow. Please call IR with any questions.   Electronically Signed: 05/10/20, AGACNP-BC [phone] 05/09/2020, 2:25 PM   I spent a total of 15 Minutes at the the patient's bedside AND on the patient's hospital floor or unit, greater than 50% of which was counseling/coordinating care for right upper quadrant drain.

## 2020-05-09 NOTE — Care Management Important Message (Signed)
Important Message  Patient Details IM Letter given to the Patient. Name: Sarah Larsen MRN: [id number] Date of Birth: 10/26/1932   Medicare Important Message Given:  Yes     Caren Macadam 05/09/2020, 1:39 PM

## 2020-05-09 NOTE — Progress Notes (Signed)
Hypoglycemic Event  CBG: 63  Treatment: orange juice  Symptoms: Asymptomatic  Follow-up CBG: Time: 0635 CBG Result: 76  Possible Reasons for Event:  Low PO intake  Comments/MD notified: resolved    Clarnce Flock

## 2020-05-10 LAB — COMPREHENSIVE METABOLIC PANEL
ALT: 38 U/L (ref 0–44)
AST: 21 U/L (ref 15–41)
Albumin: 2.3 g/dL — ABNORMAL LOW (ref 3.5–5.0)
Alkaline Phosphatase: 141 U/L — ABNORMAL HIGH (ref 38–126)
Anion gap: 7 (ref 5–15)
BUN: 12 mg/dL (ref 8–23)
CO2: 30 mmol/L (ref 22–32)
Calcium: 8.5 mg/dL — ABNORMAL LOW (ref 8.9–10.3)
Chloride: 105 mmol/L (ref 98–111)
Creatinine, Ser: 1.06 mg/dL — ABNORMAL HIGH (ref 0.44–1.00)
GFR, Estimated: 51 mL/min — ABNORMAL LOW (ref >60)
Glucose, Bld: 88 mg/dL (ref 70–99)
Potassium: 3.6 mmol/L (ref 3.5–5.1)
Sodium: 142 mmol/L (ref 135–145)
Total Bilirubin: 0.6 mg/dL (ref 0.3–1.2)
Total Protein: 5.7 g/dL — ABNORMAL LOW (ref 6.5–8.1)

## 2020-05-10 LAB — CBC
HCT: 32.7 % — ABNORMAL LOW (ref 36.0–46.0)
Hemoglobin: 10.5 g/dL — ABNORMAL LOW (ref 12.0–15.0)
MCH: 29.6 pg (ref 26.0–34.0)
MCHC: 32.1 g/dL (ref 30.0–36.0)
MCV: 92.1 fL (ref 80.0–100.0)
Platelets: 228 10*3/uL (ref 150–400)
RBC: 3.55 MIL/uL — ABNORMAL LOW (ref 3.87–5.11)
RDW: 15.3 % (ref 11.5–15.5)
WBC: 6.4 10*3/uL (ref 4.0–10.5)
nRBC: 0 % (ref 0.0–0.2)

## 2020-05-10 LAB — GLUCOSE, CAPILLARY
Glucose-Capillary: 76 mg/dL (ref 70–99)
Glucose-Capillary: 80 mg/dL (ref 70–99)
Glucose-Capillary: 93 mg/dL (ref 70–99)

## 2020-05-10 LAB — PHOSPHORUS: Phosphorus: 3.4 mg/dL (ref 2.5–4.6)

## 2020-05-10 LAB — MAGNESIUM: Magnesium: 2 mg/dL (ref 1.7–2.4)

## 2020-05-10 NOTE — Progress Notes (Signed)
Referring Physician(s): Eduard [facility])  Supervising Physician: Richarda Overlie  Patient Status:  [facility] - In-pt  Chief Complaint: "Cold"  Subjective:  History of right-sided suprahepatic/infradiaphragmatic fluid collection concerning for abscess s/p RUQ drain placement in IR 05/07/2020. Patient awake and alert laying in bed. States she is cold today, no additional complaints. Appears she had a bowel movement in bed- RN notified. RUQ drain site c/d/i.   Allergies: Fish-derived products and Pravachol [pravastatin sodium]  Medications: Prior to Admission medications   Medication Sig Start Date End Date Taking? Authorizing Provider  CALCIUM-VITAMIN D PO Take 1 tablet by mouth in the morning and at bedtime. 600mg  (1,500mg ) - 400 unit   Yes [provider]  clonazePAM (KLONOPIN) 0.5 MG tablet Take 0.5 mg by mouth at bedtime.   Yes [provider]  divalproex (DEPAKOTE SPRINKLE) 125 MG capsule Take 250 mg by mouth 2 (two) times daily.   Yes [provider]  donepezil (ARICEPT) 10 MG tablet Take 10 mg by mouth at bedtime.    Yes [provider]  memantine (NAMENDA) 10 MG tablet Take 10 mg by mouth 2 (two) times daily.   Yes [provider]  nitrofurantoin, macrocrystal-monohydrate, (MACROBID) 100 MG capsule Take 100 mg by mouth 2 (two) times daily. 05/02/20  Yes [provider]  POLYVINYL ALCOHOL-POVIDONE OP Place 2 drops into both eyes 3 (three) times daily. drops; 0.5-0.6 %; amt: 2 drops each eye; ophthalmic (eye)   Yes [provider]  QUEtiapine (SEROQUEL) 25 MG tablet Take 12.5 mg by mouth daily.    Yes [provider]  saccharomyces boulardii (FLORASTOR) 250 MG capsule Take 250 mg by mouth 2 (two) times daily. Pt is taking while on antibiotic   Yes [provider]  triamcinolone cream (KENALOG) 0.1 % Apply 1 application topically 2 (two) times daily as needed (rash on ankles). Apply to rash on  ankles twice a day until healed.   Yes [provider]  Acetaminophen 325 MG CAPS Take 650 mg by mouth every 4 (four) hours as needed for fever. For fever over 100.6 for 48 hours. Do not exceed 3,000mg  in 24 hours    [provider]  QUEtiapine (SEROQUEL) 25 MG tablet Take 25 mg by mouth at bedtime.    [provider]     Vital Signs: BP 136/68 (BP Location: Left Arm)   Pulse 75   Temp 98.1 F (36.7 C)   Resp 17   Ht 5\' 6"  (1.676 m)   Wt 161 lb 6.4 oz (73.2 kg)   SpO2 95%   BMI 26.05 kg/m   Physical Exam Vitals and nursing note reviewed.  Constitutional:      General: She is not in acute distress.    Appearance: Normal appearance.  Pulmonary:     Effort: Pulmonary effort is normal. No respiratory distress.  Abdominal:     Comments: RUQ drain site without tenderness, erythema, drainage, or active bleeding; approximately 10-15 cc clear yellow fluid in suction bulb.  Skin:    General: Skin is warm and dry.  Neurological:     Mental Status: She is alert.     Imaging: CT ABDOMEN PELVIS W CONTRAST  Result Date: 05/06/2020 CLINICAL DATA:  Abdominal pain EXAM: CT ABDOMEN AND PELVIS WITH CONTRAST TECHNIQUE: Multidetector CT imaging of the abdomen and pelvis was performed using the standard protocol following bolus administration of intravenous contrast. CONTRAST:  OMNIPAQUE IOHEXOL 300 MG/ML  SOLN COMPARISON:  None.  FINDINGS: Lower chest: Small bilateral pleural effusions. Bibasilar atelectasis. Hepatobiliary: No focal hepatic abnormality. There is a fluid collection noted along the dome of the liver. This appears to be subdiaphragmatic on the sagittal images. This may reflect a subcapsular fluid collection. This measures 10.7 x 7.6 x 4.8 cm. This also extends along the lateral surface of the upper liver. Prior cholecystectomy. Pancreas: No focal abnormality or ductal dilatation. Spleen: No focal abnormality.  Normal size. Adrenals/Urinary Tract: No  adrenal abnormality. No focal renal abnormality. No stones or hydronephrosis. Urinary bladder is unremarkable. Stomach/Bowel: Postoperative changes in the sigmoid colon. Colonic diverticulosis. No active diverticulitis. Stomach and small bowel decompressed. Vascular/Lymphatic: Aortoiliac atherosclerosis. No evidence of aneurysm or adenopathy. Reproductive: Uterus and adnexa unremarkable.  No mass. Other: No free fluid or free air. Small bilateral inguinal hernias containing fat. Musculoskeletal: No acute bony abnormality. IMPRESSION: Large fluid collection along the dome of the liver, likely subcapsular fluid collection of unknown etiology. This measures up to 10.7 cm. Colonic diverticulosis.  No active diverticulitis. Small bilateral pleural effusions.  Bibasilar atelectasis. Aortoiliac atherosclerosis. Bilateral inguinal hernias containing fat. Electronically Signed   By: Charlett Nose M.D.   On: 05/06/2020 22:37   DG Chest Port 1 View  Result Date: 05/06/2020 CLINICAL DATA:  Questionable sepsis EXAM: PORTABLE CHEST 1 VIEW COMPARISON:  None. FINDINGS: Heart is normal size. Bibasilar atelectasis. No effusions. No acute bony abnormality. IMPRESSION: Bibasilar atelectasis. Electronically Signed   By: Charlett Nose M.D.   On: 05/06/2020 19:41   [place] IMAGE GUIDED FLUID DRAIN BY CATHETER  Result Date: 05/07/2020 INDICATION: Fluid collection superior to the dome of the liver and subdiaphragmatic in location. EXAM: ULTRASOUND-GUIDED PERCUTANEOUS CATHETER DRAINAGE PERIHEPATIC ABSCESS MEDICATIONS: No additional medications other than sedative medications documented below. ANESTHESIA/SEDATION: Fentanyl 25 mcg IV; Versed 0.5 mg IV Moderate Sedation Time:  12 minutes. The patient was continuously monitored during the procedure by the interventional radiology nurse under my direct supervision. COMPLICATIONS: None immediate. PROCEDURE: Informed written consent was obtained from the patient after a thorough discussion of  the procedural risks, benefits and alternatives. All questions were addressed. Maximal Sterile Barrier Technique was utilized including caps, mask, sterile gowns, sterile gloves, sterile drape, hand hygiene and skin antiseptic. A timeout was performed prior to the initiation of the procedure. Ultrasound was used to localize a fluid collection superior to the dome of the liver. Under direct ultrasound guidance, and following infiltration of 1% lidocaine for local anesthesia, a 5 [place] Yueh centesis catheter was inserted over a 19 gauge needle into the fluid collection from a lateral approach. Aspiration of fluid was performed. A 20 mL sample was collected and sent for culture analysis. The Yueh catheter was removed over a guidewire. The tract was dilated to 10 [place]. A 10 French percutaneous drainage catheter was then advanced into the collection. Catheter position was confirmed by ultrasound. The catheter was flushed with saline and connected to a suction bulb. It was secured at the skin with a Prolene retention suture and StatLock device. FINDINGS: Aspiration at the level of the lentiform shaped suprahepatic fluid collection yielded grossly purulent fluid. Due to nature of fluid, a percutaneous drainage catheter was placed. There is good return of fluid from the drain after placement and attachment to suction bulb drainage. IMPRESSION: Ultrasound-guided aspiration of the right-sided suprahepatic/infradiaphragmatic fluid collection yielded grossly purulent fluid. A sample was sent for culture analysis. A 10 French percutaneous drainage catheter was placed and attached to suction bulb drainage. Electronically Signed   By:  Irish Lack M.D.   On: 05/07/2020 15:51    Labs:  CBC: Recent Labs    05/02/20 2225 05/06/20 1937 05/08/20 0704 05/10/20 0511  WBC 7.3 6.9 6.0 6.4  HGB 11.4* 11.3* 10.5* 10.5*  HCT 36.1 35.3* 32.4* 32.7*  PLT 132* 117* 138* 228    COAGS: Recent Labs    05/06/20 1937  INR  1.2  APTT 36    BMP: Recent Labs    07/12/19 0000 09/15/19 0000 11/01/19 0000 12/27/19 0000 01/24/20 0000 03/28/20 0000 05/06/20 1937 05/08/20 0446 05/09/20 0539 05/10/20 0511  NA 141 140 140   < > 132*   < > 136 140 139 142  K 4.4 4.0 4.8   < > 4.3   < > 3.6 3.2* 3.8 3.6  CL 104 100 103   < > 96*   < > 103 106 105 105  CO2 30* 30* 29*   < > 29*   < > 23 24 24 30   GLUCOSE  --   --   --   --   --    < > 131* 98 83 88  BUN 13 50* 15   < > 10   < > 17 12 15 12   CALCIUM 9.1 9.3 9.3   < > 8.5*   < > 8.3* 8.2* 8.5* 8.5*  CREATININE 1.0 1.2* 1.0   < > 0.9   < > 1.12* 0.93 0.93 1.06*  GFRNONAA 53 38 50  --  58   < > 48* >60 >60 51*  GFRAA 61 44 58  --  68  --   --   --   --   --    < > = values in this interval not displayed.    LIVER FUNCTION TESTS: Recent Labs    03/28/20 0000 05/06/20 1937 05/08/20 0446 05/10/20 0511  BILITOT  --  0.5 0.6 0.6  AST 12* 66* 30 21  ALT 6* 108* 64* 38  ALKPHOS 66 183* 152* 141*  PROT  --  6.2* 5.7* 5.7*  ALBUMIN 3.7 2.7* 2.4* 2.3*    Assessment and Plan:  History of right-sided suprahepatic/infradiaphragmatic fluid collection concerning for abscess s/p RUQ drain placement in IR 05/07/2020. RUQ drain stable with approximately 10-15 cc clear yellow fluid in suction bulb (additional 28 cc output from drain in past 24 hours per chart). Continue current drain management- continue with Qshift flushes/monitor of output. Plan for repeat CT/possible drain injection when output <10 cc/day (assess for possible removal). Further plans per [facility]- appreciate and agree with management. IR to follow.   Electronically Signed: 05/09/2020, PA-C 05/10/2020, 11:40 AM   I spent a total of 25 Minutes at the the patient's bedside AND on the patient's hospital floor or unit, greater than 50% of which was counseling/coordinating care for RUQ fluid collection s/p RUQ drain placement.

## 2020-05-10 NOTE — Plan of Care (Signed)
°  Problem: Education: Goal: Required Educational Video(s) 05/10/2020 2345 by Pincus Badder, RN Outcome: Progressing 05/10/2020 2344 by Pincus Badder, RN Outcome: Progressing   Problem: Clinical Measurements: Goal: Postoperative complications will be avoided or minimized 05/10/2020 2345 by Pincus Badder, RN Outcome: Progressing 05/10/2020 2344 by Pincus Badder, RN Outcome: Progressing   Problem: Skin Integrity: Goal: Demonstration of wound healing without infection will improve 05/10/2020 2345 by Pincus Badder, RN Outcome: Progressing 05/10/2020 2344 by Pincus Badder, RN Outcome: Progressing

## 2020-05-10 NOTE — Progress Notes (Signed)
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  She was recently diagnosed with UTI and was on antibiotics.  She continues to remain febrile and has chills at home and with some abdominal discomfort.  She was febrile with elevated liver enzymes in the ED.  CT abdomen pelvis showed 10.7 cm fluid collection around the dome of liver.  She is started on antibiotics,  IR was consulted.  She underwent abscess aspiration.  She has minimal pinkish output in the drain.  Assessment & Plan:   Active Problems:   Vascular dementia (HCC)   Hyperlipidemia   Fever   Intraabdominal fluid collection  Liver abscess:  She presented with abdominal discomfort and fever. CT abdomen/pelvis showed 10.7 cm fluid collection around the dome of liver..  IR consulted and she underwent liver abscess aspiration on 05/07/20.  Status post drain placement.  blood cultures : No growth so far.  Fluid culture grew few Pseudomonas, continue Zosyn. As per the report, she had liver abscess in the past.  Continue Zosyn for now.  Most likely she will be discharged with drain when she is ready.   IR recommended repeat CT scan abdomen to ensure resolution of abscess. Will discuss with ID about antibiotic recommendation and duration.  Elevated liver enzymes:  Most likely secondary to fluid collection of the liver.  Improving.  History of dementia:  Continue supportive care.  She is on Depakote, Klonopin, Namenda and Seroquel at home.   She is confused at baseline. She has high risk for delirium.   Will keep the room bright with the sunlight, frequent orientation.  AKI: Improved with IV fluids.  Hypokalemia: Supplemented with potassium.  Normocytic anemia: Chronic.   Hemoglobin currently stable.  Debility/deconditioning: She lives in a memory care. PT/OT evaluation  Thrombocytopenia: Mild.  Continue to monitor  DVT prophylaxis: SCDs Code Status: Full code. Family Communication:  No family at bed side. Disposition Plan:   Status is: Inpatient  Remains inpatient appropriate because:Inpatient level of care appropriate due to severity of illness   Dispo: The patient is from: Home              Anticipated d/c is to: Home              Anticipated d/c date is: 2 days              Patient currently is not medically stable to d/c.   Consultants:   Interventional radiology  Procedures: Liver abscess aspiration and drainage by IR Antimicrobials:  Anti-infectives (From admission, onward)   Start     Dose/Rate Route Frequency Ordered Stop   05/07/20 0800  piperacillin-tazobactam (ZOSYN) IVPB 3.375 g        3.375 g 12.5 mL/hr over 240 Minutes Intravenous Every 8 hours 05/07/20 0051     05/06/20 2315  piperacillin-tazobactam (ZOSYN) IVPB 3.375 g        3.375 g 100 mL/hr over 30 Minutes Intravenous  Once 05/06/20 2302 05/07/20 0015      Subjective: Patient was seen and examined at bedside.  Overnight events noted.   She denies any abdominal pain.  She has a drain on the right side with slight pinkish output noted.  Objective: Vitals:  05/09/20 0800 05/09/20 1458 05/09/20 2130 05/10/20 0613  BP:  137/87 130/64 136/68  Pulse:  87 79 75  Resp:  16 17 17   Temp:  98 F (36.7 C) 98.4 F (36.9 C) 98.1 F (36.7 C)  TempSrc:  Oral    SpO2: 95% 100% 95% 95%  Weight:      Height:        Intake/Output Summary (Last 24 hours) at 05/10/2020 1315 Last data filed at 05/10/2020 1007 Gross per 24 hour  Intake 680 ml  Output 1478 ml  Net -798 ml   Filed Weights   05/08/20 0730  Weight: 73.2 kg    Examination:  General exam: Appears calm and comfortable, not in any acute distress. Respiratory system: Clear to auscultation. Respiratory  effort normal. Cardiovascular system: S1 & S2 heard, RRR. No JVD, murmurs, rubs, gallops or clicks. No pedal edema. Gastrointestinal system: Abdomen is nondistended, soft , mild tenderness noted.. No organomegaly or masses felt.  Normal bowel sounds heard.  Drain on the right side with some pinkish output. Central nervous system: Alert and oriented x 1. No focal neurological deficits. Extremities: No edema, no cyanosis, no clubbing. Skin: No rashes, lesions or ulcers Psychiatry: Judgement and insight appear normal. Mood & affect appropriate.     Data Reviewed: I have personally reviewed following labs and imaging studies  CBC: Recent Labs  Lab 05/06/20 1937 05/08/20 0704 05/10/20 0511  WBC 6.9 6.0 6.4  NEUTROABS 5.3  --   --   HGB 11.3* 10.5* 10.5*  HCT 35.3* 32.4* 32.7*  MCV 93.1 93.1 92.1  PLT 117* 138* 228   Basic Metabolic Panel: Recent Labs  Lab 05/06/20 1937 05/08/20 0446 05/09/20 0539 05/10/20 0511  NA 136 140 139 142  K 3.6 3.2* 3.8 3.6  CL 103 106 105 105  CO2 23 24 24 30   GLUCOSE 131* 98 83 88  BUN 17 12 15 12   CREATININE 1.12* 0.93 0.93 1.06*  CALCIUM 8.3* 8.2* 8.5* 8.5*  MG  --   --   --  2.0  PHOS  --   --   --  3.4   GFR: Estimated Creatinine Clearance: 39.8 mL/min (A) (by C-G formula based on SCr of 1.06 mg/dL (H)). Liver Function Tests: Recent Labs  Lab 05/06/20 1937 05/08/20 0446 05/10/20 0511  AST 66* 30 21  ALT 108* 64* 38  ALKPHOS 183* 152* 141*  BILITOT 0.5 0.6 0.6  PROT 6.2* 5.7* 5.7*  ALBUMIN 2.7* 2.4* 2.3*   No results for input(s): LIPASE, AMYLASE in the last 168 hours. No results for input(s): AMMONIA in the last 168 hours. Coagulation Profile: Recent Labs  Lab 05/06/20 1937  INR 1.2   Cardiac Enzymes: No results for input(s): CKTOTAL, CKMB, CKMBINDEX, TROPONINI in the last 168 hours. BNP (last 3 results) No results for input(s): PROBNP in the last 8760 hours. HbA1C: No results for input(s): HGBA1C in the last 72  hours. CBG: Recent Labs  Lab 05/09/20 1141 05/09/20 1745 05/10/20 0012 05/10/20 0615 05/10/20 1149  GLUCAP 90 84 93 76 80   Lipid Profile: No results for input(s): CHOL, HDL, LDLCALC, TRIG, CHOLHDL, LDLDIRECT in the last 72 hours. Thyroid Function Tests: No results for input(s): TSH, T4TOTAL, FREET4, T3FREE, THYROIDAB in the last 72 hours. Anemia Panel: No results for input(s): VITAMINB12, FOLATE, FERRITIN, TIBC, IRON, RETICCTPCT in the last 72 hours. Sepsis Labs: Recent Labs  Lab 05/06/20 1937  LATICACIDVEN 1.5    Recent Results (from the past  240 hour(s))  Blood culture (routine x 2)     Status: None   Collection Time: 05/03/20  2:00 AM   Specimen: BLOOD  Result Value Ref Range Status   Specimen Description BLOOD RIGHT ANTECUBITAL  Final   Special Requests   Final    BOTTLES DRAWN AEROBIC AND ANAEROBIC Blood Culture adequate volume   Culture   Final    NO GROWTH 5 DAYS Performed at [facility], [address], [place], [place] [address]    Report Status 05/08/2020 FINAL  Final  Blood culture (routine x 2)     Status: None   Collection Time: 05/03/20  3:00 AM   Specimen: BLOOD RIGHT HAND  Result Value Ref Range Status   Specimen Description BLOOD RIGHT HAND  Final   Special Requests   Final    BOTTLES DRAWN AEROBIC AND ANAEROBIC Blood Culture results may not be optimal due to an inadequate volume of blood received in culture bottles   Culture   Final    NO GROWTH 5 DAYS Performed at [facility], [address], [place], [place] [address]    Report Status 05/08/2020 FINAL  Final  Urine culture     Status: Abnormal   Collection Time: 05/03/20  3:26 AM   Specimen: Urine, Random  Result Value Ref Range Status   Specimen Description URINE, RANDOM  Final   Special Requests NONE  Final   Culture (A)  Final    <10,000 COLONIES/mL INSIGNIFICANT GROWTH Performed at [facility], [address], [place], [place] [address]    Report Status  05/04/2020 FINAL  Final  Urine culture     Status: None   Collection Time: 05/06/20  7:37 PM   Specimen: Urine, Catheterized  Result Value Ref Range Status   Specimen Description   Final    URINE, CATHETERIZED Performed at [facility], [address], [place], [place] [address]    Special Requests   Final    NONE Performed at [facility], [address] [place], [place] [address]    Culture   Final    NO GROWTH Performed at [facility], [address] [place], [place] [address]    Report Status 05/07/2020 FINAL  Final  Blood Culture (routine x 2)     Status: None (Preliminary result)   Collection Time: 05/06/20  7:37 PM   Specimen: BLOOD LEFT FOREARM  Result Value Ref Range Status   Specimen Description   Final    BLOOD LEFT FOREARM Performed at [facility], [address], [place], [place] [address]    Special Requests   Final    BOTTLES DRAWN AEROBIC AND ANAEROBIC Blood Culture adequate volume Performed at [facility], [address], [place], [place] [address]    Culture   Final    NO GROWTH 2 DAYS Performed at [facility], [address], [place], [place] [address]    Report Status PENDING  Incomplete  Resp Panel by RT-PCR (Flu A&B, Covid) Nasopharyngeal Swab     Status: None   Collection Time: 05/06/20  7:37 PM   Specimen: Nasopharyngeal Swab; Nasopharyngeal(NP) swabs in vial transport medium  Result Value Ref Range Status   SARS Coronavirus 2 by RT PCR NEGATIVE NEGATIVE Final    Comment: (NOTE) SARS-CoV-2 target nucleic acids are NOT DETECTED.  The SARS-CoV-2 RNA is generally detectable in upper respiratory specimens during the  acute phase of infection. The lowest concentration of SARS-CoV-2 viral copies this assay can detect is 138 copies/mL. A negative result does not preclude SARS-Cov-2 infection and should not be used as the sole basis for treatment or other patient management  decisions. A negative result may occur with  improper specimen collection/handling, submission of specimen other than nasopharyngeal swab, presence of viral mutation(s) within the areas targeted by this assay, and inadequate number of viral copies(<138 copies/mL). A negative result must be combined with clinical observations, patient history, and epidemiological information. The expected result is Negative.  Fact Sheet for Patients:  BloggerCourse.comhttps://www.fda.gov/media/152166/download  Fact Sheet for Healthcare Providers:  SeriousBroker.ithttps://www.fda.gov/media/152162/download  This test is no t yet approved or cleared by the [place] FDA and  has been authorized for detection and/or diagnosis of SARS-CoV-2 by FDA under an Emergency Use Authorization (EUA). This EUA will remain  in effect (meaning this test can be used) for the duration of the COVID-19 declaration under Section 564(b)(1) of the Act, 21 U.S.C.section 360bbb-3(b)(1), unless the authorization is terminated  or revoked sooner.       Influenza A by PCR NEGATIVE NEGATIVE Final   Influenza B by PCR NEGATIVE NEGATIVE Final    Comment: (NOTE) The Xpert Xpress SARS-CoV-2/FLU/RSV plus assay is intended as an aid in the diagnosis of influenza from Nasopharyngeal swab specimens and should not be used as a sole basis for treatment. Nasal washings and aspirates are unacceptable for Xpert Xpress SARS-CoV-2/FLU/RSV testing.  Fact Sheet for Patients: BloggerCourse.comhttps://www.fda.gov/media/152166/download  Fact Sheet for Healthcare Providers: SeriousBroker.ithttps://www.fda.gov/media/152162/download  This test is not yet approved or cleared by the [place] FDA and has been authorized for detection and/or diagnosis of SARS-CoV-2 by FDA under an Emergency Use Authorization (EUA). This EUA will remain in effect (meaning this test can be used) for the duration of the COVID-19 declaration under Section 564(b)(1) of the Act, 21 U.S.C. section 360bbb-3(b)(1), unless the  authorization is terminated or revoked.  Performed at [facility], [address] [place], [place] [address]   Aerobic/Anaerobic Culture (surgical/deep wound)     Status: None (Preliminary result)   Collection Time: 05/07/20  2:58 PM   Specimen: Abscess  Result Value Ref Range Status   Specimen Description   Final    ABSCESS Performed at [facility], [address] [place], [place] [address]    Special Requests   Final    Normal Performed at [facility], [address] [place], [place] [address]    Gram Stain   Final    ABUNDANT WBC PRESENT,BOTH PMN AND MONONUCLEAR NO ORGANISMS SEEN Performed at [facility], [address] [place], [place] [address]    Culture   Final    FEW PSEUDOMONAS AERUGINOSA NO ANAEROBES ISOLATED; CULTURE IN PROGRESS FOR 5 DAYS    Report Status PENDING  Incomplete   Organism ID, Bacteria PSEUDOMONAS AERUGINOSA  Final      Susceptibility   Pseudomonas aeruginosa - MIC*    CEFTAZIDIME 4 SENSITIVE Sensitive     CIPROFLOXACIN <=0.25 SENSITIVE Sensitive     GENTAMICIN 2 SENSITIVE Sensitive     IMIPENEM <=0.25 SENSITIVE Sensitive     PIP/TAZO 8 SENSITIVE Sensitive     CEFEPIME 1 SENSITIVE Sensitive     * FEW PSEUDOMONAS AERUGINOSA     Radiology Studies: No results found. Scheduled Meds: . clonazePAM  0.5 mg Oral QHS  . divalproex  250 mg Oral TID  . memantine  10  mg Oral BID  . QUEtiapine  12.5 mg Oral Daily  . QUEtiapine  25 mg Oral QHS  . sodium chloride flush  5 mL Intracatheter Q8H   Continuous Infusions: . sodium chloride 500 mL (05/10/20 0852)  . piperacillin-tazobactam (ZOSYN)  IV 3.375 g (05/10/20 0849)     LOS: 4 days    Time spent: 25 mins    Loda Bialas, MD [facility]   If 7PM-7AM, please contact night-coverage

## 2020-05-11 LAB — GLUCOSE, CAPILLARY
Glucose-Capillary: 76 mg/dL (ref 70–99)
Glucose-Capillary: 83 mg/dL (ref 70–99)
Glucose-Capillary: 86 mg/dL (ref 70–99)
Glucose-Capillary: 88 mg/dL (ref 70–99)

## 2020-05-11 LAB — MAGNESIUM: Magnesium: 2.2 mg/dL (ref 1.7–2.4)

## 2020-05-11 LAB — BASIC METABOLIC PANEL
Anion gap: 10 (ref 5–15)
BUN: 13 mg/dL (ref 8–23)
CO2: 26 mmol/L (ref 22–32)
Calcium: 8.8 mg/dL — ABNORMAL LOW (ref 8.9–10.3)
Chloride: 103 mmol/L (ref 98–111)
Creatinine, Ser: 0.96 mg/dL (ref 0.44–1.00)
GFR, Estimated: 58 mL/min — ABNORMAL LOW (ref >60)
Glucose, Bld: 88 mg/dL (ref 70–99)
Potassium: 3.7 mmol/L (ref 3.5–5.1)
Sodium: 139 mmol/L (ref 135–145)

## 2020-05-11 LAB — PHOSPHORUS: Phosphorus: 3.9 mg/dL (ref 2.5–4.6)

## 2020-05-11 NOTE — Progress Notes (Signed)
Referring Physician(s): Arvilla Market  Supervising Physician: Malachy Moan  Patient Status:  [facility] - In-pt  Chief Complaint:   Abdominal pain/abscess  Subjective: Pt eating lunch, denies worsening abd pain,N/V; minimal soreness at RUQ drain site   Allergies: Fish-derived products and Pravachol [pravastatin sodium]  Medications: Prior to Admission medications   Medication Sig Start Date End Date Taking? Authorizing Provider  CALCIUM-VITAMIN D PO Take 1 tablet by mouth in the morning and at bedtime. 600mg  (1,500mg ) - 400 unit   Yes [provider]  clonazePAM (KLONOPIN) 0.5 MG tablet Take 0.5 mg by mouth at bedtime.   Yes [provider]  divalproex (DEPAKOTE SPRINKLE) 125 MG capsule Take 250 mg by mouth 2 (two) times daily.   Yes [provider]  donepezil (ARICEPT) 10 MG tablet Take 10 mg by mouth at bedtime.    Yes [provider]  memantine (NAMENDA) 10 MG tablet Take 10 mg by mouth 2 (two) times daily.   Yes [provider]  nitrofurantoin, macrocrystal-monohydrate, (MACROBID) 100 MG capsule Take 100 mg by mouth 2 (two) times daily. 05/02/20  Yes [provider]  POLYVINYL ALCOHOL-POVIDONE OP Place 2 drops into both eyes 3 (three) times daily. drops; 0.5-0.6 %; amt: 2 drops each eye; ophthalmic (eye)   Yes [provider]  QUEtiapine (SEROQUEL) 25 MG tablet Take 12.5 mg by mouth daily.    Yes [provider]  saccharomyces boulardii (FLORASTOR) 250 MG capsule Take 250 mg by mouth 2 (two) times daily. Pt is taking while on antibiotic   Yes [provider]  triamcinolone cream (KENALOG) 0.1 % Apply 1 application topically 2 (two) times daily as needed (rash on ankles). Apply to rash on ankles twice a day until healed.   Yes [provider]  Acetaminophen 325 MG CAPS Take 650 mg by mouth every 4 (four) hours as needed for fever. For fever over 100.6 for 48 hours. Do not exceed 3,000mg  in 24  hours    [provider]  QUEtiapine (SEROQUEL) 25 MG tablet Take 25 mg by mouth at bedtime.    [provider]     Vital Signs: BP 133/66 (BP Location: Right Arm)   Pulse 71   Temp 98.1 F (36.7 C)   Resp 16   Ht 5\' 6"  (1.676 m)   Wt 161 lb 6.4 oz (73.2 kg)   SpO2 98%   BMI 26.05 kg/m   Physical Exam awake/conversant; RUQ drain intact, insertion site ok, mildly tender, OP 35 cc yesterday, 5 cc today serosang fluid, drain irrigated with minimal return  Imaging: 14/8/21 IMAGE GUIDED FLUID DRAIN BY CATHETER  Result Date: 05/07/2020 INDICATION: Fluid collection superior to the dome of the liver and subdiaphragmatic in location. EXAM: ULTRASOUND-GUIDED PERCUTANEOUS CATHETER DRAINAGE PERIHEPATIC ABSCESS MEDICATIONS: No additional medications other than sedative medications documented below. ANESTHESIA/SEDATION: Fentanyl 25 mcg IV; Versed 0.5 mg IV Moderate Sedation Time:  12 minutes. The patient was continuously monitored during the procedure by the interventional radiology nurse under my direct supervision. COMPLICATIONS: None immediate. PROCEDURE: Informed written consent was obtained from the patient after a thorough discussion of the procedural risks, benefits and alternatives. All questions were addressed. Maximal Sterile Barrier Technique was utilized including caps, mask, sterile gowns, sterile gloves, sterile drape, hand hygiene and skin antiseptic. A timeout was performed prior to the initiation of the procedure. Ultrasound was used to localize a fluid collection superior to the dome of the liver. Under direct ultrasound guidance, and following infiltration  of 1% lidocaine for local anesthesia, a 5 [place] Yueh centesis catheter was inserted over a 19 gauge needle into the fluid collection from a lateral approach. Aspiration of fluid was performed. A 20 mL sample was collected and sent for culture analysis. The Yueh catheter was removed over a guidewire. The tract was dilated  to 10 [place]. A 10 French percutaneous drainage catheter was then advanced into the collection. Catheter position was confirmed by ultrasound. The catheter was flushed with saline and connected to a suction bulb. It was secured at the skin with a Prolene retention suture and StatLock device. FINDINGS: Aspiration at the level of the lentiform shaped suprahepatic fluid collection yielded grossly purulent fluid. Due to nature of fluid, a percutaneous drainage catheter was placed. There is good return of fluid from the drain after placement and attachment to suction bulb drainage. IMPRESSION: Ultrasound-guided aspiration of the right-sided suprahepatic/infradiaphragmatic fluid collection yielded grossly purulent fluid. A sample was sent for culture analysis. A 10 French percutaneous drainage catheter was placed and attached to suction bulb drainage. Electronically Signed   By: Irish Lack M.D.   On: 05/07/2020 15:51    Labs:  CBC: Recent Labs    05/02/20 2225 05/06/20 1937 05/08/20 0704 05/10/20 0511  WBC 7.3 6.9 6.0 6.4  HGB 11.4* 11.3* 10.5* 10.5*  HCT 36.1 35.3* 32.4* 32.7*  PLT 132* 117* 138* 228    COAGS: Recent Labs    05/06/20 1937  INR 1.2  APTT 36    BMP: Recent Labs    07/12/19 0000 09/15/19 0000 11/01/19 0000 12/27/19 0000 01/24/20 0000 03/28/20 0000 05/08/20 0446 05/09/20 0539 05/10/20 0511 05/11/20 0532  NA 141 140 140   < > 132*   < > 140 139 142 139  K 4.4 4.0 4.8   < > 4.3   < > 3.2* 3.8 3.6 3.7  CL 104 100 103   < > 96*   < > 106 105 105 103  CO2 30* 30* 29*   < > 29*   < > 24 24 30 26   GLUCOSE  --   --   --   --   --    < > 98 83 88 88  BUN 13 50* 15   < > 10   < > 12 15 12 13   CALCIUM 9.1 9.3 9.3   < > 8.5*   < > 8.2* 8.5* 8.5* 8.8*  CREATININE 1.0 1.2* 1.0   < > 0.9   < > 0.93 0.93 1.06* 0.96  GFRNONAA 53 38 50  --  58   < > >60 >60 51* 58*  GFRAA 61 44 58  --  68  --   --   --   --   --    < > = values in this interval not displayed.    LIVER  FUNCTION TESTS: Recent Labs    03/28/20 0000 05/06/20 1937 05/08/20 0446 05/10/20 0511  BILITOT  --  0.5 0.6 0.6  AST 12* 66* 30 21  ALT 6* 108* 64* 38  ALKPHOS 66 183* 152* 141*  PROT  --  6.2* 5.7* 5.7*  ALBUMIN 3.7 2.7* 2.4* 2.3*    Assessment and Plan: Pt with hx recent fevers, abd pain, fluid collection superior to dome of liver, s/p drain placement 12/13; currently afebrile; creat nl; drain fluid cx- few pseudomonas; OP decreasing; will plan f/u CT on 12/18   Electronically Signed: D. 1/14, PA-C 05/11/2020, 11:54 AM  I spent a total of 15 minutes at the the patient's bedside AND on the patient's hospital floor or unit, greater than 50% of which was counseling/coordinating care for right upper abdominal/suprahepatic fluid collection drain     Patient ID: Sarah Larsen, female   DOB: [birth year], 86 y.o.   MRN: [id number]

## 2020-05-11 NOTE — Progress Notes (Signed)
Occupational Therapy Treatment Patient Details Name: Sarah Larsen MRN: [id number] DOB: 01-29-1933 Today's Date: 05/11/2020    History of present illness 86 year old female admitted 05/06/20 with fever, found to have suprahepatic abscess, s/p drain placement 05/07/20. PMH of dementia. Pt is from [facility].   OT comments  Treatment focused on improving functional mobility and performing normal self care tasks. Patient max assist to don underwear and perform toileting - relying on external support of therapist to maintain her balance during dressing and toileting. Patient limited today by posterior lean requiring mod assist for standing and taking steps with RW.    Follow Up Recommendations  SNF    Equipment Recommendations  None recommended by OT    Recommendations for Other Services      Precautions / Restrictions Precautions Precautions: Fall Precaution Comments: pt's cousin Dorothyann Gibbs reports pt had several falls recently Restrictions Weight Bearing Restrictions: No       Mobility Bed Mobility Overal bed mobility: Needs Assistance Bed Mobility: Sit to Supine       Sit to supine: Max assist   General bed mobility comments: Max assist to return to supine for LEs and pivoting into bed and some assistance to guide trunk.  Transfers Overall transfer level: Needs assistance Equipment used: Rolling walker (2 wheeled) Transfers: Sit to/from Stand Sit to Stand: Mod assist;From elevated surface Stand pivot transfers: Mod assist       General transfer comment: Mod assist for sit to stands x 4 - from recliner, bed and BSCx 2. Patient requiried mod assist to maintain standing due to strong posterior lean. Able to hold onto walker and take steps with mod assist to support balance.    Balance Overall balance assessment: Needs assistance;History of Falls Sitting-balance support: No upper extremity supported;Feet supported Sitting balance-Leahy Scale: Fair   Postural control:  Posterior lean Standing balance support: Bilateral upper extremity supported;During functional activity Standing balance-Leahy Scale: Poor Standing balance comment: relies on external support                           ADL either performed or assessed with clinical judgement   ADL                       Lower Body Dressing: Sit to/from stand;Maximal assistance Lower Body Dressing Details (indicate cue type and reason): max assist to don underwear; patient able to pull underwear up from knees over hips - however, therapist providing mod assist to maintain patient's balance due to posterior lean. Toilet Transfer: BSC;Stand-pivot;Moderate assistance;RW Toilet Transfer Details (indicate cue type and reason): Mod assis to pivot from edge of bed to [facility] with RW and mod assist from therapist to reduce patient's posterior lean. Toileting- Clothing Manipulation and Hygiene: Maximal assistance;Sit to/from stand Toileting - Clothing Manipulation Details (indicate cue type and reason): max assist needed for clothing management, patient able to wipe self in squated position - however therapist providing mod assist to maintain balance and position.             Vision   Vision Assessment?: No apparent visual deficits   Perception     Praxis      Cognition Arousal/Alertness: Awake/alert Behavior During Therapy: WFL for tasks assessed/performed Overall Cognitive Status: History of cognitive impairments - at baseline  General Comments: requires verbal cues to perform tasks. Patient very inquisitive.        Exercises     Shoulder Instructions       General Comments      Pertinent Vitals/ Pain       Faces Pain Scale: Hurts a little bit Pain Location: RIGHT flank side near drain with activity and R knee with WBing during gait Pain Descriptors / Indicators: Grimacing Pain Intervention(s): Monitored during session  Home  Living                                          Prior Functioning/Environment              Frequency  Min 2X/week        Progress Toward Goals  OT Goals(current goals can now be found in the care plan section)  Progress towards OT goals: Progressing toward goals  Acute Rehab OT Goals Patient Stated Goal: to improve mobility OT Goal Formulation: With patient/family Time For Goal Achievement: 05/23/20 Potential to Achieve Goals: Fair  Plan Discharge plan remains appropriate    Co-evaluation                 AM-PAC OT "6 Clicks" Daily Activity     Outcome Measure   Help from another person eating meals?: A Little Help from another person taking care of personal grooming?: A Little Help from another person toileting, which includes using toliet, bedpan, or urinal?: A Lot Help from another person bathing (including washing, rinsing, drying)?: A Lot Help from another person to put on and taking off regular upper body clothing?: A Lot Help from another person to put on and taking off regular lower body clothing?: A Lot 6 Click Score: 14    End of Session Equipment Utilized During Treatment: Gait belt;Rolling walker  OT Visit Diagnosis: Unsteadiness on feet (R26.81);Other abnormalities of gait and mobility (R26.89);Muscle weakness (generalized) (M62.81)   Activity Tolerance Patient tolerated treatment well   Patient Left with call bell/phone within reach;in bed;with bed alarm set   Nurse Communication Mobility status        Time: 5643-3295 OT Time Calculation (min): 17 min  Charges: OT General Charges $OT Visit: 1 Visit OT Treatments $Self Care/Home Management : 8-22 mins  Waldron Session, OTR/L Acute Care Rehab Services  Office [phone] Pager: [phone]    Kelli Churn 05/11/2020, 2:23 PM

## 2020-05-11 NOTE — Progress Notes (Signed)
Pharmacy Antibiotic Note  Sarah Larsen is a 86 y.o. female admitted on 05/06/2020 with Intra-abd infection.  Pharmacy has been consulted for Zosyn dosing.  Plan: Continue Zosyn 3.375g IV q8h (4 hour infusion). Height: 5\' 6"  (167.6 cm) Weight: 73.2 kg (161 lb 6.4 oz) IBW/kg (Calculated) : 59.3  Temp (24hrs), Avg:98.1 F (36.7 C), Min:97.9 F (36.6 C), Max:98.4 F (36.9 C)  Recent Labs  Lab 05/06/20 1937 05/08/20 0446 05/08/20 0704 05/09/20 0539 05/10/20 0511 05/11/20 0532  WBC 6.9  --  6.0  --  6.4  --   CREATININE 1.12* 0.93  --  0.93 1.06* 0.96  LATICACIDVEN 1.5  --   --   --   --   --     Estimated Creatinine Clearance: 43.9 mL/min (by C-G formula based on SCr of 0.96 mg/dL).    Allergies  Allergen Reactions  . Fish-Derived Products   . Pravachol [Pravastatin Sodium]     Antimicrobials this admission: 12/12 Zosyn >>   Dose adjustments this admission:  Microbiology results: 12/12 BCx x1: ngtd 12/12 UCx: NGF  12/13 Abscess: few Pseudomonas ( pansens)   Thank you for allowing pharmacy to be a part of this patient's care.   1/14, PharmD, BCPS 05/11/2020 2:08 PM

## 2020-05-11 NOTE — Progress Notes (Signed)
Physical Therapy Treatment Patient Details Name: Sarah Larsen MRN: [id number] DOB: 08-08-1932 Today's Date: 05/11/2020    History of Present Illness 86 year old female admitted 05/06/20 with fever, found to have suprahepatic abscess, s/p drain placement 05/07/20. PMH of dementia. Pt is from [facility].    PT Comments    Pt sleeping with Kathy Breach in room.  Pt somewhat easy to arouse.  She repeat "good Morning".  I explained my role and re directed to current location and that I was going to help her OOB and get some coffee.  Pt responded, "What time is it?".  Pt only AxO x 1 but following repeat functional commands with increased time.  First, assisted OOB to [facility].  General bed mobility comments: Max Assist and Max encouragement with increased time to re orient on current situation. General transfer comment: pt required 75% VC's on current task OOB to [facility] then required + 2 assist for safety.  Pt was able to rise from bed at [facility] Assist but then required increased assist and VC's to complete 1/4 turn to [facility] with hand over hand transfer assist and target decend.  Rising off BSC pt required + 2 side by side assist and 75% VC's to transfer hands from [facility] to walker.  Pt able to static stand but required Mod Assist to steady and second assist to perform peri care after loose watery stool.  Poor balance.  Impaired self correction posterior lean and poor forward flex posture.  HIGH FALL RISK. General Gait Details: Very unsteady gait requiring + 2 side by side assist and advancement of walker.  Decreased WBing tolerance and c/o R knee pain.  Pt was only able to amb 4 feet enough to pull recliner from behind behind.  Very limited amb due to impaired cognition, weakness and c/o pain (R flank near drain and R knee) Positioned in recliner to comfort and Dorothyann Gibbs helped by setting up pt's breakfast tray.   Pt is NOT amb as she did prior and will need ST Rehab at SNF prior to returning to her Memory Care Unit   Follow Up Recommendations  SNF     Equipment Recommendations       Recommendations for Other Services       Precautions / Restrictions Precautions Precautions: Fall Precaution Comments: pt's cousin Dorothyann Gibbs reports pt had several falls recently Restrictions Weight Bearing Restrictions: No    Mobility  Bed Mobility Overal bed mobility: Needs Assistance Bed Mobility: Supine to Sit     Supine to sit: Max assist     General bed mobility comments: Max Assist and Max encouragement with increased time to re orient on current situation.  Transfers Overall transfer level: Needs assistance Equipment used: None Transfers: Sit to/from Stand Sit to Stand: Mod assist;+2 safety/equipment Stand pivot transfers: Mod assist;Max assist;+2 safety/equipment       General transfer comment: pt required 75% VC's on current task OOB to [facility] then required + 2 assist for safety.  Pt was able to rise from bed at [facility] Assist but then required increased assist and VC's to complete 1/4 turn to [facility] with hand over hand transfer assist and target decend.  Rising off BSC pt required + 2 side by side assist and 75% VC's to transfer hands from [facility] to walker.  Pt able to static stand but required Mod Assist to steady and second assist to perform peri care after loose watery stool.  Poor balance.  Impaired self correction posterior lean and  poor forward flex posture.  HIGH FALL RISK.  Ambulation/Gait Ambulation/Gait assistance: Mod assist;+2 physical assistance;+2 safety/equipment Gait Distance (Feet): 4 Feet Assistive device: Rolling walker (2 wheeled) Gait Pattern/deviations: Step-to pattern;Shuffle;Decreased stance time - right;Decreased step length - right;Decreased step length - left;Trunk flexed;Narrow base of support Gait velocity: decreased   General Gait Details: Very unsteady gait requiring + 2 side by side assist and advancement of walker.  Decreased WBing tolerance and c/o R knee pain.  Pt was only  able to amb 4 feet enough to pull recliner from behind behind.  Very limited amb due to impaired cognition, weakness and c/o pain (R flank near drain and R knee)   Stairs             Wheelchair Mobility    Modified Rankin (Stroke Patients Only)       Balance                                            Cognition Arousal/Alertness: Awake/alert Behavior During Therapy: WFL for tasks assessed/performed Overall Cognitive Status: History of cognitive impairments - at baseline                                 General Comments: AxO x 1 requiring repeat VC's to complete task.  "What time is it?" and redirected to current situation with some retention "I'm in the hospital?  Why?" Cousin Marti in room also reorientating pt and assisting with breakfast set up.      Exercises      General Comments        Pertinent Vitals/Pain Pain Assessment: Faces Faces Pain Scale: Hurts a little bit Pain Location: RIGHT flank side near drain with activity and R knee with WBing during gait Pain Descriptors / Indicators: Grimacing;Guarding;Discomfort Pain Intervention(s): Monitored during session;Repositioned    Home Living                      Prior Function            PT Goals (current goals can now be found in the care plan section) Progress towards PT goals: Progressing toward goals    Frequency    Min 2X/week      PT Plan Current plan remains appropriate    Co-evaluation              AM-PAC PT "6 Clicks" Mobility   Outcome Measure  Help needed turning from your back to your side while in a flat bed without using bedrails?: A Lot Help needed moving from lying on your back to sitting on the side of a flat bed without using bedrails?: A Lot Help needed moving to and from a bed to a chair (including a wheelchair)?: A Lot Help needed standing up from a chair using your arms (e.g., wheelchair or bedside chair)?: A Lot Help needed  to walk in hospital room?: Total Help needed climbing 3-5 steps with a railing? : Total 6 Click Score: 10    End of Session Equipment Utilized During Treatment: Gait belt Activity Tolerance: Patient limited by fatigue Patient left: in chair;with call bell/phone within reach;with chair alarm set Nurse Communication: Mobility status PT Visit Diagnosis: Difficulty in walking, not elsewhere classified (R26.2);History of falling (Z91.81)     Time: 8757-9728 PT Time  Calculation (min) (ACUTE ONLY): 30 min  Charges:  $Gait Training: 8-22 mins $Therapeutic Activity: 8-22 mins                     Felecia Shelling  PTA Acute  Rehabilitation Services Pager      [phone] Office      [phone]

## 2020-05-11 NOTE — Progress Notes (Signed)
PROGRESS NOTE    Sarah Larsen  WJX:[id number] DOB: March 16, 1933 DOA: 05/06/2020 PCP: [name], MD   Brief Narrative:  This 86 years old female , a retired Psychologist, counselling with PMH of dementia, liver abscess, who was sent to the emergency department for the evaluation of fever from memory care unit.  She was recently diagnosed with UTI and was on antibiotics.  She continues to remain febrile and has chills at home and with some abdominal discomfort.  She was febrile with elevated liver enzymes in the ED.  CT abdomen pelvis showed 10.7 cm fluid collection around the dome of liver.  She is started on antibiotics,  IR was consulted.  She underwent abscess aspiration.  She has minimal pinkish output in the drain.  Assessment & Plan:   Active Problems:   Vascular dementia (HCC)   Hyperlipidemia   Fever   Intraabdominal fluid collection  Liver abscess:  She presented with abdominal discomfort and fever. CT abdomen/pelvis showed 10.7 cm fluid collection around the dome of liver..  IR consulted and she underwent liver abscess aspiration on 05/07/20.  Status post drain placement. Blood cultures : No growth so far.  Fluid culture grew few Pseudomonas, continue Zosyn. As per the report, she had liver abscess in the past.  Continue Zosyn for now.  Most likely she will be discharged with drain when she is ready.   IR recommended repeat CT scan abdomen 12/18  to ensure resolution of abscess. Will discuss with ID about antibiotic recommendation and duration.  Elevated liver enzymes:  Most likely secondary to fluid collection of the liver.  Improving.  History of dementia:  Continue supportive care.  She is on Depakote, Klonopin, Namenda and Seroquel at home.   She is confused at baseline. She has high risk for delirium.   Will keep the room bright with the sunlight, frequent orientation.  AKI: Improved with IV fluids.  Hypokalemia: Supplemented with potassium.  Normocytic anemia:  Chronic.  Hemoglobin currently stable.  Debility/deconditioning: She lives in a memory care. PT/OT evaluation  Thrombocytopenia: Mild.  Continue to monitor  DVT prophylaxis: SCDs Code Status: Full code. Family Communication:  No family at bed side. Disposition Plan:   Status is: Inpatient  Remains inpatient appropriate because:Inpatient level of care appropriate due to severity of illness   Dispo: The patient is from: Home              Anticipated d/c is to:  SNF               Anticipated d/c date is: 2 days              Patient currently is not medically stable to d/c.   Consultants:   Interventional radiology  Procedures: Liver abscess aspiration and drainage by IR Antimicrobials:  Anti-infectives (From admission, onward)   Start     Dose/Rate Route Frequency Ordered Stop   05/07/20 0800  piperacillin-tazobactam (ZOSYN) IVPB 3.375 g        3.375 g 12.5 mL/hr over 240 Minutes Intravenous Every 8 hours 05/07/20 0051     05/06/20 2315  piperacillin-tazobactam (ZOSYN) IVPB 3.375 g        3.375 g 100 mL/hr over 30 Minutes Intravenous  Once 05/06/20 2302 05/07/20 0015      Subjective: Patient was seen and examined at bedside.  Overnight events noted.   She denies any abdominal pain.  She has a drain on the right side with minimal slight pinkish output noted. Patient  is more alert and awake today,  sitting in the chair having breakfast.  Objective: Vitals:   05/10/20 1353 05/10/20 2055 05/11/20 0538 05/11/20 1322  BP: 116/67 (!) 144/67 133/66 136/72  Pulse: 75 76 71 78  Resp:  18 16 15   Temp: 98.3 F (36.8 C) 97.9 F (36.6 C) 98.1 F (36.7 C) 98.4 F (36.9 C)  TempSrc:    Oral  SpO2: 96% 97% 98% 99%  Weight:      Height:        Intake/Output Summary (Last 24 hours) at 05/11/2020 1418 Last data filed at 05/11/2020 1323 Gross per 24 hour  Intake 904.81 ml  Output 1032.5 ml  Net -127.69 ml   Filed Weights   05/08/20 0730  Weight: 73.2 kg     Examination:  General exam: Appears calm and comfortable, not in any acute distress. Respiratory system: Clear to auscultation. Respiratory effort normal. Cardiovascular system: S1 & S2 heard, RRR. No JVD, murmurs, rubs, gallops or clicks. No pedal edema. Gastrointestinal system: Abdomen is nondistended, soft , mild tenderness noted.. No organomegaly or masses felt.  Normal bowel sounds heard.  Drain on the right side with minimal  output. Central nervous system: Alert and oriented x 1. No focal neurological deficits. Extremities: No edema, no cyanosis, no clubbing. Skin: No rashes, lesions or ulcers Psychiatry: Judgement and insight appear normal. Mood & affect appropriate.     Data Reviewed: I have personally reviewed following labs and imaging studies  CBC: Recent Labs  Lab 05/06/20 1937 05/08/20 0704 05/10/20 0511  WBC 6.9 6.0 6.4  NEUTROABS 5.3  --   --   HGB 11.3* 10.5* 10.5*  HCT 35.3* 32.4* 32.7*  MCV 93.1 93.1 92.1  PLT 117* 138* 228   Basic Metabolic Panel: Recent Labs  Lab 05/06/20 1937 05/08/20 0446 05/09/20 0539 05/10/20 0511 05/11/20 0532  NA 136 140 139 142 139  K 3.6 3.2* 3.8 3.6 3.7  CL 103 106 105 105 103  CO2 23 24 24 30 26   GLUCOSE 131* 98 83 88 88  BUN 17 12 15 12 13   CREATININE 1.12* 0.93 0.93 1.06* 0.96  CALCIUM 8.3* 8.2* 8.5* 8.5* 8.8*  MG  --   --   --  2.0 2.2  PHOS  --   --   --  3.4 3.9   GFR: Estimated Creatinine Clearance: 43.9 mL/min (by C-G formula based on SCr of 0.96 mg/dL). Liver Function Tests: Recent Labs  Lab 05/06/20 1937 05/08/20 0446 05/10/20 0511  AST 66* 30 21  ALT 108* 64* 38  ALKPHOS 183* 152* 141*  BILITOT 0.5 0.6 0.6  PROT 6.2* 5.7* 5.7*  ALBUMIN 2.7* 2.4* 2.3*   No results for input(s): LIPASE, AMYLASE in the last 168 hours. No results for input(s): AMMONIA in the last 168 hours. Coagulation Profile: Recent Labs  Lab 05/06/20 1937  INR 1.2   Cardiac Enzymes: No results for input(s): CKTOTAL,  CKMB, CKMBINDEX, TROPONINI in the last 168 hours. BNP (last 3 results) No results for input(s): PROBNP in the last 8760 hours. HbA1C: No results for input(s): HGBA1C in the last 72 hours. CBG: Recent Labs  Lab 05/10/20 0615 05/10/20 1149 05/11/20 0010 05/11/20 0540 05/11/20 1155  GLUCAP 76 80 86 76 88   Lipid Profile: No results for input(s): CHOL, HDL, LDLCALC, TRIG, CHOLHDL, LDLDIRECT in the last 72 hours. Thyroid Function Tests: No results for input(s): TSH, T4TOTAL, FREET4, T3FREE, THYROIDAB in the last 72 hours. Anemia Panel: No results  for input(s): VITAMINB12, FOLATE, FERRITIN, TIBC, IRON, RETICCTPCT in the last 72 hours. Sepsis Labs: Recent Labs  Lab 05/06/20 1937  LATICACIDVEN 1.5    Recent Results (from the past 240 hour(s))  Blood culture (routine x 2)     Status: None   Collection Time: 05/03/20  2:00 AM   Specimen: BLOOD  Result Value Ref Range Status   Specimen Description BLOOD RIGHT ANTECUBITAL  Final   Special Requests   Final    BOTTLES DRAWN AEROBIC AND ANAEROBIC Blood Culture adequate volume   Culture   Final    NO GROWTH 5 DAYS Performed at [facility], [address] [place], [place] [address]    Report Status 05/08/2020 FINAL  Final  Blood culture (routine x 2)     Status: None   Collection Time: 05/03/20  3:00 AM   Specimen: BLOOD RIGHT HAND  Result Value Ref Range Status   Specimen Description BLOOD RIGHT HAND  Final   Special Requests   Final    BOTTLES DRAWN AEROBIC AND ANAEROBIC Blood Culture results may not be optimal due to an inadequate volume of blood received in culture bottles   Culture   Final    NO GROWTH 5 DAYS Performed at [facility], [address] [place], [place] [address]    Report Status 05/08/2020 FINAL  Final  Urine culture     Status: Abnormal   Collection Time: 05/03/20  3:26 AM   Specimen: Urine, Random  Result Value Ref Range Status   Specimen Description URINE, RANDOM  Final   Special Requests  NONE  Final   Culture (A)  Final    <10,000 COLONIES/mL INSIGNIFICANT GROWTH Performed at [facility], [address] [place], [place] [address]    Report Status 05/04/2020 FINAL  Final  Urine culture     Status: None   Collection Time: 05/06/20  7:37 PM   Specimen: Urine, Catheterized  Result Value Ref Range Status   Specimen Description   Final    URINE, CATHETERIZED Performed at [facility], [address] [place], [place] [address]    Special Requests   Final    NONE Performed at [facility], [address] [place], [place] [address]    Culture   Final    NO GROWTH Performed at [facility], [address] [place], [place] [address]    Report Status 05/07/2020 FINAL  Final  Blood Culture (routine x 2)     Status: None (Preliminary result)   Collection Time: 05/06/20  7:37 PM   Specimen: BLOOD LEFT FOREARM  Result Value Ref Range Status   Specimen Description   Final    BLOOD LEFT FOREARM Performed at [facility], [address] [place], [place] [address]    Special Requests   Final    BOTTLES DRAWN AEROBIC AND ANAEROBIC Blood Culture adequate volume Performed at [facility], [address] [place], [place] [address]    Culture   Final    NO GROWTH 4 DAYS Performed at [facility], [address] [place], [place] [address]    Report Status PENDING  Incomplete  Resp Panel by RT-PCR (Flu A&B, Covid) Nasopharyngeal Swab     Status: None   Collection Time: 05/06/20  7:37 PM   Specimen: Nasopharyngeal Swab; Nasopharyngeal(NP) swabs in vial transport medium  Result Value Ref Range Status   SARS Coronavirus  2 by RT PCR NEGATIVE NEGATIVE Final    Comment: (NOTE) SARS-CoV-2 target nucleic acids are NOT DETECTED.  The SARS-CoV-2 RNA is generally detectable in upper respiratory specimens during the acute phase of infection. The lowest concentration of SARS-CoV-2 viral copies this  assay can detect is 138 copies/mL. A negative result does not preclude SARS-Cov-2 infection and should not be used as the sole basis for treatment or other patient management decisions. A negative result may occur with  improper specimen collection/handling, submission of specimen other than nasopharyngeal swab, presence of viral mutation(s) within the areas targeted by this assay, and inadequate number of viral copies(<138 copies/mL). A negative result must be combined with clinical observations, patient history, and epidemiological information. The expected result is Negative.  Fact Sheet for Patients:  BloggerCourse.com  Fact Sheet for Healthcare Providers:  SeriousBroker.it  This test is no t yet approved or cleared by the [place] FDA and  has been authorized for detection and/or diagnosis of SARS-CoV-2 by FDA under an Emergency Use Authorization (EUA). This EUA will remain  in effect (meaning this test can be used) for the duration of the COVID-19 declaration under Section 564(b)(1) of the Act, 21 U.S.C.section 360bbb-3(b)(1), unless the authorization is terminated  or revoked sooner.       Influenza A by PCR NEGATIVE NEGATIVE Final   Influenza B by PCR NEGATIVE NEGATIVE Final    Comment: (NOTE) The Xpert Xpress SARS-CoV-2/FLU/RSV plus assay is intended as an aid in the diagnosis of influenza from Nasopharyngeal swab specimens and should not be used as a sole basis for treatment. Nasal washings and aspirates are unacceptable for Xpert Xpress SARS-CoV-2/FLU/RSV testing.  Fact Sheet for Patients: BloggerCourse.com  Fact Sheet for Healthcare Providers: SeriousBroker.it  This test is not yet approved or cleared by the [place] FDA and has been authorized for detection and/or diagnosis of SARS-CoV-2 by FDA under an Emergency Use Authorization (EUA). This EUA will  remain in effect (meaning this test can be used) for the duration of the COVID-19 declaration under Section 564(b)(1) of the Act, 21 U.S.C. section 360bbb-3(b)(1), unless the authorization is terminated or revoked.  Performed at [facility], [address], [place], [place] [address]   Aerobic/Anaerobic Culture (surgical/deep wound)     Status: None (Preliminary result)   Collection Time: 05/07/20  2:58 PM   Specimen: Abscess  Result Value Ref Range Status   Specimen Description   Final    ABSCESS Performed at [facility], [address] [place], [place] [address]    Special Requests   Final    Normal Performed at [facility], [address], [place], [place] [address]    Gram Stain   Final    ABUNDANT WBC PRESENT,BOTH PMN AND MONONUCLEAR NO ORGANISMS SEEN Performed at [facility], [address], [place], [place] [address]    Culture   Final    FEW PSEUDOMONAS AERUGINOSA NO ANAEROBES ISOLATED; CULTURE IN PROGRESS FOR 5 DAYS    Report Status PENDING  Incomplete   Organism ID, Bacteria PSEUDOMONAS AERUGINOSA  Final      Susceptibility   Pseudomonas aeruginosa - MIC*    CEFTAZIDIME 4 SENSITIVE Sensitive     CIPROFLOXACIN <=0.25 SENSITIVE Sensitive     GENTAMICIN 2 SENSITIVE Sensitive     IMIPENEM <=0.25 SENSITIVE Sensitive     PIP/TAZO 8 SENSITIVE Sensitive     CEFEPIME 1 SENSITIVE Sensitive     * FEW PSEUDOMONAS  AERUGINOSA     Radiology Studies: No results found. Scheduled Meds: . clonazePAM  0.5 mg Oral QHS  . divalproex  250 mg Oral TID  . memantine  10 mg Oral BID  . QUEtiapine  12.5 mg Oral Daily  . QUEtiapine  25 mg Oral QHS  . sodium chloride flush  5 mL Intracatheter Q8H   Continuous Infusions: . sodium chloride 500 mL (05/11/20 0741)  . piperacillin-tazobactam (ZOSYN)  IV 3.375 g (05/11/20 0743)     LOS: 5 days    Time spent: 25 mins    Toshio Slusher, MD [facility]   If 7PM-7AM, please contact night-coverage

## 2020-05-11 NOTE — [place] FL2 (Signed)
[place] MEDICAID FL2 LEVEL OF CARE SCREENING TOOL     IDENTIFICATION  Patient Name: Sarah Larsen Birthdate: [birth year] Sex: female Admission Date (Current Location): 05/06/2020  [facility] and [place] Number:  Producer, television/film/video and Address:  [facility],  [address], [place] [address]      Provider Number: [id number]  Attending Physician Name and Address:  Cipriano Bunker, MD  Relative Name and Phone Number:       Current Level of Care: Hospital Recommended Level of Care: Skilled Nursing Facility Prior Approval Number:    Date Approved/Denied:   PASRR Number: [id number]  Discharge Plan: SNF    Current Diagnoses: Patient Active Problem List   Diagnosis Date Noted  . Intraabdominal fluid collection 05/07/2020  . Fever 05/06/2020  . Acute renal injury (HCC) 05/03/2020  . Generalized weakness 05/03/2020  . UTI (urinary tract infection) 05/03/2020  . Elevated TSH 03/28/2020  . Weight gain 03/22/2020  . Hyponatremia 01/18/2020  . Depression with anxiety 01/05/2020  . Fall 12/28/2019  . Dry eyes 11/09/2019  . Dizziness 08/24/2019  . Agitation 08/24/2019  . Vascular dementia (HCC) 01/15/2019  . Hyperlipidemia 01/15/2019  . Vitamin D insufficiency 01/15/2019  . Unsteady gait 01/15/2019    Orientation RESPIRATION BLADDER Height & Weight     Self,Place  Normal Incontinent Weight: 161 lb 6.4 oz (73.2 kg) Height:  5\' 6"  (167.6 cm)  BEHAVIORAL SYMPTOMS/MOOD NEUROLOGICAL BOWEL NUTRITION STATUS      Incontinent    AMBULATORY STATUS COMMUNICATION OF NEEDS Skin   Extensive Assist Verbally Surgical wounds (JP drain)                       Personal Care Assistance Level of Assistance  Bathing,Dressing Bathing Assistance: Limited assistance   Dressing Assistance: Limited assistance     Functional Limitations Info             SPECIAL CARE FACTORS FREQUENCY  PT (By licensed PT),OT (By licensed OT)     PT Frequency: 5x/wk OT Frequency:  5x/wk            Contractures Contractures Info: Not present    Additional Factors Info  Code Status,Allergies,Psychotropic Code Status Info: DNR Allergies Info: see MAR Psychotropic Info: see MAR         Current Medications (05/11/2020):  This is the current hospital active medication list Current Facility-Administered Medications  Medication Dose Route Frequency Provider Last Rate Last Admin  . 0.9 %  sodium chloride infusion   Intravenous PRN 05/13/2020, MD 10 mL/hr at 05/11/20 0741 500 mL at 05/11/20 0741  . acetaminophen (TYLENOL) tablet 650 mg  650 mg Oral Q6H PRN 05/13/20, MD   650 mg at 05/08/20 2023   Or  . acetaminophen (TYLENOL) suppository 650 mg  650 mg Rectal Q6H PRN 2024, MD      . clonazePAM Eduard Clos) tablet 0.5 mg  0.5 mg Oral QHS Scarlette Calico, MD   0.5 mg at 05/10/20 2156  . divalproex (DEPAKOTE SPRINKLE) capsule 250 mg  250 mg Oral TID 2157, MD   250 mg at 05/11/20 1013  . memantine (NAMENDA) tablet 10 mg  10 mg Oral BID 05/13/20, MD   10 mg at 05/11/20 1012  . piperacillin-tazobactam (ZOSYN) IVPB 3.375 g  3.375 g Intravenous Q8H Green, Terri L, RPH 12.5 mL/hr at 05/11/20 0743 3.375 g at 05/11/20 0743  . QUEtiapine (SEROQUEL) tablet 12.5 mg  12.5 mg Oral Daily Eduard Clos, MD   12.5 mg at 05/11/20 1012  . QUEtiapine (SEROQUEL) tablet 25 mg  25 mg Oral QHS Eduard Clos, MD   25 mg at 05/10/20 2156  . sodium chloride flush (NS) 0.9 % injection 5 mL  5 mL Intracatheter Q8H Irish Lack, MD   5 mL at 05/11/20 1312     Discharge Medications: Please see discharge summary for a list of discharge medications.  Relevant Imaging Results:  Relevant Lab Results:   Additional Information SS# [id number]  Amada Jupiter, LCSW

## 2020-05-12 ENCOUNTER — Inpatient Hospital Stay (HOSPITAL_COMMUNITY): Payer: Medicare Other

## 2020-05-12 LAB — CBC
HCT: 33.9 % — ABNORMAL LOW (ref 36.0–46.0)
Hemoglobin: 10.9 g/dL — ABNORMAL LOW (ref 12.0–15.0)
MCH: 29.9 pg (ref 26.0–34.0)
MCHC: 32.2 g/dL (ref 30.0–36.0)
MCV: 92.9 fL (ref 80.0–100.0)
Platelets: 242 10*3/uL (ref 150–400)
RBC: 3.65 MIL/uL — ABNORMAL LOW (ref 3.87–5.11)
RDW: 15.2 % (ref 11.5–15.5)
WBC: 7.5 10*3/uL (ref 4.0–10.5)
nRBC: 0 % (ref 0.0–0.2)

## 2020-05-12 LAB — RESP PANEL BY RT-PCR (FLU A&B, COVID) ARPGX2
Influenza A by PCR: NEGATIVE
Influenza B by PCR: NEGATIVE
SARS Coronavirus 2 by RT PCR: NEGATIVE

## 2020-05-12 LAB — GLUCOSE, CAPILLARY
Glucose-Capillary: 101 mg/dL — ABNORMAL HIGH (ref 70–99)
Glucose-Capillary: 82 mg/dL (ref 70–99)
Glucose-Capillary: 92 mg/dL (ref 70–99)
Glucose-Capillary: 93 mg/dL (ref 70–99)

## 2020-05-12 LAB — BASIC METABOLIC PANEL
Anion gap: 11 (ref 5–15)
BUN: 13 mg/dL (ref 8–23)
CO2: 27 mmol/L (ref 22–32)
Calcium: 8.7 mg/dL — ABNORMAL LOW (ref 8.9–10.3)
Chloride: 103 mmol/L (ref 98–111)
Creatinine, Ser: 1.09 mg/dL — ABNORMAL HIGH (ref 0.44–1.00)
GFR, Estimated: 50 mL/min — ABNORMAL LOW (ref >60)
Glucose, Bld: 87 mg/dL (ref 70–99)
Potassium: 3.5 mmol/L (ref 3.5–5.1)
Sodium: 141 mmol/L (ref 135–145)

## 2020-05-12 LAB — CULTURE, BLOOD (ROUTINE X 2)
Culture: NO GROWTH
Special Requests: ADEQUATE

## 2020-05-12 LAB — PHOSPHORUS: Phosphorus: 3.5 mg/dL (ref 2.5–4.6)

## 2020-05-12 LAB — MAGNESIUM: Magnesium: 2.2 mg/dL (ref 1.7–2.4)

## 2020-05-12 MED ORDER — IOHEXOL 300 MG/ML  SOLN
100.0000 mL | Freq: Once | INTRAMUSCULAR | Status: AC | PRN
  Administered 2020-05-12: 09:00:00 80 mL via INTRAVENOUS

## 2020-05-12 NOTE — Progress Notes (Signed)
Referring Physician(s): Arvilla Market  Supervising Physician: Dr. Fredia Sorrow  Patient Status:  Sarah Larsen - In-pt  Chief Complaint:   Abdominal pain/abscess  Subjective: Denies worsening abd pain,N/V; minimal soreness at RUQ drain site Had CT earlier this am. Reviewed with Dr. Fredia Sorrow, fluid collection resolved.   Allergies: Fish-derived products and Pravachol [pravastatin sodium]  Medications:  Current Facility-Administered Medications:  .  0.9 %  sodium chloride infusion, , Intravenous, PRN, Renford Dills, Amrit, MD, Last Rate: 10 mL/hr at 05/11/20 1739, 500 mL at 05/11/20 1739 .  acetaminophen (TYLENOL) tablet 650 mg, 650 mg, Oral, Q6H PRN, 650 mg at 05/08/20 2023 **OR** acetaminophen (TYLENOL) suppository 650 mg, 650 mg, Rectal, Q6H PRN, Eduard Clos, MD .  clonazePAM Scarlette Calico) tablet 0.5 mg, 0.5 mg, Oral, QHS, Eduard Clos, MD, 0.5 mg at 05/11/20 2154 .  divalproex (DEPAKOTE SPRINKLE) capsule 250 mg, 250 mg, Oral, TID, Eduard Clos, MD, 250 mg at 05/12/20 1024 .  memantine (NAMENDA) tablet 10 mg, 10 mg, Oral, BID, Eduard Clos, MD, 10 mg at 05/12/20 1024 .  piperacillin-tazobactam (ZOSYN) IVPB 3.375 g, 3.375 g, Intravenous, Q8H, Green, Terri L, RPH, Last Rate: 12.5 mL/hr at 05/12/20 0743, 3.375 g at 05/12/20 0743 .  QUEtiapine (SEROQUEL) tablet 12.5 mg, 12.5 mg, Oral, Daily, Eduard Clos, MD, 12.5 mg at 05/12/20 1023 .  QUEtiapine (SEROQUEL) tablet 25 mg, 25 mg, Oral, QHS, Eduard Clos, MD, 25 mg at 05/11/20 2154 .  sodium chloride flush (NS) 0.9 % injection 5 mL, 5 mL, Intracatheter, Q8H, Irish Lack, MD, 5 mL at 05/12/20 0542    Vital Signs: BP (!) 120/58 (BP Location: Left Arm)   Pulse 78   Temp 97.8 F (36.6 C) (Oral)   Resp 18   Ht 5\' 6"  (1.676 m)   Wt 73.2 kg   SpO2 98%   BMI 26.05 kg/m   Physical Exam awake/conversant; RUQ drain intact, insertion site ok, mildly tender, Trace amount of serous  output  Imaging: CT ABDOMEN PELVIS W CONTRAST  Result Date: 05/12/2020 CLINICAL DATA:  Status post percutaneous catheter drainage of abscess superior to the dome of the liver and in a subdiaphragmatic location on 05/07/2020. EXAM: CT ABDOMEN AND PELVIS WITH CONTRAST TECHNIQUE: Multidetector CT imaging of the abdomen and pelvis was performed using the standard protocol following bolus administration of intravenous contrast. CONTRAST:  39mL OMNIPAQUE IOHEXOL 300 MG/ML  SOLN COMPARISON:  CT of the abdomen and pelvis on 05/06/2020 FINDINGS: Lower chest: Stable trace right pleural fluid and small left pleural effusion. Mild bibasilar atelectasis. Hepatobiliary: Suprahepatic percutaneous drain remains in place with resolution of the suprahepatic/subdiaphragmatic abscess. Trace fluid remains adjacent to the drainage catheter. The liver is unremarkable. The gallbladder has been removed. No biliary ductal dilatation identified. Pancreas: Unremarkable. No pancreatic ductal dilatation or surrounding inflammatory changes. Spleen: Normal in size without focal abnormality. Adrenals/Urinary Tract: Adrenal glands are unremarkable. Kidneys are normal, without renal calculi, focal lesion, or hydronephrosis. Bladder is unremarkable. Stomach/Bowel: Bowel shows no evidence of obstruction, inflammation or perforation. Vascular/Lymphatic: Stable atherosclerosis of the abdominal aorta and iliac arteries without evidence of aneurysm. No enlarged lymph nodes identified. Reproductive: Uterus and bilateral adnexa are unremarkable. Other: Stable bilateral inguinal hernias containing fat, right greater than left. No ascites. No new fluid collections. Musculoskeletal: No acute or significant osseous findings. IMPRESSION: 1. Resolution of suprahepatic/subdiaphragmatic abscess after percutaneous drain placement. 2. Stable trace right pleural fluid and small left pleural effusion. 3. Stable bilateral inguinal hernias containing fat, right  greater than left. 4. Aortic atherosclerosis. Aortic Atherosclerosis (ICD10-I70.0). Electronically Signed   By: Irish Lack M.D.   On: 05/12/2020 11:01    Labs:  CBC: Recent Labs    05/06/20 1937 05/08/20 0704 05/10/20 0511 05/12/20 0525  WBC 6.9 6.0 6.4 7.5  HGB 11.3* 10.5* 10.5* 10.9*  HCT 35.3* 32.4* 32.7* 33.9*  PLT 117* 138* 228 242    COAGS: Recent Labs    05/06/20 1937  INR 1.2  APTT 36    BMP: Recent Labs    07/12/19 0000 09/15/19 0000 11/01/19 0000 12/27/19 0000 01/24/20 0000 03/28/20 0000 05/09/20 0539 05/10/20 0511 05/11/20 0532 05/12/20 0525  NA 141 140 140   < > 132*   < > 139 142 139 141  K 4.4 4.0 4.8   < > 4.3   < > 3.8 3.6 3.7 3.5  CL 104 100 103   < > 96*   < > 105 105 103 103  CO2 30* 30* 29*   < > 29*   < > 24 30 26 27   GLUCOSE  --   --   --   --   --    < > 83 88 88 87  BUN 13 50* 15   < > 10   < > 15 12 13 13   CALCIUM 9.1 9.3 9.3   < > 8.5*   < > 8.5* 8.5* 8.8* 8.7*  CREATININE 1.0 1.2* 1.0   < > 0.9   < > 0.93 1.06* 0.96 1.09*  GFRNONAA 53 38 50  --  58   < > >60 51* 58* 50*  GFRAA 61 44 58  --  68  --   --   --   --   --    < > = values in this interval not displayed.    LIVER FUNCTION TESTS: Recent Labs    03/28/20 0000 05/06/20 1937 05/08/20 0446 05/10/20 0511  BILITOT  --  0.5 0.6 0.6  AST 12* 66* 30 21  ALT 6* 108* 64* 38  ALKPHOS 66 183* 152* 141*  PROT  --  6.2* 5.7* 5.7*  ALBUMIN 3.7 2.7* 2.4* 2.3*    Assessment and Plan: Pt with hx recent fevers, abd pain, fluid collection superior to dome of liver, s/p drain placement 12/13 Minimal output and resolution of CT-see full report. Drain removed at beside today without difficulty. Dressing applied.   Electronically Signed: 05/12/20, PA-C 05/12/2020, 2:24 PM   I spent a total of 15 minutes at the the patient's bedside AND on the patient's Lars[facility] floor or unit, greater than 50% of which was counseling/coordinating care for right upper  abdominal/suprahepatic fluid collection drain

## 2020-05-12 NOTE — Progress Notes (Signed)
PROGRESS NOTE    Sarah Larsen  WNU:[id number] DOB: Apr 19, 1933 DOA: 05/06/2020 PCP: [name], MD   Brief Narrative:  This 86 years old female , a retired Psychologist, counselling with PMH of dementia, liver abscess, who was sent to the emergency department for the evaluation of fever from memory care unit.  She was recently diagnosed with UTI and was on antibiotics.  She continues to remain febrile and has chills at nursing home and with some abdominal discomfort.  She was febrile with elevated liver enzymes in the ED.  CT abdomen pelvis showed 10.7 cm fluid collection around the dome of liver.  She is started on antibiotics,  IR was consulted.  She underwent abscess aspiration.  She has minimal pinkish output in the drain.  Repeat CT scan completed 12/18 showed resolution of abscess.  Assessment & Plan:   Active Problems:   Vascular dementia (HCC)   Hyperlipidemia   Fever   Intraabdominal fluid collection  Liver abscess:  She presented with abdominal discomfort and fever. CT abdomen/pelvis showed 10.7 cm fluid collection around the dome of liver..  IR consulted and she underwent liver abscess aspiration on 05/07/20.  Status post drain placement. Blood cultures : No growth so far.  Fluid culture grew few Pseudomonas, Continue Zosyn. As per the report, she had liver abscess in the past.  Continue Zosyn for now.  IR recommended repeat CT scan abdomen 12/18  to ensure resolution of abscess. Repeat CT abdomen shows resolution of suprahepatic/subdiaphragmatic abscess after percutaneous drain placement. Discussed with IR, drain will be removed tomorrow. Cultures grew Pseudomonas sensitive to ciprofloxacin,   Duration total three weeks of antibiotics. Please clarify with Infectious disease . Ciprofloxacin for 16 more days.   Elevated liver enzymes:  Most likely secondary to fluid collection of the liver.  Improving.  History of dementia:  Continue supportive care.  She is on Depakote,  Klonopin, Namenda and Seroquel at home.   She is confused at baseline. She has high risk for delirium.   Will keep the room bright with the sunlight, frequent orientation.  AKI: Improved with IV fluids.  Hypokalemia: Supplemented with potassium.  Normocytic anemia: Chronic.  Hemoglobin currently stable.  Debility/deconditioning: She lives in a memory care. PT/OT evaluation  Thrombocytopenia: Mild.  Continue to monitor  DVT prophylaxis: SCDs Code Status: Full code. Family Communication:  No family at bed side. Disposition Plan:   Status is: Inpatient  Remains inpatient appropriate because:Inpatient level of care appropriate due to severity of illness   Dispo: The patient is from: Home              Anticipated d/c is to:  SNF               Anticipated d/c date is:12/19              Patient currently is not medically stable to d/c.   Consultants:   Interventional radiology  Procedures: Liver abscess aspiration and drainage by IR Antimicrobials:  Anti-infectives (From admission, onward)   Start     Dose/Rate Route Frequency Ordered Stop   05/07/20 0800  piperacillin-tazobactam (ZOSYN) IVPB 3.375 g        3.375 g 12.5 mL/hr over 240 Minutes Intravenous Every 8 hours 05/07/20 0051     05/06/20 2315  piperacillin-tazobactam (ZOSYN) IVPB 3.375 g        3.375 g 100 mL/hr over 30 Minutes Intravenous  Once 05/06/20 2302 05/07/20 0015      Subjective: Patient was  seen and examined at bedside.  Overnight events noted.   She denies any abdominal pain.  She has a drain on the right side with minimal slight pinkish output noted. Patient reports feeling pretty good today.  she has had her breakfast.  Objective: Vitals:   05/11/20 0538 05/11/20 1322 05/11/20 2113 05/12/20 0505  BP: 133/66 136/72 135/66 137/74  Pulse: 71 78 77 74  Resp: 16 15 18 14   Temp: 98.1 F (36.7 C) 98.4 F (36.9 C) 98.1 F (36.7 C) (!) 97.5 F (36.4 C)  TempSrc:  Oral    SpO2: 98% 99% 100%  97%  Weight:      Height:        Intake/Output Summary (Last 24 hours) at 05/12/2020 1308 Last data filed at 05/12/2020 0921 Gross per 24 hour  Intake 1083.01 ml  Output 2235 ml  Net -1151.99 ml   Filed Weights   05/08/20 0730  Weight: 73.2 kg    Examination:  General exam: Appears calm and comfortable, not in any acute distress. Respiratory system: Clear to auscultation. Respiratory effort normal. Cardiovascular system: S1 & S2 heard, RRR. No JVD, murmurs, rubs, gallops or clicks. No pedal edema. Gastrointestinal system: Abdomen is nondistended, soft , mild tenderness noted.. No organomegaly or masses felt.  Normal bowel sounds heard.  Drain on the right side with minimal  output. Central nervous system: Alert and oriented x 1. No focal neurological deficits. Extremities: No edema, no cyanosis, no clubbing. Skin: No rashes, lesions or ulcers Psychiatry: Judgement and insight appear normal. Mood & affect appropriate.     Data Reviewed: I have personally reviewed following labs and imaging studies  CBC: Recent Labs  Lab 05/06/20 1937 05/08/20 0704 05/10/20 0511 05/12/20 0525  WBC 6.9 6.0 6.4 7.5  NEUTROABS 5.3  --   --   --   HGB 11.3* 10.5* 10.5* 10.9*  HCT 35.3* 32.4* 32.7* 33.9*  MCV 93.1 93.1 92.1 92.9  PLT 117* 138* 228 242   Basic Metabolic Panel: Recent Labs  Lab 05/08/20 0446 05/09/20 0539 05/10/20 0511 05/11/20 0532 05/12/20 0525  NA 140 139 142 139 141  K 3.2* 3.8 3.6 3.7 3.5  CL 106 105 105 103 103  CO2 24 24 30 26 27   GLUCOSE 98 83 88 88 87  BUN 12 15 12 13 13   CREATININE 0.93 0.93 1.06* 0.96 1.09*  CALCIUM 8.2* 8.5* 8.5* 8.8* 8.7*  MG  --   --  2.0 2.2 2.2  PHOS  --   --  3.4 3.9 3.5   GFR: Estimated Creatinine Clearance: 38.7 mL/min (A) (by C-G formula based on SCr of 1.09 mg/dL (H)). Liver Function Tests: Recent Labs  Lab 05/06/20 1937 05/08/20 0446 05/10/20 0511  AST 66* 30 21  ALT 108* 64* 38  ALKPHOS 183* 152* 141*  BILITOT  0.5 0.6 0.6  PROT 6.2* 5.7* 5.7*  ALBUMIN 2.7* 2.4* 2.3*   No results for input(s): LIPASE, AMYLASE in the last 168 hours. No results for input(s): AMMONIA in the last 168 hours. Coagulation Profile: Recent Labs  Lab 05/06/20 1937  INR 1.2   Cardiac Enzymes: No results for input(s): CKTOTAL, CKMB, CKMBINDEX, TROPONINI in the last 168 hours. BNP (last 3 results) No results for input(s): PROBNP in the last 8760 hours. HbA1C: No results for input(s): HGBA1C in the last 72 hours. CBG: Recent Labs  Lab 05/11/20 1155 05/11/20 1759 05/12/20 0006 05/12/20 0506 05/12/20 1133  GLUCAP 88 83 93 82 101*  Lipid Profile: No results for input(s): CHOL, HDL, LDLCALC, TRIG, CHOLHDL, LDLDIRECT in the last 72 hours. Thyroid Function Tests: No results for input(s): TSH, T4TOTAL, FREET4, T3FREE, THYROIDAB in the last 72 hours. Anemia Panel: No results for input(s): VITAMINB12, FOLATE, FERRITIN, TIBC, IRON, RETICCTPCT in the last 72 hours. Sepsis Labs: Recent Labs  Lab 05/06/20 1937  LATICACIDVEN 1.5    Recent Results (from the past 240 hour(s))  Blood culture (routine x 2)     Status: None   Collection Time: 05/03/20  2:00 AM   Specimen: BLOOD  Result Value Ref Range Status   Specimen Description BLOOD RIGHT ANTECUBITAL  Final   Special Requests   Final    BOTTLES DRAWN AEROBIC AND ANAEROBIC Blood Culture adequate volume   Culture   Final    NO GROWTH 5 DAYS Performed at [facility], [address] [place], [place] [address]    Report Status 05/08/2020 FINAL  Final  Blood culture (routine x 2)     Status: None   Collection Time: 05/03/20  3:00 AM   Specimen: BLOOD RIGHT HAND  Result Value Ref Range Status   Specimen Description BLOOD RIGHT HAND  Final   Special Requests   Final    BOTTLES DRAWN AEROBIC AND ANAEROBIC Blood Culture results may not be optimal due to an inadequate volume of blood received in culture bottles   Culture   Final    NO GROWTH 5 DAYS Performed  at [facility], [address], [place], [place] [address]    Report Status 05/08/2020 FINAL  Final  Urine culture     Status: Abnormal   Collection Time: 05/03/20  3:26 AM   Specimen: Urine, Random  Result Value Ref Range Status   Specimen Description URINE, RANDOM  Final   Special Requests NONE  Final   Culture (A)  Final    <10,000 COLONIES/mL INSIGNIFICANT GROWTH Performed at [facility], [address], [place], [place] [address]    Report Status 05/04/2020 FINAL  Final  Urine culture     Status: None   Collection Time: 05/06/20  7:37 PM   Specimen: Urine, Catheterized  Result Value Ref Range Status   Specimen Description   Final    URINE, CATHETERIZED Performed at [facility], [address], [place], [place] [address]    Special Requests   Final    NONE Performed at [facility], [address], [place], [place] [address]    Culture   Final    NO GROWTH Performed at [facility], [address], [place], [place] [address]    Report Status 05/07/2020 FINAL  Final  Blood Culture (routine x 2)     Status: None (Preliminary result)   Collection Time: 05/06/20  7:37 PM   Specimen: BLOOD LEFT FOREARM  Result Value Ref Range Status   Specimen Description   Final    BLOOD LEFT FOREARM Performed at [facility], [address], [place], [place] [address]    Special Requests   Final    BOTTLES DRAWN AEROBIC AND ANAEROBIC Blood Culture adequate volume Performed at [facility], [address] [place], [place] [address]    Culture   Final    NO GROWTH 4 DAYS Performed at [facility], [address] [place], [place] [address]    Report Status PENDING  Incomplete  Resp Panel by RT-PCR (Flu A&B,  Covid) Nasopharyngeal Swab     Status: None   Collection Time: 05/06/20  7:37 PM   Specimen: Nasopharyngeal Swab; Nasopharyngeal(NP) swabs in vial transport medium  Result Value Ref Range  Status   SARS Coronavirus 2 by RT PCR NEGATIVE NEGATIVE Final    Comment: (NOTE) SARS-CoV-2 target nucleic acids are NOT DETECTED.  The SARS-CoV-2 RNA is generally detectable in upper respiratory specimens during the acute phase of infection. The lowest concentration of SARS-CoV-2 viral copies this assay can detect is 138 copies/mL. A negative result does not preclude SARS-Cov-2 infection and should not be used as the sole basis for treatment or other patient management decisions. A negative result may occur with  improper specimen collection/handling, submission of specimen other than nasopharyngeal swab, presence of viral mutation(s) within the areas targeted by this assay, and inadequate number of viral copies(<138 copies/mL). A negative result must be combined with clinical observations, patient history, and epidemiological information. The expected result is Negative.  Fact Sheet for Patients:  BloggerCourse.com  Fact Sheet for Healthcare Providers:  SeriousBroker.it  This test is no t yet approved or cleared by the [place] FDA and  has been authorized for detection and/or diagnosis of SARS-CoV-2 by FDA under an Emergency Use Authorization (EUA). This EUA will remain  in effect (meaning this test can be used) for the duration of the COVID-19 declaration under Section 564(b)(1) of the Act, 21 U.S.C.section 360bbb-3(b)(1), unless the authorization is terminated  or revoked sooner.       Influenza A by PCR NEGATIVE NEGATIVE Final   Influenza B by PCR NEGATIVE NEGATIVE Final    Comment: (NOTE) The Xpert Xpress SARS-CoV-2/FLU/RSV plus assay is intended as an aid in the diagnosis of influenza from Nasopharyngeal swab specimens and should not be used as a sole basis for treatment. Nasal washings and aspirates are unacceptable for Xpert Xpress SARS-CoV-2/FLU/RSV testing.  Fact Sheet for  Patients: BloggerCourse.com  Fact Sheet for Healthcare Providers: SeriousBroker.it  This test is not yet approved or cleared by the [place] FDA and has been authorized for detection and/or diagnosis of SARS-CoV-2 by FDA under an Emergency Use Authorization (EUA). This EUA will remain in effect (meaning this test can be used) for the duration of the COVID-19 declaration under Section 564(b)(1) of the Act, 21 U.S.C. section 360bbb-3(b)(1), unless the authorization is terminated or revoked.  Performed at [facility], [address], [place], [place] [address]   Aerobic/Anaerobic Culture (surgical/deep wound)     Status: None (Preliminary result)   Collection Time: 05/07/20  2:58 PM   Specimen: Abscess  Result Value Ref Range Status   Specimen Description   Final    ABSCESS Performed at [facility], [address] [place], [place] [address]    Special Requests   Final    Normal Performed at [facility], [address], [place], [place] [address]    Gram Stain   Final    ABUNDANT WBC PRESENT,BOTH PMN AND MONONUCLEAR NO ORGANISMS SEEN Performed at [facility], [address] [place], [place] [address]    Culture   Final    FEW PSEUDOMONAS AERUGINOSA NO ANAEROBES ISOLATED; CULTURE IN PROGRESS FOR 5 DAYS    Report Status PENDING  Incomplete   Organism ID, Bacteria PSEUDOMONAS AERUGINOSA  Final      Susceptibility   Pseudomonas aeruginosa - MIC*    CEFTAZIDIME 4 SENSITIVE Sensitive     CIPROFLOXACIN <=0.25 SENSITIVE Sensitive  GENTAMICIN 2 SENSITIVE Sensitive     IMIPENEM <=0.25 SENSITIVE Sensitive     PIP/TAZO 8 SENSITIVE Sensitive     CEFEPIME 1 SENSITIVE Sensitive     * FEW PSEUDOMONAS AERUGINOSA  Resp Panel by RT-PCR (Flu A&B, Covid) Nasopharyngeal Swab     Status: None   Collection Time: 05/12/20 10:50 AM   Specimen: Nasopharyngeal Swab;  Nasopharyngeal(NP) swabs in vial transport medium  Result Value Ref Range Status   SARS Coronavirus 2 by RT PCR NEGATIVE NEGATIVE Final    Comment: (NOTE) SARS-CoV-2 target nucleic acids are NOT DETECTED.  The SARS-CoV-2 RNA is generally detectable in upper respiratory specimens during the acute phase of infection. The lowest concentration of SARS-CoV-2 viral copies this assay can detect is 138 copies/mL. A negative result does not preclude SARS-Cov-2 infection and should not be used as the sole basis for treatment or other patient management decisions. A negative result may occur with  improper specimen collection/handling, submission of specimen other than nasopharyngeal swab, presence of viral mutation(s) within the areas targeted by this assay, and inadequate number of viral copies(<138 copies/mL). A negative result must be combined with clinical observations, patient history, and epidemiological information. The expected result is Negative.  Fact Sheet for Patients:  BloggerCourse.comhttps://www.fda.gov/media/152166/download  Fact Sheet for Healthcare Providers:  SeriousBroker.ithttps://www.fda.gov/media/152162/download  This test is no t yet approved or cleared by the [place] FDA and  has been authorized for detection and/or diagnosis of SARS-CoV-2 by FDA under an Emergency Use Authorization (EUA). This EUA will remain  in effect (meaning this test can be used) for the duration of the COVID-19 declaration under Section 564(b)(1) of the Act, 21 U.S.C.section 360bbb-3(b)(1), unless the authorization is terminated  or revoked sooner.       Influenza A by PCR NEGATIVE NEGATIVE Final   Influenza B by PCR NEGATIVE NEGATIVE Final    Comment: (NOTE) The Xpert Xpress SARS-CoV-2/FLU/RSV plus assay is intended as an aid in the diagnosis of influenza from Nasopharyngeal swab specimens and should not be used as a sole basis for treatment. Nasal washings and aspirates are unacceptable for Xpert Xpress  SARS-CoV-2/FLU/RSV testing.  Fact Sheet for Patients: BloggerCourse.comhttps://www.fda.gov/media/152166/download  Fact Sheet for Healthcare Providers: SeriousBroker.ithttps://www.fda.gov/media/152162/download  This test is not yet approved or cleared by the [place] FDA and has been authorized for detection and/or diagnosis of SARS-CoV-2 by FDA under an Emergency Use Authorization (EUA). This EUA will remain in effect (meaning this test can be used) for the duration of the COVID-19 declaration under Section 564(b)(1) of the Act, 21 U.S.C. section 360bbb-3(b)(1), unless the authorization is terminated or revoked.  Performed at [facility], [address] [place], [place] [id number]      Radiology Studies: CT ABDOMEN PELVIS W CONTRAST  Result Date: 05/12/2020 CLINICAL DATA:  Status post percutaneous catheter drainage of abscess superior to the dome of the liver and in a subdiaphragmatic location on 05/07/2020. EXAM: CT ABDOMEN AND PELVIS WITH CONTRAST TECHNIQUE: Multidetector CT imaging of the abdomen and pelvis was performed using the standard protocol following bolus administration of intravenous contrast. CONTRAST:  80mL OMNIPAQUE IOHEXOL 300 MG/ML  SOLN COMPARISON:  CT of the abdomen and pelvis on 05/06/2020 FINDINGS: Lower chest: Stable trace right pleural fluid and small left pleural effusion. Mild bibasilar atelectasis. Hepatobiliary: Suprahepatic percutaneous drain remains in place with resolution of the suprahepatic/subdiaphragmatic abscess. Trace fluid remains adjacent to the drainage catheter. The liver is unremarkable. The gallbladder has been removed. No biliary ductal dilatation identified. Pancreas:  Unremarkable. No pancreatic ductal dilatation or surrounding inflammatory changes. Spleen: Normal in size without focal abnormality. Adrenals/Urinary Tract: Adrenal glands are unremarkable. Kidneys are normal, without renal calculi, focal lesion, or hydronephrosis. Bladder is unremarkable.  Stomach/Bowel: Bowel shows no evidence of obstruction, inflammation or perforation. Vascular/Lymphatic: Stable atherosclerosis of the abdominal aorta and iliac arteries without evidence of aneurysm. No enlarged lymph nodes identified. Reproductive: Uterus and bilateral adnexa are unremarkable. Other: Stable bilateral inguinal hernias containing fat, right greater than left. No ascites. No new fluid collections. Musculoskeletal: No acute or significant osseous findings. IMPRESSION: 1. Resolution of suprahepatic/subdiaphragmatic abscess after percutaneous drain placement. 2. Stable trace right pleural fluid and small left pleural effusion. 3. Stable bilateral inguinal hernias containing fat, right greater than left. 4. Aortic atherosclerosis. Aortic Atherosclerosis (ICD10-I70.0). Electronically Signed   By: Irish Lack M.D.   On: 05/12/2020 11:01   Scheduled Meds: . clonazePAM  0.5 mg Oral QHS  . divalproex  250 mg Oral TID  . memantine  10 mg Oral BID  . QUEtiapine  12.5 mg Oral Daily  . QUEtiapine  25 mg Oral QHS  . sodium chloride flush  5 mL Intracatheter Q8H   Continuous Infusions: . sodium chloride 500 mL (05/11/20 1739)  . piperacillin-tazobactam (ZOSYN)  IV 3.375 g (05/12/20 0743)     LOS: 6 days    Time spent: 25 mins    Jahseh Lucchese, MD [facility]   If 7PM-7AM, please contact night-coverage

## 2020-05-13 LAB — COMPREHENSIVE METABOLIC PANEL
ALT: 27 U/L (ref 0–44)
AST: 18 U/L (ref 15–41)
Albumin: 2.7 g/dL — ABNORMAL LOW (ref 3.5–5.0)
Alkaline Phosphatase: 112 U/L (ref 38–126)
Anion gap: 11 (ref 5–15)
BUN: 13 mg/dL (ref 8–23)
CO2: 26 mmol/L (ref 22–32)
Calcium: 9 mg/dL (ref 8.9–10.3)
Chloride: 102 mmol/L (ref 98–111)
Creatinine, Ser: 1.11 mg/dL — ABNORMAL HIGH (ref 0.44–1.00)
GFR, Estimated: 49 mL/min — ABNORMAL LOW (ref >60)
Glucose, Bld: 113 mg/dL — ABNORMAL HIGH (ref 70–99)
Potassium: 3.4 mmol/L — ABNORMAL LOW (ref 3.5–5.1)
Sodium: 139 mmol/L (ref 135–145)
Total Bilirubin: 0.5 mg/dL (ref 0.3–1.2)
Total Protein: 6.5 g/dL (ref 6.5–8.1)

## 2020-05-13 LAB — CBC
HCT: 36.4 % (ref 36.0–46.0)
Hemoglobin: 11.9 g/dL — ABNORMAL LOW (ref 12.0–15.0)
MCH: 30.4 pg (ref 26.0–34.0)
MCHC: 32.7 g/dL (ref 30.0–36.0)
MCV: 92.9 fL (ref 80.0–100.0)
Platelets: 266 10*3/uL (ref 150–400)
RBC: 3.92 MIL/uL (ref 3.87–5.11)
RDW: 15.4 % (ref 11.5–15.5)
WBC: 7.3 10*3/uL (ref 4.0–10.5)
nRBC: 0 % (ref 0.0–0.2)

## 2020-05-13 LAB — GLUCOSE, CAPILLARY
Glucose-Capillary: 109 mg/dL — ABNORMAL HIGH (ref 70–99)
Glucose-Capillary: 70 mg/dL (ref 70–99)
Glucose-Capillary: 76 mg/dL (ref 70–99)
Glucose-Capillary: 84 mg/dL (ref 70–99)

## 2020-05-13 LAB — AEROBIC/ANAEROBIC CULTURE W GRAM STAIN (SURGICAL/DEEP WOUND): Special Requests: NORMAL

## 2020-05-13 LAB — PHOSPHORUS: Phosphorus: 3.4 mg/dL (ref 2.5–4.6)

## 2020-05-13 LAB — MAGNESIUM: Magnesium: 2.1 mg/dL (ref 1.7–2.4)

## 2020-05-13 MED ORDER — POTASSIUM CHLORIDE CRYS ER 20 MEQ PO TBCR
20.0000 meq | EXTENDED_RELEASE_TABLET | Freq: Once | ORAL | Status: AC
  Administered 2020-05-13: 16:00:00 20 meq via ORAL
  Filled 2020-05-13: qty 1

## 2020-05-13 NOTE — Progress Notes (Signed)
[facility]  PROGRESS NOTE  Sarah Larsen YSA:[id number] DOB: 14-Nov-1932 DOA: 05/06/2020 PCP: [name], MD   Brief HPI:   86 year old female, retired Psychologist, counselling with past medical history of dementia, liver abscess was sent to ED for evaluation for fever from memory care unit.  She was recently diagnosed with UTI and was started on antibiotics.  She also was found to have elevated liver enzymes in the ED.  CT abdomen/pelvis showed 10.7 cm fluid collection around the dome of the liver.  She was started on antibiotics.  IR was consulted.  She underwent abscess aspiration.    Subjective   Percutaneous drain was removed yesterday.  Patient denies any pain.  Repeat CT abdomen/pelvis done yesterday shows resolution of abscess.  Patient was started on IV Zosyn from 05/07/2020.  Abscess culture grew Pseudomonas which was sensitive to Zosyn.   Assessment/Plan:     1. Liver abscess-patient presented with abdominal discomfort and fever.  CT done showed 10.7 cm fluid collection around the dome of the liver.  I was consulted, patient underwent abscess aspiration on 05/07/2020.  Drain was placed.  Abscess culture grew Pseudomonas sensitive to Zosyn.  IR remove the drain yesterday, repeat CT abdomen/pelvis done yesterday showed resolution of abscess.  Called and discussed with ID, Dr. Margreta Journey.  Who does not feel that patient will need any further antibiotics as abscess has completely resolved and patient received 7 days of Zosyn in the hospital.  We will continue with Zosyn while patient is in the hospital. 2. Transaminitis-likely from fluid collection of the liver, LFTs are improving. 3. History of dementia-patient is on Depakote, Klonopin, Namenda and Seroquel at home.  She is confused at baseline.  High risk for delirium. 4. Acute kidney injury-resolved with IV fluids. 5. Hypokalemia-potassium was 3.4, will replace potassium and follow BMP in am. 6. Normocytic anemia-chronic.  Hemoglobin  is stable. 7. Thrombocytopenia-resolved     COVID-19 Labs  No results for input(s): DDIMER, FERRITIN, LDH, CRP in the last 72 hours.  Lab Results  Component Value Date   SARSCOV2NAA NEGATIVE 05/12/2020   SARSCOV2NAA NEGATIVE 05/06/2020     Scheduled medications:   . clonazePAM  0.5 mg Oral QHS  . divalproex  250 mg Oral TID  . memantine  10 mg Oral BID  . QUEtiapine  12.5 mg Oral Daily  . QUEtiapine  25 mg Oral QHS  . sodium chloride flush  5 mL Intracatheter Q8H         CBG: Recent Labs  Lab 05/12/20 1133 05/12/20 1718 05/13/20 0006 05/13/20 0646 05/13/20 1145  GLUCAP 101* 92 70 84 109*    SpO2: 94 % O2 Flow Rate (L/min): 2 L/min    CBC: Recent Labs  Lab 05/06/20 1937 05/08/20 0704 05/10/20 0511 05/12/20 0525 05/13/20 0516  WBC 6.9 6.0 6.4 7.5 7.3  NEUTROABS 5.3  --   --   --   --   HGB 11.3* 10.5* 10.5* 10.9* 11.9*  HCT 35.3* 32.4* 32.7* 33.9* 36.4  MCV 93.1 93.1 92.1 92.9 92.9  PLT 117* 138* 228 242 266    Basic Metabolic Panel: Recent Labs  Lab 05/09/20 0539 05/10/20 0511 05/11/20 0532 05/12/20 0525 05/13/20 0516  NA 139 142 139 141 139  K 3.8 3.6 3.7 3.5 3.4*  CL 105 105 103 103 102  CO2 24 30 26 27 26   GLUCOSE 83 88 88 87 113*  BUN 15 12 13 13 13   CREATININE 0.93 1.06* 0.96 1.09* 1.11*  CALCIUM 8.5* 8.5* 8.8* 8.7* 9.0  MG  --  2.0 2.2 2.2 2.1  PHOS  --  3.4 3.9 3.5 3.4     Liver Function Tests: Recent Labs  Lab 05/06/20 1937 05/08/20 0446 05/10/20 0511 05/13/20 0516  AST 66* 30 21 18   ALT 108* 64* 38 27  ALKPHOS 183* 152* 141* 112  BILITOT 0.5 0.6 0.6 0.5  PROT 6.2* 5.7* 5.7* 6.5  ALBUMIN 2.7* 2.4* 2.3* 2.7*     Antibiotics: Anti-infectives (From admission, onward)   Start     Dose/Rate Route Frequency Ordered Stop   05/07/20 0800  piperacillin-tazobactam (ZOSYN) IVPB 3.375 g        3.375 g 12.5 mL/hr over 240 Minutes Intravenous Every 8 hours 05/07/20 0051     05/06/20 2315  piperacillin-tazobactam (ZOSYN)  IVPB 3.375 g        3.375 g 100 mL/hr over 30 Minutes Intravenous  Once 05/06/20 2302 05/07/20 0015       DVT prophylaxis: SCDs  Code Status: Full code  Family Communication:  discussed with patient's daughter at bedside   Consultants:  IR  Procedures:  Drainage of the liver abscess    Objective   Vitals:   05/12/20 1330 05/12/20 2213 05/13/20 0542 05/13/20 1357  BP: (!) 120/58 128/66 118/66 (!) 119/56  Pulse: 78 81 74 75  Resp: 18 18 18 12   Temp: 97.8 F (36.6 C) 98.7 F (37.1 C) (!) 97.5 F (36.4 C) 98.7 F (37.1 C)  TempSrc: Oral   Oral  SpO2: 98% 97% 97% 94%  Weight:      Height:        Intake/Output Summary (Last 24 hours) at 05/13/2020 1420 Last data filed at 05/13/2020 1418 Gross per 24 hour  Intake 646.05 ml  Output 925 ml  Net -278.95 ml    12/17 1901 - 12/19 0700 In: 2454.1 [P.O.:1930; I.V.:244.2] Out: 2900 [Urine:2875; Drains:25]  Filed Weights   05/08/20 0730  Weight: 73.2 kg    Physical Examination:    General-appears in no acute distress  Heart-S1-S2, regular, no murmur auscultated  Lungs-clear to auscultation bilaterally, no wheezing or crackles auscultated  Abdomen-soft, nontender, no organomegaly  Extremities-no edema in the lower extremities  Neuro-alert, oriented x3, no focal deficit noted   Status is: Inpatient  Dispo: The patient is from: Skilled nursing facility              Anticipated d/c is to: Skilled nursing facility              Anticipated d/c date is: 05/14/2020              Patient currently medically stable for discharge  Barrier to discharge-awaiting placement at skilled nursing facility       Data Reviewed:   Recent Results (from the past 240 hour(s))  Urine culture     Status: None   Collection Time: 05/06/20  7:37 PM   Specimen: Urine, Catheterized  Result Value Ref Range Status   Specimen Description   Final    URINE, CATHETERIZED Performed at [facility], [address] [place], [place] [address]    Special Requests   Final    NONE Performed at [facility], [address] [place], [place] [address]    Culture   Final    NO GROWTH Performed at [facility], [address] [place], [place] [address]    Report Status 05/07/2020 FINAL  Final  Blood Culture (routine x 2)     Status: None   Collection Time: 05/06/20  7:37 PM   Specimen: BLOOD LEFT FOREARM  Result Value Ref Range Status   Specimen Description   Final    BLOOD LEFT FOREARM Performed at [facility], [address], [place], [place] [address]    Special Requests   Final    BOTTLES DRAWN AEROBIC AND ANAEROBIC Blood Culture adequate volume Performed at [facility], [address] [place], [place] [address]    Culture   Final    NO GROWTH 5 DAYS Performed at [facility], [address] [place], [place] [address]    Report Status 05/12/2020 FINAL  Final  Resp Panel by RT-PCR (Flu A&B, Covid) Nasopharyngeal Swab     Status: None   Collection Time: 05/06/20  7:37 PM   Specimen: Nasopharyngeal Swab; Nasopharyngeal(NP) swabs in vial transport medium  Result Value Ref Range Status   SARS Coronavirus 2 by RT PCR NEGATIVE NEGATIVE Final    Comment: (NOTE) SARS-CoV-2 target nucleic acids are NOT DETECTED.  The SARS-CoV-2 RNA is generally detectable in upper respiratory specimens during the acute phase of infection. The lowest concentration of SARS-CoV-2 viral copies this assay can detect is 138 copies/mL. A negative result does not preclude SARS-Cov-2 infection and should not be used as the sole basis for treatment or other patient management decisions. A negative result may occur with  improper specimen collection/handling, submission of specimen other than nasopharyngeal swab, presence of viral mutation(s) within the areas targeted by this assay, and inadequate number of viral copies(<138 copies/mL). A negative result  must be combined with clinical observations, patient history, and epidemiological information. The expected result is Negative.  Fact Sheet for Patients:  BloggerCourse.com  Fact Sheet for Healthcare Providers:  SeriousBroker.it  This test is no t yet approved or cleared by the [place] FDA and  has been authorized for detection and/or diagnosis of SARS-CoV-2 by FDA under an Emergency Use Authorization (EUA). This EUA will remain  in effect (meaning this test can be used) for the duration of the COVID-19 declaration under Section 564(b)(1) of the Act, 21 U.S.C.section 360bbb-3(b)(1), unless the authorization is terminated  or revoked sooner.       Influenza A by PCR NEGATIVE NEGATIVE Final   Influenza B by PCR NEGATIVE NEGATIVE Final    Comment: (NOTE) The Xpert Xpress SARS-CoV-2/FLU/RSV plus assay is intended as an aid in the diagnosis of influenza from Nasopharyngeal swab specimens and should not be used as a sole basis for treatment. Nasal washings and aspirates are unacceptable for Xpert Xpress SARS-CoV-2/FLU/RSV testing.  Fact Sheet for Patients: BloggerCourse.com  Fact Sheet for Healthcare Providers: SeriousBroker.it  This test is not yet approved or cleared by the [place] FDA and has been authorized for detection and/or diagnosis of SARS-CoV-2 by FDA under an Emergency Use Authorization (EUA). This EUA will remain in effect (meaning this test can be used) for the duration of the COVID-19 declaration under Section 564(b)(1) of the Act, 21 U.S.C. section 360bbb-3(b)(1), unless the authorization is terminated or revoked.  Performed at [facility], [address] [place], [place] [address]   Aerobic/Anaerobic Culture (surgical/deep wound)     Status: None (Preliminary result)   Collection Time: 05/07/20  2:58 PM   Specimen: Abscess   Result Value Ref Range Status   Specimen Description   Final    ABSCESS Performed at [facility], [address], [place], [place] [address]    Special Requests   Final    Normal Performed at [facility], [address], [place], [place] [address]    Gram Stain   Final    ABUNDANT WBC PRESENT,BOTH PMN AND MONONUCLEAR NO ORGANISMS SEEN    Culture   Final    FEW PSEUDOMONAS AERUGINOSA NO ANAEROBES ISOLATED Performed at [facility], [address] [place], [place] [address]    Report Status PENDING  Incomplete   Organism ID, Bacteria PSEUDOMONAS AERUGINOSA  Final      Susceptibility   Pseudomonas aeruginosa - MIC*    CEFTAZIDIME 4 SENSITIVE Sensitive     CIPROFLOXACIN <=0.25 SENSITIVE Sensitive     GENTAMICIN 2 SENSITIVE Sensitive     IMIPENEM <=0.25 SENSITIVE Sensitive     PIP/TAZO 8 SENSITIVE Sensitive     CEFEPIME 1 SENSITIVE Sensitive     * FEW PSEUDOMONAS AERUGINOSA  Resp Panel by RT-PCR (Flu A&B, Covid) Nasopharyngeal Swab     Status: None   Collection Time: 05/12/20 10:50 AM   Specimen: Nasopharyngeal Swab; Nasopharyngeal(NP) swabs in vial transport medium  Result Value Ref Range Status   SARS Coronavirus 2 by RT PCR NEGATIVE NEGATIVE Final    Comment: (NOTE) SARS-CoV-2 target nucleic acids are NOT DETECTED.  The SARS-CoV-2 RNA is generally detectable in upper respiratory specimens during the acute phase of infection. The lowest concentration of SARS-CoV-2 viral copies this assay can detect is 138 copies/mL. A negative result does not preclude SARS-Cov-2 infection and should not be used as the sole basis for treatment or other patient management decisions. A negative result may occur with  improper specimen collection/handling, submission of specimen other than nasopharyngeal swab, presence of viral mutation(s) within the areas targeted by this assay, and inadequate number of viral copies(<138 copies/mL). A negative  result must be combined with clinical observations, patient history, and epidemiological information. The expected result is Negative.  Fact Sheet for Patients:  BloggerCourse.com  Fact Sheet for Healthcare Providers:  SeriousBroker.it  This test is no t yet approved or cleared by the [place] FDA and  has been authorized for detection and/or diagnosis of SARS-CoV-2 by FDA under an Emergency Use Authorization (EUA). This EUA will remain  in effect (meaning this test can be used) for the duration of the COVID-19 declaration under Section 564(b)(1) of the Act, 21 U.S.C.section 360bbb-3(b)(1), unless the authorization is terminated  or revoked sooner.       Influenza A by PCR NEGATIVE NEGATIVE Final   Influenza B by PCR NEGATIVE NEGATIVE Final    Comment: (NOTE) The Xpert Xpress SARS-CoV-2/FLU/RSV plus assay is intended as an aid in the diagnosis of influenza from Nasopharyngeal swab specimens and should not be used as a sole basis for treatment. Nasal washings and aspirates are unacceptable for Xpert Xpress SARS-CoV-2/FLU/RSV testing.  Fact Sheet for Patients: BloggerCourse.com  Fact Sheet for Healthcare Providers: SeriousBroker.it  This test is not yet approved or cleared by the [place] FDA and has been authorized for detection and/or diagnosis of SARS-CoV-2 by FDA under an Emergency Use Authorization (EUA). This EUA will remain in effect (meaning this test can be used) for the duration of the COVID-19 declaration under Section 564(b)(1) of the Act, 21 U.S.C. section 360bbb-3(b)(1), unless the authorization is terminated or revoked.  Performed at [facility], [address] [place], [place] [address]      Studies:  CT ABDOMEN PELVIS  W CONTRAST  Result Date: 05/12/2020 CLINICAL DATA:  Status post percutaneous catheter drainage of abscess  superior to the dome of the liver and in a subdiaphragmatic location on 05/07/2020. EXAM: CT ABDOMEN AND PELVIS WITH CONTRAST TECHNIQUE: Multidetector CT imaging of the abdomen and pelvis was performed using the standard protocol following bolus administration of intravenous contrast. CONTRAST:  72mL OMNIPAQUE IOHEXOL 300 MG/ML  SOLN COMPARISON:  CT of the abdomen and pelvis on 05/06/2020 FINDINGS: Lower chest: Stable trace right pleural fluid and small left pleural effusion. Mild bibasilar atelectasis. Hepatobiliary: Suprahepatic percutaneous drain remains in place with resolution of the suprahepatic/subdiaphragmatic abscess. Trace fluid remains adjacent to the drainage catheter. The liver is unremarkable. The gallbladder has been removed. No biliary ductal dilatation identified. Pancreas: Unremarkable. No pancreatic ductal dilatation or surrounding inflammatory changes. Spleen: Normal in size without focal abnormality. Adrenals/Urinary Tract: Adrenal glands are unremarkable. Kidneys are normal, without renal calculi, focal lesion, or hydronephrosis. Bladder is unremarkable. Stomach/Bowel: Bowel shows no evidence of obstruction, inflammation or perforation. Vascular/Lymphatic: Stable atherosclerosis of the abdominal aorta and iliac arteries without evidence of aneurysm. No enlarged lymph nodes identified. Reproductive: Uterus and bilateral adnexa are unremarkable. Other: Stable bilateral inguinal hernias containing fat, right greater than left. No ascites. No new fluid collections. Musculoskeletal: No acute or significant osseous findings. IMPRESSION: 1. Resolution of suprahepatic/subdiaphragmatic abscess after percutaneous drain placement. 2. Stable trace right pleural fluid and small left pleural effusion. 3. Stable bilateral inguinal hernias containing fat, right greater than left. 4. Aortic atherosclerosis. Aortic Atherosclerosis (ICD10-I70.0). Electronically Signed   By: Irish Lack M.D.   On: 05/12/2020  11:01       Megham Dwyer S Samiah Ricklefs   [facility] If 7PM-7AM, please contact night-coverage at [email or web address], Office  [phone]   05/13/2020, 2:20 PM  LOS: 7 days

## 2020-05-14 LAB — GLUCOSE, CAPILLARY
Glucose-Capillary: 79 mg/dL (ref 70–99)
Glucose-Capillary: 89 mg/dL (ref 70–99)

## 2020-05-14 LAB — BASIC METABOLIC PANEL
Anion gap: 10 (ref 5–15)
BUN: 14 mg/dL (ref 8–23)
CO2: 25 mmol/L (ref 22–32)
Calcium: 9 mg/dL (ref 8.9–10.3)
Chloride: 104 mmol/L (ref 98–111)
Creatinine, Ser: 1.08 mg/dL — ABNORMAL HIGH (ref 0.44–1.00)
GFR, Estimated: 50 mL/min — ABNORMAL LOW (ref >60)
Glucose, Bld: 80 mg/dL (ref 70–99)
Potassium: 4 mmol/L (ref 3.5–5.1)
Sodium: 139 mmol/L (ref 135–145)

## 2020-05-14 MED ORDER — CLONAZEPAM 0.5 MG PO TABS: 0.5000 mg | ORAL_TABLET | Freq: Every day | ORAL | 0 refills | Status: DC

## 2020-05-14 NOTE — TOC Transition Note (Signed)
Transition of Care [facility]) - CM/SW Discharge Note   Patient Details  Name: Sarah Larsen MRN: [id number] Date of Birth: 1932/11/10  Transition of [facility]) CM/SW Contact:  Amada Jupiter, LCSW Phone Number: 05/14/2020, 11:25 AM   Clinical Narrative:    Pt medically cleared for dc today back to [facility]/ SNF. Pt and niece aware and agreeable.  [facility] has been called. No further TOC needs.   Final next level of care: Skilled Nursing Facility Barriers to Discharge: Barriers Resolved   Patient Goals and CMS Choice Patient states their goals for this hospitalization and ongoing recovery are:: return to SNF      Discharge Placement              Patient chooses bed at: [facility] Patient to be transferred to facility by: [facility] Name of family member notified: neice, [name] Patient and family notified of of transfer: 05/14/20  Discharge Plan and Services In-house Referral: Clinical Social Work   Post Acute Care Choice: Skilled Nursing Facility          DME Arranged: N/A DME Agency: NA                  Social Determinants of Health (SDOH) Interventions     Readmission Risk Interventions Readmission Risk Prevention Plan 05/07/2020  Post Dischage Appt Complete  Medication Screening Complete  Transportation Screening Complete

## 2020-05-14 NOTE — Discharge Summary (Signed)
Physician Discharge Summary  Sarah Larsen [id number]:[id number] DOB: [birth year] DOA: 05/06/2020  PCP: [name], MD  Admit date: 05/06/2020 Discharge date: 05/14/2020  Time spent: 50 minutes  Recommendations for Outpatient Follow-up:  1.   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  She was recently diagnosed with UTI and was started on antibiotics.  She also was found to have elevated liver enzymes in the ED.  CT abdomen/pelvis showed 10.7 cm fluid collection around the dome of the liver.  She was started on antibiotics.  IR was consulted.  She underwent abscess aspiration.  Hospital Course:  1. Liver abscess-patient presented with abdominal discomfort and fever.  CT done showed 10.7 cm fluid collection around the dome of the liver.  I was consulted, patient underwent abscess aspiration on 05/07/2020.  Drain was placed.  Abscess culture grew Pseudomonas sensitive to Zosyn.  IR remove the drain yesterday, repeat CT abdomen/pelvis done yesterday showed resolution of abscess.  Called and discussed with ID, Dr. Margreta Journey.  Who does not feel that patient will need any further antibiotics as abscess has completely resolved and patient received 7 days of Zosyn in the hospital.  No need of antibiotics at discharge. 2. Transaminitis-likely from fluid collection of the liver, LFTs are back to normal. 3. History of dementia-patient is on Depakote, Klonopin, Namenda and Seroquel at home.  She is confused at baseline.  High risk for delirium. 4. Acute kidney injury-resolved with IV  fluids. 5. Hypokalemia-replete, today potassium is 4.0. 6. Normocytic anemia-chronic.  Hemoglobin is stable. 7. Thrombocytopenia-resolved   Procedures:  Drainage of liver abscess  Consultations:  IR  Discharge Exam: Vitals:   05/13/20 2144 05/14/20 0538  BP: 129/66 117/62  Pulse: 79 73  Resp: 16 16  Temp: 98.3 F (36.8 C) 98 F (36.7 C)  SpO2: 94% 94%    General: Appears in no acute distress Cardiovascular: S1-S2, regular, no murmur auscultated Respiratory: Clear to auscultation bilaterally  Discharge Instructions   Discharge Instructions    Diet - low sodium heart healthy   Complete by: As directed    Increase activity slowly   Complete by: As directed    No wound care   Complete by: As directed      Allergies as of 05/14/2020      Reactions   Fish-derived Products    Pravachol [pravastatin Sodium]       Medication List    STOP taking these medications   nitrofurantoin (macrocrystal-monohydrate) 100 MG capsule Commonly known as: MACROBID     TAKE these medications   Acetaminophen 325 MG Caps Take 650 mg by mouth every 4 (four) hours as needed for fever. For fever over 100.6 for 48 hours. Do not exceed 3,000mg  in 24 hours   CALCIUM-VITAMIN D PO Take 1 tablet by mouth in the morning and at bedtime. 600mg  (1,500mg ) - 400 unit   clonazePAM 0.5 MG tablet Commonly known as: KLONOPIN Take 1 tablet (0.5 mg total) by mouth at bedtime for 5 doses.   divalproex 125 MG capsule Commonly known as: DEPAKOTE SPRINKLE Take 250 mg by mouth 2 (  two) times daily.   donepezil 10 MG tablet Commonly known as: ARICEPT Take 10 mg by mouth at bedtime.   memantine 10 MG tablet Commonly known as: NAMENDA Take 10 mg by mouth 2 (two) times daily.   POLYVINYL ALCOHOL-POVIDONE OP Place 2 drops into both eyes 3 (three) times daily. drops; 0.5-0.6 %; amt: 2 drops each eye; ophthalmic (eye)   QUEtiapine 25 MG tablet Commonly known as: SEROQUEL Take 12.5 mg by mouth  daily.   QUEtiapine 25 MG tablet Commonly known as: SEROQUEL Take 25 mg by mouth at bedtime.   saccharomyces boulardii 250 MG capsule Commonly known as: FLORASTOR Take 250 mg by mouth 2 (two) times daily. Pt is taking while on antibiotic   triamcinolone 0.1 % Commonly known as: KENALOG Apply 1 application topically 2 (two) times daily as needed (rash on ankles). Apply to rash on ankles twice a day until healed.      Allergies  Allergen Reactions  . Fish-Derived Products   . Pravachol [Pravastatin Sodium]     Contact information for after-discharge care    Destination    [facility] .   Service: Skilled Nursing Contact information: [address] [place] [address] [phone]                   The results of significant diagnostics from this hospitalization (including imaging, microbiology, ancillary and laboratory) are listed below for reference.    Significant Diagnostic Studies: CT Head Wo Contrast  Result Date: 05/02/2020 CLINICAL DATA:  Status post fall. EXAM: CT HEAD WITHOUT CONTRAST CT CERVICAL SPINE WITHOUT CONTRAST TECHNIQUE: Multidetector CT imaging of the head and cervical spine was performed following the standard protocol without intravenous contrast. Multiplanar CT image reconstructions of the cervical spine were also generated. COMPARISON:  None. FINDINGS: CT HEAD FINDINGS Brain: There is moderate severity cerebral atrophy with widening of the extra-axial spaces and ventricular dilatation. There are areas of decreased attenuation within the white matter tracts of the supratentorial brain, consistent with microvascular disease changes. Vascular: No hyperdense vessel or unexpected calcification. Skull: Normal. Negative for fracture or focal lesion. Sinuses/Orbits: No acute finding. Other: None. CT CERVICAL SPINE FINDINGS Alignment: Normal. Skull base and vertebrae: No acute fracture. No primary bone lesion or focal  pathologic process. Soft tissues and spinal canal: No prevertebral fluid or swelling. No visible canal hematoma. Disc levels: Marked severity endplate sclerosis is seen at the levels of C4-C5 and C5-C6, with mild anterior osteophyte formation noted at the levels of C2-C3 C6-C7 and C7-T1. Marked severity intervertebral disc space narrowing is seen at the levels of C4-C5 and C5-C6. Marked severity bilateral multilevel facet joint hypertrophy is noted. Upper chest: Mild biapical scarring and/or atelectasis is seen. Other: N/A IMPRESSION: 1. Moderate severity cerebral atrophy and microvascular disease changes of the supratentorial brain. 2. Marked severity degenerative changes of the cervical spine without evidence of an acute fracture or subluxation. Electronically Signed   By: Aram Candela M.D.   On: 05/02/2020 22:22   CT Cervical Spine Wo Contrast  Result Date: 05/02/2020 CLINICAL DATA:  Status post fall. EXAM: CT HEAD WITHOUT CONTRAST CT CERVICAL SPINE WITHOUT CONTRAST TECHNIQUE: Multidetector CT imaging of the head and cervical spine was performed following the standard protocol without intravenous contrast. Multiplanar CT image reconstructions of the cervical spine were also generated. COMPARISON:  None. FINDINGS: CT HEAD FINDINGS Brain: There is moderate severity cerebral atrophy with widening of the extra-axial spaces and ventricular dilatation. There are  areas of decreased attenuation within the white matter tracts of the supratentorial brain, consistent with microvascular disease changes. Vascular: No hyperdense vessel or unexpected calcification. Skull: Normal. Negative for fracture or focal lesion. Sinuses/Orbits: No acute finding. Other: None. CT CERVICAL SPINE FINDINGS Alignment: Normal. Skull base and vertebrae: No acute fracture. No primary bone lesion or focal pathologic process. Soft tissues and spinal canal: No prevertebral fluid or swelling. No visible canal hematoma. Disc levels: Marked  severity endplate sclerosis is seen at the levels of C4-C5 and C5-C6, with mild anterior osteophyte formation noted at the levels of C2-C3 C6-C7 and C7-T1. Marked severity intervertebral disc space narrowing is seen at the levels of C4-C5 and C5-C6. Marked severity bilateral multilevel facet joint hypertrophy is noted. Upper chest: Mild biapical scarring and/or atelectasis is seen. Other: N/A IMPRESSION: 1. Moderate severity cerebral atrophy and microvascular disease changes of the supratentorial brain. 2. Marked severity degenerative changes of the cervical spine without evidence of an acute fracture or subluxation. Electronically Signed   By: Aram Candelahaddeus  Houston M.D.   On: 05/02/2020 22:24   CT ABDOMEN PELVIS W CONTRAST  Result Date: 05/12/2020 CLINICAL DATA:  Status post percutaneous catheter drainage of abscess superior to the dome of the liver and in a subdiaphragmatic location on 05/07/2020. EXAM: CT ABDOMEN AND PELVIS WITH CONTRAST TECHNIQUE: Multidetector CT imaging of the abdomen and pelvis was performed using the standard protocol following bolus administration of intravenous contrast. CONTRAST:  80mL OMNIPAQUE IOHEXOL 300 MG/ML  SOLN COMPARISON:  CT of the abdomen and pelvis on 05/06/2020 FINDINGS: Lower chest: Stable trace right pleural fluid and small left pleural effusion. Mild bibasilar atelectasis. Hepatobiliary: Suprahepatic percutaneous drain remains in place with resolution of the suprahepatic/subdiaphragmatic abscess. Trace fluid remains adjacent to the drainage catheter. The liver is unremarkable. The gallbladder has been removed. No biliary ductal dilatation identified. Pancreas: Unremarkable. No pancreatic ductal dilatation or surrounding inflammatory changes. Spleen: Normal in size without focal abnormality. Adrenals/Urinary Tract: Adrenal glands are unremarkable. Kidneys are normal, without renal calculi, focal lesion, or hydronephrosis. Bladder is unremarkable. Stomach/Bowel: Bowel shows  no evidence of obstruction, inflammation or perforation. Vascular/Lymphatic: Stable atherosclerosis of the abdominal aorta and iliac arteries without evidence of aneurysm. No enlarged lymph nodes identified. Reproductive: Uterus and bilateral adnexa are unremarkable. Other: Stable bilateral inguinal hernias containing fat, right greater than left. No ascites. No new fluid collections. Musculoskeletal: No acute or significant osseous findings. IMPRESSION: 1. Resolution of suprahepatic/subdiaphragmatic abscess after percutaneous drain placement. 2. Stable trace right pleural fluid and small left pleural effusion. 3. Stable bilateral inguinal hernias containing fat, right greater than left. 4. Aortic atherosclerosis. Aortic Atherosclerosis (ICD10-I70.0). Electronically Signed   By: Irish LackGlenn  Yamagata M.D.   On: 05/12/2020 11:01   CT ABDOMEN PELVIS W CONTRAST  Result Date: 05/06/2020 CLINICAL DATA:  Abdominal pain EXAM: CT ABDOMEN AND PELVIS WITH CONTRAST TECHNIQUE: Multidetector CT imaging of the abdomen and pelvis was performed using the standard protocol following bolus administration of intravenous contrast. CONTRAST:  100mL OMNIPAQUE IOHEXOL 300 MG/ML  SOLN COMPARISON:  None. FINDINGS: Lower chest: Small bilateral pleural effusions. Bibasilar atelectasis. Hepatobiliary: No focal hepatic abnormality. There is a fluid collection noted along the dome of the liver. This appears to be subdiaphragmatic on the sagittal images. This may reflect a subcapsular fluid collection. This measures 10.7 x 7.6 x 4.8 cm. This also extends along the lateral surface of the upper liver. Prior cholecystectomy. Pancreas: No focal abnormality or ductal dilatation. Spleen: No focal abnormality.  Normal size. Adrenals/Urinary  Tract: No adrenal abnormality. No focal renal abnormality. No stones or hydronephrosis. Urinary bladder is unremarkable. Stomach/Bowel: Postoperative changes in the sigmoid colon. Colonic diverticulosis. No active  diverticulitis. Stomach and small bowel decompressed. Vascular/Lymphatic: Aortoiliac atherosclerosis. No evidence of aneurysm or adenopathy. Reproductive: Uterus and adnexa unremarkable.  No mass. Other: No free fluid or free air. Small bilateral inguinal hernias containing fat. Musculoskeletal: No acute bony abnormality. IMPRESSION: Large fluid collection along the dome of the liver, likely subcapsular fluid collection of unknown etiology. This measures up to 10.7 cm. Colonic diverticulosis.  No active diverticulitis. Small bilateral pleural effusions.  Bibasilar atelectasis. Aortoiliac atherosclerosis. Bilateral inguinal hernias containing fat. Electronically Signed   By: Charlett Nose M.D.   On: 05/06/2020 22:37   DG Chest Port 1 View  Result Date: 05/06/2020 CLINICAL DATA:  Questionable sepsis EXAM: PORTABLE CHEST 1 VIEW COMPARISON:  None. FINDINGS: Heart is normal size. Bibasilar atelectasis. No effusions. No acute bony abnormality. IMPRESSION: Bibasilar atelectasis. Electronically Signed   By: Charlett Nose M.D.   On: 05/06/2020 19:41   [place] IMAGE GUIDED FLUID DRAIN BY CATHETER  Result Date: 05/07/2020 INDICATION: Fluid collection superior to the dome of the liver and subdiaphragmatic in location. EXAM: ULTRASOUND-GUIDED PERCUTANEOUS CATHETER DRAINAGE PERIHEPATIC ABSCESS MEDICATIONS: No additional medications other than sedative medications documented below. ANESTHESIA/SEDATION: Fentanyl 25 mcg IV; Versed 0.5 mg IV Moderate Sedation Time:  12 minutes. The patient was continuously monitored during the procedure by the interventional radiology nurse under my direct supervision. COMPLICATIONS: None immediate. PROCEDURE: Informed written consent was obtained from the patient after a thorough discussion of the procedural risks, benefits and alternatives. All questions were addressed. Maximal Sterile Barrier Technique was utilized including caps, mask, sterile gowns, sterile gloves, sterile drape, hand hygiene  and skin antiseptic. A timeout was performed prior to the initiation of the procedure. Ultrasound was used to localize a fluid collection superior to the dome of the liver. Under direct ultrasound guidance, and following infiltration of 1% lidocaine for local anesthesia, a 5 [place] Yueh centesis catheter was inserted over a 19 gauge needle into the fluid collection from a lateral approach. Aspiration of fluid was performed. A 20 mL sample was collected and sent for culture analysis. The Yueh catheter was removed over a guidewire. The tract was dilated to 10 [place]. A 10 French percutaneous drainage catheter was then advanced into the collection. Catheter position was confirmed by ultrasound. The catheter was flushed with saline and connected to a suction bulb. It was secured at the skin with a Prolene retention suture and StatLock device. FINDINGS: Aspiration at the level of the lentiform shaped suprahepatic fluid collection yielded grossly purulent fluid. Due to nature of fluid, a percutaneous drainage catheter was placed. There is good return of fluid from the drain after placement and attachment to suction bulb drainage. IMPRESSION: Ultrasound-guided aspiration of the right-sided suprahepatic/infradiaphragmatic fluid collection yielded grossly purulent fluid. A sample was sent for culture analysis. A 10 French percutaneous drainage catheter was placed and attached to suction bulb drainage. Electronically Signed   By: Irish Lack M.D.   On: 05/07/2020 15:51    Microbiology: Recent Results (from the past 240 hour(s))  Urine culture     Status: None   Collection Time: 05/06/20  7:37 PM   Specimen: Urine, Catheterized  Result Value Ref Range Status   Specimen Description   Final    URINE, CATHETERIZED Performed at [facility], [address] [place], [place] [address]    Special Requests  Final    NONE Performed at [facility], [address],  [place], [place] [address]    Culture   Final    NO GROWTH Performed at [facility], [address] [place], [place] [address]    Report Status 05/07/2020 FINAL  Final  Blood Culture (routine x 2)     Status: None   Collection Time: 05/06/20  7:37 PM   Specimen: BLOOD LEFT FOREARM  Result Value Ref Range Status   Specimen Description   Final    BLOOD LEFT FOREARM Performed at [facility], [address], [place], [place] [address]    Special Requests   Final    BOTTLES DRAWN AEROBIC AND ANAEROBIC Blood Culture adequate volume Performed at [facility], [address], [place], [place] [address]    Culture   Final    NO GROWTH 5 DAYS Performed at [facility], [address] [place], [place] [address]    Report Status 05/12/2020 FINAL  Final  Resp Panel by RT-PCR (Flu A&B, Covid) Nasopharyngeal Swab     Status: None   Collection Time: 05/06/20  7:37 PM   Specimen: Nasopharyngeal Swab; Nasopharyngeal(NP) swabs in vial transport medium  Result Value Ref Range Status   SARS Coronavirus 2 by RT PCR NEGATIVE NEGATIVE Final    Comment: (NOTE) SARS-CoV-2 target nucleic acids are NOT DETECTED.  The SARS-CoV-2 RNA is generally detectable in upper respiratory specimens during the acute phase of infection. The lowest concentration of SARS-CoV-2 viral copies this assay can detect is 138 copies/mL. A negative result does not preclude SARS-Cov-2 infection and should not be used as the sole basis for treatment or other patient management decisions. A negative result may occur with  improper specimen collection/handling, submission of specimen other than nasopharyngeal swab, presence of viral mutation(s) within the areas targeted by this assay, and inadequate number of viral copies(<138 copies/mL). A negative result must be combined with clinical observations, patient history, and epidemiological information. The expected result is Negative.  Fact Sheet  for Patients:  BloggerCourse.com  Fact Sheet for Healthcare Providers:  SeriousBroker.it  This test is no t yet approved or cleared by the [place] FDA and  has been authorized for detection and/or diagnosis of SARS-CoV-2 by FDA under an Emergency Use Authorization (EUA). This EUA will remain  in effect (meaning this test can be used) for the duration of the COVID-19 declaration under Section 564(b)(1) of the Act, 21 U.S.C.section 360bbb-3(b)(1), unless the authorization is terminated  or revoked sooner.       Influenza A by PCR NEGATIVE NEGATIVE Final   Influenza B by PCR NEGATIVE NEGATIVE Final    Comment: (NOTE) The Xpert Xpress SARS-CoV-2/FLU/RSV plus assay is intended as an aid in the diagnosis of influenza from Nasopharyngeal swab specimens and should not be used as a sole basis for treatment. Nasal washings and aspirates are unacceptable for Xpert Xpress SARS-CoV-2/FLU/RSV testing.  Fact Sheet for Patients: BloggerCourse.com  Fact Sheet for Healthcare Providers: SeriousBroker.it  This test is not yet approved or cleared by the [place] FDA and has been authorized for detection and/or diagnosis of SARS-CoV-2 by FDA under an Emergency Use Authorization (EUA). This EUA will remain in effect (meaning this test can be used) for the duration of the COVID-19 declaration under Section 564(b)(1) of the Act, 21 U.S.C. section 360bbb-3(b)(1), unless the authorization is terminated or revoked.  Performed at [facility], [address] [place], [place] [address]   Aerobic/Anaerobic Culture (surgical/deep wound)     Status: None   Collection Time: 05/07/20  2:58 PM   Specimen: Abscess  Result Value Ref Range Status   Specimen Description   Final    ABSCESS Performed at [facility], [address] [place], [place] [address]     Special Requests   Final    Normal Performed at [facility], [address], [place], [place] [address]    Gram Stain   Final    ABUNDANT WBC PRESENT,BOTH PMN AND MONONUCLEAR NO ORGANISMS SEEN    Culture   Final    FEW PSEUDOMONAS AERUGINOSA NO ANAEROBES ISOLATED Performed at [facility], [address], [place], [place] [address]    Report Status 05/13/2020 FINAL  Final   Organism ID, Bacteria PSEUDOMONAS AERUGINOSA  Final      Susceptibility   Pseudomonas aeruginosa - MIC*    CEFTAZIDIME 4 SENSITIVE Sensitive     CIPROFLOXACIN <=0.25 SENSITIVE Sensitive     GENTAMICIN 2 SENSITIVE Sensitive     IMIPENEM <=0.25 SENSITIVE Sensitive     PIP/TAZO 8 SENSITIVE Sensitive     CEFEPIME 1 SENSITIVE Sensitive     * FEW PSEUDOMONAS AERUGINOSA  Resp Panel by RT-PCR (Flu A&B, Covid) Nasopharyngeal Swab     Status: None   Collection Time: 05/12/20 10:50 AM   Specimen: Nasopharyngeal Swab; Nasopharyngeal(NP) swabs in vial transport medium  Result Value Ref Range Status   SARS Coronavirus 2 by RT PCR NEGATIVE NEGATIVE Final    Comment: (NOTE) SARS-CoV-2 target nucleic acids are NOT DETECTED.  The SARS-CoV-2 RNA is generally detectable in upper respiratory specimens during the acute phase of infection. The lowest concentration of SARS-CoV-2 viral copies this assay can detect is 138 copies/mL. A negative result does not preclude SARS-Cov-2 infection and should not be used as the sole basis for treatment or other patient management decisions. A negative result may occur with  improper specimen collection/handling, submission of specimen other than nasopharyngeal swab, presence of viral mutation(s) within the areas targeted by this assay, and inadequate number of viral copies(<138 copies/mL). A negative result must be combined with clinical observations, patient history, and epidemiological information. The expected result is Negative.  Fact Sheet for Patients:   BloggerCourse.com  Fact Sheet for Healthcare Providers:  SeriousBroker.it  This test is no t yet approved or cleared by the [place] FDA and  has been authorized for detection and/or diagnosis of SARS-CoV-2 by FDA under an Emergency Use Authorization (EUA). This EUA will remain  in effect (meaning this test can be used) for the duration of the COVID-19 declaration under Section 564(b)(1) of the Act, 21 U.S.C.section 360bbb-3(b)(1), unless the authorization is terminated  or revoked sooner.       Influenza A by PCR NEGATIVE NEGATIVE Final   Influenza B by PCR NEGATIVE NEGATIVE Final    Comment: (NOTE) The Xpert Xpress SARS-CoV-2/FLU/RSV plus assay is intended as an aid in the diagnosis of influenza from Nasopharyngeal swab specimens and should not be used as a sole basis for treatment. Nasal washings and aspirates are unacceptable for Xpert Xpress SARS-CoV-2/FLU/RSV testing.  Fact Sheet for Patients: BloggerCourse.com  Fact Sheet for Healthcare Providers: SeriousBroker.it  This test is not yet approved or cleared by the [place] FDA and has been authorized for detection and/or diagnosis of SARS-CoV-2 by FDA under an Emergency Use Authorization (EUA). This EUA will remain in effect (meaning  this test can be used) for the duration of the COVID-19 declaration under Section 564(b)(1) of the Act, 21 U.S.C. section 360bbb-3(b)(1), unless the authorization is terminated or revoked.  Performed at [facility], [address], [place], [place] [address]      Labs: Basic Metabolic Panel: Recent Labs  Lab 05/10/20 0511 05/11/20 0532 05/12/20 0525 05/13/20 0516 05/14/20 0440  NA 142 139 141 139 139  K 3.6 3.7 3.5 3.4* 4.0  CL 105 103 103 102 104  CO2 GLUCOSE 88 88 87 113* 80  BUN CREATININE 1.06* 0.96 1.09* 1.11*  1.08*  CALCIUM 8.5* 8.8* 8.7* 9.0 9.0  MG 2.0 2.2 2.2 2.1  --   PHOS 3.4 3.9 3.5 3.4  --    Liver Function Tests: Recent Labs  Lab 05/08/20 0446 05/10/20 0511 05/13/20 0516  AST ALT 64* 38 27  ALKPHOS 152* 141* 112  BILITOT 0.6 0.6 0.5  PROT 5.7* 5.7* 6.5  ALBUMIN 2.4* 2.3* 2.7*   No results for input(s): LIPASE, AMYLASE in the last 168 hours. No results for input(s): AMMONIA in the last 168 hours. CBC: Recent Labs  Lab 05/08/20 0704 05/10/20 0511 05/12/20 0525 05/13/20 0516  WBC 6.0 6.4 7.5 7.3  HGB 10.5* 10.5* 10.9* 11.9*  HCT 32.4* 32.7* 33.9* 36.4  MCV 93.1 92.1 92.9 92.9  PLT 138* 228 242 266    CBG: Recent Labs  Lab 05/13/20 0646 05/13/20 1145 05/13/20 1739 05/13/20 2351 05/14/20 0615  GLUCAP 84 109* 76 79 89       Signed:  Meredeth Ide MD.  [facility] 05/14/2020, 10:54 AM

## 2020-05-14 NOTE — Progress Notes (Signed)
Pharmacy Antibiotic Note  Sarah Larsen is a 86 y.o. female admitted on 05/06/2020 with Intra-abd infection.  Pharmacy has been consulted for Zosyn dosing.  Plan: Continue Zosyn 3.375g IV q8h (4 hour infusion). Recommend to complete 7 day course per MD notes Pharmacy will sign off consult.   Height: 5\' 6"  (167.6 cm) Weight: 73.2 kg (161 lb 6.4 oz) IBW/kg (Calculated) : 59.3  Temp (24hrs), Avg:98.3 F (36.8 C), Min:98 F (36.7 C), Max:98.7 F (37.1 C)  Recent Labs  Lab 05/08/20 0704 05/09/20 0539 05/10/20 0511 05/11/20 0532 05/12/20 0525 05/13/20 0516 05/14/20 0440  WBC 6.0  --  6.4  --  7.5 7.3  --   CREATININE  --    < > 1.06* 0.96 1.09* 1.11* 1.08*   < > = values in this interval not displayed.    Estimated Creatinine Clearance: 39 mL/min (A) (by C-G formula based on SCr of 1.08 mg/dL (H)).    Allergies  Allergen Reactions  . Fish-Derived Products   . Pravachol [Pravastatin Sodium]     Antimicrobials this admission: 12/12 Zosyn >>   Dose adjustments this admission:  Microbiology results: 12/12 BCx x1: ngtd 12/12 UCx: NGF  12/13 Abscess: few Pseudomonas ( pansens)   Thank you for allowing pharmacy to be a part of this patient's care.  1/14 PharmD, BCPS Clinical Pharmacist [facility] main pharmacy [phone] 05/14/2020 8:23 AM

## 2020-05-14 NOTE — Progress Notes (Signed)
Report given to [facility] @ [facility]. Discharge instructions were given to facility. All questions were answered. Patient was transported to facility by [facility].

## 2020-05-15 ENCOUNTER — Encounter: Payer: Self-pay | Admitting: Nurse Practitioner

## 2020-05-15 ENCOUNTER — Non-Acute Institutional Stay (SKILLED_NURSING_FACILITY): Payer: Medicare Other | Admitting: Nurse Practitioner

## 2020-05-15 DIAGNOSIS — F418 Other specified anxiety disorders: Secondary | ICD-10-CM

## 2020-05-15 DIAGNOSIS — F01518 Vascular dementia, unspecified severity, with other behavioral disturbance: Secondary | ICD-10-CM

## 2020-05-15 DIAGNOSIS — R7989 Other specified abnormal findings of blood chemistry: Secondary | ICD-10-CM | POA: Diagnosis not present

## 2020-05-15 DIAGNOSIS — F0151 Vascular dementia with behavioral disturbance: Secondary | ICD-10-CM

## 2020-05-15 DIAGNOSIS — R188 Other ascites: Secondary | ICD-10-CM

## 2020-05-15 DIAGNOSIS — E871 Hypo-osmolality and hyponatremia: Secondary | ICD-10-CM

## 2020-05-15 MED FILL — Sodium Chloride IV Soln 0.9%: INTRAVENOUS | Qty: 1000 | Status: AC

## 2020-05-15 MED FILL — Ceftriaxone Sodium For Inj 1 GM: INTRAMUSCULAR | Qty: 1 | Status: AC

## 2020-05-15 NOTE — Assessment & Plan Note (Addendum)
Anxiety/depression, takes Seroquel, will dc Depakote since the patient has no behavioral issues and her appetite is poor, Clonazepam

## 2020-05-15 NOTE — Assessment & Plan Note (Signed)
Hospitalized 05/06/20-05/14/20 for fever. She was found to have elevated liver enzymes in ED which was normalized upon dc, CT abd showed 10.7u cm fluid collectin around the dome of the liver, repeated CT abd showed resolution following  underwent abscess aspiration 05/07/20 and removed drain 05/13/20, treated with 7 day course of Zosyn for liver abscess, ID no further tx or f/u needed.   

## 2020-05-15 NOTE — Assessment & Plan Note (Signed)
Dementia, resides in memory care unit [facility], takes Memantine, Donepezil ° °

## 2020-05-15 NOTE — Progress Notes (Signed)
Location:   [facility] Room Number: 103 Place of Service:  SNF (31) Provider:  Sydell Axon, Arna Snipe NP  [name], MD  Patient Care Team: [name], MD as PCP - General (Internal Medicine)  Extended Emergency Contact Information Primary Emergency Contact: Ramiro Harvest [facility]) [facility] Address: [address]          [place], [place] [address] [name] Phone: [phone] Relation: Relative Secondary Emergency Contact: Marlowe Kays Mobile Phone: [phone] Relation: Relative  Code Status:  DNR Goals of care: Advanced Directive information Advanced Directives 05/07/2020  Does Patient Have a Medical Advance Directive? Yes  Type of Advance Directive Out of facility DNR (pink MOST or yellow form)  Does patient want to make changes to medical advance directive? No - Patient declined  Copy of Healthcare Power of Attorney in Chart? -  Pre-existing out of facility DNR order (yellow form or pink MOST form) Yellow form placed in chart (order not valid for inpatient use)     Chief Complaint  Patient presents with  . Acute Visit    Medication review    HPI:  Pt is a 86 y.o. female seen today for an acute visit for review medications  Hospitalized 05/06/20-05/14/20 for fever. She was found to have elevated liver enzymes in ED which was normalized upon dc, CT abd showed 10.7u cm fluid collectin around the dome of the liver, repeated CT abd showed resolution following  underwent abscess aspiration 05/07/20 and removed drain 05/13/20, treated with 7 day course of Zosyn for liver abscess, ID no further tx or f/u needed.   Dementia, resides in memory care unit [facility], takes Memantine, Donepezil Anxiety/depression, takes Seroquel, Depakote, Clonazepam Elevated TSH 03/28/20 TSH 5.78, repeat TSH in 6 weeks  Hyponatremia, Na 139, Bun 14, creat 1.08 05/14/20  Past Medical History:  Diagnosis Date  . Arthritis   .  Dementia (HCC)   . Hx of concussion    [facility]  . Hyperlipidemia   . Vitamin D deficiency, unspecified    Past Surgical History:  Procedure Laterality Date  . CHOLECYSTECTOMY      Allergies  Allergen Reactions  . Fish-Derived Products   . Pravachol [Pravastatin Sodium]     Allergies as of 05/15/2020      Reactions   Fish-derived Products    Pravachol [pravastatin Sodium]       Medication List       Accurate as of May 15, 2020 11:59 PM. If you have any questions, ask your nurse or doctor.        STOP taking these medications   Acetaminophen 325 MG Caps Stopped by: Shrika Milos X Ayomide Purdy, NP   saccharomyces boulardii 250 MG capsule Commonly known as: FLORASTOR Stopped by: Keegan Ducey X Wiley Magan, NP     TAKE these medications   CALCIUM-VITAMIN D PO Take 1 tablet by mouth in the morning and at bedtime.  (1,500mg ) - 400 unit   clonazePAM 0.5 MG tablet Commonly known as: KLONOPIN Take 1 tablet (0.5 mg total) by mouth at bedtime for 5 doses.   divalproex 125 MG capsule Commonly known as: DEPAKOTE SPRINKLE Take 250 mg by mouth 2 (two) times daily.   donepezil 10 MG tablet Commonly known as: ARICEPT Take 10 mg by mouth at bedtime.   memantine 10 MG tablet Commonly known as: NAMENDA Take 10 mg by mouth 2 (two) times daily.   POLYVINYL ALCOHOL-POVIDONE OP Place 2 drops into both eyes 3 (three)  times daily. drops; 0.5-0.6 %; amt: 2 drops each eye; ophthalmic (eye)   QUEtiapine 25 MG tablet Commonly known as: SEROQUEL Take 12.5 mg by mouth daily.   QUEtiapine 25 MG tablet Commonly known as: SEROQUEL Take 25 mg by mouth at bedtime.   triamcinolone 0.1 % Commonly known as: KENALOG Apply 1 application topically 2 (two) times daily as needed (rash on ankles). Apply to rash on ankles twice a day until healed.       Review of Systems  Constitutional: Positive for activity change, appetite change and fatigue. Negative for fever.       Weight  gained about #5-6 Ibs in the past month  HENT: Positive for hearing loss. Negative for congestion and voice change.   Eyes: Negative for visual disturbance.       Dry eyes  Respiratory: Negative for cough and shortness of breath.   Cardiovascular: Negative for leg swelling.  Gastrointestinal: Negative for abdominal pain and constipation.  Genitourinary: Negative for dysuria and urgency.  Musculoskeletal: Positive for arthralgias and gait problem.       Right shoulder pain is improved.   Skin: Negative for color change.  Neurological: Negative for facial asymmetry, speech difficulty and weakness.       Dementia  Psychiatric/Behavioral: Positive for confusion. Negative for agitation, behavioral problems and sleep disturbance. The patient is not nervous/anxious.        Fatigue.     Immunization History  Administered Date(s) Administered  . Influenza-Unspecified 02/26/2019, 03/07/2020  . Moderna Sars-Covid-2 Vaccination 05/28/2019, 06/25/2019  . Pneumococcal-Unspecified 05/22/2016   Pertinent  Health Maintenance Due  Topic Date Due  . DEXA SCAN  Never done  . PNA vac Low Risk Adult (2 of 2 - PCV13) 05/22/2017  . INFLUENZA VACCINE  Completed   No flowsheet data found. Functional Status Survey:    Vitals:   05/15/20 0818  BP: 130/68  Pulse: 86  Resp: 18  Temp: 98.3 F (36.8 C)  SpO2: 93%  Weight: 156 lb 6.4 oz (70.9 kg)  Height:  (1.676 m)   Body mass index is 25.24 kg/m. Physical Exam Vitals and nursing note reviewed.  Constitutional:      Appearance: Normal appearance.  HENT:     Head: Normocephalic and atraumatic.     Mouth/Throat:     Mouth: Mucous membranes are dry.  Eyes:     Extraocular Movements: Extraocular movements intact.     Conjunctiva/sclera: Conjunctivae normal.     Pupils: Pupils are equal, round, and reactive to light.  Cardiovascular:     Rate and Rhythm: Normal rate and regular rhythm.     Heart sounds: No murmur heard.   Pulmonary:      Effort: Pulmonary effort is normal.     Breath sounds: No rales.  Abdominal:     Palpations: Abdomen is soft.     Tenderness: There is no abdominal tenderness.  Musculoskeletal:     Cervical back: Normal range of motion and neck supple.     Right lower leg: No edema.     Left lower leg: No edema.  Skin:    General: Skin is warm and dry.  Neurological:     General: No focal deficit present.     Mental Status: She is alert.     Gait: Gait abnormal.     Comments: Oriented to person, requested to use bathroom during my examination.   Psychiatric:     Comments: Confused, pleasant, conversed     Labs  reviewed: Recent Labs    05/11/20 0532 05/12/20 0525 05/13/20 0516 05/14/20 0440  NA 139 141 139 139  K 3.7 3.5 3.4* 4.0  CL 103 103 102 104  CO2 26 27 26 25   GLUCOSE 88 87 113* 80  BUN 13 13 13 14   CREATININE 0.96 1.09* 1.11* 1.08*  CALCIUM 8.8* 8.7* 9.0 9.0  MG 2.2 2.2 2.1  --   PHOS 3.9 3.5 3.4  --    Recent Labs    05/08/20 0446 05/10/20 0511 05/13/20 0516  AST 30 21 18   ALT 64* 38 27  ALKPHOS 152* 141* 112  BILITOT 0.6 0.6 0.5  PROT 5.7* 5.7* 6.5  ALBUMIN 2.4* 2.3* 2.7*   Recent Labs    03/28/20 0000 03/28/20 0000 05/02/20 2225 05/06/20 1937 05/08/20 0704 05/10/20 0511 05/12/20 0525 05/13/20 0516  WBC 5.4   < > 7.3 6.9   < > 6.4 7.5 7.3  NEUTROABS 3,202.00  --  6.5 5.3  --   --   --   --   HGB 13.2  --  11.4* 11.3*   < > 10.5* 10.9* 11.9*  HCT 40  --  36.1 35.3*   < > 32.7* 33.9* 36.4  MCV  --    < > 93.0 93.1   < > 92.1 92.9 92.9  PLT 199  --  132* 117*   < > 228 242 266   < > = values in this interval not displayed.   Lab Results  Component Value Date   TSH 5.78 03/28/2020   No results found for: HGBA1C No results found for: CHOL, HDL, LDLCALC, LDLDIRECT, TRIG, CHOLHDL  Significant Diagnostic Results in last 30 days:  CT Head Wo Contrast  Result Date: 05/02/2020 CLINICAL DATA:  Status post fall. EXAM: CT HEAD WITHOUT CONTRAST CT CERVICAL  SPINE WITHOUT CONTRAST TECHNIQUE: Multidetector CT imaging of the head and cervical spine was performed following the standard protocol without intravenous contrast. Multiplanar CT image reconstructions of the cervical spine were also generated. COMPARISON:  None. FINDINGS: CT HEAD FINDINGS Brain: There is moderate severity cerebral atrophy with widening of the extra-axial spaces and ventricular dilatation. There are areas of decreased attenuation within the white matter tracts of the supratentorial brain, consistent with microvascular disease changes. Vascular: No hyperdense vessel or unexpected calcification. Skull: Normal. Negative for fracture or focal lesion. Sinuses/Orbits: No acute finding. Other: None. CT CERVICAL SPINE FINDINGS Alignment: Normal. Skull base and vertebrae: No acute fracture. No primary bone lesion or focal pathologic process. Soft tissues and spinal canal: No prevertebral fluid or swelling. No visible canal hematoma. Disc levels: Marked severity endplate sclerosis is seen at the levels of C4-C5 and C5-C6, with mild anterior osteophyte formation noted at the levels of C2-C3 C6-C7 and C7-T1. Marked severity intervertebral disc space narrowing is seen at the levels of C4-C5 and C5-C6. Marked severity bilateral multilevel facet joint hypertrophy is noted. Upper chest: Mild biapical scarring and/or atelectasis is seen. Other: N/A IMPRESSION: 1. Moderate severity cerebral atrophy and microvascular disease changes of the supratentorial brain. 2. Marked severity degenerative changes of the cervical spine without evidence of an acute fracture or subluxation. Electronically Signed   By: 05/15/20 M.D.   On: 05/02/2020 22:22   CT Cervical Spine Wo Contrast  Result Date: 05/02/2020 CLINICAL DATA:  Status post fall. EXAM: CT HEAD WITHOUT CONTRAST CT CERVICAL SPINE WITHOUT CONTRAST TECHNIQUE: Multidetector CT imaging of the head and cervical spine was performed following the standard protocol  without intravenous contrast. Multiplanar CT image reconstructions of the cervical spine were also generated. COMPARISON:  None. FINDINGS: CT HEAD FINDINGS Brain: There is moderate severity cerebral atrophy with widening of the extra-axial spaces and ventricular dilatation. There are areas of decreased attenuation within the white matter tracts of the supratentorial brain, consistent with microvascular disease changes. Vascular: No hyperdense vessel or unexpected calcification. Skull: Normal. Negative for fracture or focal lesion. Sinuses/Orbits: No acute finding. Other: None. CT CERVICAL SPINE FINDINGS Alignment: Normal. Skull base and vertebrae: No acute fracture. No primary bone lesion or focal pathologic process. Soft tissues and spinal canal: No prevertebral fluid or swelling. No visible canal hematoma. Disc levels: Marked severity endplate sclerosis is seen at the levels of C4-C5 and C5-C6, with mild anterior osteophyte formation noted at the levels of C2-C3 C6-C7 and C7-T1. Marked severity intervertebral disc space narrowing is seen at the levels of C4-C5 and C5-C6. Marked severity bilateral multilevel facet joint hypertrophy is noted. Upper chest: Mild biapical scarring and/or atelectasis is seen. Other: N/A IMPRESSION: 1. Moderate severity cerebral atrophy and microvascular disease changes of the supratentorial brain. 2. Marked severity degenerative changes of the cervical spine without evidence of an acute fracture or subluxation. Electronically Signed   By: Aram Candelahaddeus  Houston M.D.   On: 05/02/2020 22:24   CT ABDOMEN PELVIS W CONTRAST  Result Date: 05/12/2020 CLINICAL DATA:  Status post percutaneous catheter drainage of abscess superior to the dome of the liver and in a subdiaphragmatic location on 05/07/2020. EXAM: CT ABDOMEN AND PELVIS WITH CONTRAST TECHNIQUE: Multidetector CT imaging of the abdomen and pelvis was performed using the standard protocol following bolus administration of intravenous  contrast. CONTRAST:  80mL OMNIPAQUE IOHEXOL 300 MG/ML  SOLN COMPARISON:  CT of the abdomen and pelvis on 05/06/2020 FINDINGS: Lower chest: Stable trace right pleural fluid and small left pleural effusion. Mild bibasilar atelectasis. Hepatobiliary: Suprahepatic percutaneous drain remains in place with resolution of the suprahepatic/subdiaphragmatic abscess. Trace fluid remains adjacent to the drainage catheter. The liver is unremarkable. The gallbladder has been removed. No biliary ductal dilatation identified. Pancreas: Unremarkable. No pancreatic ductal dilatation or surrounding inflammatory changes. Spleen: Normal in size without focal abnormality. Adrenals/Urinary Tract: Adrenal glands are unremarkable. Kidneys are normal, without renal calculi, focal lesion, or hydronephrosis. Bladder is unremarkable. Stomach/Bowel: Bowel shows no evidence of obstruction, inflammation or perforation. Vascular/Lymphatic: Stable atherosclerosis of the abdominal aorta and iliac arteries without evidence of aneurysm. No enlarged lymph nodes identified. Reproductive: Uterus and bilateral adnexa are unremarkable. Other: Stable bilateral inguinal hernias containing fat, right greater than left. No ascites. No new fluid collections. Musculoskeletal: No acute or significant osseous findings. IMPRESSION: 1. Resolution of suprahepatic/subdiaphragmatic abscess after percutaneous drain placement. 2. Stable trace right pleural fluid and small left pleural effusion. 3. Stable bilateral inguinal hernias containing fat, right greater than left. 4. Aortic atherosclerosis. Aortic Atherosclerosis (ICD10-I70.0). Electronically Signed   By: Irish LackGlenn  Yamagata M.D.   On: 05/12/2020 11:01   CT ABDOMEN PELVIS W CONTRAST  Result Date: 05/06/2020 CLINICAL DATA:  Abdominal pain EXAM: CT ABDOMEN AND PELVIS WITH CONTRAST TECHNIQUE: Multidetector CT imaging of the abdomen and pelvis was performed using the standard protocol following bolus administration of  intravenous contrast. CONTRAST:  100mL OMNIPAQUE IOHEXOL 300 MG/ML  SOLN COMPARISON:  None. FINDINGS: Lower chest: Small bilateral pleural effusions. Bibasilar atelectasis. Hepatobiliary: No focal hepatic abnormality. There is a fluid collection noted along the dome of the liver. This appears to be subdiaphragmatic on the sagittal images. This may reflect a  subcapsular fluid collection. This measures 10.7 x 7.6 x 4.8 cm. This also extends along the lateral surface of the upper liver. Prior cholecystectomy. Pancreas: No focal abnormality or ductal dilatation. Spleen: No focal abnormality.  Normal size. Adrenals/Urinary Tract: No adrenal abnormality. No focal renal abnormality. No stones or hydronephrosis. Urinary bladder is unremarkable. Stomach/Bowel: Postoperative changes in the sigmoid colon. Colonic diverticulosis. No active diverticulitis. Stomach and small bowel decompressed. Vascular/Lymphatic: Aortoiliac atherosclerosis. No evidence of aneurysm or adenopathy. Reproductive: Uterus and adnexa unremarkable.  No mass. Other: No free fluid or free air. Small bilateral inguinal hernias containing fat. Musculoskeletal: No acute bony abnormality. IMPRESSION: Large fluid collection along the dome of the liver, likely subcapsular fluid collection of unknown etiology. This measures up to 10.7 cm. Colonic diverticulosis.  No active diverticulitis. Small bilateral pleural effusions.  Bibasilar atelectasis. Aortoiliac atherosclerosis. Bilateral inguinal hernias containing fat. Electronically Signed   By: Charlett Nose M.D.   On: 05/06/2020 22:37   DG Chest Port 1 View  Result Date: 05/06/2020 CLINICAL DATA:  Questionable sepsis EXAM: PORTABLE CHEST 1 VIEW COMPARISON:  None. FINDINGS: Heart is normal size. Bibasilar atelectasis. No effusions. No acute bony abnormality. IMPRESSION: Bibasilar atelectasis. Electronically Signed   By: Charlett Nose M.D.   On: 05/06/2020 19:41   [place] IMAGE GUIDED FLUID DRAIN BY  CATHETER  Result Date: 05/07/2020 INDICATION: Fluid collection superior to the dome of the liver and subdiaphragmatic in location. EXAM: ULTRASOUND-GUIDED PERCUTANEOUS CATHETER DRAINAGE PERIHEPATIC ABSCESS MEDICATIONS: No additional medications other than sedative medications documented below. ANESTHESIA/SEDATION: Fentanyl 25 mcg IV; Versed 0.5 mg IV Moderate Sedation Time:  12 minutes. The patient was continuously monitored during the procedure by the interventional radiology nurse under my direct supervision. COMPLICATIONS: None immediate. PROCEDURE: Informed written consent was obtained from the patient after a thorough discussion of the procedural risks, benefits and alternatives. All questions were addressed. Maximal Sterile Barrier Technique was utilized including caps, mask, sterile gowns, sterile gloves, sterile drape, hand hygiene and skin antiseptic. A timeout was performed prior to the initiation of the procedure. Ultrasound was used to localize a fluid collection superior to the dome of the liver. Under direct ultrasound guidance, and following infiltration of 1% lidocaine for local anesthesia, a 5 [place] Yueh centesis catheter was inserted over a 19 gauge needle into the fluid collection from a lateral approach. Aspiration of fluid was performed. A 20 mL sample was collected and sent for culture analysis. The Yueh catheter was removed over a guidewire. The tract was dilated to 10 [place]. A 10 French percutaneous drainage catheter was then advanced into the collection. Catheter position was confirmed by ultrasound. The catheter was flushed with saline and connected to a suction bulb. It was secured at the skin with a Prolene retention suture and StatLock device. FINDINGS: Aspiration at the level of the lentiform shaped suprahepatic fluid collection yielded grossly purulent fluid. Due to nature of fluid, a percutaneous drainage catheter was placed. There is good return of fluid from the drain after  placement and attachment to suction bulb drainage. IMPRESSION: Ultrasound-guided aspiration of the right-sided suprahepatic/infradiaphragmatic fluid collection yielded grossly purulent fluid. A sample was sent for culture analysis. A 10 French percutaneous drainage catheter was placed and attached to suction bulb drainage. Electronically Signed   By: Irish Lack M.D.   On: 05/07/2020 15:51    Assessment/Plan Intraabdominal fluid collection Hospitalized 05/06/20-05/14/20 for fever. She was found to have elevated liver enzymes in ED which was normalized upon dc, CT abd showed 10.7u  cm fluid collectin around the dome of the liver, repeated CT abd showed resolution following  underwent abscess aspiration 05/07/20 and removed drain 05/13/20, treated with 7 day course of Zosyn for liver abscess, ID no further tx or f/u needed.    Vascular dementia (HCC) Dementia, resides in memory care unit [facility], takes Memantine, Donepezil  Depression with anxiety Anxiety/depression, takes Seroquel, will dc Depakote since the patient has no behavioral issues and her appetite is poor, Clonazepam  Elevated TSH Elevated TSH 03/28/20 TSH 5.78, repeat TSH in 6 weeks   Hyponatremia Hyponatremia, Na 139, Bun 14, creat 1.08 05/14/20     Family/ staff Communication: plan of care reviewed with the patient and charge nurse.   Labs/tests ordered:  none  Time spend 35 minutes.

## 2020-05-15 NOTE — Assessment & Plan Note (Signed)
Hyponatremia, Na 139, Bun 14, creat 1.08 05/14/20  

## 2020-05-15 NOTE — Assessment & Plan Note (Signed)
Elevated TSH 03/28/20 TSH 5.78, repeat TSH in 6 weeks 

## 2020-05-17 ENCOUNTER — Encounter: Payer: Self-pay | Admitting: Nurse Practitioner

## 2020-05-21 ENCOUNTER — Encounter: Payer: Self-pay | Admitting: Internal Medicine

## 2020-05-21 ENCOUNTER — Non-Acute Institutional Stay (SKILLED_NURSING_FACILITY): Payer: Medicare Other | Admitting: Internal Medicine

## 2020-05-21 DIAGNOSIS — E871 Hypo-osmolality and hyponatremia: Secondary | ICD-10-CM | POA: Diagnosis not present

## 2020-05-21 DIAGNOSIS — F418 Other specified anxiety disorders: Secondary | ICD-10-CM

## 2020-05-21 DIAGNOSIS — R2681 Unsteadiness on feet: Secondary | ICD-10-CM

## 2020-05-21 DIAGNOSIS — F02818 Dementia in other diseases classified elsewhere, unspecified severity, with other behavioral disturbance: Secondary | ICD-10-CM

## 2020-05-21 DIAGNOSIS — K750 Abscess of liver: Secondary | ICD-10-CM

## 2020-05-21 DIAGNOSIS — G301 Alzheimer's disease with late onset: Secondary | ICD-10-CM

## 2020-05-21 DIAGNOSIS — F0281 Dementia in other diseases classified elsewhere with behavioral disturbance: Secondary | ICD-10-CM

## 2020-05-21 DIAGNOSIS — R531 Weakness: Secondary | ICD-10-CM

## 2020-05-21 DIAGNOSIS — R7989 Other specified abnormal findings of blood chemistry: Secondary | ICD-10-CM | POA: Diagnosis not present

## 2020-05-21 NOTE — Progress Notes (Signed)
Provider:  Einar Crow MD Location:   [facility] Room Number: [id number] Place of Service:  SNF ([phone])  PCP: [name], MD Patient Care Team: [name], MD as PCP - General (Internal Medicine)  Extended Emergency Contact Information Primary Emergency Contact: Ramiro Harvest, [facility] Address: [address]          [place], [place] [address] [name] of [place] Phone: [phone] Relation: Relative Secondary Emergency Contact: Marlowe Kays Mobile Phone: [phone] Relation: Relative  Code Status: DNR Goals of Care: Advanced Directive information Advanced Directives 05/07/2020  Does Patient Have a Medical Advance Directive? Yes  Type of Advance Directive Out of facility DNR (pink MOST or yellow form)  Does patient want to make changes to medical advance directive? No - Patient declined  Copy of Healthcare Power of Attorney in Chart? -  Pre-existing out of facility DNR order (yellow form or pink MOST form) Yellow form placed in chart (order not valid for inpatient use)      Chief Complaint  Patient presents with  . New Admit To SNF    Admission to SNF    HPI: Patient is a 86 y.o. female seen today for Readmission to SNF for Long term Care   Admitted to the hospital from 12/12-12/20 for Intraabdominal Abscess/ Liver abscess   Patient has h/odementiano recentimaging, hyperlipidemia, history of unstable gait  She was send to the hospital for Unexplained Fever . In ED Liver enzymes were elevated CT scan showed Fluid Collection around dome of Liver IR was consulted. Underwent Liver Abscess Aspiration on 05/07/20 Drain was placed. Abscess Grew Pseudomonas Was treated with Zosyn for 7 days.  Infectious disease no further antibiotics was necessary repeat CT scan done after the removal of drain showed resolution of abscess.  Since patient has been back she has been more weak.  She was taken off the Depakote.  Prior to nursing care  she has been more sleepy.  Also talked to therapy and they said that she is needing more support to walk.  Patient was unable to give me much history due to her dementia.  She denies any abdominal pain nausea vomiting.  Her appetite has been poor.   Past Medical History:  Diagnosis Date  . Arthritis   . Dementia (HCC)   . Hx of concussion    [facility]  . Hyperlipidemia   . Vitamin D deficiency, unspecified    Past Surgical History:  Procedure Laterality Date  . CHOLECYSTECTOMY      reports that she has never smoked. She has never used smokeless tobacco. She reports that she does not drink alcohol and does not use drugs. Social History   Socioeconomic History  . Marital status: Single    Spouse name: Not on file  . Number of children: Not on file  . Years of education: Not on file  . Highest education level: Not on file  Occupational History  . Not on file  Tobacco Use  . Smoking status: Never Smoker  . Smokeless tobacco: Never Used  Vaping Use  . Vaping Use: Never used  Substance and Sexual Activity  . Alcohol use: Never  . Drug use: Never  . Sexual activity: Never  Other Topics Concern  . Not on file  Social History Narrative  . Not on file   Social Determinants of Health   Financial Resource Strain: Not on file  Food Insecurity: Not on file  Transportation Needs: Not on  file  Physical Activity: Not on file  Stress: Not on file  Social Connections: Not on file  Intimate Partner Violence: Not on file    Functional Status Survey:    Family History  Family history unknown: Yes    Health Maintenance  Topic Date Due  . Janet Berlin  Never done  . DEXA SCAN  Never done  . PNA vac Low Risk Adult (2 of 2 - PCV13) 05/22/2017  . COVID-19 Vaccine (3 - Booster for Moderna series) 12/23/2019  . INFLUENZA VACCINE  Completed    Allergies  Allergen Reactions  . Fish-Derived Products   . Pravachol [Pravastatin Sodium]     Allergies as  of 05/21/2020      Reactions   Fish-derived Products    Pravachol [pravastatin Sodium]       Medication List       Accurate as of May 21, 2020 10:18 AM. If you have any questions, ask your nurse or doctor.        STOP taking these medications   divalproex 125 MG capsule Commonly known as: DEPAKOTE SPRINKLE Stopped by: [name], MD     TAKE these medications   CALCIUM-VITAMIN D PO Take 1 tablet by mouth in the morning and at bedtime. 600mg  (1,500mg ) - 400 unit   clonazePAM 0.5 MG tablet Commonly known as: KLONOPIN Take 1 tablet (0.5 mg total) by mouth at bedtime for 5 doses.   donepezil 10 MG tablet Commonly known as: ARICEPT Take 10 mg by mouth at bedtime.   memantine 10 MG tablet Commonly known as: NAMENDA Take 10 mg by mouth 2 (two) times daily.   POLYVINYL ALCOHOL-POVIDONE OP Place 2 drops into both eyes 3 (three) times daily. drops; 0.5-0.6 %; amt: 2 drops each eye; ophthalmic (eye)   QUEtiapine 25 MG tablet Commonly known as: SEROQUEL Take 12.5 mg by mouth daily.   QUEtiapine 25 MG tablet Commonly known as: SEROQUEL Take 25 mg by mouth at bedtime.   triamcinolone 0.1 % Commonly known as: KENALOG Apply 1 application topically 2 (two) times daily as needed (rash on ankles). Apply to rash on ankles twice a day until healed.       Review of Systems  Unable to perform ROS: Dementia    Vitals:   05/21/20 1003  BP: 124/70  Pulse: 77  Resp: 18  Temp: 98 F (36.7 C)  SpO2: 92%  Weight: 155 lb 12.8 oz (70.7 kg)  Height: 5\' 6"  (1.676 m)   Body mass index is 25.15 kg/m. Physical Exam  Constitutional: . Well-developed and well-nourished. Seemed Sleepy HENT:  Head: Normocephalic.  Mouth/Throat: Oropharynx is clear and moist.  Eyes: Pupils are equal, round, and reactive to light.  Neck: Neck supple.  Cardiovascular: Normal rate and normal heart sounds.  No murmur heard. Pulmonary/Chest: Effort normal and breath sounds normal. No  respiratory distress. No wheezes. She has no rales.  Abdominal: Soft. Bowel sounds are normal. No distension. Mild tender in RUQ Musculoskeletal: No edema.  Lymphadenopathy: none Neurological: No Focal Deficits.  Skin: Skin is warm and dry.  Psychiatric: Normal mood and affect. Behavior is normal. Thought content normal.    Labs reviewed: Basic Metabolic Panel: Recent Labs    05/11/20 0532 05/12/20 0525 05/13/20 0516 05/14/20 0440  NA 139 141 139 139  K 3.7 3.5 3.4* 4.0  CL 103 103 102 104  CO2 26 27 26 25   GLUCOSE 88 87 113* 80  BUN 13 13 13 14   CREATININE  0.96 1.09* 1.11* 1.08*  CALCIUM 8.8* 8.7* 9.0 9.0  MG 2.2 2.2 2.1  --   PHOS 3.9 3.5 3.4  --    Liver Function Tests: Recent Labs    05/08/20 0446 05/10/20 0511 05/13/20 0516  AST 30 21 18   ALT 64* 38 27  ALKPHOS 152* 141* 112  BILITOT 0.6 0.6 0.5  PROT 5.7* 5.7* 6.5  ALBUMIN 2.4* 2.3* 2.7*   No results for input(s): LIPASE, AMYLASE in the last 8760 hours. No results for input(s): AMMONIA in the last 8760 hours. CBC: Recent Labs    03/28/20 0000 03/28/20 0000 05/02/20 2225 05/06/20 1937 05/08/20 0704 05/10/20 0511 05/12/20 0525 05/13/20 0516  WBC 5.4   < > 7.3 6.9   < > 6.4 7.5 7.3  NEUTROABS 3,202.00  --  6.5 5.3  --   --   --   --   HGB 13.2  --  11.4* 11.3*   < > 10.5* 10.9* 11.9*  HCT 40  --  36.1 35.3*   < > 32.7* 33.9* 36.4  MCV  --    < > 93.0 93.1   < > 92.1 92.9 92.9  PLT 199  --  132* 117*   < > 228 242 266   < > = values in this interval not displayed.   Cardiac Enzymes: No results for input(s): CKTOTAL, CKMB, CKMBINDEX, TROPONINI in the last 8760 hours. BNP: Invalid input(s): POCBNP No results found for: HGBA1C Lab Results  Component Value Date   TSH 5.78 03/28/2020   Lab Results  Component Value Date   VITAMINB12 466 07/12/2019   No results found for: FOLATE No results found for: IRON, TIBC, FERRITIN  Imaging and Procedures obtained prior to SNF admission: CT ABDOMEN  PELVIS W CONTRAST  Result Date: 05/06/2020 CLINICAL DATA:  Abdominal pain EXAM: CT ABDOMEN AND PELVIS WITH CONTRAST TECHNIQUE: Multidetector CT imaging of the abdomen and pelvis was performed using the standard protocol following bolus administration of intravenous contrast. CONTRAST:  100mL OMNIPAQUE IOHEXOL 300 MG/ML  SOLN COMPARISON:  None. FINDINGS: Lower chest: Small bilateral pleural effusions. Bibasilar atelectasis. Hepatobiliary: No focal hepatic abnormality. There is a fluid collection noted along the dome of the liver. This appears to be subdiaphragmatic on the sagittal images. This may reflect a subcapsular fluid collection. This measures 10.7 x 7.6 x 4.8 cm. This also extends along the lateral surface of the upper liver. Prior cholecystectomy. Pancreas: No focal abnormality or ductal dilatation. Spleen: No focal abnormality.  Normal size. Adrenals/Urinary Tract: No adrenal abnormality. No focal renal abnormality. No stones or hydronephrosis. Urinary bladder is unremarkable. Stomach/Bowel: Postoperative changes in the sigmoid colon. Colonic diverticulosis. No active diverticulitis. Stomach and small bowel decompressed. Vascular/Lymphatic: Aortoiliac atherosclerosis. No evidence of aneurysm or adenopathy. Reproductive: Uterus and adnexa unremarkable.  No mass. Other: No free fluid or free air. Small bilateral inguinal hernias containing fat. Musculoskeletal: No acute bony abnormality. IMPRESSION: Large fluid collection along the dome of the liver, likely subcapsular fluid collection of unknown etiology. This measures up to 10.7 cm. Colonic diverticulosis.  No active diverticulitis. Small bilateral pleural effusions.  Bibasilar atelectasis. Aortoiliac atherosclerosis. Bilateral inguinal hernias containing fat. Electronically Signed   By: Charlett NoseKevin  Dover M.D.   On: 05/06/2020 22:37   DG Chest Port 1 View  Result Date: 05/06/2020 CLINICAL DATA:  Questionable sepsis EXAM: PORTABLE CHEST 1 VIEW COMPARISON:   None. FINDINGS: Heart is normal size. Bibasilar atelectasis. No effusions. No acute bony abnormality. IMPRESSION: Bibasilar atelectasis. Electronically  Signed   By: Charlett Nose M.D.   On: 05/06/2020 19:41   [place] IMAGE GUIDED FLUID DRAIN BY CATHETER  Result Date: 05/07/2020 INDICATION: Fluid collection superior to the dome of the liver and subdiaphragmatic in location. EXAM: ULTRASOUND-GUIDED PERCUTANEOUS CATHETER DRAINAGE PERIHEPATIC ABSCESS MEDICATIONS: No additional medications other than sedative medications documented below. ANESTHESIA/SEDATION: Fentanyl 25 mcg IV; Versed 0.5 mg IV Moderate Sedation Time:  12 minutes. The patient was continuously monitored during the procedure by the interventional radiology nurse under my direct supervision. COMPLICATIONS: None immediate. PROCEDURE: Informed written consent was obtained from the patient after a thorough discussion of the procedural risks, benefits and alternatives. All questions were addressed. Maximal Sterile Barrier Technique was utilized including caps, mask, sterile gowns, sterile gloves, sterile drape, hand hygiene and skin antiseptic. A timeout was performed prior to the initiation of the procedure. Ultrasound was used to localize a fluid collection superior to the dome of the liver. Under direct ultrasound guidance, and following infiltration of 1% lidocaine for local anesthesia, a 5 [place] Yueh centesis catheter was inserted over a 19 gauge needle into the fluid collection from a lateral approach. Aspiration of fluid was performed. A 20 mL sample was collected and sent for culture analysis. The Yueh catheter was removed over a guidewire. The tract was dilated to 10 [place]. A 10 French percutaneous drainage catheter was then advanced into the collection. Catheter position was confirmed by ultrasound. The catheter was flushed with saline and connected to a suction bulb. It was secured at the skin with a Prolene retention suture and StatLock device.  FINDINGS: Aspiration at the level of the lentiform shaped suprahepatic fluid collection yielded grossly purulent fluid. Due to nature of fluid, a percutaneous drainage catheter was placed. There is good return of fluid from the drain after placement and attachment to suction bulb drainage. IMPRESSION: Ultrasound-guided aspiration of the right-sided suprahepatic/infradiaphragmatic fluid collection yielded grossly purulent fluid. A sample was sent for culture analysis. A 10 French percutaneous drainage catheter was placed and attached to suction bulb drainage. Electronically Signed   By: Irish Lack M.D.   On: 05/07/2020 15:51    Assessment/Plan Liver abscess Antibiotics for 7 days for Pseudomonas Per ID No more treamtnet Drains removed ? Etiology Repeat Liver Function  Depression with anxiety On Klonipin  Elevated TSH Will Repeat in Few weeks Hyponatremia Repeat BMP Generalized weakness Working with therapy Unsteady gait per therapy needing more support to walk She was independent before   Late onset Alzheimer's dementia with behavioral disturbance (HCC) On Aricept and Namenda Will discontinue Seroquel as she is more sleepy per Staff Start on PRN Seroquel 12.5 mg QHS prn     Total time spent in this patient care encounter was  45_  minutes; greater than 50% of the visit spent counseling  staff, reviewing records , Labs and coordinating care for problems addressed at this encounter.   Family/ staff Communication:   Labs/tests ordered: CBC,CMP

## 2020-05-22 ENCOUNTER — Encounter (HOSPITAL_COMMUNITY): Payer: Self-pay

## 2020-05-22 ENCOUNTER — Emergency Department (HOSPITAL_COMMUNITY): Payer: Medicare Other

## 2020-05-22 ENCOUNTER — Other Ambulatory Visit: Payer: Self-pay

## 2020-05-22 ENCOUNTER — Emergency Department (HOSPITAL_COMMUNITY)
Admission: EM | Admit: 2020-05-22 | Discharge: 2020-05-23 | Disposition: A | Discharge status: Home / Self Care | Payer: Medicare Other | Attending: Emergency Medicine | Admitting: Emergency Medicine

## 2020-05-22 DIAGNOSIS — Z79899 Other long term (current) drug therapy: Secondary | ICD-10-CM | POA: Insufficient documentation

## 2020-05-22 DIAGNOSIS — S0990XA Unspecified injury of head, initial encounter: Secondary | ICD-10-CM | POA: Insufficient documentation

## 2020-05-22 DIAGNOSIS — S022XXA Fracture of nasal bones, initial encounter for closed fracture: Secondary | ICD-10-CM | POA: Insufficient documentation

## 2020-05-22 DIAGNOSIS — S99921A Unspecified injury of right foot, initial encounter: Secondary | ICD-10-CM | POA: Diagnosis present

## 2020-05-22 DIAGNOSIS — W19XXXA Unspecified fall, initial encounter: Secondary | ICD-10-CM | POA: Insufficient documentation

## 2020-05-22 DIAGNOSIS — S92514A Nondisplaced fracture of proximal phalanx of right lesser toe(s), initial encounter for closed fracture: Secondary | ICD-10-CM | POA: Diagnosis not present

## 2020-05-22 DIAGNOSIS — F0150 Vascular dementia without behavioral disturbance: Secondary | ICD-10-CM | POA: Insufficient documentation

## 2020-05-22 NOTE — Discharge Instructions (Addendum)
Imaging today with nasal bone fractures and fractures of right 3rd and 4th toes. Tylenol or motrin for pain. Can follow-up with ENT about nose and orthopedics about foot. Return here for new concerns.

## 2020-05-22 NOTE — ED Provider Notes (Signed)
[facility]-EMERGENCY DEPT Provider Note   CSN: [id number] Arrival date & time: 05/22/20  1938     History Chief Complaint  Patient presents with   Sarah Larsen    Sarah Larsen is a 86 y.o. female.  The history is provided by the patient and medical records.  Fall   LEVEL V CAVEAT: DEMENTIA  86 y.o. F with hx of arthritis, dementia, HLP, depression, vascular dementia, presenting to the ED following a fall.  Patient states she does not remember exactly how or why she fell, but states she was told she was face down on the floor.  Unsure of LOC.  She states she does have some pain in her nose and had a nosebleed, but mostly complains of right foot pain (particularly in the toes).  She is not currently on anticoagulation.  Previously was walking independently but now used cane/walker at her facility after admission earlier this month.  Past Medical History:  Diagnosis Date   Arthritis    Dementia (HCC)    Hx of concussion    [facility]   Hyperlipidemia    Vitamin D deficiency, unspecified     Patient Active Problem List   Diagnosis Date Noted   Intraabdominal fluid collection 05/07/2020   Fever 05/06/2020   Acute renal injury (HCC) 05/03/2020   Generalized weakness 05/03/2020   UTI (urinary tract infection) 05/03/2020   Elevated TSH 03/28/2020   Weight gain 03/22/2020   Hyponatremia 01/18/2020   Depression with anxiety 01/05/2020   Fall 12/28/2019   Dry eyes 11/09/2019   Dizziness 08/24/2019   Agitation 08/24/2019   Vascular dementia (HCC) 01/15/2019   Hyperlipidemia 01/15/2019   Vitamin D insufficiency 01/15/2019   Unsteady gait 01/15/2019    Past Surgical History:  Procedure Laterality Date   CHOLECYSTECTOMY       OB History   No obstetric history on file.     Family History  Family history unknown: Yes    Social History   Tobacco Use   Smoking status: Never Smoker   Smokeless tobacco:  Never Used  Vaping Use   Vaping Use: Never used  Substance Use Topics   Alcohol use: Never   Drug use: Never    Home Medications Prior to Admission medications   Medication Sig Start Date End Date Taking? Authorizing Provider  CALCIUM-VITAMIN D PO Take 1 tablet by mouth in the morning and at bedtime. 600mg  (1,500mg ) - 400 unit    [provider]  clonazePAM (KLONOPIN) 0.5 MG tablet Take 1 tablet (0.5 mg total) by mouth at bedtime for 5 doses. 05/14/20 05/19/20  05/21/20, MD  donepezil (ARICEPT) 10 MG tablet Take 10 mg by mouth at bedtime.     [provider]  memantine (NAMENDA) 10 MG tablet Take 10 mg by mouth 2 (two) times daily.    [provider]  POLYVINYL ALCOHOL-POVIDONE OP Place 2 drops into both eyes 3 (three) times daily. drops; 0.5-0.6 %; amt: 2 drops each eye; ophthalmic (eye)    [provider]  QUEtiapine (SEROQUEL) 25 MG tablet Take 12.5 mg by mouth daily.     [provider]  triamcinolone cream (KENALOG) 0.1 % Apply 1 application topically 2 (two) times daily as needed (rash on ankles). Apply to rash on ankles twice a day until healed.    [provider]    Allergies    Fish-derived products and Pravachol [pravastatin sodium]  Review of Systems   Review of  Systems  Unable to perform ROS: Dementia    Physical Exam Updated Vital Signs BP 120/72    Pulse 99    Temp 97.7 F (36.5 C) (Oral)    Resp 18    SpO2 95%   Physical Exam Vitals and nursing note reviewed.  Constitutional:      Appearance: She is well-developed and well-nourished.     Comments: Awake, alert, elderly, NAD  HENT:     Head: Normocephalic and atraumatic.     Nose: Nose normal.     Comments: Contusion noted to forehead between eyebrows, swelling and bruising of this area extending down over the bridge of nose, no tenderness elicited with palpation, there is some dried blood in nostrils (left > right), no septal hematoma or deformity  present    Mouth/Throat:     Mouth: Oropharynx is clear and moist.  Eyes:     Extraocular Movements: EOM normal.     Conjunctiva/sclera: Conjunctivae normal.     Pupils: Pupils are equal, round, and reactive to light.  Cardiovascular:     Rate and Rhythm: Normal rate and regular rhythm.     Heart sounds: Normal heart sounds.  Pulmonary:     Effort: Pulmonary effort is normal.     Breath sounds: Normal breath sounds. No stridor. No wheezing.  Abdominal:     General: Bowel sounds are normal.     Palpations: Abdomen is soft.     Tenderness: There is no abdominal tenderness. There is no rebound.  Musculoskeletal:        General: Normal range of motion.     Cervical back: Normal range of motion.     Comments: Tenderness along dorsal distal right foot including toes, DP pulse intact, moving toes as normal  Skin:    General: Skin is warm and dry.  Neurological:     Mental Status: She is alert.     Comments: Awake, alert, oriented to self and situation, moving arms and legs well when prompted, no focal deficits noted  Psychiatric:        Mood and Affect: Mood and affect normal.     ED Results / Procedures / Treatments   Labs (all labs ordered are listed, but only abnormal results are displayed) Labs Reviewed - No data to display  EKG None  Radiology CT Head Wo Contrast  Result Date: 05/22/2020 CLINICAL DATA:  Facial trauma status post unwitnessed fall. EXAM: CT HEAD WITHOUT CONTRAST CT MAXILLOFACIAL WITHOUT CONTRAST CT CERVICAL SPINE WITHOUT CONTRAST TECHNIQUE: Multidetector CT imaging of the head, cervical spine, and maxillofacial structures were performed using the standard protocol without intravenous contrast. Multiplanar CT image reconstructions of the cervical spine and maxillofacial structures were also generated. COMPARISON:  CT head 05/02/2020. FINDINGS: CT HEAD FINDINGS Brain: Cerebral ventricle sizes are concordant with the degree of cerebral volume loss. Patchy and  confluent areas of decreased attenuation are noted throughout the deep and periventricular white matter of the cerebral hemispheres bilaterally, compatible with chronic microvascular ischemic disease. No evidence of large-territorial acute infarction. Hyperdense foci within the right frontal lobe is noted to be artifactual (6:16, 6:29-30). No parenchymal hemorrhage. No mass lesion. No extra-axial collection. No mass effect or midline shift. No hydrocephalus. Basilar cisterns are patent. Vascular: No hyperdense vessel. Skull: No acute fracture or focal lesion. Other: None. CT MAXILLOFACIAL FINDINGS Osseous: Acute comminuted and minimally displaced bilateral nasal bone fractures. No other acute displaced fracture the face. Sinuses/Orbits: Paranasal sinuses and mastoid air cells are clear. The  orbits are unremarkable. Soft tissues: Associated mild midline frontal and nasal subcutaneus soft tissue edema. CT CERVICAL SPINE FINDINGS Alignment: Normal. Skull base and vertebrae: Multilevel degenerative change of the spine worse at the C4 through C6 levels with intervertebral disc space narrowing, osteophyte formation, mild facet arthropathy, mild uncovertebral arthropathy. No acute fracture. No aggressive appearing focal osseous lesion or focal pathologic process. Soft tissues and spinal canal: No prevertebral fluid or swelling. No visible canal hematoma. Disc levels:  Maintained. Upper chest: Biapical pleural/pulmonary scarring. Other: Suggestion of a right posterior tracheal diverticula. IMPRESSION: 1. No acute intracranial abnormality. 2. Acute comminuted and minimally displaced bilateral nasal bone fracture. 3. No acute displaced fracture or traumatic listhesis of the lumbar spine. Electronically Signed   By: Tish Frederickson M.D.   On: 05/22/2020 23:18   CT Cervical Spine Wo Contrast  Result Date: 05/22/2020 CLINICAL DATA:  Facial trauma status post unwitnessed fall. EXAM: CT HEAD WITHOUT CONTRAST CT MAXILLOFACIAL  WITHOUT CONTRAST CT CERVICAL SPINE WITHOUT CONTRAST TECHNIQUE: Multidetector CT imaging of the head, cervical spine, and maxillofacial structures were performed using the standard protocol without intravenous contrast. Multiplanar CT image reconstructions of the cervical spine and maxillofacial structures were also generated. COMPARISON:  CT head 05/02/2020. FINDINGS: CT HEAD FINDINGS Brain: Cerebral ventricle sizes are concordant with the degree of cerebral volume loss. Patchy and confluent areas of decreased attenuation are noted throughout the deep and periventricular white matter of the cerebral hemispheres bilaterally, compatible with chronic microvascular ischemic disease. No evidence of large-territorial acute infarction. Hyperdense foci within the right frontal lobe is noted to be artifactual (6:16, 6:29-30). No parenchymal hemorrhage. No mass lesion. No extra-axial collection. No mass effect or midline shift. No hydrocephalus. Basilar cisterns are patent. Vascular: No hyperdense vessel. Skull: No acute fracture or focal lesion. Other: None. CT MAXILLOFACIAL FINDINGS Osseous: Acute comminuted and minimally displaced bilateral nasal bone fractures. No other acute displaced fracture the face. Sinuses/Orbits: Paranasal sinuses and mastoid air cells are clear. The orbits are unremarkable. Soft tissues: Associated mild midline frontal and nasal subcutaneus soft tissue edema. CT CERVICAL SPINE FINDINGS Alignment: Normal. Skull base and vertebrae: Multilevel degenerative change of the spine worse at the C4 through C6 levels with intervertebral disc space narrowing, osteophyte formation, mild facet arthropathy, mild uncovertebral arthropathy. No acute fracture. No aggressive appearing focal osseous lesion or focal pathologic process. Soft tissues and spinal canal: No prevertebral fluid or swelling. No visible canal hematoma. Disc levels:  Maintained. Upper chest: Biapical pleural/pulmonary scarring. Other:  Suggestion of a right posterior tracheal diverticula. IMPRESSION: 1. No acute intracranial abnormality. 2. Acute comminuted and minimally displaced bilateral nasal bone fracture. 3. No acute displaced fracture or traumatic listhesis of the lumbar spine. Electronically Signed   By: Tish Frederickson M.D.   On: 05/22/2020 23:18   DG Foot Complete Right  Result Date: 05/22/2020 CLINICAL DATA:  Unwitnessed fall at nursing home.  Foot pain. EXAM: RIGHT FOOT COMPLETE - 3+ VIEW COMPARISON:  None. FINDINGS: Suspected nondisplaced fracture of the fourth toe proximal phalanx. Possible fracture of the third toe proximal phalanx. No intra-articular involvement of either fracture. Slight hammertoe deformity of the digits. Osteoarthritis of the first metatarsal phalangeal joint. No erosion or bony destruction. Incidental os navicular. No focal soft tissue abnormalities are seen. IMPRESSION: 1. Suspected nondisplaced fracture of the fourth toe proximal phalanx and possible fracture of the third toe proximal phalanx. 2. Osteoarthritis of the first metatarsophalangeal joint. Electronically Signed   By: Narda Rutherford M.D.   On:  05/22/2020 22:45   CT Maxillofacial Wo Contrast  Result Date: 05/22/2020 CLINICAL DATA:  Facial trauma status post unwitnessed fall. EXAM: CT HEAD WITHOUT CONTRAST CT MAXILLOFACIAL WITHOUT CONTRAST CT CERVICAL SPINE WITHOUT CONTRAST TECHNIQUE: Multidetector CT imaging of the head, cervical spine, and maxillofacial structures were performed using the standard protocol without intravenous contrast. Multiplanar CT image reconstructions of the cervical spine and maxillofacial structures were also generated. COMPARISON:  CT head 05/02/2020. FINDINGS: CT HEAD FINDINGS Brain: Cerebral ventricle sizes are concordant with the degree of cerebral volume loss. Patchy and confluent areas of decreased attenuation are noted throughout the deep and periventricular white matter of the cerebral hemispheres  bilaterally, compatible with chronic microvascular ischemic disease. No evidence of large-territorial acute infarction. Hyperdense foci within the right frontal lobe is noted to be artifactual (6:16, 6:29-30). No parenchymal hemorrhage. No mass lesion. No extra-axial collection. No mass effect or midline shift. No hydrocephalus. Basilar cisterns are patent. Vascular: No hyperdense vessel. Skull: No acute fracture or focal lesion. Other: None. CT MAXILLOFACIAL FINDINGS Osseous: Acute comminuted and minimally displaced bilateral nasal bone fractures. No other acute displaced fracture the face. Sinuses/Orbits: Paranasal sinuses and mastoid air cells are clear. The orbits are unremarkable. Soft tissues: Associated mild midline frontal and nasal subcutaneus soft tissue edema. CT CERVICAL SPINE FINDINGS Alignment: Normal. Skull base and vertebrae: Multilevel degenerative change of the spine worse at the C4 through C6 levels with intervertebral disc space narrowing, osteophyte formation, mild facet arthropathy, mild uncovertebral arthropathy. No acute fracture. No aggressive appearing focal osseous lesion or focal pathologic process. Soft tissues and spinal canal: No prevertebral fluid or swelling. No visible canal hematoma. Disc levels:  Maintained. Upper chest: Biapical pleural/pulmonary scarring. Other: Suggestion of a right posterior tracheal diverticula. IMPRESSION: 1. No acute intracranial abnormality. 2. Acute comminuted and minimally displaced bilateral nasal bone fracture. 3. No acute displaced fracture or traumatic listhesis of the lumbar spine. Electronically Signed   By: Tish Frederickson M.D.   On: 05/22/2020 23:18    Procedures Procedures (including critical care time)  Medications Ordered in ED Medications - No data to display  ED Course  I have reviewed the triage vital signs and the nursing notes.  Pertinent labs & imaging results that were available during my care of the patient were reviewed by  me and considered in my medical decision making (see chart for details).    MDM Rules/Calculators/A&P  86 year old female presenting to the ED after unwitnessed fall.  States she does not recall actually falling or mechanism of the fall but states she was told she was on the floor face down.  She is unsure of loss of consciousness.  She has known history of dementia and is awake, alert, oriented to self and situation.  She mostly complains of pain in her right foot.  She does have area of contusion along forehead and along bridge of nose.  She has dried blood in the nasal passages but no active bleeding.  Suspect nasal fracture.  She is not currently on any type of anticoagulation but given her age, will obtain CT of the head/face/neck and films of right foot.  Imaging today with findings of bilateral nasal bone fractures and fractures of right 3rd and 4th toes.  Results discussed with patient and cousin who has come to bedside.  She will be placed in post-op shoe and given orthopedic and ENT follow-up.  She may return here for any new/acute changes.  Final Clinical Impression(s) / ED Diagnoses Final diagnoses:  Closed fracture of nasal bone, initial encounter  Closed nondisplaced fracture of proximal phalanx of lesser toe of right foot, initial encounter    Rx / DC Orders ED Discharge Orders    None       Garlon HatchetSanders, Arianna Delsanto M, PA-C 05/22/20 [id number]    Geoffery Lyonselo, Douglas, MD 05/24/20 [phone]

## 2020-05-22 NOTE — ED Triage Notes (Signed)
Pt arrives EMS from [facility] after an unwitnessed fall. Pt was found face down has visible blood on nose. Pt not sure if face hurts but sts toes are hurting.

## 2020-05-23 ENCOUNTER — Encounter: Payer: Self-pay | Admitting: Nurse Practitioner

## 2020-05-23 ENCOUNTER — Non-Acute Institutional Stay (SKILLED_NURSING_FACILITY): Payer: Medicare Other | Admitting: Nurse Practitioner

## 2020-05-23 DIAGNOSIS — F01518 Vascular dementia, unspecified severity, with other behavioral disturbance: Secondary | ICD-10-CM

## 2020-05-23 DIAGNOSIS — S022XXA Fracture of nasal bones, initial encounter for closed fracture: Secondary | ICD-10-CM | POA: Insufficient documentation

## 2020-05-23 DIAGNOSIS — S92534D Nondisplaced fracture of distal phalanx of right lesser toe(s), subsequent encounter for fracture with routine healing: Secondary | ICD-10-CM | POA: Diagnosis not present

## 2020-05-23 DIAGNOSIS — F418 Other specified anxiety disorders: Secondary | ICD-10-CM

## 2020-05-23 DIAGNOSIS — F0151 Vascular dementia with behavioral disturbance: Secondary | ICD-10-CM

## 2020-05-23 DIAGNOSIS — E871 Hypo-osmolality and hyponatremia: Secondary | ICD-10-CM

## 2020-05-23 DIAGNOSIS — S022XXD Fracture of nasal bones, subsequent encounter for fracture with routine healing: Secondary | ICD-10-CM | POA: Diagnosis not present

## 2020-05-23 DIAGNOSIS — R188 Other ascites: Secondary | ICD-10-CM | POA: Diagnosis not present

## 2020-05-23 DIAGNOSIS — R7989 Other specified abnormal findings of blood chemistry: Secondary | ICD-10-CM

## 2020-05-23 NOTE — Assessment & Plan Note (Signed)
Hospitalized 05/06/20-05/14/20 for fever. She was found to have elevated liver enzymes in ED which was normalized upon dc, CT abd showed 10.7u cm fluid collectin around the dome of the liver, repeated CT abd showed resolution following  underwent abscess aspiration 05/07/20 and removed drain 05/13/20, treated with 7 day course of Zosyn for liver abscess, ID no further tx or f/u needed.

## 2020-05-23 NOTE — Assessment & Plan Note (Signed)
X-ray R foot showed closed nondisplaced fracture of proximal phalanx of lesser toes 3rd, 4th  of right foot, placed in boot, f/u Dr. Shon Baton.

## 2020-05-23 NOTE — ED Notes (Signed)
Report given to Patience, RN of [facility].   PTAR notified of pt transfer.

## 2020-05-23 NOTE — Assessment & Plan Note (Signed)
fall [id number] when the patient was found face down on the bedroom floor near her bed. CT ED head/cervical spine/masilofacial showed closed fracture of nasal bone, f/u Dr. Marene Lenz DO otolaryngology.

## 2020-05-23 NOTE — Assessment & Plan Note (Signed)
Dementia, resides in memory care unit [facility], takes Memantine, Donepezil ° °

## 2020-05-23 NOTE — Assessment & Plan Note (Signed)
Hyponatremia, Na 139, Bun 14, creat 1.08 05/14/20

## 2020-05-23 NOTE — Progress Notes (Signed)
[facility]   [facility] Room Number: 103 Place of Service:  SNF (31) Provider: Aimy Sweeting X, NP   Mahlon GammonGupta, Anjali L, MD  Patient Care Team: Mahlon GammonGupta, Anjali L, MD as PCP - General (Internal Medicine)  Extended Emergency Contact Information Primary Emergency Contact: Ramiro HarvestJeffreys, [facility] Address: [address]          [place], [place] [id number] [place] Phone: [phone] Relation: Relative Secondary Emergency Contact: Marlowe KaysWest, Doug Mobile Phone: [phone] Relation: Relative  Code Status:  DNR Goals of care: Advanced Directive information Advanced Directives 05/23/2020  Does Patient Have a Medical Advance Directive? Yes  Type of Estate agentAdvance Directive Healthcare Power of G[place]orney;Living will  Does patient want to make changes to medical advance directive? No - Patient declined  Copy of Healthcare Power of Attorney in Chart? Yes - validated most recent copy scanned in chart (See row information)  Pre-existing out of facility DNR order (yellow form or pink MOST form) -     Chief Complaint  Patient presents with  . Follow-up    Follow up visit ED evaluation    HPI:  Pt is a 86 y.o. female seen today for an acute visit for fall [id number] when the patient was found face down on the bedroom floor near her bed. CT ED head/cervical spine/masilofacial showed closed fracture of nasal bone, f/u Dr. Marene LenzSkotnicki DO otolaryngology. X-ray R foot showed closed nondisplaced fracture of proximal phalanx of lesser toes 3rd, 4th  of right foot, placed in boot, f/u Dr. Shon BatonBrooks.    Hospitalized 05/06/20-05/14/20 for fever. She was found to have elevated liver enzymes in ED which was normalized upon dc, CT abd showed 10.7u cm fluid collectin around the dome of the liver, repeated CT abd showed resolution following  underwent abscess aspiration 05/07/20 and removed drain 05/13/20, treated with 7 day course of Zosyn for liver abscess, ID no further tx or f/u needed.               Dementia, [facility], takes Memantine, Donepezil Anxiety/depression, takes prn  Seroquel Elevated TSH 03/28/20 TSH 5.78, repeat TSH in 6 weeks  Hyponatremia, Na 139, Bun 14, creat 1.08 05/14/20  Past Medical History:  Diagnosis Date  . Arthritis   . Dementia (HCC)   . Hx of concussion    [facility]  . Hyperlipidemia   . Vitamin D deficiency, unspecified    Past Surgical History:  Procedure Laterality Date  . CHOLECYSTECTOMY      Allergies  Allergen Reactions  . Fish-Derived Products   . Pravachol [Pravastatin Sodium]     Allergies as of 05/23/2020      Reactions   Fish-derived Products    Pravachol [pravastatin Sodium]       Medication List       Accurate as of May 23, 2020 11:59 PM. If you have any questions, ask your nurse or doctor.        STOP taking these medications   lisinopril-hydrochlorothiazide 10-12.5 MG tablet Commonly known as: ZESTORETIC Stopped by: Zury Fazzino X Sallie Maker, NP     TAKE these medications   CALCIUM-VITAMIN D PO Take 1 tablet by mouth in the morning and at bedtime. 600mg  (1,500mg ) - 400 unit   clonazePAM 0.5 MG tablet Commonly known as: KLONOPIN Take 1 tablet (0.5 mg total) by mouth at bedtime for 5 doses.   donepezil 10 MG tablet Commonly known as: ARICEPT Take 10 mg by mouth  at bedtime.   memantine 10 MG tablet Commonly known as: NAMENDA Take 10 mg by mouth 2 (two) times daily.   POLYVINYL ALCOHOL-POVIDONE OP Place 2 drops into both eyes 3 (three) times daily. drops; 0.5-0.6 %; amt: 2 drops each eye; ophthalmic (eye)   QUEtiapine 25 MG tablet Commonly known as: SEROQUEL Take 12.5 mg by mouth daily.   triamcinolone 0.1 % Commonly known as: KENALOG Apply 1 application topically 2 (two) times daily as needed (rash on ankles). Apply to rash on ankles twice a day until healed.       Review of Systems  Constitutional: Positive for activity  change, appetite change and fatigue. Negative for fever.  HENT: Positive for hearing loss. Negative for congestion and voice change.   Eyes: Negative for visual disturbance.       Dry eyes  Respiratory: Negative for cough and shortness of breath.   Cardiovascular: Negative for leg swelling.  Gastrointestinal: Negative for abdominal pain and constipation.  Genitourinary: Negative for dysuria and urgency.  Musculoskeletal: Positive for arthralgias and gait problem.       Nose bone fx, R 3rd, 4th toe fxs.   Skin: Positive for wound. Negative for color change.       Facial contusion, right 3rd, 4th  toe fx, left forearm bruise.   Neurological: Negative for facial asymmetry, speech difficulty, weakness and headaches.       Dementia  Psychiatric/Behavioral: Positive for confusion. Negative for behavioral problems and sleep disturbance. The patient is not nervous/anxious.        Fatigue.     Immunization History  Administered Date(s) Administered  . Influenza-Unspecified 02/26/2019, 03/07/2020  . Moderna Sars-Covid-2 Vaccination 05/28/2019, 06/25/2019, 04/03/2020  . Pneumococcal-Unspecified 05/22/2016   Pertinent  Health Maintenance Due  Topic Date Due  . DEXA SCAN  Never done  . PNA vac Low Risk Adult (2 of 2 - PCV13) 05/22/2017  . INFLUENZA VACCINE  Completed   No flowsheet data found. Functional Status Survey:    Vitals:   05/23/20 1025  BP: 124/70  Pulse: 77  Resp: 18  Temp: 98.9 F (37.2 C)  SpO2: 96%  Weight: 155 lb 12.8 oz (70.7 kg)  Height: 5\' 6"  (1.676 m)   Body mass index is 25.15 kg/m. Physical Exam Vitals and nursing note reviewed.  Constitutional:      Comments: Appears sleepy  HENT:     Head: Normocephalic and atraumatic.     Mouth/Throat:     Mouth: Mucous membranes are dry.  Eyes:     Extraocular Movements: Extraocular movements intact.     Conjunctiva/sclera: Conjunctivae normal.     Pupils: Pupils are equal, round, and reactive to light.   Cardiovascular:     Rate and Rhythm: Normal rate and regular rhythm.     Heart sounds: No murmur heard.   Pulmonary:     Effort: Pulmonary effort is normal.     Breath sounds: No rales.  Abdominal:     Palpations: Abdomen is soft.     Tenderness: There is no abdominal tenderness.  Musculoskeletal:     Cervical back: Normal range of motion and neck supple.     Right lower leg: No edema.     Left lower leg: No edema.  Skin:    General: Skin is warm and dry.     Findings: Bruising present.     Comments: Nose bridge/facial contusion, right 3rd, 4th  toe fx, R knee, left forearm bruise  Neurological:  General: No focal deficit present.     Mental Status: She is alert.     Gait: Gait abnormal.     Comments: Oriented to person, requested to use bathroom during my examination.   Psychiatric:     Comments: Confused, pleasant, conversed     Labs reviewed: Recent Labs    05/11/20 0532 05/12/20 0525 05/13/20 0516 05/14/20 0440  NA 139 141 139 139  K 3.7 3.5 3.4* 4.0  CL 103 103 102 104  CO2 26 27 26 25   GLUCOSE 88 87 113* 80  BUN 13 13 13 14   CREATININE 0.96 1.09* 1.11* 1.08*  CALCIUM 8.8* 8.7* 9.0 9.0  MG 2.2 2.2 2.1  --   PHOS 3.9 3.5 3.4  --    Recent Labs    05/08/20 0446 05/10/20 0511 05/13/20 0516  AST 30 21 18   ALT 64* 38 27  ALKPHOS 152* 141* 112  BILITOT 0.6 0.6 0.5  PROT 5.7* 5.7* 6.5  ALBUMIN 2.4* 2.3* 2.7*   Recent Labs    03/28/20 0000 03/28/20 0000 05/02/20 2225 05/06/20 1937 05/08/20 0704 05/10/20 0511 05/12/20 0525 05/13/20 0516  WBC 5.4   < > 7.3 6.9   < > 6.4 7.5 7.3  NEUTROABS 3,202.00  --  6.5 5.3  --   --   --   --   HGB 13.2  --  11.4* 11.3*   < > 10.5* 10.9* 11.9*  HCT 40  --  36.1 35.3*   < > 32.7* 33.9* 36.4  MCV  --    < > 93.0 93.1   < > 92.1 92.9 92.9  PLT 199  --  132* 117*   < > 228 242 266   < > = values in this interval not displayed.   Lab Results  Component Value Date   TSH 5.78 03/28/2020   No results found  for: HGBA1C No results found for: CHOL, HDL, LDLCALC, LDLDIRECT, TRIG, CHOLHDL  Significant Diagnostic Results in last 30 days:  CT Head Wo Contrast  Result Date: 05/22/2020 CLINICAL DATA:  Facial trauma status post unwitnessed fall. EXAM: CT HEAD WITHOUT CONTRAST CT MAXILLOFACIAL WITHOUT CONTRAST CT CERVICAL SPINE WITHOUT CONTRAST TECHNIQUE: Multidetector CT imaging of the head, cervical spine, and maxillofacial structures were performed using the standard protocol without intravenous contrast. Multiplanar CT image reconstructions of the cervical spine and maxillofacial structures were also generated. COMPARISON:  CT head 05/02/2020. FINDINGS: CT HEAD FINDINGS Brain: Cerebral ventricle sizes are concordant with the degree of cerebral volume loss. Patchy and confluent areas of decreased attenuation are noted throughout the deep and periventricular white matter of the cerebral hemispheres bilaterally, compatible with chronic microvascular ischemic disease. No evidence of large-territorial acute infarction. Hyperdense foci within the right frontal lobe is noted to be artifactual (6:16, 6:29-30). No parenchymal hemorrhage. No mass lesion. No extra-axial collection. No mass effect or midline shift. No hydrocephalus. Basilar cisterns are patent. Vascular: No hyperdense vessel. Skull: No acute fracture or focal lesion. Other: None. CT MAXILLOFACIAL FINDINGS Osseous: Acute comminuted and minimally displaced bilateral nasal bone fractures. No other acute displaced fracture the face. Sinuses/Orbits: Paranasal sinuses and mastoid air cells are clear. The orbits are unremarkable. Soft tissues: Associated mild midline frontal and nasal subcutaneus soft tissue edema. CT CERVICAL SPINE FINDINGS Alignment: Normal. Skull base and vertebrae: Multilevel degenerative change of the spine worse at the C4 through C6 levels with intervertebral disc space narrowing, osteophyte formation, mild facet arthropathy, mild uncovertebral  arthropathy. No acute fracture.  No aggressive appearing focal osseous lesion or focal pathologic process. Soft tissues and spinal canal: No prevertebral fluid or swelling. No visible canal hematoma. Disc levels:  Maintained. Upper chest: Biapical pleural/pulmonary scarring. Other: Suggestion of a right posterior tracheal diverticula. IMPRESSION: 1. No acute intracranial abnormality. 2. Acute comminuted and minimally displaced bilateral nasal bone fracture. 3. No acute displaced fracture or traumatic listhesis of the lumbar spine. Electronically Signed   By: Tish Frederickson M.D.   On: 05/22/2020 23:18   CT Head Wo Contrast  Result Date: 05/02/2020 CLINICAL DATA:  Status post fall. EXAM: CT HEAD WITHOUT CONTRAST CT CERVICAL SPINE WITHOUT CONTRAST TECHNIQUE: Multidetector CT imaging of the head and cervical spine was performed following the standard protocol without intravenous contrast. Multiplanar CT image reconstructions of the cervical spine were also generated. COMPARISON:  None. FINDINGS: CT HEAD FINDINGS Brain: There is moderate severity cerebral atrophy with widening of the extra-axial spaces and ventricular dilatation. There are areas of decreased attenuation within the white matter tracts of the supratentorial brain, consistent with microvascular disease changes. Vascular: No hyperdense vessel or unexpected calcification. Skull: Normal. Negative for fracture or focal lesion. Sinuses/Orbits: No acute finding. Other: None. CT CERVICAL SPINE FINDINGS Alignment: Normal. Skull base and vertebrae: No acute fracture. No primary bone lesion or focal pathologic process. Soft tissues and spinal canal: No prevertebral fluid or swelling. No visible canal hematoma. Disc levels: Marked severity endplate sclerosis is seen at the levels of C4-C5 and C5-C6, with mild anterior osteophyte formation noted at the levels of C2-C3 C6-C7 and C7-T1. Marked severity intervertebral disc space narrowing is seen at the levels of  C4-C5 and C5-C6. Marked severity bilateral multilevel facet joint hypertrophy is noted. Upper chest: Mild biapical scarring and/or atelectasis is seen. Other: N/A IMPRESSION: 1. Moderate severity cerebral atrophy and microvascular disease changes of the supratentorial brain. 2. Marked severity degenerative changes of the cervical spine without evidence of an acute fracture or subluxation. Electronically Signed   By: Aram Candela M.D.   On: 05/02/2020 22:22   CT Cervical Spine Wo Contrast  Result Date: 05/22/2020 CLINICAL DATA:  Facial trauma status post unwitnessed fall. EXAM: CT HEAD WITHOUT CONTRAST CT MAXILLOFACIAL WITHOUT CONTRAST CT CERVICAL SPINE WITHOUT CONTRAST TECHNIQUE: Multidetector CT imaging of the head, cervical spine, and maxillofacial structures were performed using the standard protocol without intravenous contrast. Multiplanar CT image reconstructions of the cervical spine and maxillofacial structures were also generated. COMPARISON:  CT head 05/02/2020. FINDINGS: CT HEAD FINDINGS Brain: Cerebral ventricle sizes are concordant with the degree of cerebral volume loss. Patchy and confluent areas of decreased attenuation are noted throughout the deep and periventricular white matter of the cerebral hemispheres bilaterally, compatible with chronic microvascular ischemic disease. No evidence of large-territorial acute infarction. Hyperdense foci within the right frontal lobe is noted to be artifactual (6:16, 6:29-30). No parenchymal hemorrhage. No mass lesion. No extra-axial collection. No mass effect or midline shift. No hydrocephalus. Basilar cisterns are patent. Vascular: No hyperdense vessel. Skull: No acute fracture or focal lesion. Other: None. CT MAXILLOFACIAL FINDINGS Osseous: Acute comminuted and minimally displaced bilateral nasal bone fractures. No other acute displaced fracture the face. Sinuses/Orbits: Paranasal sinuses and mastoid air cells are clear. The orbits are unremarkable.  Soft tissues: Associated mild midline frontal and nasal subcutaneus soft tissue edema. CT CERVICAL SPINE FINDINGS Alignment: Normal. Skull base and vertebrae: Multilevel degenerative change of the spine worse at the C4 through C6 levels with intervertebral disc space narrowing, osteophyte formation, mild facet arthropathy, mild  uncovertebral arthropathy. No acute fracture. No aggressive appearing focal osseous lesion or focal pathologic process. Soft tissues and spinal canal: No prevertebral fluid or swelling. No visible canal hematoma. Disc levels:  Maintained. Upper chest: Biapical pleural/pulmonary scarring. Other: Suggestion of a right posterior tracheal diverticula. IMPRESSION: 1. No acute intracranial abnormality. 2. Acute comminuted and minimally displaced bilateral nasal bone fracture. 3. No acute displaced fracture or traumatic listhesis of the lumbar spine. Electronically Signed   By: Tish Frederickson M.D.   On: 05/22/2020 23:18   CT Cervical Spine Wo Contrast  Result Date: 05/02/2020 CLINICAL DATA:  Status post fall. EXAM: CT HEAD WITHOUT CONTRAST CT CERVICAL SPINE WITHOUT CONTRAST TECHNIQUE: Multidetector CT imaging of the head and cervical spine was performed following the standard protocol without intravenous contrast. Multiplanar CT image reconstructions of the cervical spine were also generated. COMPARISON:  None. FINDINGS: CT HEAD FINDINGS Brain: There is moderate severity cerebral atrophy with widening of the extra-axial spaces and ventricular dilatation. There are areas of decreased attenuation within the white matter tracts of the supratentorial brain, consistent with microvascular disease changes. Vascular: No hyperdense vessel or unexpected calcification. Skull: Normal. Negative for fracture or focal lesion. Sinuses/Orbits: No acute finding. Other: None. CT CERVICAL SPINE FINDINGS Alignment: Normal. Skull base and vertebrae: No acute fracture. No primary bone lesion or focal pathologic  process. Soft tissues and spinal canal: No prevertebral fluid or swelling. No visible canal hematoma. Disc levels: Marked severity endplate sclerosis is seen at the levels of C4-C5 and C5-C6, with mild anterior osteophyte formation noted at the levels of C2-C3 C6-C7 and C7-T1. Marked severity intervertebral disc space narrowing is seen at the levels of C4-C5 and C5-C6. Marked severity bilateral multilevel facet joint hypertrophy is noted. Upper chest: Mild biapical scarring and/or atelectasis is seen. Other: N/A IMPRESSION: 1. Moderate severity cerebral atrophy and microvascular disease changes of the supratentorial brain. 2. Marked severity degenerative changes of the cervical spine without evidence of an acute fracture or subluxation. Electronically Signed   By: Aram Candela M.D.   On: 05/02/2020 22:24   CT ABDOMEN PELVIS W CONTRAST  Result Date: 05/12/2020 CLINICAL DATA:  Status post percutaneous catheter drainage of abscess superior to the dome of the liver and in a subdiaphragmatic location on 05/07/2020. EXAM: CT ABDOMEN AND PELVIS WITH CONTRAST TECHNIQUE: Multidetector CT imaging of the abdomen and pelvis was performed using the standard protocol following bolus administration of intravenous contrast. CONTRAST:  80mL OMNIPAQUE IOHEXOL 300 MG/ML  SOLN COMPARISON:  CT of the abdomen and pelvis on 05/06/2020 FINDINGS: Lower chest: Stable trace right pleural fluid and small left pleural effusion. Mild bibasilar atelectasis. Hepatobiliary: Suprahepatic percutaneous drain remains in place with resolution of the suprahepatic/subdiaphragmatic abscess. Trace fluid remains adjacent to the drainage catheter. The liver is unremarkable. The gallbladder has been removed. No biliary ductal dilatation identified. Pancreas: Unremarkable. No pancreatic ductal dilatation or surrounding inflammatory changes. Spleen: Normal in size without focal abnormality. Adrenals/Urinary Tract: Adrenal glands are unremarkable.  Kidneys are normal, without renal calculi, focal lesion, or hydronephrosis. Bladder is unremarkable. Stomach/Bowel: Bowel shows no evidence of obstruction, inflammation or perforation. Vascular/Lymphatic: Stable atherosclerosis of the abdominal aorta and iliac arteries without evidence of aneurysm. No enlarged lymph nodes identified. Reproductive: Uterus and bilateral adnexa are unremarkable. Other: Stable bilateral inguinal hernias containing fat, right greater than left. No ascites. No new fluid collections. Musculoskeletal: No acute or significant osseous findings. IMPRESSION: 1. Resolution of suprahepatic/subdiaphragmatic abscess after percutaneous drain placement. 2. Stable trace right pleural fluid  and small left pleural effusion. 3. Stable bilateral inguinal hernias containing fat, right greater than left. 4. Aortic atherosclerosis. Aortic Atherosclerosis (ICD10-I70.0). Electronically Signed   By: Irish Lack M.D.   On: 05/12/2020 11:01   CT ABDOMEN PELVIS W CONTRAST  Result Date: 05/06/2020 CLINICAL DATA:  Abdominal pain EXAM: CT ABDOMEN AND PELVIS WITH CONTRAST TECHNIQUE: Multidetector CT imaging of the abdomen and pelvis was performed using the standard protocol following bolus administration of intravenous contrast. CONTRAST:  OMNIPAQUE IOHEXOL 300 MG/ML  SOLN COMPARISON:  None. FINDINGS: Lower chest: Small bilateral pleural effusions. Bibasilar atelectasis. Hepatobiliary: No focal hepatic abnormality. There is a fluid collection noted along the dome of the liver. This appears to be subdiaphragmatic on the sagittal images. This may reflect a subcapsular fluid collection. This measures 10.7 x 7.6 x 4.8 cm. This also extends along the lateral surface of the upper liver. Prior cholecystectomy. Pancreas: No focal abnormality or ductal dilatation. Spleen: No focal abnormality.  Normal size. Adrenals/Urinary Tract: No adrenal abnormality. No focal renal abnormality. No stones or hydronephrosis.  Urinary bladder is unremarkable. Stomach/Bowel: Postoperative changes in the sigmoid colon. Colonic diverticulosis. No active diverticulitis. Stomach and small bowel decompressed. Vascular/Lymphatic: Aortoiliac atherosclerosis. No evidence of aneurysm or adenopathy. Reproductive: Uterus and adnexa unremarkable.  No mass. Other: No free fluid or free air. Small bilateral inguinal hernias containing fat. Musculoskeletal: No acute bony abnormality. IMPRESSION: Large fluid collection along the dome of the liver, likely subcapsular fluid collection of unknown etiology. This measures up to 10.7 cm. Colonic diverticulosis.  No active diverticulitis. Small bilateral pleural effusions.  Bibasilar atelectasis. Aortoiliac atherosclerosis. Bilateral inguinal hernias containing fat. Electronically Signed   By: Charlett Nose M.D.   On: 05/06/2020 22:37   DG Chest Port 1 View  Result Date: 05/06/2020 CLINICAL DATA:  Questionable sepsis EXAM: PORTABLE CHEST 1 VIEW COMPARISON:  None. FINDINGS: Heart is normal size. Bibasilar atelectasis. No effusions. No acute bony abnormality. IMPRESSION: Bibasilar atelectasis. Electronically Signed   By: Charlett Nose M.D.   On: 05/06/2020 19:41   DG Foot Complete Right  Result Date: 05/22/2020 CLINICAL DATA:  Unwitnessed fall at nursing home.  Foot pain. EXAM: RIGHT FOOT COMPLETE - 3+ VIEW COMPARISON:  None. FINDINGS: Suspected nondisplaced fracture of the fourth toe proximal phalanx. Possible fracture of the third toe proximal phalanx. No intra-articular involvement of either fracture. Slight hammertoe deformity of the digits. Osteoarthritis of the first metatarsal phalangeal joint. No erosion or bony destruction. Incidental os navicular. No focal soft tissue abnormalities are seen. IMPRESSION: 1. Suspected nondisplaced fracture of the fourth toe proximal phalanx and possible fracture of the third toe proximal phalanx. 2. Osteoarthritis of the first metatarsophalangeal joint.  Electronically Signed   By: Narda Rutherford M.D.   On: 05/22/2020 22:45   [place] IMAGE GUIDED FLUID DRAIN BY CATHETER  Result Date: 05/07/2020 INDICATION: Fluid collection superior to the dome of the liver and subdiaphragmatic in location. EXAM: ULTRASOUND-GUIDED PERCUTANEOUS CATHETER DRAINAGE PERIHEPATIC ABSCESS MEDICATIONS: No additional medications other than sedative medications documented below. ANESTHESIA/SEDATION: Fentanyl 25 mcg IV; Versed 0.5 mg IV Moderate Sedation Time:  12 minutes. The patient was continuously monitored during the procedure by the interventional radiology nurse under my direct supervision. COMPLICATIONS: None immediate. PROCEDURE: Informed written consent was obtained from the patient after a thorough discussion of the procedural risks, benefits and alternatives. All questions were addressed. Maximal Sterile Barrier Technique was utilized including caps, mask, sterile gowns, sterile gloves, sterile drape, hand hygiene and skin antiseptic. A timeout  was performed prior to the initiation of the procedure. Ultrasound was used to localize a fluid collection superior to the dome of the liver. Under direct ultrasound guidance, and following infiltration of 1% lidocaine for local anesthesia, a 5 [place] Yueh centesis catheter was inserted over a 19 gauge needle into the fluid collection from a lateral approach. Aspiration of fluid was performed. A 20 mL sample was collected and sent for culture analysis. The Yueh catheter was removed over a guidewire. The tract was dilated to 10 [place]. A 10 French percutaneous drainage catheter was then advanced into the collection. Catheter position was confirmed by ultrasound. The catheter was flushed with saline and connected to a suction bulb. It was secured at the skin with a Prolene retention suture and StatLock device. FINDINGS: Aspiration at the level of the lentiform shaped suprahepatic fluid collection yielded grossly purulent fluid. Due to nature  of fluid, a percutaneous drainage catheter was placed. There is good return of fluid from the drain after placement and attachment to suction bulb drainage. IMPRESSION: Ultrasound-guided aspiration of the right-sided suprahepatic/infradiaphragmatic fluid collection yielded grossly purulent fluid. A sample was sent for culture analysis. A 10 French percutaneous drainage catheter was placed and attached to suction bulb drainage. Electronically Signed   By: Irish Lack M.D.   On: 05/07/2020 15:51   CT Maxillofacial Wo Contrast  Result Date: 05/22/2020 CLINICAL DATA:  Facial trauma status post unwitnessed fall. EXAM: CT HEAD WITHOUT CONTRAST CT MAXILLOFACIAL WITHOUT CONTRAST CT CERVICAL SPINE WITHOUT CONTRAST TECHNIQUE: Multidetector CT imaging of the head, cervical spine, and maxillofacial structures were performed using the standard protocol without intravenous contrast. Multiplanar CT image reconstructions of the cervical spine and maxillofacial structures were also generated. COMPARISON:  CT head 05/02/2020. FINDINGS: CT HEAD FINDINGS Brain: Cerebral ventricle sizes are concordant with the degree of cerebral volume loss. Patchy and confluent areas of decreased attenuation are noted throughout the deep and periventricular white matter of the cerebral hemispheres bilaterally, compatible with chronic microvascular ischemic disease. No evidence of large-territorial acute infarction. Hyperdense foci within the right frontal lobe is noted to be artifactual (6:16, 6:29-30). No parenchymal hemorrhage. No mass lesion. No extra-axial collection. No mass effect or midline shift. No hydrocephalus. Basilar cisterns are patent. Vascular: No hyperdense vessel. Skull: No acute fracture or focal lesion. Other: None. CT MAXILLOFACIAL FINDINGS Osseous: Acute comminuted and minimally displaced bilateral nasal bone fractures. No other acute displaced fracture the face. Sinuses/Orbits: Paranasal sinuses and mastoid air cells are  clear. The orbits are unremarkable. Soft tissues: Associated mild midline frontal and nasal subcutaneus soft tissue edema. CT CERVICAL SPINE FINDINGS Alignment: Normal. Skull base and vertebrae: Multilevel degenerative change of the spine worse at the C4 through C6 levels with intervertebral disc space narrowing, osteophyte formation, mild facet arthropathy, mild uncovertebral arthropathy. No acute fracture. No aggressive appearing focal osseous lesion or focal pathologic process. Soft tissues and spinal canal: No prevertebral fluid or swelling. No visible canal hematoma. Disc levels:  Maintained. Upper chest: Biapical pleural/pulmonary scarring. Other: Suggestion of a right posterior tracheal diverticula. IMPRESSION: 1. No acute intracranial abnormality. 2. Acute comminuted and minimally displaced bilateral nasal bone fracture. 3. No acute displaced fracture or traumatic listhesis of the lumbar spine. Electronically Signed   By: Tish Frederickson M.D.   On: 05/22/2020 23:18    Assessment/Plan Nasal bone fracture fall [id number] when the patient was found face down on the bedroom floor near her bed. CT ED head/cervical spine/masilofacial showed closed fracture of nasal bone, f/u Dr.  Skotnicki DO otolaryngology.   Nondisplaced fracture of distal phalanx of right lesser toe(s), subsequent encounter for fracture with routine healing X-ray R foot showed closed nondisplaced fracture of proximal phalanx of lesser toes 3rd, 4th  of right foot, placed in boot, f/u Dr. Shon Baton.    Intraabdominal fluid collection Hospitalized 05/06/20-05/14/20 for fever. She was found to have elevated liver enzymes in ED which was normalized upon dc, CT abd showed 10.7u cm fluid collectin around the dome of the liver, repeated CT abd showed resolution following  underwent abscess aspiration 05/07/20 and removed drain 05/13/20, treated with 7 day course of Zosyn for liver abscess, ID no further tx or f/u needed.    Vascular dementia  (HCC) Dementia, resides in memory care unit [facility], takes Memantine, Donepezil   Depression with anxiety Anxiety/depression, takes prn  Seroquel  Elevated TSH Elevated TSH 03/28/20 TSH 5.78, repeat TSH in 6 weeks  Hyponatremia Hyponatremia, Na 139, Bun 14, creat 1.08 05/14/20      Family/ staff Communication: plan of care reviewed with the patient and charge nurse.   Labs/tests ordered:  none  Time spend 35 minutes.

## 2020-05-23 NOTE — Assessment & Plan Note (Signed)
Elevated TSH 03/28/20 TSH 5.78, repeat TSH in 6 weeks

## 2020-05-23 NOTE — Assessment & Plan Note (Signed)
Anxiety/depression, takes prn  Seroquel 

## 2020-05-24 ENCOUNTER — Other Ambulatory Visit: Payer: Self-pay

## 2020-05-24 ENCOUNTER — Encounter: Payer: Self-pay | Admitting: Nurse Practitioner

## 2020-05-24 MED ORDER — CLONAZEPAM 0.5 MG PO TABS: 0.5000 mg | ORAL_TABLET | Freq: Every day | ORAL | 0 refills | Status: DC

## 2020-05-24 NOTE — Telephone Encounter (Signed)
Could you please send to pharmacy again it printed here in the office. I will repend medication. Medication repended and sent back to Dr. Chales Larsen

## 2020-05-24 NOTE — Telephone Encounter (Signed)
Refill request received from [facility] for clonazepam 0.5 mg tablet take 1/2 tablet every morning for 2 weeks. Medication pended and sent to Dr. Chales Abrahams for approval

## 2020-05-28 MED ORDER — CLONAZEPAM 0.5 MG PO TABS: 0.5000 mg | ORAL_TABLET | Freq: Every day | ORAL | 0 refills | Status: DC

## 2020-05-29 LAB — COMPREHENSIVE METABOLIC PANEL
Albumin: 3.7 (ref 3.5–5.0)
Calcium: 10.1 (ref 8.7–10.7)
GFR calc Af Amer: 36
GFR calc non Af Amer: 31
Globulin: 3.8

## 2020-05-29 LAB — CBC AND DIFFERENTIAL
HCT: 37 (ref 36–46)
Hemoglobin: 12.5 (ref 12.0–16.0)
Platelets: 206 (ref 150–399)
WBC: 5.5

## 2020-05-29 LAB — BASIC METABOLIC PANEL
BUN: 19 (ref 4–21)
CO2: 30 — AB (ref 13–22)
Chloride: 103 (ref 99–108)
Creatinine: 1.5 — AB (ref 0.5–1.1)
Glucose: 123
Potassium: 3.2 — AB (ref 3.4–5.3)
Sodium: 142 (ref 137–147)

## 2020-05-29 LAB — HEPATIC FUNCTION PANEL
ALT: 12 (ref 7–35)
AST: 17 (ref 13–35)
Alkaline Phosphatase: 76 (ref 25–125)

## 2020-05-29 LAB — CBC: RBC: 4.11 (ref 3.87–5.11)

## 2020-05-30 ENCOUNTER — Encounter: Payer: Self-pay | Admitting: Nurse Practitioner

## 2020-05-30 ENCOUNTER — Non-Acute Institutional Stay (SKILLED_NURSING_FACILITY): Payer: Medicare Other | Admitting: Nurse Practitioner

## 2020-05-30 DIAGNOSIS — R7989 Other specified abnormal findings of blood chemistry: Secondary | ICD-10-CM

## 2020-05-30 DIAGNOSIS — N183 Chronic kidney disease, stage 3 unspecified: Secondary | ICD-10-CM | POA: Insufficient documentation

## 2020-05-30 DIAGNOSIS — E876 Hypokalemia: Secondary | ICD-10-CM | POA: Diagnosis not present

## 2020-05-30 DIAGNOSIS — R188 Other ascites: Secondary | ICD-10-CM

## 2020-05-30 DIAGNOSIS — F01518 Vascular dementia, unspecified severity, with other behavioral disturbance: Secondary | ICD-10-CM

## 2020-05-30 DIAGNOSIS — F0151 Vascular dementia with behavioral disturbance: Secondary | ICD-10-CM

## 2020-05-30 DIAGNOSIS — N1832 Chronic kidney disease, stage 3b: Secondary | ICD-10-CM | POA: Diagnosis not present

## 2020-05-30 DIAGNOSIS — F418 Other specified anxiety disorders: Secondary | ICD-10-CM | POA: Diagnosis not present

## 2020-05-30 DIAGNOSIS — S92534D Nondisplaced fracture of distal phalanx of right lesser toe(s), subsequent encounter for fracture with routine healing: Secondary | ICD-10-CM

## 2020-05-30 DIAGNOSIS — S022XXD Fracture of nasal bones, subsequent encounter for fracture with routine healing: Secondary | ICD-10-CM

## 2020-05-30 NOTE — Assessment & Plan Note (Signed)
Dementia, resides in memory care unit [facility], takes Memantine, will decrease Donepezil to 5mg  qd due to poor appetite.

## 2020-05-30 NOTE — Assessment & Plan Note (Signed)
05/29/20 Na 142, K 3.2, Bun 19, creat 1.53, eGFR 31, wbc 5.5, Hgb 12.5, plt 206, neutrophils 62.1%, encourage oral fluid intake, decrease Donepezil, repeat CMP/eGFR in one week.

## 2020-05-30 NOTE — Assessment & Plan Note (Signed)
Hospitalized 05/06/20-05/14/20 CT abd showed 10.7u cm fluid collectin around the dome of the liver, repeated CT abd showed resolution following underwent abscess aspiration 05/07/20 and removed drain 05/13/20, treated with 7 day course of Zosyn for liver abscess, ID no further tx or f/u needed.

## 2020-05-30 NOTE — Assessment & Plan Note (Signed)
05/29/20 Na 142, K 3.2, Bun 19, creat 1.53, eGFR 31, wbc 5.5, Hgb 12.5, plt 206, neutrophils 62.1% Encourage oral fluid intake, repeat CMP/eGFR one week

## 2020-05-30 NOTE — Assessment & Plan Note (Signed)
Anxiety/depression, takes prn  Seroquel

## 2020-05-30 NOTE — Assessment & Plan Note (Signed)
X-ray R foot showed closed nondisplaced fracture of proximal phalanx of lesser toes 3rd, 4th  of right foot, placed in boot, f/u Dr. Brooks.   

## 2020-05-30 NOTE — Assessment & Plan Note (Signed)
On set 05/22/20 closed fracture of nasal bone, f/u Dr. Skotnicki DO otolaryngology.   

## 2020-05-30 NOTE — Progress Notes (Signed)
Location:   SNF [place] Room Number: 563 SLHTD of Service:  SNF (31) Provider: [facility] Echo Propp NP  [name], MD  Patient Care Team: [name], MD as PCP - General (Internal Medicine)  Extended Emergency Contact Information Primary Emergency Contact: Bertrum Sol, [facility]) [facility] Address: [address]          [place], [place] [address] [name] of [place] Phone: [phone] Relation: Relative Secondary Emergency Contact: [name] Mobile Phone: [phone] Relation: Relative  Code Status: DNR Goals of care: Advanced Directive information Advanced Directives 05/30/2020  Does Patient Have a Medical Advance Directive? Yes  Type of Paramedic of [place];Living will  Does patient want to make changes to medical advance directive? No - Patient declined  Copy of [place] in Chart? Yes - validated most recent copy scanned in chart (See row information)  Pre-existing out of facility DNR order (yellow form or pink MOST form) -     Chief Complaint  Patient presents with  . Acute Visit    Hypokalemia    HPI:  Pt is a 87 y.o. female seen today for an acute visit for low potassium, increased Bun/creat, limited oral intake.   On set 05/22/20 closed fracture of nasal bone, f/u Dr. Fredric Dine DO otolaryngology. X-ray R foot showed closed nondisplaced fracture of proximal phalanx of lesser toes 3rd, 4th  of right foot, placed in boot, f/u Dr. Rolena Infante.               Hospitalized 05/06/20-05/14/20 CT abd showed 10.7u cm fluid collectin around the dome of the liver, repeated CT abd showed resolution following underwent abscess aspiration 05/07/20 and removed drain 05/13/20, treated with 7 day course of Zosyn for liver abscess, ID no further tx or f/u needed.  Dementia, resides in memory care unit [facility], takes Memantine, Donepezil Anxiety/depression, takes prn  Seroquel Elevated TSH11/3/21 TSH  5.78    Past Medical History:  Diagnosis Date  . Arthritis   . Dementia ([place])   . Hx of concussion    [place]  . Hyperlipidemia   . Vitamin D deficiency, unspecified    Past Surgical History:  Procedure Laterality Date  . CHOLECYSTECTOMY      Allergies  Allergen Reactions  . Fish-Derived Products   . Pravachol [Pravastatin Sodium]     Allergies as of 05/30/2020      Reactions   Fish-derived Products    Pravachol [pravastatin Sodium]       Medication List       Accurate as of May 30, 2020 11:59 PM. If you have any questions, ask your nurse or doctor.        STOP taking these medications   DULoxetine 20 MG capsule Commonly known as: CYMBALTA Stopped by: Krysten Veronica X Felis Quillin, NP     TAKE these medications   CALCIUM-VITAMIN D PO Take 1 tablet by mouth in the morning and at bedtime. 696m (1,5057m - 400 unit   clonazePAM 0.5 MG tablet Commonly known as: KLONOPIN Take 1 tablet (0.5 mg total) by mouth at bedtime for 5 doses.   donepezil 10 MG tablet Commonly known as: ARICEPT Take 10 mg by mouth at bedtime.   memantine 10 MG tablet Commonly known as: NAMENDA Take 10 mg by mouth 2 (two) times daily.   POLYVINYL ALCOHOL-POVIDONE OP Place 2 drops into both eyes 3 (three) times daily. drops; 0.5-0.6 %; amt: 2 drops each eye; ophthalmic (eye)  QUEtiapine 25 MG tablet Commonly known as: SEROQUEL Take 12.5 mg by mouth daily.   triamcinolone 0.1 % Commonly known as: KENALOG Apply 1 application topically 2 (two) times daily as needed (rash on ankles). Apply to rash on ankles twice a day until healed.       Review of Systems  Constitutional: Positive for appetite change, fatigue and unexpected weight change. Negative for fever.       Weight loss #6Ibs in the past week.   HENT: Positive for hearing loss. Negative for congestion and voice change.   Eyes: Negative for visual disturbance.       Dry eyes  Respiratory: Negative for  cough and shortness of breath.   Cardiovascular: Negative for leg swelling.  Gastrointestinal: Negative for abdominal pain and constipation.  Genitourinary: Negative for dysuria and urgency.  Musculoskeletal: Positive for arthralgias and gait problem.       Nose bone fx, R 3rd, 4th toe fxs.   Skin: Positive for wound. Negative for color change.       Facial contusion, right 3rd, 4th  toe fx, left forearm bruise.   Neurological: Negative for facial asymmetry, speech difficulty, weakness and headaches.       Dementia  Psychiatric/Behavioral: Positive for confusion. Negative for behavioral problems and sleep disturbance. The patient is not nervous/anxious.        Fatigue.     Immunization History  Administered Date(s) Administered  . Influenza-Unspecified 02/26/2019, 03/07/2020  . Moderna Sars-Covid-2 Vaccination 05/28/2019, 06/25/2019, 04/03/2020  . Pneumococcal-Unspecified 05/22/2016   Pertinent  Health Maintenance Due  Topic Date Due  . DEXA SCAN  Never done  . PNA vac Low Risk Adult (2 of 2 - PCV13) 05/22/2017  . INFLUENZA VACCINE  Completed   No flowsheet data found. Functional Status Survey:    Vitals:   05/30/20 1401  BP: 130/76  Pulse: 90  Resp: 20  Temp: 97.8 F (36.6 C)  SpO2: 96%  Weight: 149 lb 6.4 oz (67.8 kg)  Height: _0  (1.676 m)   Body mass index is 24.11 kg/m. Physical Exam Vitals and nursing note reviewed.  Constitutional:      Appearance: Normal appearance.  HENT:     Head: Normocephalic and atraumatic.     Mouth/Throat:     Mouth: Mucous membranes are dry.  Eyes:     Extraocular Movements: Extraocular movements intact.     Conjunctiva/sclera: Conjunctivae normal.     Pupils: Pupils are equal, round, and reactive to light.  Cardiovascular:     Rate and Rhythm: Normal rate and regular rhythm.     Heart sounds: No murmur heard.   Pulmonary:     Effort: Pulmonary effort is normal.     Breath sounds: No rales.  Abdominal:     Palpations:  Abdomen is soft.     Tenderness: There is no abdominal tenderness.  Musculoskeletal:     Cervical back: Normal range of motion and neck supple.     Right lower leg: No edema.     Left lower leg: No edema.  Skin:    General: Skin is warm and dry.     Findings: Bruising present.     Comments: Nose bridge/facial contusion, right 3rd, 4th  toe fx, R knee, left forearm bruise  Neurological:     General: No focal deficit present.     Mental Status: She is alert.     Gait: Gait abnormal.     Comments: Oriented to person  Psychiatric:  Comments: Confused, pleasant, conversed     Labs reviewed: Recent Labs    05/11/20 0532 05/12/20 0525 05/13/20 0516 05/14/20 0440  NA 139 141 139 139  K 3.7 3.5 3.4* 4.0  CL 103 103 102 104  CO2 _0 GLUCOSE 88 87 113* 80  BUN _1 CREATININE 0.96 1.09* 1.11* 1.08*  CALCIUM 8.8* 8.7* 9.0 9.0  MG 2.2 2.2 2.1  --   PHOS 3.9 3.5 3.4  --    Recent Labs    05/08/20 0446 05/10/20 0511 05/13/20 0516  AST _2 ALT 64* 38 27  ALKPHOS 152* 141* 112  BILITOT 0.6 0.6 0.5  PROT 5.7* 5.7* 6.5  ALBUMIN 2.4* 2.3* 2.7*   Recent Labs    03/28/20 0000 03/28/20 0000 05/02/20 2225 05/06/20 1937 05/08/20 0704 05/10/20 0511 05/12/20 0525 05/13/20 0516  WBC 5.4   < > 7.3 6.9   < > 6.4 7.5 7.3  NEUTROABS 3,202.00  --  6.5 5.3  --   --   --   --   HGB 13.2  --  11.4* 11.3*   < > 10.5* 10.9* 11.9*  HCT 40  --  36.1 35.3*   < > 32.7* 33.9* 36.4  MCV  --    < > 93.0 93.1   < > 92.1 92.9 92.9  PLT 199  --  132* 117*   < > 228 242 266   < > = values in this interval not displayed.   Lab Results  Component Value Date   TSH 5.78 03/28/2020   No results found for: HGBA1C No results found for: CHOL, HDL, LDLCALC, LDLDIRECT, TRIG, CHOLHDL  Significant Diagnostic Results in last 30 days:  CT Head Wo Contrast  Result Date: 05/22/2020 CLINICAL DATA:  Facial trauma status post unwitnessed fall. EXAM: CT HEAD WITHOUT CONTRAST CT  MAXILLOFACIAL WITHOUT CONTRAST CT CERVICAL SPINE WITHOUT CONTRAST TECHNIQUE: Multidetector CT imaging of the head, cervical spine, and maxillofacial structures were performed using the standard protocol without intravenous contrast. Multiplanar CT image reconstructions of the cervical spine and maxillofacial structures were also generated. COMPARISON:  CT head 05/02/2020. FINDINGS: CT HEAD FINDINGS Brain: Cerebral ventricle sizes are concordant with the degree of cerebral volume loss. Patchy and confluent areas of decreased attenuation are noted throughout the deep and periventricular white matter of the cerebral hemispheres bilaterally, compatible with chronic microvascular ischemic disease. No evidence of large-territorial acute infarction. Hyperdense foci within the right frontal lobe is noted to be artifactual (6:16, 6:29-30). No parenchymal hemorrhage. No mass lesion. No extra-axial collection. No mass effect or midline shift. No hydrocephalus. Basilar cisterns are patent. Vascular: No hyperdense vessel. Skull: No acute fracture or focal lesion. Other: None. CT MAXILLOFACIAL FINDINGS Osseous: Acute comminuted and minimally displaced bilateral nasal bone fractures. No other acute displaced fracture the face. Sinuses/Orbits: Paranasal sinuses and mastoid air cells are clear. The orbits are unremarkable. Soft tissues: Associated mild midline frontal and nasal subcutaneus soft tissue edema. CT CERVICAL SPINE FINDINGS Alignment: Normal. Skull base and vertebrae: Multilevel degenerative change of the spine worse at the C4 through C6 levels with intervertebral disc space narrowing, osteophyte formation, mild facet arthropathy, mild uncovertebral arthropathy. No acute fracture. No aggressive appearing focal osseous lesion or focal pathologic process. Soft tissues and spinal canal: No prevertebral fluid or swelling. No visible canal hematoma. Disc levels:  Maintained. Upper chest: Biapical pleural/pulmonary scarring.  Other: Suggestion of a right posterior tracheal diverticula. IMPRESSION: 1.  No acute intracranial abnormality. 2. Acute comminuted and minimally displaced bilateral nasal bone fracture. 3. No acute displaced fracture or traumatic listhesis of the lumbar spine. Electronically Signed   By: Iven Finn M.D.   On: 05/22/2020 23:18   CT Head Wo Contrast  Result Date: 05/02/2020 CLINICAL DATA:  Status post fall. EXAM: CT HEAD WITHOUT CONTRAST CT CERVICAL SPINE WITHOUT CONTRAST TECHNIQUE: Multidetector CT imaging of the head and cervical spine was performed following the standard protocol without intravenous contrast. Multiplanar CT image reconstructions of the cervical spine were also generated. COMPARISON:  None. FINDINGS: CT HEAD FINDINGS Brain: There is moderate severity cerebral atrophy with widening of the extra-axial spaces and ventricular dilatation. There are areas of decreased attenuation within the white matter tracts of the supratentorial brain, consistent with microvascular disease changes. Vascular: No hyperdense vessel or unexpected calcification. Skull: Normal. Negative for fracture or focal lesion. Sinuses/Orbits: No acute finding. Other: None. CT CERVICAL SPINE FINDINGS Alignment: Normal. Skull base and vertebrae: No acute fracture. No primary bone lesion or focal pathologic process. Soft tissues and spinal canal: No prevertebral fluid or swelling. No visible canal hematoma. Disc levels: Marked severity endplate sclerosis is seen at the levels of C4-C5 and C5-C6, with mild anterior osteophyte formation noted at the levels of C2-C3 C6-C7 and C7-T1. Marked severity intervertebral disc space narrowing is seen at the levels of C4-C5 and C5-C6. Marked severity bilateral multilevel facet joint hypertrophy is noted. Upper chest: Mild biapical scarring and/or atelectasis is seen. Other: N/A IMPRESSION: 1. Moderate severity cerebral atrophy and microvascular disease changes of the supratentorial brain. 2.  Marked severity degenerative changes of the cervical spine without evidence of an acute fracture or subluxation. Electronically Signed   By: Virgina Norfolk M.D.   On: 05/02/2020 22:22   CT Cervical Spine Wo Contrast  Result Date: 05/22/2020 CLINICAL DATA:  Facial trauma status post unwitnessed fall. EXAM: CT HEAD WITHOUT CONTRAST CT MAXILLOFACIAL WITHOUT CONTRAST CT CERVICAL SPINE WITHOUT CONTRAST TECHNIQUE: Multidetector CT imaging of the head, cervical spine, and maxillofacial structures were performed using the standard protocol without intravenous contrast. Multiplanar CT image reconstructions of the cervical spine and maxillofacial structures were also generated. COMPARISON:  CT head 05/02/2020. FINDINGS: CT HEAD FINDINGS Brain: Cerebral ventricle sizes are concordant with the degree of cerebral volume loss. Patchy and confluent areas of decreased attenuation are noted throughout the deep and periventricular white matter of the cerebral hemispheres bilaterally, compatible with chronic microvascular ischemic disease. No evidence of large-territorial acute infarction. Hyperdense foci within the right frontal lobe is noted to be artifactual (6:16, 6:29-30). No parenchymal hemorrhage. No mass lesion. No extra-axial collection. No mass effect or midline shift. No hydrocephalus. Basilar cisterns are patent. Vascular: No hyperdense vessel. Skull: No acute fracture or focal lesion. Other: None. CT MAXILLOFACIAL FINDINGS Osseous: Acute comminuted and minimally displaced bilateral nasal bone fractures. No other acute displaced fracture the face. Sinuses/Orbits: Paranasal sinuses and mastoid air cells are clear. The orbits are unremarkable. Soft tissues: Associated mild midline frontal and nasal subcutaneus soft tissue edema. CT CERVICAL SPINE FINDINGS Alignment: Normal. Skull base and vertebrae: Multilevel degenerative change of the spine worse at the C4 through C6 levels with intervertebral disc space narrowing,  osteophyte formation, mild facet arthropathy, mild uncovertebral arthropathy. No acute fracture. No aggressive appearing focal osseous lesion or focal pathologic process. Soft tissues and spinal canal: No prevertebral fluid or swelling. No visible canal hematoma. Disc levels:  Maintained. Upper chest: Biapical pleural/pulmonary scarring. Other: Suggestion of a right  posterior tracheal diverticula. IMPRESSION: 1. No acute intracranial abnormality. 2. Acute comminuted and minimally displaced bilateral nasal bone fracture. 3. No acute displaced fracture or traumatic listhesis of the lumbar spine. Electronically Signed   By: Iven Finn M.D.   On: 05/22/2020 23:18   CT Cervical Spine Wo Contrast  Result Date: 05/02/2020 CLINICAL DATA:  Status post fall. EXAM: CT HEAD WITHOUT CONTRAST CT CERVICAL SPINE WITHOUT CONTRAST TECHNIQUE: Multidetector CT imaging of the head and cervical spine was performed following the standard protocol without intravenous contrast. Multiplanar CT image reconstructions of the cervical spine were also generated. COMPARISON:  None. FINDINGS: CT HEAD FINDINGS Brain: There is moderate severity cerebral atrophy with widening of the extra-axial spaces and ventricular dilatation. There are areas of decreased attenuation within the white matter tracts of the supratentorial brain, consistent with microvascular disease changes. Vascular: No hyperdense vessel or unexpected calcification. Skull: Normal. Negative for fracture or focal lesion. Sinuses/Orbits: No acute finding. Other: None. CT CERVICAL SPINE FINDINGS Alignment: Normal. Skull base and vertebrae: No acute fracture. No primary bone lesion or focal pathologic process. Soft tissues and spinal canal: No prevertebral fluid or swelling. No visible canal hematoma. Disc levels: Marked severity endplate sclerosis is seen at the levels of C4-C5 and C5-C6, with mild anterior osteophyte formation noted at the levels of C2-C3 C6-C7 and C7-T1. Marked  severity intervertebral disc space narrowing is seen at the levels of C4-C5 and C5-C6. Marked severity bilateral multilevel facet joint hypertrophy is noted. Upper chest: Mild biapical scarring and/or atelectasis is seen. Other: N/A IMPRESSION: 1. Moderate severity cerebral atrophy and microvascular disease changes of the supratentorial brain. 2. Marked severity degenerative changes of the cervical spine without evidence of an acute fracture or subluxation. Electronically Signed   By: Virgina Norfolk M.D.   On: 05/02/2020 22:24   CT ABDOMEN PELVIS W CONTRAST  Result Date: 05/12/2020 CLINICAL DATA:  Status post percutaneous catheter drainage of abscess superior to the dome of the liver and in a subdiaphragmatic location on 05/07/2020. EXAM: CT ABDOMEN AND PELVIS WITH CONTRAST TECHNIQUE: Multidetector CT imaging of the abdomen and pelvis was performed using the standard protocol following bolus administration of intravenous contrast. CONTRAST:  85m OMNIPAQUE IOHEXOL 300 MG/ML  SOLN COMPARISON:  CT of the abdomen and pelvis on 05/06/2020 FINDINGS: Lower chest: Stable trace right pleural fluid and small left pleural effusion. Mild bibasilar atelectasis. Hepatobiliary: Suprahepatic percutaneous drain remains in place with resolution of the suprahepatic/subdiaphragmatic abscess. Trace fluid remains adjacent to the drainage catheter. The liver is unremarkable. The gallbladder has been removed. No biliary ductal dilatation identified. Pancreas: Unremarkable. No pancreatic ductal dilatation or surrounding inflammatory changes. Spleen: Normal in size without focal abnormality. Adrenals/Urinary Tract: Adrenal glands are unremarkable. Kidneys are normal, without renal calculi, focal lesion, or hydronephrosis. Bladder is unremarkable. Stomach/Bowel: Bowel shows no evidence of obstruction, inflammation or perforation. Vascular/Lymphatic: Stable atherosclerosis of the abdominal aorta and iliac arteries without evidence of  aneurysm. No enlarged lymph nodes identified. Reproductive: Uterus and bilateral adnexa are unremarkable. Other: Stable bilateral inguinal hernias containing fat, right greater than left. No ascites. No new fluid collections. Musculoskeletal: No acute or significant osseous findings. IMPRESSION: 1. Resolution of suprahepatic/subdiaphragmatic abscess after percutaneous drain placement. 2. Stable trace right pleural fluid and small left pleural effusion. 3. Stable bilateral inguinal hernias containing fat, right greater than left. 4. Aortic atherosclerosis. Aortic Atherosclerosis (ICD10-I70.0). Electronically Signed   By: GAletta EdouardM.D.   On: 05/12/2020 11:01   CT ABDOMEN PELVIS W CONTRAST  Result Date: 05/06/2020 CLINICAL DATA:  Abdominal pain EXAM: CT ABDOMEN AND PELVIS WITH CONTRAST TECHNIQUE: Multidetector CT imaging of the abdomen and pelvis was performed using the standard protocol following bolus administration of intravenous contrast. CONTRAST:  123m OMNIPAQUE IOHEXOL 300 MG/ML  SOLN COMPARISON:  None. FINDINGS: Lower chest: Small bilateral pleural effusions. Bibasilar atelectasis. Hepatobiliary: No focal hepatic abnormality. There is a fluid collection noted along the dome of the liver. This appears to be subdiaphragmatic on the sagittal images. This may reflect a subcapsular fluid collection. This measures 10.7 x 7.6 x 4.8 cm. This also extends along the lateral surface of the upper liver. Prior cholecystectomy. Pancreas: No focal abnormality or ductal dilatation. Spleen: No focal abnormality.  Normal size. Adrenals/Urinary Tract: No adrenal abnormality. No focal renal abnormality. No stones or hydronephrosis. Urinary bladder is unremarkable. Stomach/Bowel: Postoperative changes in the sigmoid colon. Colonic diverticulosis. No active diverticulitis. Stomach and small bowel decompressed. Vascular/Lymphatic: Aortoiliac atherosclerosis. No evidence of aneurysm or adenopathy. Reproductive: Uterus and  adnexa unremarkable.  No mass. Other: No free fluid or free air. Small bilateral inguinal hernias containing fat. Musculoskeletal: No acute bony abnormality. IMPRESSION: Large fluid collection along the dome of the liver, likely subcapsular fluid collection of unknown etiology. This measures up to 10.7 cm. Colonic diverticulosis.  No active diverticulitis. Small bilateral pleural effusions.  Bibasilar atelectasis. Aortoiliac atherosclerosis. Bilateral inguinal hernias containing fat. Electronically Signed   By: KRolm BaptiseM.D.   On: 05/06/2020 22:37   DG Chest Port 1 View  Result Date: 05/06/2020 CLINICAL DATA:  Questionable sepsis EXAM: PORTABLE CHEST 1 VIEW COMPARISON:  None. FINDINGS: Heart is normal size. Bibasilar atelectasis. No effusions. No acute bony abnormality. IMPRESSION: Bibasilar atelectasis. Electronically Signed   By: KRolm BaptiseM.D.   On: 05/06/2020 19:41   DG Foot Complete Right  Result Date: 05/22/2020 CLINICAL DATA:  Unwitnessed fall at nursing home.  Foot pain. EXAM: RIGHT FOOT COMPLETE - 3+ VIEW COMPARISON:  None. FINDINGS: Suspected nondisplaced fracture of the fourth toe proximal phalanx. Possible fracture of the third toe proximal phalanx. No intra-articular involvement of either fracture. Slight hammertoe deformity of the digits. Osteoarthritis of the first metatarsal phalangeal joint. No erosion or bony destruction. Incidental os navicular. No focal soft tissue abnormalities are seen. IMPRESSION: 1. Suspected nondisplaced fracture of the fourth toe proximal phalanx and possible fracture of the third toe proximal phalanx. 2. Osteoarthritis of the first metatarsophalangeal joint. Electronically Signed   By: MKeith RakeM.D.   On: 05/22/2020 22:45   [place] GUIDED FLUID DRAIN BY CATHETER  Result Date: 05/07/2020 INDICATION: Fluid collection superior to the dome of the liver and subdiaphragmatic in location. EXAM: ULTRASOUND-GUIDED PERCUTANEOUS CATHETER DRAINAGE  PERIHEPATIC ABSCESS MEDICATIONS: No additional medications other than sedative medications documented below. ANESTHESIA/SEDATION: Fentanyl 25 mcg IV; Versed 0.5 mg IV Moderate Sedation Time:  12 minutes. The patient was continuously monitored during the procedure by the interventional radiology nurse under my direct supervision. COMPLICATIONS: None immediate. PROCEDURE: Informed written consent was obtained from the patient after a thorough discussion of the procedural risks, benefits and alternatives. All questions were addressed. Maximal Sterile Barrier Technique was utilized including caps, mask, sterile gowns, sterile gloves, sterile drape, hand hygiene and skin antiseptic. A timeout was performed prior to the initiation of the procedure. Ultrasound was used to localize a fluid collection superior to the dome of the liver. Under direct ultrasound guidance, and following infiltration of 1% lidocaine for local anesthesia, a 5 [place] centesis catheter was  inserted over a 19 gauge needle into the fluid collection from a lateral approach. Aspiration of fluid was performed. A 20 mL sample was collected and sent for culture analysis. The Yueh catheter was removed over a guidewire. The tract was dilated to 10 [place]. A 10 French percutaneous drainage catheter was then advanced into the collection. Catheter position was confirmed by ultrasound. The catheter was flushed with saline and connected to a suction bulb. It was secured at the skin with a Prolene retention suture and StatLock device. FINDINGS: Aspiration at the level of the lentiform shaped suprahepatic fluid collection yielded grossly purulent fluid. Due to nature of fluid, a percutaneous drainage catheter was placed. There is good return of fluid from the drain after placement and attachment to suction bulb drainage. IMPRESSION: Ultrasound-guided aspiration of the right-sided suprahepatic/infradiaphragmatic fluid collection yielded grossly purulent fluid.  A sample was sent for culture analysis. A 10 French percutaneous drainage catheter was placed and attached to suction bulb drainage. Electronically Signed   By: Aletta Edouard M.D.   On: 05/07/2020 15:51   CT Maxillofacial Wo Contrast  Result Date: 05/22/2020 CLINICAL DATA:  Facial trauma status post unwitnessed fall. EXAM: CT HEAD WITHOUT CONTRAST CT MAXILLOFACIAL WITHOUT CONTRAST CT CERVICAL SPINE WITHOUT CONTRAST TECHNIQUE: Multidetector CT imaging of the head, cervical spine, and maxillofacial structures were performed using the standard protocol without intravenous contrast. Multiplanar CT image reconstructions of the cervical spine and maxillofacial structures were also generated. COMPARISON:  CT head 05/02/2020. FINDINGS: CT HEAD FINDINGS Brain: Cerebral ventricle sizes are concordant with the degree of cerebral volume loss. Patchy and confluent areas of decreased attenuation are noted throughout the deep and periventricular white matter of the cerebral hemispheres bilaterally, compatible with chronic microvascular ischemic disease. No evidence of large-territorial acute infarction. Hyperdense foci within the right frontal lobe is noted to be artifactual (6:16, 6:29-30). No parenchymal hemorrhage. No mass lesion. No extra-axial collection. No mass effect or midline shift. No hydrocephalus. Basilar cisterns are patent. Vascular: No hyperdense vessel. Skull: No acute fracture or focal lesion. Other: None. CT MAXILLOFACIAL FINDINGS Osseous: Acute comminuted and minimally displaced bilateral nasal bone fractures. No other acute displaced fracture the face. Sinuses/Orbits: Paranasal sinuses and mastoid air cells are clear. The orbits are unremarkable. Soft tissues: Associated mild midline frontal and nasal subcutaneus soft tissue edema. CT CERVICAL SPINE FINDINGS Alignment: Normal. Skull base and vertebrae: Multilevel degenerative change of the spine worse at the C4 through C6 levels with intervertebral disc  space narrowing, osteophyte formation, mild facet arthropathy, mild uncovertebral arthropathy. No acute fracture. No aggressive appearing focal osseous lesion or focal pathologic process. Soft tissues and spinal canal: No prevertebral fluid or swelling. No visible canal hematoma. Disc levels:  Maintained. Upper chest: Biapical pleural/pulmonary scarring. Other: Suggestion of a right posterior tracheal diverticula. IMPRESSION: 1. No acute intracranial abnormality. 2. Acute comminuted and minimally displaced bilateral nasal bone fracture. 3. No acute displaced fracture or traumatic listhesis of the lumbar spine. Electronically Signed   By: Iven Finn M.D.   On: 05/22/2020 23:18    Assessment/Plan: Hypokalemia 05/29/20 Na 142, K 3.2, Bun 19, creat 1.53, eGFR 31, wbc 5.5, Hgb 12.5, plt 206, neutrophils 62.1%, encourage oral fluid intake, decrease Donepezil, repeat CMP/eGFR in one week.    CKD (chronic kidney disease) stage 3, GFR 30-59 ml/min (HCC) 05/29/20 Na 142, K 3.2, Bun 19, creat 1.53, eGFR 31, wbc 5.5, Hgb 12.5, plt 206, neutrophils 62.1% Encourage oral fluid intake, repeat CMP/eGFR one week  Elevated TSH Elevated  TSH11/3/21 TSH 5.78, repeat TSH  Depression with anxiety Anxiety/depression, takes prn  Seroquel  Vascular dementia (HCC) Dementia, resides in memory care unit [facility], takes Memantine, will decrease Donepezil to 17m qd due to poor appetite.    Intraabdominal fluid collection Hospitalized 05/06/20-05/14/20 CT abd showed 10.7u cm fluid collectin around the dome of the liver, repeated CT abd showed resolution following underwent abscess aspiration 05/07/20 and removed drain 05/13/20, treated with 7 day course of Zosyn for liver abscess, ID no further tx or f/u needed.    Nasal bone fracture On set 05/22/20 closed fracture of nasal bone, f/u Dr. SFredric DineDO otolaryngology.  Nondisplaced fracture of distal phalanx of right lesser toe(s), subsequent encounter for fracture with  routine healing  X-ray R foot showed closed nondisplaced fracture of proximal phalanx of lesser toes 3rd, 4th  of right foot, placed in boot, f/u Dr. BRolena Infante       Family/ staff Communication: plan of care reviewed with the patient and charge nurse.   Labs/tests ordered:  BMP/eGFR in am  Time spend 35 minutes.

## 2020-05-30 NOTE — Assessment & Plan Note (Signed)
Elevated TSH11/3/21 TSH 5.78, repeat TSH

## 2020-05-31 ENCOUNTER — Encounter: Payer: Self-pay | Admitting: Nurse Practitioner

## 2020-06-06 LAB — HEPATIC FUNCTION PANEL
ALT: 13 (ref 7–35)
AST: 16 (ref 13–35)
Alkaline Phosphatase: 68 (ref 25–125)

## 2020-06-06 LAB — COMPREHENSIVE METABOLIC PANEL
Albumin: 3.5 (ref 3.5–5.0)
Calcium: 10 (ref 8.7–10.7)
GFR calc Af Amer: 43
GFR calc non Af Amer: 37
Globulin: 3.2

## 2020-06-06 LAB — BASIC METABOLIC PANEL
BUN: 16 (ref 4–21)
CO2: 30 — AB (ref 13–22)
Chloride: 105 (ref 99–108)
Creatinine: 1.3 — AB (ref 0.5–1.1)
Glucose: 91
Potassium: 4 (ref 3.4–5.3)
Sodium: 142 (ref 137–147)

## 2020-06-07 ENCOUNTER — Encounter: Payer: Self-pay | Admitting: Nurse Practitioner

## 2020-06-07 ENCOUNTER — Non-Acute Institutional Stay (SKILLED_NURSING_FACILITY): Payer: Medicare Other | Admitting: Nurse Practitioner

## 2020-06-07 DIAGNOSIS — R188 Other ascites: Secondary | ICD-10-CM | POA: Diagnosis not present

## 2020-06-07 DIAGNOSIS — N1832 Chronic kidney disease, stage 3b: Secondary | ICD-10-CM

## 2020-06-07 DIAGNOSIS — F01518 Vascular dementia, unspecified severity, with other behavioral disturbance: Secondary | ICD-10-CM

## 2020-06-07 DIAGNOSIS — F0151 Vascular dementia with behavioral disturbance: Secondary | ICD-10-CM

## 2020-06-07 DIAGNOSIS — S92534D Nondisplaced fracture of distal phalanx of right lesser toe(s), subsequent encounter for fracture with routine healing: Secondary | ICD-10-CM

## 2020-06-07 DIAGNOSIS — F418 Other specified anxiety disorders: Secondary | ICD-10-CM | POA: Diagnosis not present

## 2020-06-07 DIAGNOSIS — S022XXD Fracture of nasal bones, subsequent encounter for fracture with routine healing: Secondary | ICD-10-CM

## 2020-06-07 DIAGNOSIS — R7989 Other specified abnormal findings of blood chemistry: Secondary | ICD-10-CM

## 2020-06-07 NOTE — Progress Notes (Signed)
Location:   SNF [place] Room Number: 878 MVEHM of Service:  SNF (31) Provider: [place] Tomesha Sargent NP  [name], MD  Patient Care Team: [name], MD as PCP - General (Internal Medicine)  Extended Emergency Contact Information Primary Emergency Contact: Bertrum Sol, [facility]) [facility] Address: [address]          [place], [place] [address] [name] of [place] Phone: [phone] Relation: Relative Secondary Emergency Contact: [name] Mobile Phone: [phone] Relation: Relative  Code Status: DNR Goals of care: Advanced Directive information Advanced Directives 06/07/2020  Does Patient Have a Medical Advance Directive? Yes  Type of Paramedic of [place];Living will;Out of facility DNR (pink MOST or yellow form)  Does patient want to make changes to medical advance directive? No - Patient declined  Copy of [place] in Chart? Yes - validated most recent copy scanned in chart (See row information)  Pre-existing out of facility DNR order (yellow form or pink MOST form) -     Chief Complaint  Patient presents with  . Acute Visit    Behavioral Issues.    HPI:  Pt is a 87 y.o. female seen today for an acute visit for increased episodes of restlessness, agitation, combative behaviors.     On set 05/22/20 closed fracture of nasal bone, f/u Dr. Fredric Dine DO otolaryngology.   05/22/20 X-ray R foot showed closed nondisplaced fracture of proximal phalanx of lesser toes 3rd, 4th of right foot, 06/06/20 Ortho R 2nd fx, stable, regular shoe, WBAT.  Hospitalized 12/12/211/12/22 Ortho R 2nd fx, stable, regular shoe, WBAT. 1-12/20/21 CT abd showed 10.7u cm fluid collectin around the dome of the liver, repeated CT abd showed resolution following underwent abscess aspiration 05/07/20 and removed drain 05/13/20, treated with 7 day course of Zosyn for liver abscess, ID no further tx or f/u needed.   Dementia, resides in memory care unit [facility], takes Memantine, Donepezil, TSH 2.63 06/05/20 Anxiety/depression, takesprnSeroquel, Clonazepam qhs.   Elevated TSH2.63 06/05/20  CKD Bun/creat 16/1.3, eGFR 37 06/06/20  Past Medical History:  Diagnosis Date  . Arthritis   . Dementia ([place])   . Hx of concussion    [place]  . Hyperlipidemia   . Vitamin D deficiency, unspecified    Past Surgical History:  Procedure Laterality Date  . CHOLECYSTECTOMY      Allergies  Allergen Reactions  . Fish-Derived Products   . Pravachol [Pravastatin Sodium]     Allergies as of 06/07/2020      Reactions   Fish-derived Products    Pravachol [pravastatin Sodium]       Medication List       Accurate as of June 07, 2020 11:59 PM. If you have any questions, ask your nurse or doctor.        STOP taking these medications   QUEtiapine 25 MG tablet Commonly known as: SEROQUEL Stopped by: Cariah Salatino X Markez Dowland, NP   triamcinolone 0.1 % Commonly known as: KENALOG Stopped by: Sarahlynn Cisnero X Syniah Berne, NP     TAKE these medications   CALCIUM-VITAMIN D PO Take 1 tablet by mouth in the morning and at bedtime. 620m (1,506m - 400 unit   clonazePAM 0.5 MG tablet Commonly known as: KLONOPIN Take 1 tablet (0.5 mg total) by mouth at bedtime for 5 doses.   donepezil 10 MG tablet Commonly known as: ARICEPT Take 10 mg by mouth at bedtime.   memantine 10 MG tablet Commonly known as: NAMENDA Take  10 mg by mouth 2 (two) times daily.   POLYVINYL ALCOHOL-POVIDONE OP Place 2 drops into both eyes 3 (three) times daily. drops; 0.5-0.6 %; amt: 2 drops each eye; ophthalmic (eye)       Review of Systems  Constitutional: Negative for appetite change, fatigue and fever.  HENT: Positive for hearing loss. Negative for congestion and voice change.   Eyes: Negative for visual disturbance.       Dry eyes  Respiratory: Negative for cough.   Cardiovascular: Negative for leg  swelling.  Gastrointestinal: Negative for abdominal pain and constipation.  Genitourinary: Negative for dysuria and urgency.  Musculoskeletal: Positive for arthralgias and gait problem.       Nose bone fx, R 3rd, 4th toe fxs.   Skin: Negative for color change.       Facial contusion, right 3rd, 4th  toe fx  Neurological: Negative for dizziness, speech difficulty, weakness and headaches.       Dementia  Psychiatric/Behavioral: Positive for agitation and confusion. Negative for behavioral problems and sleep disturbance. The patient is nervous/anxious.     Immunization History  Administered Date(s) Administered  . Influenza-Unspecified 02/26/2019, 03/07/2020  . Moderna Sars-Covid-2 Vaccination 05/28/2019, 06/25/2019, 04/03/2020  . Pneumococcal-Unspecified 05/22/2016   Pertinent  Health Maintenance Due  Topic Date Due  . DEXA SCAN  Never done  . PNA vac Low Risk Adult (2 of 2 - PCV13) 05/22/2017  . INFLUENZA VACCINE  Completed   No flowsheet data found. Functional Status Survey:    Vitals:   06/07/20 1436  BP: 134/80  Pulse: 94  Resp: 18  Temp: (!) 97.5 F (36.4 C)  SpO2: 97%  Weight: 148 lb 6.4 oz (67.3 kg)  Height: _0  (1.676 m)   Body mass index is 23.95 kg/m. Physical Exam Vitals and nursing note reviewed.  Constitutional:      Appearance: Normal appearance.  HENT:     Head: Normocephalic and atraumatic.     Mouth/Throat:     Mouth: Mucous membranes are moist.  Eyes:     Extraocular Movements: Extraocular movements intact.     Conjunctiva/sclera: Conjunctivae normal.     Pupils: Pupils are equal, round, and reactive to light.  Cardiovascular:     Rate and Rhythm: Normal rate and regular rhythm.     Heart sounds: No murmur heard.   Pulmonary:     Effort: Pulmonary effort is normal.     Breath sounds: No rales.  Abdominal:     Palpations: Abdomen is soft.     Tenderness: There is no abdominal tenderness.  Musculoskeletal:     Cervical back: Normal  range of motion and neck supple.     Right lower leg: No edema.     Left lower leg: No edema.  Skin:    General: Skin is warm and dry.     Comments: Nose bridge/facial contusion, right 3rd, 4th  toe fx  Neurological:     General: No focal deficit present.     Mental Status: She is alert.     Gait: Gait abnormal.     Comments: Oriented to person  Psychiatric:     Comments: Confused, pleasant, conversing     Labs reviewed: Recent Labs    05/11/20 0532 05/12/20 0525 05/13/20 0516 05/14/20 0440 05/29/20 0000 06/06/20 0000  NA 139 141 139 139 142 142  K 3.7 3.5 3.4* 4.0 3.2* 4.0  CL 103 103 102 104 103 105  CO2 _1 30* 30*  GLUCOSE 88 87 113* 80  --   --   BUN _0 CREATININE 0.96 1.09* 1.11* 1.08* 1.5* 1.3*  CALCIUM 8.8* 8.7* 9.0 9.0 10.1 10.0  MG 2.2 2.2 2.1  --   --   --   PHOS 3.9 3.5 3.4  --   --   --    Recent Labs    05/08/20 0446 05/10/20 0511 05/13/20 0516 05/29/20 0000 06/06/20 0000  AST _1 ALT 64* 38 _2 ALKPHOS 152* 141* 112 76 68  BILITOT 0.6 0.6 0.5  --   --   PROT 5.7* 5.7* 6.5  --   --   ALBUMIN 2.4* 2.3* 2.7* 3.7 3.5   Recent Labs    03/28/20 0000 03/28/20 0000 05/02/20 2225 05/06/20 1937 05/08/20 0704 05/10/20 0511 05/12/20 0525 05/13/20 0516 05/29/20 0000  WBC 5.4   < > 7.3 6.9   < > 6.4 7.5 7.3 5.5  NEUTROABS 3,202.00  --  6.5 5.3  --   --   --   --   --   HGB 13.2  --  11.4* 11.3*   < > 10.5* 10.9* 11.9* 12.5  HCT 40  --  36.1 35.3*   < > 32.7* 33.9* 36.4 37  MCV  --    < > 93.0 93.1   < > 92.1 92.9 92.9  --   PLT 199  --  132* 117*   < > 228 242 266 206   < > = values in this interval not displayed.   Lab Results  Component Value Date   TSH 5.78 03/28/2020   No results found for: HGBA1C No results found for: CHOL, HDL, LDLCALC, LDLDIRECT, TRIG, CHOLHDL  Significant Diagnostic Results in last 30 days:  CT Head Wo Contrast  Result Date: 05/22/2020 CLINICAL DATA:  Facial trauma  status post unwitnessed fall. EXAM: CT HEAD WITHOUT CONTRAST CT MAXILLOFACIAL WITHOUT CONTRAST CT CERVICAL SPINE WITHOUT CONTRAST TECHNIQUE: Multidetector CT imaging of the head, cervical spine, and maxillofacial structures were performed using the standard protocol without intravenous contrast. Multiplanar CT image reconstructions of the cervical spine and maxillofacial structures were also generated. COMPARISON:  CT head 05/02/2020. FINDINGS: CT HEAD FINDINGS Brain: Cerebral ventricle sizes are concordant with the degree of cerebral volume loss. Patchy and confluent areas of decreased attenuation are noted throughout the deep and periventricular white matter of the cerebral hemispheres bilaterally, compatible with chronic microvascular ischemic disease. No evidence of large-territorial acute infarction. Hyperdense foci within the right frontal lobe is noted to be artifactual (6:16, 6:29-30). No parenchymal hemorrhage. No mass lesion. No extra-axial collection. No mass effect or midline shift. No hydrocephalus. Basilar cisterns are patent. Vascular: No hyperdense vessel. Skull: No acute fracture or focal lesion. Other: None. CT MAXILLOFACIAL FINDINGS Osseous: Acute comminuted and minimally displaced bilateral nasal bone fractures. No other acute displaced fracture the face. Sinuses/Orbits: Paranasal sinuses and mastoid air cells are clear. The orbits are unremarkable. Soft tissues: Associated mild midline frontal and nasal subcutaneus soft tissue edema. CT CERVICAL SPINE FINDINGS Alignment: Normal. Skull base and vertebrae: Multilevel degenerative change of the spine worse at the C4 through C6 levels with intervertebral disc space narrowing, osteophyte formation, mild facet arthropathy, mild uncovertebral arthropathy. No acute fracture. No aggressive appearing focal osseous lesion or focal pathologic process. Soft tissues and spinal canal: No prevertebral fluid or swelling. No visible canal hematoma. Disc levels:   Maintained. Upper chest: Biapical  pleural/pulmonary scarring. Other: Suggestion of a right posterior tracheal diverticula. IMPRESSION: 1. No acute intracranial abnormality. 2. Acute comminuted and minimally displaced bilateral nasal bone fracture. 3. No acute displaced fracture or traumatic listhesis of the lumbar spine. Electronically Signed   By: Iven Finn M.D.   On: 05/22/2020 23:18   CT Cervical Spine Wo Contrast  Result Date: 05/22/2020 CLINICAL DATA:  Facial trauma status post unwitnessed fall. EXAM: CT HEAD WITHOUT CONTRAST CT MAXILLOFACIAL WITHOUT CONTRAST CT CERVICAL SPINE WITHOUT CONTRAST TECHNIQUE: Multidetector CT imaging of the head, cervical spine, and maxillofacial structures were performed using the standard protocol without intravenous contrast. Multiplanar CT image reconstructions of the cervical spine and maxillofacial structures were also generated. COMPARISON:  CT head 05/02/2020. FINDINGS: CT HEAD FINDINGS Brain: Cerebral ventricle sizes are concordant with the degree of cerebral volume loss. Patchy and confluent areas of decreased attenuation are noted throughout the deep and periventricular white matter of the cerebral hemispheres bilaterally, compatible with chronic microvascular ischemic disease. No evidence of large-territorial acute infarction. Hyperdense foci within the right frontal lobe is noted to be artifactual (6:16, 6:29-30). No parenchymal hemorrhage. No mass lesion. No extra-axial collection. No mass effect or midline shift. No hydrocephalus. Basilar cisterns are patent. Vascular: No hyperdense vessel. Skull: No acute fracture or focal lesion. Other: None. CT MAXILLOFACIAL FINDINGS Osseous: Acute comminuted and minimally displaced bilateral nasal bone fractures. No other acute displaced fracture the face. Sinuses/Orbits: Paranasal sinuses and mastoid air cells are clear. The orbits are unremarkable. Soft tissues: Associated mild midline frontal and nasal subcutaneus  soft tissue edema. CT CERVICAL SPINE FINDINGS Alignment: Normal. Skull base and vertebrae: Multilevel degenerative change of the spine worse at the C4 through C6 levels with intervertebral disc space narrowing, osteophyte formation, mild facet arthropathy, mild uncovertebral arthropathy. No acute fracture. No aggressive appearing focal osseous lesion or focal pathologic process. Soft tissues and spinal canal: No prevertebral fluid or swelling. No visible canal hematoma. Disc levels:  Maintained. Upper chest: Biapical pleural/pulmonary scarring. Other: Suggestion of a right posterior tracheal diverticula. IMPRESSION: 1. No acute intracranial abnormality. 2. Acute comminuted and minimally displaced bilateral nasal bone fracture. 3. No acute displaced fracture or traumatic listhesis of the lumbar spine. Electronically Signed   By: Iven Finn M.D.   On: 05/22/2020 23:18   CT ABDOMEN PELVIS W CONTRAST  Result Date: 05/12/2020 CLINICAL DATA:  Status post percutaneous catheter drainage of abscess superior to the dome of the liver and in a subdiaphragmatic location on 05/07/2020. EXAM: CT ABDOMEN AND PELVIS WITH CONTRAST TECHNIQUE: Multidetector CT imaging of the abdomen and pelvis was performed using the standard protocol following bolus administration of intravenous contrast. CONTRAST:  69m OMNIPAQUE IOHEXOL 300 MG/ML  SOLN COMPARISON:  CT of the abdomen and pelvis on 05/06/2020 FINDINGS: Lower chest: Stable trace right pleural fluid and small left pleural effusion. Mild bibasilar atelectasis. Hepatobiliary: Suprahepatic percutaneous drain remains in place with resolution of the suprahepatic/subdiaphragmatic abscess. Trace fluid remains adjacent to the drainage catheter. The liver is unremarkable. The gallbladder has been removed. No biliary ductal dilatation identified. Pancreas: Unremarkable. No pancreatic ductal dilatation or surrounding inflammatory changes. Spleen: Normal in size without focal abnormality.  Adrenals/Urinary Tract: Adrenal glands are unremarkable. Kidneys are normal, without renal calculi, focal lesion, or hydronephrosis. Bladder is unremarkable. Stomach/Bowel: Bowel shows no evidence of obstruction, inflammation or perforation. Vascular/Lymphatic: Stable atherosclerosis of the abdominal aorta and iliac arteries without evidence of aneurysm. No enlarged lymph nodes identified. Reproductive: Uterus and bilateral adnexa are unremarkable. Other: Stable  bilateral inguinal hernias containing fat, right greater than left. No ascites. No new fluid collections. Musculoskeletal: No acute or significant osseous findings. IMPRESSION: 1. Resolution of suprahepatic/subdiaphragmatic abscess after percutaneous drain placement. 2. Stable trace right pleural fluid and small left pleural effusion. 3. Stable bilateral inguinal hernias containing fat, right greater than left. 4. Aortic atherosclerosis. Aortic Atherosclerosis (ICD10-I70.0). Electronically Signed   By: Aletta Edouard M.D.   On: 05/12/2020 11:01   DG Foot Complete Right  Result Date: 05/22/2020 CLINICAL DATA:  Unwitnessed fall at nursing home.  Foot pain. EXAM: RIGHT FOOT COMPLETE - 3+ VIEW COMPARISON:  None. FINDINGS: Suspected nondisplaced fracture of the fourth toe proximal phalanx. Possible fracture of the third toe proximal phalanx. No intra-articular involvement of either fracture. Slight hammertoe deformity of the digits. Osteoarthritis of the first metatarsal phalangeal joint. No erosion or bony destruction. Incidental os navicular. No focal soft tissue abnormalities are seen. IMPRESSION: 1. Suspected nondisplaced fracture of the fourth toe proximal phalanx and possible fracture of the third toe proximal phalanx. 2. Osteoarthritis of the first metatarsophalangeal joint. Electronically Signed   By: Keith Rake M.D.   On: 05/22/2020 22:45   CT Maxillofacial Wo Contrast  Result Date: 05/22/2020 CLINICAL DATA:  Facial trauma status post  unwitnessed fall. EXAM: CT HEAD WITHOUT CONTRAST CT MAXILLOFACIAL WITHOUT CONTRAST CT CERVICAL SPINE WITHOUT CONTRAST TECHNIQUE: Multidetector CT imaging of the head, cervical spine, and maxillofacial structures were performed using the standard protocol without intravenous contrast. Multiplanar CT image reconstructions of the cervical spine and maxillofacial structures were also generated. COMPARISON:  CT head 05/02/2020. FINDINGS: CT HEAD FINDINGS Brain: Cerebral ventricle sizes are concordant with the degree of cerebral volume loss. Patchy and confluent areas of decreased attenuation are noted throughout the deep and periventricular white matter of the cerebral hemispheres bilaterally, compatible with chronic microvascular ischemic disease. No evidence of large-territorial acute infarction. Hyperdense foci within the right frontal lobe is noted to be artifactual (6:16, 6:29-30). No parenchymal hemorrhage. No mass lesion. No extra-axial collection. No mass effect or midline shift. No hydrocephalus. Basilar cisterns are patent. Vascular: No hyperdense vessel. Skull: No acute fracture or focal lesion. Other: None. CT MAXILLOFACIAL FINDINGS Osseous: Acute comminuted and minimally displaced bilateral nasal bone fractures. No other acute displaced fracture the face. Sinuses/Orbits: Paranasal sinuses and mastoid air cells are clear. The orbits are unremarkable. Soft tissues: Associated mild midline frontal and nasal subcutaneus soft tissue edema. CT CERVICAL SPINE FINDINGS Alignment: Normal. Skull base and vertebrae: Multilevel degenerative change of the spine worse at the C4 through C6 levels with intervertebral disc space narrowing, osteophyte formation, mild facet arthropathy, mild uncovertebral arthropathy. No acute fracture. No aggressive appearing focal osseous lesion or focal pathologic process. Soft tissues and spinal canal: No prevertebral fluid or swelling. No visible canal hematoma. Disc levels:  Maintained.  Upper chest: Biapical pleural/pulmonary scarring. Other: Suggestion of a right posterior tracheal diverticula. IMPRESSION: 1. No acute intracranial abnormality. 2. Acute comminuted and minimally displaced bilateral nasal bone fracture. 3. No acute displaced fracture or traumatic listhesis of the lumbar spine. Electronically Signed   By: Iven Finn M.D.   On: 05/22/2020 23:18    Assessment/Plan: Depression with anxiety Anxiety/depression, continue prnSeroquel x 14 days, continue Clonazepam hs.   Elevated TSH Elevated TSH2.63 06/05/20    Vascular dementia (HCC) Dementia, resides in memory care unit [facility], takes Memantine, Donepezil, TSH 2.63 06/05/20   Intraabdominal fluid collection Liver abscess resolved,  ID no further tx or f/u needed.    Nasal  bone fracture Healing, On set 05/22/20 closed fracture of nasal bone, f/u Dr. Fredric Dine DO otolaryngology.  Nondisplaced fracture of distal phalanx of right lesser toe(s), subsequent encounter for fracture with routine healing 05/22/20 X-ray R foot showed closed nondisplaced fracture of proximal phalanx of lesser toes 3rd, 4th of right foot, 06/06/20 Ortho R 2nd fx, stable, regular shoe, WBAT.   CKD (chronic kidney disease) stage 3, GFR 30-59 ml/min (HCC) CKD Bun/creat 16/1.3, eGFR 37 06/06/20     Family/ staff Communication: plan of care reviewed with the patient and charge nurse.   Labs/tests ordered:  None  Time spend 35 minutes.

## 2020-06-07 NOTE — Assessment & Plan Note (Signed)
05/22/20 X-ray R foot showed closed nondisplaced fracture of proximal phalanx of lesser toes 3rd, 4th of right foot, 06/06/20 Ortho R 2nd fx, stable, regular shoe, WBAT.

## 2020-06-07 NOTE — Assessment & Plan Note (Signed)
Dementia, resides in memory care unit [facility], takes Memantine, Donepezil, TSH 2.63 06/05/20

## 2020-06-07 NOTE — Assessment & Plan Note (Signed)
Liver abscess resolved,  ID no further tx or f/u needed.

## 2020-06-07 NOTE — Assessment & Plan Note (Signed)
Healing, On set 05/22/20 closed fracture of nasal bone, f/u Dr. Marene Lenz DO otolaryngology.

## 2020-06-07 NOTE — Assessment & Plan Note (Addendum)
Anxiety/depression, continue prnSeroquel x 14 days, continue Clonazepam hs.

## 2020-06-07 NOTE — Assessment & Plan Note (Signed)
Elevated TSH2.63 06/05/20

## 2020-06-08 ENCOUNTER — Encounter: Payer: Self-pay | Admitting: Nurse Practitioner

## 2020-06-08 NOTE — Assessment & Plan Note (Signed)
CKD Bun/creat 16/1.3, eGFR 37 06/06/20  

## 2020-06-22 ENCOUNTER — Non-Acute Institutional Stay (SKILLED_NURSING_FACILITY): Payer: Medicare Other | Admitting: Nurse Practitioner

## 2020-06-22 ENCOUNTER — Encounter: Payer: Self-pay | Admitting: Nurse Practitioner

## 2020-06-22 DIAGNOSIS — S92534D Nondisplaced fracture of distal phalanx of right lesser toe(s), subsequent encounter for fracture with routine healing: Secondary | ICD-10-CM

## 2020-06-22 DIAGNOSIS — R188 Other ascites: Secondary | ICD-10-CM

## 2020-06-22 DIAGNOSIS — F418 Other specified anxiety disorders: Secondary | ICD-10-CM

## 2020-06-22 DIAGNOSIS — F0151 Vascular dementia with behavioral disturbance: Secondary | ICD-10-CM

## 2020-06-22 DIAGNOSIS — S022XXD Fracture of nasal bones, subsequent encounter for fracture with routine healing: Secondary | ICD-10-CM

## 2020-06-22 DIAGNOSIS — F01518 Vascular dementia, unspecified severity, with other behavioral disturbance: Secondary | ICD-10-CM

## 2020-06-22 DIAGNOSIS — N1832 Chronic kidney disease, stage 3b: Secondary | ICD-10-CM | POA: Diagnosis not present

## 2020-06-22 NOTE — Assessment & Plan Note (Signed)
On set 05/22/20 closed fracture of nasal bone, f/u Dr. Marene Lenz DO otolaryngology.

## 2020-06-22 NOTE — Assessment & Plan Note (Signed)
Anxiety/depression, takesprnSeroquel, Clonazepam qhs.

## 2020-06-22 NOTE — Assessment & Plan Note (Signed)
Dementia, resides in memory care unit [facility], takes Memantine, Donepezil, TSH 2.63 06/05/20, Elevated TSH2.63 06/05/20

## 2020-06-22 NOTE — Progress Notes (Signed)
Location:    [place] Room Number: [place]:  SNF (31) Provider: Lennie Odor Gaynor Ferreras NP  [name], MD  Patient Care Team: [name], MD as PCP - General (Internal Medicine)  Extended Emergency Contact Information Primary Emergency Contact: Bertrum Sol [facility] Address: [address]          [place], [place] [address] [name] of [place] Phone: [phone] Relation: Relative Secondary Emergency Contact: [name] Mobile Phone: [phone] Relation: Relative  Code Status:  DNR Goals of care: Advanced Directive information Advanced Directives 06/07/2020  Does Patient Have a Medical Advance Directive? Yes  Type of Paramedic of [place];Living will;Out of facility DNR (pink MOST or yellow form)  Does patient want to make changes to medical advance directive? No - Patient declined  Copy of [place] in Chart? Yes - validated most recent copy scanned in chart (See row information)  Pre-existing out of facility DNR order (yellow form or pink MOST form) -     Chief Complaint  Patient presents with  . Medical Management of Chronic Issues  . Health Maintenance    TDAP, PCV13, Dexa scan    HPI:  Pt is a 87 y.o. female seen today for medical management of chronic diseases.    On set 05/22/20 closed fracture of nasal bone, f/u Dr. Fredric Dine DO otolaryngology.              05/22/20 X-ray R foot showed closed nondisplaced fracture of proximal phalanx of lesser toes 3rd, 4th of right foot, 06/06/20 Ortho R 2nd fx, stable, regular shoe, WBAT.  Hospitalized 12/12/211/12/22 Ortho R 2nd fx, stable, regular shoe, WBAT.   1-12/20/21 CT abd showed 10.7u cm fluid collectin around the dome of the liver, repeated CT abd showed resolution following underwent abscess aspiration 05/07/20 and removed drain 05/13/20, treated with 7 day course of Zosyn for liver abscess, ID no further tx  or f/u needed.  Dementia, resides in memory care unit [facility], takes Memantine, Donepezil, TSH 2.63 06/05/20, Elevated TSH2.63 06/05/20 Anxiety/depression, takesprnSeroquel, Clonazepam qhs.   CKD Bun/creat 16/1.3, eGFR 37 06/06/20   Past Medical History:  Diagnosis Date  . Arthritis   . Dementia ([place])   . Hx of concussion    [place]  . Hyperlipidemia   . Vitamin D deficiency, unspecified    Past Surgical History:  Procedure Laterality Date  . CHOLECYSTECTOMY      Allergies  Allergen Reactions  . Fish-Derived Products   . Pravachol [Pravastatin Sodium]     Allergies as of 06/22/2020      Reactions   Fish-derived Products    Pravachol [pravastatin Sodium]       Medication List       Accurate as of June 22, 2020 11:59 PM. If you have any questions, ask your nurse or doctor.        CALCIUM-VITAMIN D PO Take 1 tablet by mouth in the morning and at bedtime. 619m (1,509m - 400 unit   clonazePAM 0.5 MG tablet Commonly known as: KLONOPIN Take 1 tablet (0.5 mg total) by mouth at bedtime for 5 doses.   donepezil 10 MG tablet Commonly known as: ARICEPT Take 10 mg by mouth at bedtime.   lactose free nutrition Liqd Take 237 mLs by mouth daily.   memantine 10 MG tablet Commonly known as: NAMENDA Take 10 mg by mouth 2 (two) times daily.   POLYVINYL ALCOHOL-POVIDONE OP  Place 2 drops into both eyes 3 (three) times daily. drops; 0.5-0.6 %; amt: 2 drops each eye; ophthalmic (eye)       Review of Systems  Constitutional: Negative for appetite change, fatigue and fever.  HENT: Positive for hearing loss. Negative for congestion and voice change.   Eyes: Negative for visual disturbance.       Dry eyes  Respiratory: Negative for cough.   Cardiovascular: Negative for leg swelling.  Gastrointestinal: Negative for abdominal pain and constipation.  Genitourinary: Negative for dysuria and urgency.  Musculoskeletal: Positive  for arthralgias and gait problem.       Nose bone fx, R 3rd, 4th toe fxs.   Skin: Negative for color change.  Neurological: Negative for speech difficulty, weakness and headaches.       Dementia  Psychiatric/Behavioral: Positive for agitation and confusion. Negative for behavioral problems and sleep disturbance. The patient is nervous/anxious.     Immunization History  Administered Date(s) Administered  . Influenza-Unspecified 02/26/2019, 03/07/2020  . Moderna Sars-Covid-2 Vaccination 05/28/2019, 06/25/2019, 04/03/2020  . Pneumococcal-Unspecified 05/22/2016   Pertinent  Health Maintenance Due  Topic Date Due  . DEXA SCAN  Never done  . PNA vac Low Risk Adult (2 of 2 - PCV13) 05/22/2017  . INFLUENZA VACCINE  Completed   No flowsheet data found. Functional Status Survey:    Vitals:   06/22/20 1455  BP: 138/84  Pulse: 88  Resp: 16  Temp: 99 F (37.2 C)  SpO2: 96%  Weight: 149 lb 9.6 oz (67.9 kg)  Height: '5\' 6"'  (1.676 m)   Body mass index is 24.15 kg/m. Physical Exam Vitals and nursing note reviewed.  Constitutional:      Appearance: Normal appearance.  HENT:     Head: Normocephalic and atraumatic.     Mouth/Throat:     Mouth: Mucous membranes are moist.  Eyes:     Extraocular Movements: Extraocular movements intact.     Conjunctiva/sclera: Conjunctivae normal.     Pupils: Pupils are equal, round, and reactive to light.  Cardiovascular:     Rate and Rhythm: Normal rate and regular rhythm.     Heart sounds: No murmur heard.   Pulmonary:     Effort: Pulmonary effort is normal.     Breath sounds: No rales.  Abdominal:     Palpations: Abdomen is soft.     Tenderness: There is no abdominal tenderness.  Musculoskeletal:     Cervical back: Normal range of motion and neck supple.     Right lower leg: No edema.     Left lower leg: No edema.  Skin:    General: Skin is warm and dry.  Neurological:     General: No focal deficit present.     Mental Status: She is  alert.     Gait: Gait abnormal.     Comments: Oriented to person  Psychiatric:     Comments: Confused, pleasant, conversing     Labs reviewed: Recent Labs    05/11/20 0532 05/12/20 0525 05/13/20 0516 05/14/20 0440 05/29/20 0000 06/06/20 0000  NA 139 141 139 139 142 142  K 3.7 3.5 3.4* 4.0 3.2* 4.0  CL 103 103 102 104 103 105  CO2 '26 27 26 25 ' 30* 30*  GLUCOSE 88 87 113* 80  --   --   BUN '13 13 13 14 19 16  ' CREATININE 0.96 1.09* 1.11* 1.08* 1.5* 1.3*  CALCIUM 8.8* 8.7* 9.0 9.0 10.1 10.0  MG 2.2 2.2 2.1  --   --   --  PHOS 3.9 3.5 3.4  --   --   --    Recent Labs    05/08/20 0446 05/10/20 0511 05/13/20 0516 05/29/20 0000 06/06/20 0000  AST '30 21 18 17 16  ' ALT 64* 38 '27 12 13  ' ALKPHOS 152* 141* 112 76 68  BILITOT 0.6 0.6 0.5  --   --   PROT 5.7* 5.7* 6.5  --   --   ALBUMIN 2.4* 2.3* 2.7* 3.7 3.5   Recent Labs    03/28/20 0000 03/28/20 0000 05/02/20 2225 05/06/20 1937 05/08/20 0704 05/10/20 0511 05/12/20 0525 05/13/20 0516 05/29/20 0000  WBC 5.4   < > 7.3 6.9   < > 6.4 7.5 7.3 5.5  NEUTROABS 3,202.00  --  6.5 5.3  --   --   --   --   --   HGB 13.2  --  11.4* 11.3*   < > 10.5* 10.9* 11.9* 12.5  HCT 40  --  36.1 35.3*   < > 32.7* 33.9* 36.4 37  MCV  --    < > 93.0 93.1   < > 92.1 92.9 92.9  --   PLT 199  --  132* 117*   < > 228 242 266 206   < > = values in this interval not displayed.   Lab Results  Component Value Date   TSH 5.78 03/28/2020   No results found for: HGBA1C No results found for: CHOL, HDL, LDLCALC, LDLDIRECT, TRIG, CHOLHDL  Significant Diagnostic Results in last 30 days:  No results found.  Assessment/Plan  CKD (chronic kidney disease) stage 3, GFR 30-59 ml/min (HCC) CKD Bun/creat 16/1.3, eGFR 37 06/06/20   Depression with anxiety Anxiety/depression, takesprnSeroquel, Clonazepam qhs.   Vascular dementia ([place]) Dementia, resides in memory care unit [facility], takes Memantine, Donepezil, TSH 2.63 06/05/20, Elevated TSH2.63  06/05/20   Intraabdominal fluid collection 1-12/20/21 CT abd showed 10.7u cm fluid collectin around the dome of the liver, repeated CT abd showed resolution following underwent abscess aspiration 05/07/20 and removed drain 05/13/20, treated with 7 day course of Zosyn for liver abscess, ID no further tx or f/u needed.    Nondisplaced fracture of distal phalanx of right lesser toe(s), subsequent encounter for fracture with routine healing Hospitalized 12/12/211/12/22 Ortho R 2nd fx, stable, regular shoe, WBAT. 05/22/20 X-ray R foot showed closed nondisplaced fracture of proximal phalanx of lesser toes 3rd, 4th of right foot, 06/06/20 Ortho R 2nd fx, stable, regular shoe, WBAT.    Nasal bone fracture On set 05/22/20 closed fracture of nasal bone, f/u Dr. Fredric Dine DO otolaryngology.     Family/ staff Communication: plan of care reviewed with the patient and charge nurse.   Labs/tests ordered: none  Time spend 35 minutes.

## 2020-06-22 NOTE — Assessment & Plan Note (Signed)
CKD Bun/creat 16/1.3, eGFR 37 06/06/20

## 2020-06-22 NOTE — Assessment & Plan Note (Signed)
1-12/20/21 CT abd showed 10.7u cm fluid collectin around the dome of the liver, repeated CT abd showed resolution following underwent abscess aspiration 05/07/20 and removed drain 05/13/20, treated with 7 day course of Zosyn for liver abscess, ID no further tx or f/u needed.

## 2020-06-22 NOTE — Assessment & Plan Note (Addendum)
Hospitalized 12/12/211/12/22 Ortho R 2nd fx, stable, regular shoe, WBAT. 05/22/20 X-ray R foot showed closed nondisplaced fracture of proximal phalanx of lesser toes 3rd, 4th of right foot, 06/06/20 Ortho R 2nd fx, stable, regular shoe, WBAT.

## 2020-06-26 ENCOUNTER — Encounter: Payer: Self-pay | Admitting: Nurse Practitioner

## 2020-07-06 ENCOUNTER — Other Ambulatory Visit: Payer: Self-pay

## 2020-07-06 MED ORDER — CLONAZEPAM 0.5 MG PO TABS: 0.5000 mg | ORAL_TABLET | Freq: Every day | ORAL | 0 refills | Status: DC

## 2020-07-06 NOTE — Telephone Encounter (Signed)
In coming fax from [facility] for medication refill on "Klonopin 5 mg". Medication pend and sent to PCP [name], MD for approval.

## 2020-07-16 ENCOUNTER — Encounter: Payer: Self-pay | Admitting: Nurse Practitioner

## 2020-07-16 ENCOUNTER — Non-Acute Institutional Stay (SKILLED_NURSING_FACILITY): Payer: Medicare Other | Admitting: Nurse Practitioner

## 2020-07-16 DIAGNOSIS — N1831 Chronic kidney disease, stage 3a: Secondary | ICD-10-CM

## 2020-07-16 DIAGNOSIS — F418 Other specified anxiety disorders: Secondary | ICD-10-CM

## 2020-07-16 DIAGNOSIS — F01518 Vascular dementia, unspecified severity, with other behavioral disturbance: Secondary | ICD-10-CM

## 2020-07-16 DIAGNOSIS — F0151 Vascular dementia with behavioral disturbance: Secondary | ICD-10-CM | POA: Diagnosis not present

## 2020-07-16 NOTE — Assessment & Plan Note (Signed)
Occasional emotional outburst, exit seeking, unable to direct the patient, continue Clonazepam qhs, Seroquel 12.5mg  q12hr prn x 14 days.

## 2020-07-16 NOTE — Assessment & Plan Note (Signed)
resides in memory care unit [facility], takes Memantine, Donepezil, TSH 2.63 06/05/20

## 2020-07-16 NOTE — Assessment & Plan Note (Signed)
Bun/creat 16/1.3, eGFR 37 06/06/20

## 2020-07-16 NOTE — Progress Notes (Signed)
Location:    [place] Room Number: [place]:  SNF (31) Provider: Lennie Odor Linsay Vogt NP  [name], MD  Patient Care Team: [name], MD as PCP - General (Internal Medicine)  Extended Emergency Contact Information Primary Emergency Contact: Bertrum Sol [facility]) [facility] Address: [address]          [place], [place] [address] [name] of [place] Phone: [phone] Relation: Relative Secondary Emergency Contact: [name] Mobile Phone: [phone] Relation: Relative  Code Status:  DNR Goals of care: Advanced Directive information Advanced Directives 06/07/2020  Does Patient Have a Medical Advance Directive? Yes  Type of Paramedic of [place];Living will;Out of facility DNR (pink MOST or yellow form)  Does patient want to make changes to medical advance directive? No - Patient declined  Copy of [place] in Chart? Yes - validated most recent copy scanned in chart (See row information)  Pre-existing out of facility DNR order (yellow form or pink MOST form) -     Chief Complaint  Patient presents with  . Medical Management of Chronic Issues  . Health Maintenance    TDAP, Dexa scan, PCV13    HPI:  Pt is a 87 y.o. female seen today for medical management of chronic diseases.    Dementia, resides in memory care unit [facility], takes Memantine, Donepezil, TSH 2.63 06/05/20 Anxiety/depression, takesprnSeroquel, Clonazepam qhs.             CKD Bun/creat 16/1.3, eGFR 37 06/06/20   Past Medical History:  Diagnosis Date  . Arthritis   . Dementia ([place])   . Hx of concussion    [place]  . Hyperlipidemia   . Vitamin D deficiency, unspecified    Past Surgical History:  Procedure Laterality Date  . CHOLECYSTECTOMY      Allergies  Allergen Reactions  . Fish-Derived Products   . Pravachol [Pravastatin Sodium]     Allergies as of 07/16/2020       Reactions   Fish-derived Products    Pravachol [pravastatin Sodium]       Medication List       Accurate as of July 16, 2020 11:59 PM. If you have any questions, ask your nurse or doctor.        CALCIUM-VITAMIN D PO Take 1 tablet by mouth in the morning and at bedtime. 685m (1,5023m - 400 unit   clonazePAM 0.5 MG tablet Commonly known as: KLONOPIN Take 1 tablet (0.5 mg total) by mouth at bedtime.   donepezil 10 MG tablet Commonly known as: ARICEPT Take 5 mg by mouth at bedtime.   lactose free nutrition Liqd Take 237 mLs by mouth daily.   memantine 10 MG tablet Commonly known as: NAMENDA Take 10 mg by mouth 2 (two) times daily.   POLYVINYL ALCOHOL-POVIDONE OP Place 2 drops into both eyes 3 (three) times daily. drops; 0.5-0.6 %; amt: 2 drops each eye; ophthalmic (eye)       Review of Systems  Constitutional: Positive for unexpected weight change. Negative for fatigue and fever.       #3Ibs weight loss in the past month.   HENT: Positive for hearing loss. Negative for congestion and voice change.   Eyes: Negative for visual disturbance.       Dry eyes  Respiratory: Negative for cough.   Cardiovascular: Negative for leg swelling.  Gastrointestinal: Negative for abdominal pain and constipation.  Genitourinary: Negative for dysuria and urgency.  Musculoskeletal: Positive for arthralgias and gait problem.       Nose bone fx, R 3rd, 4th toe fxs.   Skin: Negative for color change.  Neurological: Negative for speech difficulty, weakness and headaches.       Dementia  Psychiatric/Behavioral: Positive for agitation, behavioral problems and confusion. Negative for sleep disturbance. The patient is nervous/anxious.     Immunization History  Administered Date(s) Administered  . Influenza-Unspecified 02/26/2019, 03/07/2020  . Moderna Sars-Covid-2 Vaccination 05/28/2019, 06/25/2019, 04/03/2020  . Pneumococcal-Unspecified 05/22/2016   Pertinent  Health Maintenance Due   Topic Date Due  . DEXA SCAN  Never done  . PNA vac Low Risk Adult (2 of 2 - PCV13) 05/22/2017  . INFLUENZA VACCINE  Completed   No flowsheet data found. Functional Status Survey:    Vitals:   07/16/20 1653  BP: (!) 142/80  Pulse: 84  Resp: 17  Temp: 98.5 F (36.9 C)  SpO2: 98%  Weight: 146 lb 6.4 oz (66.4 kg)  Height: '5\' 6"'  (1.676 m)   Body mass index is 23.63 kg/m. Physical Exam Vitals and nursing note reviewed.  Constitutional:      Appearance: Normal appearance.  HENT:     Head: Normocephalic and atraumatic.     Mouth/Throat:     Mouth: Mucous membranes are moist.  Eyes:     Extraocular Movements: Extraocular movements intact.     Conjunctiva/sclera: Conjunctivae normal.     Pupils: Pupils are equal, round, and reactive to light.  Cardiovascular:     Rate and Rhythm: Normal rate and regular rhythm.     Heart sounds: No murmur heard.   Pulmonary:     Effort: Pulmonary effort is normal.     Breath sounds: No rales.  Abdominal:     Palpations: Abdomen is soft.     Tenderness: There is no abdominal tenderness.  Musculoskeletal:     Cervical back: Normal range of motion and neck supple.     Right lower leg: No edema.     Left lower leg: No edema.  Skin:    General: Skin is warm and dry.  Neurological:     General: No focal deficit present.     Mental Status: She is alert.     Gait: Gait abnormal.     Comments: Oriented to person  Psychiatric:     Comments: Confused, pleasant, conversing     Labs reviewed: Recent Labs    05/11/20 0532 05/12/20 0525 05/13/20 0516 05/14/20 0440 05/29/20 0000 06/06/20 0000  NA 139 141 139 139 142 142  K 3.7 3.5 3.4* 4.0 3.2* 4.0  CL 103 103 102 104 103 105  CO2 '26 27 26 25 ' 30* 30*  GLUCOSE 88 87 113* 80  --   --   BUN '13 13 13 14 19 16  ' CREATININE 0.96 1.09* 1.11* 1.08* 1.5* 1.3*  CALCIUM 8.8* 8.7* 9.0 9.0 10.1 10.0  MG 2.2 2.2 2.1  --   --   --   PHOS 3.9 3.5 3.4  --   --   --    Recent Labs     05/08/20 0446 05/10/20 0511 05/13/20 0516 05/29/20 0000 06/06/20 0000  AST '30 21 18 17 16  ' ALT 64* 38 '27 12 13  ' ALKPHOS 152* 141* 112 76 68  BILITOT 0.6 0.6 0.5  --   --   PROT 5.7* 5.7* 6.5  --   --   ALBUMIN 2.4* 2.3* 2.7* 3.7 3.5   Recent Labs  03/28/20 0000 03/28/20 0000 05/02/20 2225 05/06/20 1937 05/08/20 0704 05/10/20 0511 05/12/20 0525 05/13/20 0516 05/29/20 0000  WBC 5.4   < > 7.3 6.9   < > 6.4 7.5 7.3 5.5  NEUTROABS 3,202.00  --  6.5 5.3  --   --   --   --   --   HGB 13.2  --  11.4* 11.3*   < > 10.5* 10.9* 11.9* 12.5  HCT 40  --  36.1 35.3*   < > 32.7* 33.9* 36.4 37  MCV  --    < > 93.0 93.1   < > 92.1 92.9 92.9  --   PLT 199  --  132* 117*   < > 228 242 266 206   < > = values in this interval not displayed.   Lab Results  Component Value Date   TSH 5.78 03/28/2020   No results found for: HGBA1C No results found for: CHOL, HDL, LDLCALC, LDLDIRECT, TRIG, CHOLHDL  Significant Diagnostic Results in last 30 days:  No results found.  Assessment/Plan  Depression with anxiety Occasional emotional outburst, exit seeking, unable to direct the patient, continue Clonazepam qhs, Seroquel 12.42m q12hr prn x 14 days.   Vascular dementia ([place]  [facility] takes Memantine, Donepezil, TSH 2.63 06/05/20   CKD (chronic kidney disease) stage 3, GFR 30-59 ml/min (HCC) Bun/creat 16/1.3, eGFR 37 06/06/20    Family/ staff Communication: plan of care reviewed with the patient and charge nurse.   Labs/tests ordered:  none  Time spend 35 minutes.

## 2020-07-17 ENCOUNTER — Encounter: Payer: Self-pay | Admitting: Nurse Practitioner

## 2020-07-27 ENCOUNTER — Encounter: Payer: Self-pay | Admitting: Internal Medicine

## 2020-07-27 ENCOUNTER — Non-Acute Institutional Stay (SKILLED_NURSING_FACILITY): Payer: Medicare Other | Admitting: Internal Medicine

## 2020-07-27 DIAGNOSIS — F418 Other specified anxiety disorders: Secondary | ICD-10-CM

## 2020-07-27 DIAGNOSIS — F0281 Dementia in other diseases classified elsewhere with behavioral disturbance: Secondary | ICD-10-CM | POA: Diagnosis not present

## 2020-07-27 DIAGNOSIS — G301 Alzheimer's disease with late onset: Secondary | ICD-10-CM

## 2020-07-27 NOTE — Progress Notes (Signed)
Location:   [facility] Room Number: 103 Place of Service:  SNF [phone]) Provider:  Einar Crow MD  [name], MD  Patient Care Team: [name], MD as PCP - General (Internal Medicine)  Extended Emergency Contact Information Primary Emergency Contact: Ramiro Harvest, [facility]) [facility] Address: [address]          [place], [place] [address] [name] of [place] Phone: [phone] Relation: Relative Secondary Emergency Contact: Marlowe Kays Mobile Phone: [phone] Relation: Relative  Code Status:  DNR Goals of care: Advanced Directive information Advanced Directives 06/07/2020  Does Patient Have a Medical Advance Directive? Yes  Type of Estate agent of [place];Living will;Out of facility DNR (pink MOST or yellow form)  Does patient want to make changes to medical advance directive? No - Patient declined  Copy of Healthcare Power of Attorney in Chart? Yes - validated most recent copy scanned in chart (See row information)  Pre-existing out of facility DNR order (yellow form or pink MOST form) -     Chief Complaint  Patient presents with  . Acute Visit    Behaviors    HPI:  Pt is a 87 y.o. female seen today for an acute visit for Behaviors  Admitted to the hospital from 12/12-12/20 for Intraabdominal Abscess/ Liver abscess R was consulted. Underwent Liver Abscess Aspiration on 05/07/20 Drain was placed. Abscess Grew Pseudomonas Was treated with Zosyn for 7 days.Infectious disease no further antibiotics was necessary repeat CT scan done after the removal of drain showed resolution of abscess.  Has h/o Dementia CT scan Moderate severity cerebral atrophy and microvascular disease  Continues to have behavior issues Trying to leave the Memory unit She wants to catch the plane to go to [place]. Packing her stuff Get upset with Nurses No Fever or Dysuria  Past Medical History:  Diagnosis Date  . Arthritis   .  Dementia (HCC)   . Hx of concussion    [employer] [facility] [place]  . Hyperlipidemia   . Vitamin D deficiency, unspecified    Past Surgical History:  Procedure Laterality Date  . CHOLECYSTECTOMY      Allergies  Allergen Reactions  . Fish-Derived Products   . Pravachol [Pravastatin Sodium]     Allergies as of 07/27/2020      Reactions   Fish-derived Products    Pravachol [pravastatin Sodium]       Medication List       Accurate as of July 27, 2020 10:48 AM. If you have any questions, ask your nurse or doctor.        CALCIUM-VITAMIN D PO Take 1 tablet by mouth in the morning and at bedtime. 600mg  (1,500mg ) - 400 unit   clonazePAM 0.5 MG tablet Commonly known as: KLONOPIN Take 1 tablet (0.5 mg total) by mouth at bedtime.   donepezil 10 MG tablet Commonly known as: ARICEPT Take 5 mg by mouth at bedtime.   lactose free nutrition Liqd Take 237 mLs by mouth daily.   memantine 10 MG tablet Commonly known as: NAMENDA Take 10 mg by mouth 2 (two) times daily.   POLYVINYL ALCOHOL-POVIDONE OP Place 2 drops into both eyes 3 (three) times daily. drops; 0.5-0.6 %; amt: 2 drops each eye; ophthalmic (eye)   QUEtiapine 25 MG tablet Commonly known as: SEROQUEL Take 12.5 mg by mouth every 12 (twelve) hours as needed.       Review of Systems  Unable to perform ROS: Dementia    Immunization  History  Administered Date(s) Administered  . Influenza-Unspecified 02/26/2019, 03/07/2020  . Moderna Sars-Covid-2 Vaccination 05/28/2019, 06/25/2019, 04/03/2020  . Pneumococcal-Unspecified 05/22/2016   Pertinent  Health Maintenance Due  Topic Date Due  . DEXA SCAN  Never done  . PNA vac Low Risk Adult (2 of 2 - PCV13) 05/22/2017  . INFLUENZA VACCINE  Completed   No flowsheet data found. Functional Status Survey:    Vitals:   07/27/20 1046  BP: 128/68  Pulse: 79  Resp: 18  Temp: (!) 97.5 F (36.4 C)  SpO2: 93%  Weight: 145 lb 6.4 oz (66 kg)  Height: 5\' 6"  (1.676  m)   Body mass index is 23.47 kg/m. Physical Exam Vitals reviewed.  Constitutional:      Appearance: Normal appearance.  HENT:     Head: Normocephalic.     Nose: Nose normal.     Mouth/Throat:     Mouth: Mucous membranes are moist.     Pharynx: Oropharynx is clear.  Eyes:     Pupils: Pupils are equal, round, and reactive to light.  Cardiovascular:     Rate and Rhythm: Normal rate and regular rhythm.     Pulses: Normal pulses.     Heart sounds: Normal heart sounds.  Pulmonary:     Effort: Pulmonary effort is normal.     Breath sounds: Normal breath sounds.  Abdominal:     General: Abdomen is flat. Bowel sounds are normal.     Palpations: Abdomen is soft.  Musculoskeletal:        General: No swelling.     Cervical back: Neck supple.  Skin:    General: Skin is warm.  Neurological:     General: No focal deficit present.     Mental Status: She is alert.  Psychiatric:        Mood and Affect: Mood normal.        Thought Content: Thought content normal.     Labs reviewed: Recent Labs    05/11/20 0532 05/12/20 0525 05/13/20 0516 05/14/20 0440 05/29/20 0000 06/06/20 0000  NA 139 141 139 139 142 142  K 3.7 3.5 3.4* 4.0 3.2* 4.0  CL 103 103 102 104 103 105  CO2 26 27 26 25  30* 30*  GLUCOSE 88 87 113* 80  --   --   BUN 13 13 13 14 19 16   CREATININE 0.96 1.09* 1.11* 1.08* 1.5* 1.3*  CALCIUM 8.8* 8.7* 9.0 9.0 10.1 10.0  MG 2.2 2.2 2.1  --   --   --   PHOS 3.9 3.5 3.4  --   --   --    Recent Labs    05/08/20 0446 05/10/20 0511 05/13/20 0516 05/29/20 0000 06/06/20 0000  AST 30 21 18 17 16   ALT 64* 38 27 12 13   ALKPHOS 152* 141* 112 76 68  BILITOT 0.6 0.6 0.5  --   --   PROT 5.7* 5.7* 6.5  --   --   ALBUMIN 2.4* 2.3* 2.7* 3.7 3.5   Recent Labs    03/28/20 0000 03/28/20 0000 05/02/20 2225 05/06/20 1937 05/08/20 0704 05/10/20 0511 05/12/20 0525 05/13/20 0516 05/29/20 0000  WBC 5.4   < > 7.3 6.9   < > 6.4 7.5 7.3 5.5  NEUTROABS 3,202.00  --  6.5 5.3   --   --   --   --   --   HGB 13.2  --  11.4* 11.3*   < > 10.5* 10.9* 11.9* 12.5  HCT 40  --  36.1 35.3*   < > 32.7* 33.9* 36.4 37  MCV  --    < > 93.0 93.1   < > 92.1 92.9 92.9  --   PLT 199  --  132* 117*   < > 228 242 266 206   < > = values in this interval not displayed.   Lab Results  Component Value Date   TSH 5.78 03/28/2020   No results found for: HGBA1C No results found for: CHOL, HDL, LDLCALC, LDLDIRECT, TRIG, CHOLHDL  Significant Diagnostic Results in last 30 days:  No results found.  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

## 2020-08-02 LAB — BASIC METABOLIC PANEL
BUN: 13 (ref 4–21)
CO2: 30 — AB (ref 13–22)
Chloride: 104 (ref 99–108)
Creatinine: 1 (ref 0.5–1.1)
Glucose: 79
Potassium: 4.8 (ref 3.4–5.3)
Sodium: 141 (ref 137–147)

## 2020-08-02 LAB — COMPREHENSIVE METABOLIC PANEL: Calcium: 9.3 (ref 8.7–10.7)

## 2020-08-13 ENCOUNTER — Other Ambulatory Visit: Payer: Self-pay | Admitting: *Deleted

## 2020-08-13 MED ORDER — CLONAZEPAM 0.5 MG PO TABS: 0.5000 mg | ORAL_TABLET | Freq: Every day | ORAL | 0 refills | Status: DC

## 2020-08-13 NOTE — Telephone Encounter (Signed)
Pended Rx and sent to [facility] for approval (Dr. Gupta out of office) 

## 2020-08-13 NOTE — Telephone Encounter (Signed)
Received fax from [name] Pended Rx and sent to Dr. Gupta for approval.  

## 2020-08-17 ENCOUNTER — Encounter: Payer: Self-pay | Admitting: Internal Medicine

## 2020-08-17 ENCOUNTER — Non-Acute Institutional Stay (SKILLED_NURSING_FACILITY): Payer: Medicare Other | Admitting: Internal Medicine

## 2020-08-17 DIAGNOSIS — F418 Other specified anxiety disorders: Secondary | ICD-10-CM | POA: Diagnosis not present

## 2020-08-17 DIAGNOSIS — G301 Alzheimer's disease with late onset: Secondary | ICD-10-CM

## 2020-08-17 DIAGNOSIS — F0281 Dementia in other diseases classified elsewhere with behavioral disturbance: Secondary | ICD-10-CM

## 2020-08-17 NOTE — Progress Notes (Signed)
Location:   [facility] Room Number: 103 Place of Service:  SNF [phone]) Provider: Einar Crow MD   Sarah Gammon, MD  Patient Care Team: Sarah Gammon, MD as PCP - General (Internal Medicine)  Extended Emergency Contact Information Primary Emergency Contact: Ramiro Harvest, [facility]) [facility] Address: [address]          [place], [place] [address] [name] of [place] Phone: [phone] Relation: Relative Secondary Emergency Contact: Marlowe Kays Mobile Phone: [phone] Relation: Relative  Code Status:  DNR Goals of care: Advanced Directive information Advanced Directives 06/07/2020  Does Patient Have a Medical Advance Directive? Yes  Type of Estate agent of [place];Living will;Out of facility DNR (pink MOST or yellow form)  Does patient want to make changes to medical advance directive? No - Patient declined  Copy of Healthcare Power of Attorney in Chart? Yes - validated most recent copy scanned in chart (See row information)  Pre-existing out of facility DNR order (yellow form or pink MOST form) -     Chief Complaint  Patient presents with  . Acute Visit     Behaviors    HPI:  Pt is a 87 y.o. female seen today for an acute visit for Behaviors  Patient has Dementia and Lives in Memory Unit  Also was Admitted to the hospital from 12/12-12/20 for Intraabdominal Abscess/ Liver abscess IR was consulted. Underwent Liver Abscess Aspiration on 05/07/20 Drain was placed. Abscess Grew Pseudomonas Was treated with Zosyn for 7 days.Infectious disease no further antibiotics was necessary repeat CT scan done after the removal of drain showed resolution of abscess.  Has h/o Dementia CT scan Moderate severity cerebral atrophy and microvascular disease  Patient continues to have behavior issues including drinking the memory unit trying to catch a plane to go to [place].  Hitting the staff getting very agitated. She was sitting in  her room.  Was very pleasant to me.  She was telling me how she is going to stay here in [place] did she gets better.  And then planning to go back to [place].  Her stuff was all packed near her door She denies any cough chest pain shortness of breath fever or dysuria  Patient never married and has no children.  Her POA is her niece/cousin     Past Medical History:  Diagnosis Date  . Arthritis   . Dementia (HCC)   . Hx of concussion    [facility] [place]  . Hyperlipidemia   . Vitamin D deficiency, unspecified    Past Surgical History:  Procedure Laterality Date  . CHOLECYSTECTOMY      Allergies  Allergen Reactions  . Fish-Derived Products   . Pravachol [Pravastatin Sodium]     Allergies as of 08/17/2020      Reactions   Fish-derived Products    Pravachol [pravastatin Sodium]       Medication List       Accurate as of August 17, 2020  3:08 PM. If you have any questions, ask your nurse or doctor.        CALCIUM-VITAMIN D PO Take 1 tablet by mouth in the morning and at bedtime. 600mg  (1,500mg ) - 400 unit   clonazePAM 0.5 MG tablet Commonly known as: KLONOPIN Take 1 tablet (0.5 mg total) by mouth at bedtime.   donepezil 10 MG tablet Commonly known as: ARICEPT Take 5 mg by mouth at bedtime.   escitalopram 5 MG tablet Commonly known as: LEXAPRO Take  5 mg by mouth daily.   lactose free nutrition Liqd Take 237 mLs by mouth daily.   LORazepam 0.5 MG tablet Commonly known as: ATIVAN Take 0.5 mg by mouth every 6 (six) hours as needed for anxiety.   memantine 10 MG tablet Commonly known as: NAMENDA Take 10 mg by mouth 2 (two) times daily.   POLYVINYL ALCOHOL-POVIDONE OP Place 2 drops into both eyes 3 (three) times daily. drops; 0.5-0.6 %; amt: 2 drops each eye; ophthalmic (eye)   QUEtiapine 25 MG tablet Commonly known as: SEROQUEL Take 12.5 mg by mouth 2 (two) times daily.       Review of Systems  Unable to perform ROS: Dementia     Immunization History  Administered Date(s) Administered  . Influenza-Unspecified 02/26/2019, 03/07/2020  . Moderna Sars-Covid-2 Vaccination 05/28/2019, 06/25/2019, 04/03/2020  . Pneumococcal-Unspecified 05/22/2016   Pertinent  Health Maintenance Due  Topic Date Due  . DEXA SCAN  Never done  . PNA vac Low Risk Adult (2 of 2 - PCV13) 05/22/2017  . INFLUENZA VACCINE  Completed   No flowsheet data found. Functional Status Survey:    Vitals:   08/17/20 1500  BP: 114/65  Pulse: 94  Resp: 18  Temp: 98 F (36.7 C)  SpO2: 96%  Weight: 145 lb 6.4 oz (66 kg)  Height: 5\' 6"  (1.676 m)   Body mass index is 23.47 kg/m. Physical Exam  Constitutional:  Well-developed and well-nourished.  HENT:  Head: Normocephalic.  Mouth/Throat: Oropharynx is clear and moist.  Eyes: Pupils are equal, round, and reactive to light.  Neck: Neck supple.  Cardiovascular: Normal rate and normal heart sounds.  No murmur heard. Pulmonary/Chest: Effort normal and breath sounds normal. No respiratory distress. No wheezes. She has no rales.  Abdominal: Soft. Bowel sounds are normal. No distension. There is no tenderness. There is no rebound.  Musculoskeletal: No edema.  Lymphadenopathy: none Neurological: No Focal Deficits Oriented to herself Knows that she is in Nursing Home Skin: Skin is warm and dry.  Psychiatric: Normal mood and affect. Behavior is normal. Thought content normal.    Labs reviewed: Recent Labs    05/11/20 0532 05/12/20 0525 05/13/20 0516 05/14/20 0440 05/29/20 0000 06/06/20 0000 08/02/20 0000  NA 139 141 139 139 142 142 141  K 3.7 3.5 3.4* 4.0 3.2* 4.0 4.8  CL 103 103 102 104 103 105 104  CO2 26 27 26 25  30* 30* 30*  GLUCOSE 88 87 113* 80  --   --   --   BUN 13 13 13 14 19 16 13   CREATININE 0.96 1.09* 1.11* 1.08* 1.5* 1.3* 1.0  CALCIUM 8.8* 8.7* 9.0 9.0 10.1 10.0 9.3  MG 2.2 2.2 2.1  --   --   --   --   PHOS 3.9 3.5 3.4  --   --   --   --    Recent Labs     05/08/20 0446 05/10/20 0511 05/13/20 0516 05/29/20 0000 06/06/20 0000  AST 30 21 18 17 16   ALT 64* 38 27 12 13   ALKPHOS 152* 141* 112 76 68  BILITOT 0.6 0.6 0.5  --   --   PROT 5.7* 5.7* 6.5  --   --   ALBUMIN 2.4* 2.3* 2.7* 3.7 3.5   Recent Labs    03/28/20 0000 03/28/20 0000 05/02/20 2225 05/06/20 1937 05/08/20 0704 05/10/20 0511 05/12/20 0525 05/13/20 0516 05/29/20 0000  WBC 5.4   < > 7.3 6.9   < > 6.4  7.5 7.3 5.5  NEUTROABS 3,202.00  --  6.5 5.3  --   --   --   --   --   HGB 13.2  --  11.4* 11.3*   < > 10.5* 10.9* 11.9* 12.5  HCT 40  --  36.1 35.3*   < > 32.7* 33.9* 36.4 37  MCV  --    < > 93.0 93.1   < > 92.1 92.9 92.9  --   PLT 199  --  132* 117*   < > 228 242 266 206   < > = values in this interval not displayed.   Lab Results  Component Value Date   TSH 5.78 03/28/2020   No results found for: HGBA1C No results found for: CHOL, HDL, LDLCALC, LDLDIRECT, TRIG, CHOLHDL  Significant Diagnostic Results in last 30 days:  No results found.  Assessment/Plan  Late onset Alzheimer's dementia with behavioral disturbance (HCC) Change Seroquel to 25 mg BID On Namenda and Aricept Also have to usee Ativan PRN when she gets agitated Will consider Depakote again. It was discontinued when patient had intra-abdominal abscess  Depression with anxiety Change Lexapro to 10 mg    Family/ staff Communication:   Labs/tests ordered:

## 2020-08-17 NOTE — Progress Notes (Signed)
This encounter was created in error - please disregard.  This encounter was created in error - please disregard.

## 2020-08-31 ENCOUNTER — Non-Acute Institutional Stay (SKILLED_NURSING_FACILITY): Payer: Medicare Other | Admitting: Nurse Practitioner

## 2020-08-31 ENCOUNTER — Encounter: Payer: Self-pay | Admitting: Nurse Practitioner

## 2020-08-31 DIAGNOSIS — N1831 Chronic kidney disease, stage 3a: Secondary | ICD-10-CM | POA: Diagnosis not present

## 2020-08-31 DIAGNOSIS — F0151 Vascular dementia with behavioral disturbance: Secondary | ICD-10-CM | POA: Diagnosis not present

## 2020-08-31 DIAGNOSIS — F418 Other specified anxiety disorders: Secondary | ICD-10-CM

## 2020-08-31 DIAGNOSIS — F01518 Vascular dementia, unspecified severity, with other behavioral disturbance: Secondary | ICD-10-CM

## 2020-08-31 NOTE — Progress Notes (Signed)
Location:   [facility] Room Number: 103 Place of Service:  SNF (31) Provider:  Jerrie Gullo X, NP  [name], MD  Patient Care Team: [name], MD as PCP - General (Internal Medicine)  Extended Emergency Contact Information Primary Emergency Contact: Ramiro Harvest, [facility]) [facility] Address: [address]          [place], [place] [address] [name] of [place] Phone: [phone] Relation: Relative Secondary Emergency Contact: Marlowe Kays Mobile Phone: [phone] Relation: Relative  Code Status:  DNR Goals of care: Advanced Directive information Advanced Directives 08/31/2020  Does Patient Have a Medical Advance Directive? Yes  Type of Estate agent of [place];Living will  Does patient want to make changes to medical advance directive? No - Patient declined  Copy of Healthcare Power of Attorney in Chart? Yes - validated most recent copy scanned in chart (See row information)  Pre-existing out of facility DNR order (yellow form or pink MOST form) -     Chief Complaint  Patient presents with  . Medical Management of Chronic Issues    Routine follow up visit. Discuss the need for Tetanus/Tdap, pneumonia vaccine and dexa scan    HPI:  Pt is a 87 y.o. female seen today for medical management of chronic diseases.      Dementia, resides in memory care unit [facility], takes Memantine, Donepezil, TSH 2.63 06/05/20 Anxiety/depression, takesSeroquel 25mg  bid since 08/17/20, takes  Clonazepam qhs, prn Lorazepam,  Lexapro 10mg  since 08/17/20 CKD Bun/creat 13/1.0 08/02/20   Past Medical History:  Diagnosis Date  . Arthritis   . Dementia (HCC)   . Hx of concussion    08/19/20 10/02/20  . Hyperlipidemia   . Vitamin D deficiency, unspecified    Past Surgical History:  Procedure Laterality Date  . CHOLECYSTECTOMY      Allergies  Allergen Reactions  . Fish-Derived Products   . Pravachol  [Pravastatin Sodium]     Allergies as of 08/31/2020      Reactions   Fish-derived Products    Pravachol [pravastatin Sodium]       Medication List       Accurate as of August 31, 2020 11:59 PM. If you have any questions, ask your nurse or doctor.        CALCIUM-VITAMIN D PO Take 1 tablet by mouth in the morning and at bedtime. 600mg  (1,500mg ) - 400 unit   clonazePAM 0.5 MG tablet Commonly known as: KLONOPIN Take 1 tablet (0.5 mg total) by mouth at bedtime.   donepezil 10 MG tablet Commonly known as: ARICEPT Take 5 mg by mouth at bedtime.   escitalopram 5 MG tablet Commonly known as: LEXAPRO Take 10 mg by mouth daily.   lactose free nutrition Liqd Take 237 mLs by mouth daily.   LORazepam 0.5 MG tablet Commonly known as: ATIVAN Take 0.5 mg by mouth every 6 (six) hours as needed for anxiety.   memantine 10 MG tablet Commonly known as: NAMENDA Take 10 mg by mouth 2 (two) times daily.   POLYVINYL ALCOHOL-POVIDONE OP Place 2 drops into both eyes 3 (three) times daily. drops; 0.5-0.6 %; amt: 2 drops each eye; ophthalmic (eye)   QUEtiapine 25 MG tablet Commonly known as: SEROQUEL Take 25 mg by mouth 2 (two) times daily.       Review of Systems  Constitutional: Negative for fatigue, fever and unexpected weight change.  HENT: Positive for hearing loss. Negative for congestion and voice change.  Eyes: Negative for visual disturbance.       Dry eyes  Respiratory: Negative for cough.   Cardiovascular: Negative for leg swelling.  Gastrointestinal: Negative for abdominal pain and constipation.  Genitourinary: Negative for dysuria and urgency.  Musculoskeletal: Positive for arthralgias and gait problem.       Gait has improved, rails or furniture walking sometimes.   Skin: Negative for color change.  Neurological: Negative for speech difficulty, weakness and headaches.       Dementia  Psychiatric/Behavioral: Positive for behavioral problems and confusion. Negative for  sleep disturbance. The patient is nervous/anxious.     Immunization History  Administered Date(s) Administered  . Influenza-Unspecified 02/26/2019, 03/07/2020  . Moderna Sars-Covid-2 Vaccination 05/28/2019, 06/25/2019, 04/03/2020  . Pneumococcal-Unspecified 05/22/2016   Pertinent  Health Maintenance Due  Topic Date Due  . DEXA SCAN  Never done  . PNA vac Low Risk Adult (2 of 2 - PCV13) 05/22/2017  . INFLUENZA VACCINE  12/24/2020   No flowsheet data found. Functional Status Survey:    Vitals:   08/31/20 1329  BP: 118/62  Pulse: 87  Resp: 20  Temp: 97.7 F (36.5 C)  SpO2: 97%  Weight: 145 lb 12.8 oz (66.1 kg)  Height: 5\' 6"  (1.676 m)   Body mass index is 23.53 kg/m. Physical Exam Vitals and nursing note reviewed.  Constitutional:      Appearance: Normal appearance.  HENT:     Head: Normocephalic and atraumatic.     Mouth/Throat:     Mouth: Mucous membranes are moist.  Eyes:     Extraocular Movements: Extraocular movements intact.     Conjunctiva/sclera: Conjunctivae normal.     Pupils: Pupils are equal, round, and reactive to light.  Cardiovascular:     Rate and Rhythm: Normal rate and regular rhythm.     Heart sounds: No murmur heard.   Pulmonary:     Effort: Pulmonary effort is normal.     Breath sounds: No rales.  Abdominal:     Palpations: Abdomen is soft.     Tenderness: There is no abdominal tenderness.  Musculoskeletal:     Cervical back: Normal range of motion and neck supple.     Right lower leg: No edema.     Left lower leg: No edema.  Skin:    General: Skin is warm and dry.  Neurological:     General: No focal deficit present.     Mental Status: She is alert.     Gait: Gait abnormal.     Comments: Oriented to person  Psychiatric:     Comments: Confused, pleasant, conversing     Labs reviewed: Recent Labs    05/11/20 0532 05/12/20 0525 05/13/20 0516 05/14/20 0440 05/29/20 0000 06/06/20 0000 08/02/20 0000  NA 139 141 139 139 142  142 141  K 3.7 3.5 3.4* 4.0 3.2* 4.0 4.8  CL 103 103 102 104 103 105 104  CO2 26 27 26 25  30* 30* 30*  GLUCOSE 88 87 113* 80  --   --   --   BUN 13 13 13 14 19 16 13   CREATININE 0.96 1.09* 1.11* 1.08* 1.5* 1.3* 1.0  CALCIUM 8.8* 8.7* 9.0 9.0 10.1 10.0 9.3  MG 2.2 2.2 2.1  --   --   --   --   PHOS 3.9 3.5 3.4  --   --   --   --    Recent Labs    05/08/20 0446 05/10/20 0511 05/13/20 0516 05/29/20 0000  06/06/20 0000  AST 30 21 18 17 16   ALT 64* 38 27 12 13   ALKPHOS 152* 141* 112 76 68  BILITOT 0.6 0.6 0.5  --   --   PROT 5.7* 5.7* 6.5  --   --   ALBUMIN 2.4* 2.3* 2.7* 3.7 3.5   Recent Labs    03/28/20 0000 03/28/20 0000 05/02/20 2225 05/06/20 1937 05/08/20 0704 05/10/20 0511 05/12/20 0525 05/13/20 0516 05/29/20 0000  WBC 5.4   < > 7.3 6.9   < > 6.4 7.5 7.3 5.5  NEUTROABS 3,202.00  --  6.5 5.3  --   --   --   --   --   HGB 13.2  --  11.4* 11.3*   < > 10.5* 10.9* 11.9* 12.5  HCT 40  --  36.1 35.3*   < > 32.7* 33.9* 36.4 37  MCV  --    < > 93.0 93.1   < > 92.1 92.9 92.9  --   PLT 199  --  132* 117*   < > 228 242 266 206   < > = values in this interval not displayed.   Lab Results  Component Value Date   TSH 5.78 03/28/2020   No results found for: HGBA1C No results found for: CHOL, HDL, LDLCALC, LDLDIRECT, TRIG, CHOLHDL  Significant Diagnostic Results in last 30 days:  No results found.  Assessment/Plan Depression with anxiety Anxiety/depression, takesSeroquel 25mg  bid since 08/17/20, takes  Clonazepam qhs, prn Lorazepam,  Lexapro 10mg  since 08/17/20  Vascular dementia (HCC) Dementia, resides in memory care unit [facility], takes Memantine, Donepezil, TSH 2.63 06/05/20   CKD (chronic kidney disease) stage 3, GFR 30-59 ml/min (HCC) CKD Bun/creat 13/1.0 08/02/20      Family/ staff Communication: plan of care reviewed with the patient and charge nurse.   Labs/tests ordered:  none  Time spend 35 minutes.

## 2020-09-03 ENCOUNTER — Encounter: Payer: Self-pay | Admitting: Nurse Practitioner

## 2020-09-03 NOTE — Assessment & Plan Note (Signed)
Anxiety/depression, takesSeroquel 25mg  bid since 08/17/20, takes  Clonazepam qhs, prn Lorazepam,  Lexapro 10mg  since 08/17/20

## 2020-09-03 NOTE — Assessment & Plan Note (Signed)
Dementia, resides in memory care unit [facility], takes Memantine, Donepezil, TSH 2.63 06/05/20

## 2020-09-03 NOTE — Assessment & Plan Note (Signed)
CKD Bun/creat 13/1.0 08/02/20

## 2020-09-04 ENCOUNTER — Encounter: Payer: Self-pay | Admitting: Internal Medicine

## 2020-09-04 NOTE — Progress Notes (Signed)
A user error has taken place.

## 2020-09-10 ENCOUNTER — Non-Acute Institutional Stay (SKILLED_NURSING_FACILITY): Payer: Medicare Other | Admitting: Nurse Practitioner

## 2020-09-10 ENCOUNTER — Encounter: Payer: Self-pay | Admitting: Nurse Practitioner

## 2020-09-10 DIAGNOSIS — F0151 Vascular dementia with behavioral disturbance: Secondary | ICD-10-CM

## 2020-09-10 DIAGNOSIS — N1831 Chronic kidney disease, stage 3a: Secondary | ICD-10-CM | POA: Diagnosis not present

## 2020-09-10 DIAGNOSIS — F01518 Vascular dementia, unspecified severity, with other behavioral disturbance: Secondary | ICD-10-CM

## 2020-09-10 DIAGNOSIS — F418 Other specified anxiety disorders: Secondary | ICD-10-CM | POA: Diagnosis not present

## 2020-09-10 DIAGNOSIS — W19XXXA Unspecified fall, initial encounter: Secondary | ICD-10-CM | POA: Diagnosis not present

## 2020-09-10 NOTE — Assessment & Plan Note (Signed)
resides in memory care unit [facility], takes Memantine, Donepezil, TSH 2.63 06/05/20

## 2020-09-10 NOTE — Assessment & Plan Note (Addendum)
CKD Bun/creat 13/1.0 08/02/20  

## 2020-09-10 NOTE — Progress Notes (Signed)
Location:   [facility] Room Number: 103 Place of Service:  SNF (31) Provider: [facility] Oshua Mcconaha NP  [name], MD  Patient Care Team: [name], MD as PCP - General (Internal Medicine)  Extended Emergency Contact Information Primary Emergency Contact: Ramiro Harvest, [facility]) [facility] Address: [address]          [place], [place] [address] [name] of [place] Phone: [phone] Relation: Relative Secondary Emergency Contact: Marlowe Kays Mobile Phone: [phone] Relation: Relative  Code Status: DNR Goals of care: Advanced Directive information Advanced Directives 08/31/2020  Does Patient Have a Medical Advance Directive? Yes  Type of Estate agent of [place];Living will  Does patient want to make changes to medical advance directive? No - Patient declined  Copy of Healthcare Power of Attorney in Chart? Yes - validated most recent copy scanned in chart (See row information)  Pre-existing out of facility DNR order (yellow form or pink MOST form) -     Chief Complaint  Patient presents with  . Acute Visit    falls    HPI:  Pt is a 87 y.o. female seen today for an acute visit for near fall 09/07/20, fall when the patient was found sitting on the floor in her bathroom, no apparent injury noted, the patient ambulated with walker during my examination today, she is in her usual state of health.     Dementia, resides in memory care unit [facility], takes Memantine, Donepezil, TSH 2.63 06/05/20 Anxiety/depression, takesSeroquel 25mg  bid since 08/17/20, takes  Clonazepam qhs, prn Lorazepam,  Lexapro 10mg  since 08/17/20 CKD Bun/creat 13/1.0 08/02/20   Past Medical History:  Diagnosis Date  . Arthritis   . Dementia (HCC)   . Hx of concussion    08/19/20 10/02/20  . Hyperlipidemia   . Vitamin D deficiency, unspecified    Past Surgical History:  Procedure Laterality Date  . CHOLECYSTECTOMY      Allergies   Allergen Reactions  . Fish-Derived Products   . Pravachol [Pravastatin Sodium]     Allergies as of 09/10/2020      Reactions   Fish-derived Products    Pravachol [pravastatin Sodium]       Medication List       Accurate as of September 10, 2020 11:59 PM. If you have any questions, ask your nurse or doctor.        CALCIUM-VITAMIN D PO Take 1 tablet by mouth in the morning and at bedtime. 600mg  (1,500mg ) - 400 unit   clonazePAM 0.5 MG tablet Commonly known as: KLONOPIN Take 1 tablet (0.5 mg total) by mouth at bedtime.   donepezil 10 MG tablet Commonly known as: ARICEPT Take 5 mg by mouth at bedtime.   escitalopram 5 MG tablet Commonly known as: LEXAPRO Take 10 mg by mouth daily.   lactose free nutrition Liqd Take 237 mLs by mouth daily.   LORazepam 0.5 MG tablet Commonly known as: ATIVAN Take 0.5 mg by mouth every 6 (six) hours as needed for anxiety.   memantine 10 MG tablet Commonly known as: NAMENDA Take 10 mg by mouth 2 (two) times daily.   POLYVINYL ALCOHOL-POVIDONE OP Place 2 drops into both eyes 3 (three) times daily. drops; 0.5-0.6 %; amt: 2 drops each eye; ophthalmic (eye)   QUEtiapine 25 MG tablet Commonly known as: SEROQUEL Take 25 mg by mouth 2 (two) times daily.      ROS was provided with assistance of staff.  Review of Systems  Constitutional: Negative for activity change, appetite change and fever.  HENT: Positive for hearing loss. Negative for congestion and voice change.   Eyes: Negative for visual disturbance.       Dry eyes  Respiratory: Negative for cough.   Cardiovascular: Negative for leg swelling.  Gastrointestinal: Negative for abdominal pain and constipation.  Genitourinary: Negative for dysuria and urgency.  Musculoskeletal: Positive for arthralgias and gait problem.       Gait has improved, rails or furniture walking sometimes.   Skin: Negative for color change.  Neurological: Negative for speech difficulty, weakness and  light-headedness.       Dementia  Psychiatric/Behavioral: Positive for behavioral problems and confusion. Negative for sleep disturbance. The patient is nervous/anxious.     Immunization History  Administered Date(s) Administered  . Influenza-Unspecified 02/26/2019, 03/07/2020  . Moderna Sars-Covid-2 Vaccination 05/28/2019, 06/25/2019, 04/03/2020  . Pneumococcal-Unspecified 05/22/2016   Pertinent  Health Maintenance Due  Topic Date Due  . DEXA SCAN  Never done  . PNA vac Low Risk Adult (2 of 2 - PCV13) 05/22/2017  . INFLUENZA VACCINE  12/24/2020   No flowsheet data found. Functional Status Survey:    Vitals:   09/10/20 1234  BP: 130/70  Pulse: 76  Resp: 20  Temp: (!) 97 F (36.1 C)  SpO2: 97%   There is no height or weight on file to calculate BMI. Physical Exam Vitals and nursing note reviewed.  Constitutional:      Appearance: Normal appearance.  HENT:     Head: Normocephalic and atraumatic.     Mouth/Throat:     Mouth: Mucous membranes are moist.  Eyes:     Extraocular Movements: Extraocular movements intact.     Conjunctiva/sclera: Conjunctivae normal.     Pupils: Pupils are equal, round, and reactive to light.  Cardiovascular:     Rate and Rhythm: Normal rate and regular rhythm.     Heart sounds: No murmur heard.   Pulmonary:     Effort: Pulmonary effort is normal.     Breath sounds: No rales.  Abdominal:     Palpations: Abdomen is soft.     Tenderness: There is no abdominal tenderness.  Musculoskeletal:     Cervical back: Normal range of motion and neck supple.     Right lower leg: No edema.     Left lower leg: No edema.  Skin:    General: Skin is warm and dry.  Neurological:     General: No focal deficit present.     Mental Status: She is alert.     Gait: Gait abnormal.     Comments: Oriented to person  Psychiatric:        Mood and Affect: Mood normal.     Comments: Confused, pleasant, conversing     Labs reviewed: Recent Labs     05/11/20 0532 05/12/20 0525 05/13/20 0516 05/14/20 0440 05/29/20 0000 06/06/20 0000 08/02/20 0000  NA 139 141 139 139 142 142 141  K 3.7 3.5 3.4* 4.0 3.2* 4.0 4.8  CL 103 103 102 104 103 105 104  CO2 26 27 26 25  30* 30* 30*  GLUCOSE 88 87 113* 80  --   --   --   BUN 13 13 13 14 19 16 13   CREATININE 0.96 1.09* 1.11* 1.08* 1.5* 1.3* 1.0  CALCIUM 8.8* 8.7* 9.0 9.0 10.1 10.0 9.3  MG 2.2 2.2 2.1  --   --   --   --   PHOS 3.9 3.5 3.4  --   --   --   --  Recent Labs    05/08/20 0446 05/10/20 0511 05/13/20 0516 05/29/20 0000 06/06/20 0000  AST 30 21 18 17 16   ALT 64* 38 27 12 13   ALKPHOS 152* 141* 112 76 68  BILITOT 0.6 0.6 0.5  --   --   PROT 5.7* 5.7* 6.5  --   --   ALBUMIN 2.4* 2.3* 2.7* 3.7 3.5   Recent Labs    03/28/20 0000 03/28/20 0000 05/02/20 2225 05/06/20 1937 05/08/20 0704 05/10/20 0511 05/12/20 0525 05/13/20 0516 05/29/20 0000  WBC 5.4   < > 7.3 6.9   < > 6.4 7.5 7.3 5.5  NEUTROABS 3,202.00  --  6.5 5.3  --   --   --   --   --   HGB 13.2  --  11.4* 11.3*   < > 10.5* 10.9* 11.9* 12.5  HCT 40  --  36.1 35.3*   < > 32.7* 33.9* 36.4 37  MCV  --    < > 93.0 93.1   < > 92.1 92.9 92.9  --   PLT 199  --  132* 117*   < > 228 242 266 206   < > = values in this interval not displayed.   Lab Results  Component Value Date   TSH 5.78 03/28/2020   No results found for: HGBA1C No results found for: CHOL, HDL, LDLCALC, LDLDIRECT, TRIG, CHOLHDL  Significant Diagnostic Results in last 30 days:  No results found.  Assessment/Plan: Fall near fall 09/07/20, fall when the patient was found sitting on the floor in her bathroom, no apparent injury noted, the patient ambulated with walker during my examination today, she is in her usual state of health.  May consider labs, may GDR of Seroquel 25mg  bid, Lexapro 10mg  qd since 08/17/20  Depression with anxiety Anxiety/depression, takesSeroquel 25mg  bid since 08/17/20, takes  Clonazepam qhs, prn Lorazepam,  Lexapro 10mg   since 08/17/20   Vascular dementia (HCC)  resides in memory care unit [facility], takes Memantine, Donepezil, TSH 2.63 06/05/20   CKD (chronic kidney disease) stage 3, GFR 30-59 ml/min (HCC) CKD Bun/creat 13/1.0 08/02/20     Family/ staff Communication: plan of care reviewed with the patient and charge nurse.   Labs/tests ordered: none  Time spend 35 minutes.

## 2020-09-10 NOTE — Assessment & Plan Note (Signed)
Anxiety/depression, takesSeroquel 25mg bid since 08/17/20, takes  Clonazepam qhs, prn Lorazepam,  Lexapro 10mg since 08/17/20 

## 2020-09-10 NOTE — Assessment & Plan Note (Signed)
near fall 09/07/20, fall when the patient was found sitting on the floor in her bathroom, no apparent injury noted, the patient ambulated with walker during my examination today, she is in her usual state of health.  May consider labs, may GDR of Seroquel 25mg  bid, Lexapro 10mg  qd since 08/17/20

## 2020-09-11 ENCOUNTER — Encounter: Payer: Self-pay | Admitting: Nurse Practitioner

## 2020-09-25 ENCOUNTER — Other Ambulatory Visit: Payer: Self-pay | Admitting: Orthopedic Surgery

## 2020-09-25 DIAGNOSIS — G301 Alzheimer's disease with late onset: Secondary | ICD-10-CM

## 2020-09-25 MED ORDER — LORAZEPAM 0.5 MG PO TABS: 0.5000 mg | ORAL_TABLET | Freq: Four times a day (QID) | ORAL | 0 refills | Status: AC | PRN

## 2020-10-04 ENCOUNTER — Non-Acute Institutional Stay (SKILLED_NURSING_FACILITY): Payer: Medicare Other | Admitting: Nurse Practitioner

## 2020-10-04 ENCOUNTER — Encounter: Payer: Self-pay | Admitting: Nurse Practitioner

## 2020-10-04 DIAGNOSIS — F0151 Vascular dementia with behavioral disturbance: Secondary | ICD-10-CM | POA: Diagnosis not present

## 2020-10-04 DIAGNOSIS — F418 Other specified anxiety disorders: Secondary | ICD-10-CM

## 2020-10-04 DIAGNOSIS — N1831 Chronic kidney disease, stage 3a: Secondary | ICD-10-CM | POA: Diagnosis not present

## 2020-10-04 DIAGNOSIS — F01518 Vascular dementia, unspecified severity, with other behavioral disturbance: Secondary | ICD-10-CM

## 2020-10-04 NOTE — Assessment & Plan Note (Signed)
resides in memory care unit [facility], takes Memantine, Donepezil, TSH 2.63 06/05/20  

## 2020-10-04 NOTE — Progress Notes (Addendum)
Location:   [facility] Room Number: N103 Place of Service:  SNF (31) Provider:  Jovian Lembcke Johnney Ou, NP    Patient Care Team: [name], MD as PCP - General (Internal Medicine)  Extended Emergency Contact Information Primary Emergency Contact: Ramiro Harvest, [facility]) [facility] Address: [address]          [place], [place] [address] [name] of [place] Phone: [phone] Relation: Relative Secondary Emergency Contact: Marlowe Kays Mobile Phone: [phone] Relation: Relative  Code Status: DNR   Goals of care: Advanced Directive information Advanced Directives 10/04/2020  Does Patient Have a Medical Advance Directive? Yes  Type of Estate agent of [place];Living will;Out of facility DNR (pink MOST or yellow form)  Does patient want to make changes to medical advance directive? No - Patient declined  Copy of Healthcare Power of Attorney in Chart? Yes - validated most recent copy scanned in chart (See row information)  Pre-existing out of facility DNR order (yellow form or pink MOST form) Yellow form placed in chart (order not valid for inpatient use)     Chief Complaint  Patient presents with  . Medical Management of Chronic Issues    Routine follow up    HPI:  Pt is a 87 y.o. female seen today for medical management of chronic diseases.      Dementia, resides in memory care unit [facility], takes Memantine, Donepezil, TSH 2.63 06/05/20 Anxiety/depression, takesSeroquel25mg  bid since 08/17/20, takesClonazepam qhs, prn Lorazepam, Lexapro 10mg  since 08/17/20 CKD Bun/creat13/1.0 08/02/20              Past Medical History:  Diagnosis Date  . Arthritis   . Dementia (HCC)   . Hx of concussion    10/02/20 [employer]  . Hyperlipidemia   . Vitamin D deficiency, unspecified    Past Surgical History:  Procedure Laterality Date  . CHOLECYSTECTOMY      Allergies  Allergen Reactions  . Fish-Derived  Products   . Pravachol [Pravastatin Sodium]     Allergies as of 10/04/2020      Reactions   Fish-derived Products    Pravachol [pravastatin Sodium]       Medication List       Accurate as of Oct 04, 2020 11:59 PM. If you have any questions, ask your nurse or doctor.        STOP taking these medications   lactose free nutrition Liqd Stopped by: Alizabeth Antonio X Cris Gibby, NP     TAKE these medications   CALCIUM-VITAMIN D PO Take 1 tablet by mouth in the morning and at bedtime. 600mg  (1,500mg ) - 400 unit   clonazePAM 0.5 MG tablet Commonly known as: KLONOPIN Take 1 tablet (0.5 mg total) by mouth at bedtime.   donepezil 10 MG tablet Commonly known as: ARICEPT Take 5 mg by mouth at bedtime.   escitalopram 5 MG tablet Commonly known as: LEXAPRO Take 10 mg by mouth daily.   LORazepam 0.5 MG tablet Commonly known as: ATIVAN Take 1 tablet (0.5 mg total) by mouth every 6 (six) hours as needed for up to 14 days for anxiety (agitation).   memantine 10 MG tablet Commonly known as: NAMENDA Take 10 mg by mouth 2 (two) times daily.   POLYVINYL ALCOHOL-POVIDONE OP Place 2 drops into both eyes 3 (three) times daily. drops; 0.5-0.6 %; amt: 2 drops each eye; ophthalmic (eye)   QUEtiapine 25 MG tablet Commonly known as: SEROQUEL Take 25 mg by mouth 2 (two) times  daily.       Review of Systems  Constitutional: Negative for fatigue, fever and unexpected weight change.  HENT: Positive for hearing loss. Negative for congestion and voice change.   Eyes: Negative for visual disturbance.       Dry eyes  Respiratory: Negative for cough.   Cardiovascular: Negative for leg swelling.  Gastrointestinal: Negative for abdominal pain and constipation.  Genitourinary: Negative for dysuria and urgency.  Musculoskeletal: Positive for arthralgias and gait problem.       Gait has improved, rails or furniture walking sometimes.   Skin: Negative for color change.  Neurological: Negative for speech  difficulty, weakness and light-headedness.       Dementia  Psychiatric/Behavioral: Positive for behavioral problems and confusion. Negative for sleep disturbance. The patient is nervous/anxious.        Mood is stabilizing.     Immunization History  Administered Date(s) Administered  . Influenza-Unspecified 02/26/2019, 03/07/2020  . Moderna Sars-Covid-2 Vaccination 05/28/2019, 06/25/2019, 04/03/2020  . Pneumococcal-Unspecified 05/22/2016   Pertinent  Health Maintenance Due  Topic Date Due  . DEXA SCAN  Never done  . PNA vac Low Risk Adult (2 of 2 - PCV13) 05/22/2017  . INFLUENZA VACCINE  12/24/2020   No flowsheet data found. Functional Status Survey:    Vitals:   10/04/20 0927  BP: 120/64  Pulse: 88  Resp: 17  Temp: 98 F (36.7 C)  SpO2: 98%  Weight: 150 lb 6.4 oz (68.2 kg)  Height: 5\' 6"  (1.676 m)   Body mass index is 24.28 kg/m. Physical Exam Vitals and nursing note reviewed.  Constitutional:      Appearance: Normal appearance.  HENT:     Head: Normocephalic and atraumatic.     Mouth/Throat:     Mouth: Mucous membranes are moist.  Eyes:     Extraocular Movements: Extraocular movements intact.     Conjunctiva/sclera: Conjunctivae normal.     Pupils: Pupils are equal, round, and reactive to light.  Cardiovascular:     Rate and Rhythm: Normal rate and regular rhythm.     Heart sounds: No murmur heard.   Pulmonary:     Effort: Pulmonary effort is normal.     Breath sounds: No rales.  Abdominal:     Palpations: Abdomen is soft.     Tenderness: There is no abdominal tenderness.  Musculoskeletal:     Right lower leg: No edema.     Left lower leg: No edema.  Skin:    General: Skin is warm and dry.  Neurological:     General: No focal deficit present.     Mental Status: She is alert.     Gait: Gait abnormal.     Comments: Oriented to person  Psychiatric:        Mood and Affect: Mood normal.     Comments: Confused, pleasant, conversing     Labs  reviewed: Recent Labs    05/11/20 0532 05/12/20 0525 05/13/20 0516 05/14/20 0440 05/29/20 0000 06/06/20 0000 08/02/20 0000  NA 139 141 139 139 142 142 141  K 3.7 3.5 3.4* 4.0 3.2* 4.0 4.8  CL 103 103 102 104 103 105 104  CO2 26 27 26 25  30* 30* 30*  GLUCOSE 88 87 113* 80  --   --   --   BUN 13 13 13 14 19 16 13   CREATININE 0.96 1.09* 1.11* 1.08* 1.5* 1.3* 1.0  CALCIUM 8.8* 8.7* 9.0 9.0 10.1 10.0 9.3  MG 2.2 2.2 2.1  --   --   --   --  PHOS 3.9 3.5 3.4  --   --   --   --    Recent Labs    05/08/20 0446 05/10/20 0511 05/13/20 0516 05/29/20 0000 06/06/20 0000  AST 30 21 18 17 16   ALT 64* 38 27 12 13   ALKPHOS 152* 141* 112 76 68  BILITOT 0.6 0.6 0.5  --   --   PROT 5.7* 5.7* 6.5  --   --   ALBUMIN 2.4* 2.3* 2.7* 3.7 3.5   Recent Labs    03/28/20 0000 03/28/20 0000 05/02/20 2225 05/06/20 1937 05/08/20 0704 05/10/20 0511 05/12/20 0525 05/13/20 0516 05/29/20 0000  WBC 5.4   < > 7.3 6.9   < > 6.4 7.5 7.3 5.5  NEUTROABS 3,202.00  --  6.5 5.3  --   --   --   --   --   HGB 13.2  --  11.4* 11.3*   < > 10.5* 10.9* 11.9* 12.5  HCT 40  --  36.1 35.3*   < > 32.7* 33.9* 36.4 37  MCV  --    < > 93.0 93.1   < > 92.1 92.9 92.9  --   PLT 199  --  132* 117*   < > 228 242 266 206   < > = values in this interval not displayed.   Lab Results  Component Value Date   TSH 5.78 03/28/2020   No results found for: HGBA1C No results found for: CHOL, HDL, LDLCALC, LDLDIRECT, TRIG, CHOLHDL  Significant Diagnostic Results in last 30 days:  No results found.  Assessment/Plan Vascular dementia (HCC)  resides in memory care unit [facility], takes Memantine, Donepezil, TSH 2.63 06/05/20   Depression with anxiety Her mood is stabilizing, takesSeroquel25mg  bid since 08/17/20, takesClonazepam qhs, prn Lorazepam, Lexapro 10mg  since 08/17/20   CKD (chronic kidney disease) stage 3, GFR 30-59 ml/min (HCC) Bun/creat13/1.0 08/02/20           Family/ staff Communication: plan of care  reviewed with the patient and charge nurse.   Labs/tests ordered:  none  Time spend 35 minutes.

## 2020-10-04 NOTE — Assessment & Plan Note (Signed)
Her mood is stabilizing, takesSeroquel25mg  bid since 08/17/20, takesClonazepam qhs, prn Lorazepam, Lexapro 10mg  since 08/17/20

## 2020-10-04 NOTE — Assessment & Plan Note (Signed)
Bun/creat13/1.0 08/02/20

## 2020-10-16 ENCOUNTER — Other Ambulatory Visit: Payer: Self-pay

## 2020-10-16 ENCOUNTER — Non-Acute Institutional Stay (SKILLED_NURSING_FACILITY): Payer: Medicare Other | Admitting: Internal Medicine

## 2020-10-16 ENCOUNTER — Encounter: Payer: Self-pay | Admitting: Internal Medicine

## 2020-10-16 DIAGNOSIS — F418 Other specified anxiety disorders: Secondary | ICD-10-CM | POA: Diagnosis not present

## 2020-10-16 DIAGNOSIS — G301 Alzheimer's disease with late onset: Secondary | ICD-10-CM

## 2020-10-16 DIAGNOSIS — F0281 Dementia in other diseases classified elsewhere with behavioral disturbance: Secondary | ICD-10-CM | POA: Diagnosis not present

## 2020-10-16 DIAGNOSIS — N1831 Chronic kidney disease, stage 3a: Secondary | ICD-10-CM | POA: Diagnosis not present

## 2020-10-16 MED ORDER — CLONAZEPAM 0.5 MG PO TABS: 0.5000 mg | ORAL_TABLET | Freq: Every day | ORAL | 0 refills | Status: DC

## 2020-10-16 NOTE — Telephone Encounter (Signed)
Refill request received from [facility] for Clonopin 0.5 mg tablet. Medication Pended and sent to [facility] for approval.

## 2020-10-16 NOTE — Progress Notes (Signed)
Location:   [facility] Room Number: 103 Place of Service:  SNF [phone]) Provider:  Einar Crow MD  [name], MD  Patient Care Team: [name], MD as PCP - General (Internal Medicine)  Extended Emergency Contact Information Primary Emergency Contact: Ramiro Harvest, [facility]) [facility] Address: [address]          [place], [place] [address] [name] of [place] Phone: [phone] Relation: Relative Secondary Emergency Contact: Marlowe Kays Mobile Phone: [phone] Relation: Relative  Code Status:  DNR Goals of care: Advanced Directive information Advanced Directives 10/16/2020  Does Patient Have a Medical Advance Directive? Yes  Type of Estate agent of [place];Living will;Out of facility DNR (pink MOST or yellow form)  Does patient want to make changes to medical advance directive? No - Patient declined  Copy of Healthcare Power of Attorney in Chart? Yes - validated most recent copy scanned in chart (See row information)  Pre-existing out of facility DNR order (yellow form or pink MOST form) Yellow form placed in chart (order not valid for inpatient use)     Chief Complaint  Patient presents with  . Acute Visit    HPI:  Pt is a 87 y.o. female seen today for Acute Visit for Follow up   Patient has Dementia and Lives in Memory Unit  Also was Admitted to the hospital from 12/12-12/20 for Intraabdominal Abscess/ Liver abscess IR was consulted. Underwent Liver Abscess Aspiration on 05/07/20 Drain was placed. Abscess Grew Pseudomonas Was treated with Zosyn for 7 days.Infectious disease no further antibiotics was necessary repeat CT scan done after the removal of drain showed resolution of abscess.  Has h/o Dementia CT scanModerate severity cerebral atrophy and microvascular disease  Doing well with her behaviors Still get upset sometimes but Ativan Prn stays Effective Walks with the walker now due to risk of  falls Has gained weight Appetite is good.      Past Medical History:  Diagnosis Date  . Arthritis   . Dementia (HCC)   . Hx of concussion    [employer] [place]  . Hyperlipidemia   . Vitamin D deficiency, unspecified    Past Surgical History:  Procedure Laterality Date  . CHOLECYSTECTOMY      Allergies  Allergen Reactions  . Fish-Derived Products   . Pravachol [Pravastatin Sodium]     Allergies as of 10/16/2020      Reactions   Fish-derived Products    Pravachol [pravastatin Sodium]       Medication List       Accurate as of Oct 16, 2020  4:13 PM. If you have any questions, ask your nurse or doctor.        CALCIUM-VITAMIN D PO Take 1 tablet by mouth in the morning and at bedtime. 600mg  (1,500mg ) - 400 unit   clonazePAM 0.5 MG tablet Commonly known as: KLONOPIN Take 1 tablet (0.5 mg total) by mouth at bedtime.   donepezil 5 MG tablet Commonly known as: ARICEPT Take 5 mg by mouth at bedtime.   escitalopram 10 MG tablet Commonly known as: LEXAPRO Take 10 mg by mouth daily.   LORazepam 0.5 MG tablet Commonly known as: ATIVAN Take 0.5 mg by mouth every 6 (six) hours as needed for anxiety.   memantine 10 MG tablet Commonly known as: NAMENDA Take 10 mg by mouth 2 (two) times daily.   POLYVINYL ALCOHOL-POVIDONE OP Place 2 drops into both eyes 3 (three) times daily. drops; 0.5-0.6 %;  amt: 2 drops each eye; ophthalmic (eye)   QUEtiapine 25 MG tablet Commonly known as: SEROQUEL Take 25 mg by mouth 2 (two) times daily.       Review of Systems  Unable to perform ROS: Dementia    Immunization History  Administered Date(s) Administered  . Influenza-Unspecified 02/26/2019, 03/07/2020  . Moderna Sars-Covid-2 Vaccination 05/28/2019, 06/25/2019, 04/03/2020  . Pneumococcal-Unspecified 05/22/2016   Pertinent  Health Maintenance Due  Topic Date Due  . DEXA SCAN  Never done  . PNA vac Low Risk Adult (2 of 2 - PCV13) 05/22/2017  . INFLUENZA  VACCINE  12/24/2020   No flowsheet data found. Functional Status Survey:    Vitals:   10/16/20 1309  BP: 126/72  Pulse: 80  Resp: 17  Temp: 97.8 F (36.6 C)  SpO2: 97%  Weight: 150 lb 6.4 oz (68.2 kg)  Height: 5\' 6"  (1.676 m)   Body mass index is 24.28 kg/m. Physical Exam Constitutional:  Well-developed and well-nourished.  HENT:  Head: Normocephalic.  Mouth/Throat: Oropharynx is clear and moist.  Eyes: Pupils are equal, round, and reactive to light.  Neck: Neck supple.  Cardiovascular: Normal rate and normal heart sounds.  No murmur heard. Pulmonary/Chest: Effort normal and breath sounds normal. No respiratory distress. No wheezes. She has no rales.  Abdominal: Soft. Bowel sounds are normal. No distension. There is no tenderness. There is no rebound.  Musculoskeletal: No edema.  Lymphadenopathy: none Neurological: Alert Responds appropriately No Focal Deficits.  Skin: Skin is warm and dry.  Psychiatric: Normal mood and affect. Behavior is normal. Thought content normal.  Labs reviewed: Recent Labs    05/11/20 0532 05/12/20 0525 05/13/20 0516 05/14/20 0440 05/29/20 0000 06/06/20 0000 08/02/20 0000  NA 139 141 139 139 142 142 141  K 3.7 3.5 3.4* 4.0 3.2* 4.0 4.8  CL 103 103 102 104 103 105 104  CO2 26 27 26 25  30* 30* 30*  GLUCOSE 88 87 113* 80  --   --   --   BUN 13 13 13 14 19 16 13   CREATININE 0.96 1.09* 1.11* 1.08* 1.5* 1.3* 1.0  CALCIUM 8.8* 8.7* 9.0 9.0 10.1 10.0 9.3  MG 2.2 2.2 2.1  --   --   --   --   PHOS 3.9 3.5 3.4  --   --   --   --    Recent Labs    05/08/20 0446 05/10/20 0511 05/13/20 0516 05/29/20 0000 06/06/20 0000  AST 30 21 18 17 16   ALT 64* 38 27 12 13   ALKPHOS 152* 141* 112 76 68  BILITOT 0.6 0.6 0.5  --   --   PROT 5.7* 5.7* 6.5  --   --   ALBUMIN 2.4* 2.3* 2.7* 3.7 3.5   Recent Labs    03/28/20 0000 03/28/20 0000 05/02/20 2225 05/06/20 1937 05/08/20 0704 05/10/20 0511 05/12/20 0525 05/13/20 0516 05/29/20 0000  WBC  5.4   < > 7.3 6.9   < > 6.4 7.5 7.3 5.5  NEUTROABS 3,202.00  --  6.5 5.3  --   --   --   --   --   HGB 13.2  --  11.4* 11.3*   < > 10.5* 10.9* 11.9* 12.5  HCT 40  --  36.1 35.3*   < > 32.7* 33.9* 36.4 37  MCV  --    < > 93.0 93.1   < > 92.1 92.9 92.9  --   PLT 199  --  132*  117*   < > 228 242 266 206   < > = values in this interval not displayed.   Lab Results  Component Value Date   TSH 5.78 03/28/2020   No results found for: HGBA1C No results found for: CHOL, HDL, LDLCALC, LDLDIRECT, TRIG, CHOLHDL  Significant Diagnostic Results in last 30 days:  No results found.  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

## 2020-10-31 ENCOUNTER — Encounter: Payer: Self-pay | Admitting: Nurse Practitioner

## 2020-10-31 ENCOUNTER — Non-Acute Institutional Stay (SKILLED_NURSING_FACILITY): Payer: Medicare Other | Admitting: Nurse Practitioner

## 2020-10-31 DIAGNOSIS — N1831 Chronic kidney disease, stage 3a: Secondary | ICD-10-CM | POA: Diagnosis not present

## 2020-10-31 DIAGNOSIS — F418 Other specified anxiety disorders: Secondary | ICD-10-CM

## 2020-10-31 DIAGNOSIS — F01518 Vascular dementia, unspecified severity, with other behavioral disturbance: Secondary | ICD-10-CM

## 2020-10-31 DIAGNOSIS — F0151 Vascular dementia with behavioral disturbance: Secondary | ICD-10-CM

## 2020-10-31 NOTE — Assessment & Plan Note (Signed)
Restlessness, emotional outbursts, prn Lorazepam used almost daily, will increase Seroquel to 25mg  tid, continue Clonazepam, prn Lorazepam, and Lexaprol. Observe.

## 2020-10-31 NOTE — Progress Notes (Signed)
Location:   [facility] Room Number: F093 Place of Service:  SNF (31) Provider: [facility] Audrena Talaga NP  [name], MD  Patient Care Team: [name], MD as PCP - General (Internal Medicine)  Extended Emergency Contact Information Primary Emergency Contact: Bertrum Sol, [facility]) [facility] [place] Address: [address]          [place], [place] [address] [name] of [place] Phone: [phone] Relation: Relative Secondary Emergency Contact: [name] Mobile Phone: [phone] Relation: Relative  Code Status:  DNR Goals of care: Advanced Directive information Advanced Directives 10/31/2020  Does Patient Have a Medical Advance Directive? Yes  Type of Paramedic of [place];Living will  Does patient want to make changes to medical advance directive? No - Patient declined  Copy of [place] in Chart? Yes - validated most recent copy scanned in chart (See row information)  Pre-existing out of facility DNR order (yellow form or pink MOST form) -     Chief Complaint  Patient presents with   Medical Management of Chronic Issues    Routine follow up.    Health Maintenance    Discuss need for TD/Tdap vaccine, shingles vaccine, DEXA scan, and PNA vaccine.     HPI:  Pt is a 87 y.o. female seen today for medical management of chronic diseases.    Dementia, resides in memory care unit [facility], takes Memantine, Donepezil, TSH 2.63 06/05/20             Anxiety/depression, takes Seroquel 46m bid since 08/17/20, takes Clonazepam qhs, prn Lorazepam-used almost daily, Lexapro 167msince 08/17/20             CKD Bun/creat 13/1.0 08/02/20               Past Medical History:  Diagnosis Date   Arthritis    Dementia (HCDaisy   Hx of [place]ussion    [place] Hyper[place]    Vitamin D deficiency, unspecified    Past Surgical History:  Procedure Laterality Date   CHOLECYSTECTOMY      Allergies  Allergen Reactions    Fish-Derived Products    Pravachol [Pravastatin Sodium]     Allergies as of 10/31/2020       Reactions   Fish-derived Products    Pravachol [pravastatin Sodium]         Medication List        Accurate as of October 31, 2020 11:59 PM. If you have any questions, ask your nurse or doctor.          CALCIUM-VITAMIN D PO Take 1 tablet by mouth in the morning and at bedtime. 60045m1,500m30m 400 unit   clonazePAM 0.5 MG tablet Commonly known as: KLONOPIN Take 1 tablet (0.5 mg total) by mouth at bedtime.   donepezil 5 MG tablet Commonly known as: ARICEPT Take 5 mg by mouth at bedtime.   escitalopram 10 MG tablet Commonly known as: LEXAPRO Take 10 mg by mouth daily.   LORazepam 0.5 MG tablet Commonly known as: ATIVAN Take 0.5 mg by mouth every 6 (six) hours as needed for anxiety.   memantine 10 MG tablet Commonly known as: NAMENDA Take 10 mg by mouth 2 (two) times daily.   POLYVINYL ALCOHOL-POVIDONE OP Place 2 drops into both eyes 3 (three) times daily. drops; 0.5-0.6 %; amt: 2 drops each eye; ophthalmic (eye)   QUEtiapine 25 MG tablet Commonly known as: SEROQUEL Take 25 mg by mouth  2 (two) times daily.        Review of Systems  Constitutional:  Negative for activity change, appetite change and unexpected weight change.  HENT:  Positive for hearing loss. Negative for congestion and voice change.   Eyes:  Negative for visual disturbance.       Dry eyes  Respiratory:  Negative for cough.   Cardiovascular:  Negative for leg swelling.  Gastrointestinal:  Negative for abdominal pain and constipation.  Genitourinary:  Negative for dysuria and urgency.  Musculoskeletal:  Positive for arthralgias and gait problem.       Gait has improved, rails or furniture walking sometimes.   Skin:  Negative for color change.  Neurological:  Negative for speech difficulty, weakness and headaches.       Dementia  Psychiatric/Behavioral:  Positive for behavioral problems and  confusion. Negative for sleep disturbance. The patient is nervous/anxious.        Emotional outbursts, restlessness, irritable at times, difficulty redirecting.    Immunization History  Administered Date(s) Administered   Influenza-Unspecified 02/26/2019, 03/07/2020   Moderna Sars-Covid-2 Vaccination 05/28/2019, 06/25/2019, 04/03/2020   Pneumococcal-Unspecified 05/22/2016   Pertinent  Health Maintenance Due  Topic Date Due   DEXA SCAN  Never done   PNA vac Low Risk Adult (2 of 2 - PCV13) 05/22/2017   INFLUENZA VACCINE  12/24/2020   No flowsheet data found. Functional Status Survey:    Vitals:   10/31/20 1622  BP: 133/78  Pulse: 88  Resp: 18  Temp: 98.9 F (37.2 C)  SpO2: 98%  Weight: 147 lb 3.2 oz (66.8 kg)  Height: '5\' 6"'  (1.676 m)   Body mass index is 23.76 kg/m. Physical Exam Vitals and nursing note reviewed.  Constitutional:      Appearance: Normal appearance.  HENT:     Head: Normocephalic and atraumatic.     Mouth/Throat:     Mouth: Mucous membranes are moist.  Eyes:     Extraocular Movements: Extraocular movements intact.     Conjunctiva/sclera: Conjunctivae normal.     Pupils: Pupils are equal, round, and reactive to light.  Cardiovascular:     Rate and Rhythm: Normal rate and regular rhythm.     Heart sounds: No murmur heard. Pulmonary:     Effort: Pulmonary effort is normal.     Breath sounds: No rales.  Abdominal:     Palpations: Abdomen is soft.     Tenderness: There is no abdominal tenderness.  Musculoskeletal:     Right lower leg: No edema.     Left lower leg: No edema.  Skin:    General: Skin is warm and dry.  Neurological:     General: No focal deficit present.     Mental Status: She is alert.     Gait: Gait abnormal.     Comments: Oriented to person  Psychiatric:     Comments: Confused, pleasant, conversing. Met the patient at the entrance of the unit, she is ready to go home. The patient walked with me to her room for the visit.      Labs reviewed: Recent Labs    05/11/20 0532 05/12/20 0525 05/13/20 0516 05/14/20 0440 05/29/20 0000 06/06/20 0000 08/02/20 0000  NA 139 141 139 139 142 142 141  K 3.7 3.5 3.4* 4.0 3.2* 4.0 4.8  CL 103 103 102 104 103 105 104  CO2 '26 27 26 25 ' 30* 30* 30*  GLUCOSE 88 87 113* 80  --   --   --  BUN '13 13 13 14 19 16 13  ' CREATININE 0.96 1.09* 1.11* 1.08* 1.5* 1.3* 1.0  CALCIUM 8.8* 8.7* 9.0 9.0 10.1 10.0 9.3  MG 2.2 2.2 2.1  --   --   --   --   PHOS 3.9 3.5 3.4  --   --   --   --    Recent Labs    05/08/20 0446 05/10/20 0511 05/13/20 0516 05/29/20 0000 06/06/20 0000  AST '30 21 18 17 16  ' ALT 64* 38 '27 12 13  ' ALKPHOS 152* 141* 112 76 68  BILITOT 0.6 0.6 0.5  --   --   PROT 5.7* 5.7* 6.5  --   --   ALBUMIN 2.4* 2.3* 2.7* 3.7 3.5   Recent Labs    03/28/20 0000 03/28/20 0000 05/02/20 2225 05/06/20 1937 05/08/20 0704 05/10/20 0511 05/12/20 0525 05/13/20 0516 05/29/20 0000  WBC 5.4   < > 7.3 6.9   < > 6.4 7.5 7.3 5.5  NEUTROABS 3,202.00  --  6.5 5.3  --   --   --   --   --   HGB 13.2  --  11.4* 11.3*   < > 10.5* 10.9* 11.9* 12.5  HCT 40  --  36.1 35.3*   < > 32.7* 33.9* 36.4 37  MCV  --    < > 93.0 93.1   < > 92.1 92.9 92.9  --   PLT 199  --  132* 117*   < > 228 242 266 206   < > = values in this interval not displayed.   Lab Results  Component Value Date   TSH 5.78 03/28/2020   No results found for: HGBA1C No results found for: CHOL, HDL, LDLCALC, LDLDIRECT, TRIG, CHOLHDL  Significant Diagnostic Results in last 30 days:  No results found.  Assessment/Plan  Depression with anxiety Restlessness, emotional outbursts, prn Lorazepam used almost daily, will increase Seroquel to 63m tid, continue Clonazepam, prn Lorazepam, and Lexaprol. Observe.   Vascular dementia (HOrient  [facility], Donepezil, TSH 2.63 06/05/20  CKD (chronic kidney disease) stage 3, GFR 30-59 ml/min (HCC) Bun/creat 13/1.0 08/02/20           Family/  staff Communication: plan of care reviewed with the patient and charge nurse.   Labs/tests ordered:  none  Time spend 35 minutes.

## 2020-10-31 NOTE — Assessment & Plan Note (Signed)
Bun/creat13/1.0 08/02/20       

## 2020-10-31 NOTE — Assessment & Plan Note (Signed)
resides in memory care unit [facility], takes Memantine, Donepezil, TSH 2.63 06/05/20  

## 2020-11-01 ENCOUNTER — Encounter: Payer: Self-pay | Admitting: Nurse Practitioner

## 2020-11-08 ENCOUNTER — Encounter: Payer: Self-pay | Admitting: Nurse Practitioner

## 2020-11-08 ENCOUNTER — Non-Acute Institutional Stay (SKILLED_NURSING_FACILITY): Payer: Medicare Other | Admitting: Nurse Practitioner

## 2020-11-08 DIAGNOSIS — F0151 Vascular dementia with behavioral disturbance: Secondary | ICD-10-CM

## 2020-11-08 DIAGNOSIS — N1831 Chronic kidney disease, stage 3a: Secondary | ICD-10-CM | POA: Diagnosis not present

## 2020-11-08 DIAGNOSIS — R531 Weakness: Secondary | ICD-10-CM

## 2020-11-08 DIAGNOSIS — F418 Other specified anxiety disorders: Secondary | ICD-10-CM | POA: Diagnosis not present

## 2020-11-08 DIAGNOSIS — F01518 Vascular dementia, unspecified severity, with other behavioral disturbance: Secondary | ICD-10-CM

## 2020-11-08 NOTE — Assessment & Plan Note (Signed)
Dementia, resides in memory care unit [facility], takes Memantine, Donepezil, TSH 2.63 06/05/20  

## 2020-11-08 NOTE — Assessment & Plan Note (Signed)
Bun/creat13/1.0 08/02/20       

## 2020-11-08 NOTE — Assessment & Plan Note (Addendum)
generalized weakness, the patient stayed in bed upon my examination. Her baseline is up, walking frequently. The patient stated her diarrhea and upset stomach is resolved today. Denied abd pain, nausea, vomiting, or change of appetite. She is afebrile. No noted focal weakness. Will update CBC/diff, CMP/eGFR.  11/08/20 wbc 5.8, Hgb 12.8, plt 207, neutrophils 69.7%, Na 138, K 4.2, Bun 16, creat 1.35, eGFR 35 Observe.

## 2020-11-08 NOTE — Assessment & Plan Note (Signed)
Anxiety/depression, stabilizing, takes Seroquel 25mg  tid since 10/31/20, takes Clonazepam qhs, prn Lorazepam-used almost daily, Lexapro 10mg  since 08/17/20

## 2020-11-08 NOTE — Progress Notes (Signed)
Location:   [place] Room Number: N103 Place of Service:  SNF (31) Provider: Byrl Latin Otho Darner, NP     Patient Care Team: [name], MD as PCP - General (Internal Medicine)  Extended Emergency Contact Information Primary Emergency Contact: Bertrum Sol, [facility]) [facility] Address: [address]          [place], [place] [address] [name] of [place] Phone: [phone] Relation: Relative Secondary Emergency Contact: [name] Mobile Phone: [phone] Relation: Relative  Code Status:  DNR Goals of care: Advanced Directive information Advanced Directives 11/08/2020  Does Patient Have a Medical Advance Directive? Yes  Type of Paramedic of [place];Out of facility DNR (pink MOST or yellow form)  Does patient want to make changes to medical advance directive? No - Patient declined  Copy of [place] in Chart? Yes - validated most recent copy scanned in chart (See row information)  Pre-existing out of facility DNR order (yellow form or pink MOST form) Yellow form placed in chart (order not valid for inpatient use)     Chief Complaint  Patient presents with   Acute Visit    Patient complains of generalized weakness.     HPI:  Pt is a 87 y.o. female seen today for an acute visit for generalized weakness, the patient stayed in bed upon my examination. Her baseline is up, walking frequently. The patient stated her diarrhea and upset stomach is resolved today. Denied abd pain, nausea, vomiting, or change of appetite. She is afebrile. No noted focal weakness.   Dementia, resides in memory care unit [facility], takes Memantine, Donepezil, TSH 2.63 06/05/20             Anxiety/depression, takes Seroquel 45m tid since 10/31/20, takes Clonazepam qhs, prn Lorazepam-used almost daily, Lexapro 119msince 08/17/20             CKD Bun/creat 13/1.0 08/02/20              Past Medical History:  Diagnosis Date   Arthritis    Dementia  ([facility] [place]   Hx of concussion    [place] Hyperlipidemia    Vitamin D deficiency, unspecified    Past Surgical History:  Procedure Laterality Date   CHOLECYSTECTOMY      Allergies  Allergen Reactions   Fish-Derived Products    Pravachol [Pravastatin Sodium]     Allergies as of 11/08/2020       Reactions   Fish-derived Products    Pravachol [pravastatin Sodium]         Medication List        Accurate as of November 08, 2020  2:51 PM. If you have any questions, ask your nurse or doctor.          CALCIUM-VITAMIN D PO Take 1 tablet by mouth in the morning and at bedtime. 60064m1,500m59m 400 unit   clonazePAM 0.5 MG tablet Commonly known as: KLONOPIN Take 1 tablet (0.5 mg total) by mouth at bedtime.   donepezil 5 MG tablet Commonly known as: ARICEPT Take 5 mg by mouth at bedtime.   escitalopram 10 MG tablet Commonly known as: LEXAPRO Take 10 mg by mouth daily.   LORazepam 0.5 MG tablet Commonly known as: ATIVAN Take 0.5 mg by mouth every 6 (six) hours as needed for anxiety.   memantine 10 MG tablet Commonly known as: NAMENDA Take 10 mg by mouth 2 (two) times daily.   POLYVINYL  ALCOHOL-POVIDONE OP Place 2 drops into both eyes 3 (three) times daily. drops; 0.5-0.6 %; amt: 2 drops each eye; ophthalmic (eye)   QUEtiapine 25 MG tablet Commonly known as: SEROQUEL Take 25 mg by mouth 2 (two) times daily.        Review of Systems  Constitutional:  Positive for activity change, appetite change and fatigue. Negative for chills, diaphoresis and fever.  HENT:  Positive for hearing loss. Negative for congestion and voice change.   Eyes:  Negative for visual disturbance.       Dry eyes  Respiratory:  Negative for cough.   Cardiovascular:  Negative for leg swelling.  Gastrointestinal:  Negative for abdominal pain and constipation.  Genitourinary:  Negative for dysuria and urgency.  Musculoskeletal:  Positive for arthralgias and gait  problem.       Gait has improved, rails or furniture walking sometimes.   Skin:  Negative for color change.  Neurological:  Negative for speech difficulty, weakness and headaches.       Dementia  Psychiatric/Behavioral:  Positive for behavioral problems and confusion. Negative for sleep disturbance. The patient is nervous/anxious.        Stabilizing emotional outbursts, restlessness, irritable at times, difficulty redirecting.    Immunization History  Administered Date(s) Administered   Influenza-Unspecified 02/26/2019, 03/07/2020   Moderna Sars-Covid-2 Vaccination 05/28/2019, 06/25/2019, 04/03/2020   Pneumococcal-Unspecified 05/22/2016   Pertinent  Health Maintenance Due  Topic Date Due   DEXA SCAN  Never done   PNA vac Low Risk Adult (2 of 2 - PCV13) 05/22/2017   INFLUENZA VACCINE  12/24/2020   No flowsheet data found. Functional Status Survey:    Vitals:   11/08/20 1117  BP: 114/84  Pulse: 80  Resp: 18  Temp: (!) 97 F (36.1 C)  SpO2: 98%  Weight: 147 lb 3.2 oz (66.8 kg)  Height: '5\' 6"'  (1.676 m)   Body mass index is 23.76 kg/m. Physical Exam  Labs reviewed: Recent Labs    05/11/20 0532 05/12/20 0525 05/13/20 0516 05/14/20 0440 05/29/20 0000 06/06/20 0000 08/02/20 0000  NA 139 141 139 139 142 142 141  K 3.7 3.5 3.4* 4.0 3.2* 4.0 4.8  CL 103 103 102 104 103 105 104  CO2 '26 27 26 25 ' 30* 30* 30*  GLUCOSE 88 87 113* 80  --   --   --   BUN '13 13 13 14 19 16 13  ' CREATININE 0.96 1.09* 1.11* 1.08* 1.5* 1.3* 1.0  CALCIUM 8.8* 8.7* 9.0 9.0 10.1 10.0 9.3  MG 2.2 2.2 2.1  --   --   --   --   PHOS 3.9 3.5 3.4  --   --   --   --    Recent Labs    05/08/20 0446 05/10/20 0511 05/13/20 0516 05/29/20 0000 06/06/20 0000  AST '30 21 18 17 16  ' ALT 64* 38 '27 12 13  ' ALKPHOS 152* 141* 112 76 68  BILITOT 0.6 0.6 0.5  --   --   PROT 5.7* 5.7* 6.5  --   --   ALBUMIN 2.4* 2.3* 2.7* 3.7 3.5   Recent Labs    03/28/20 0000 03/28/20 0000 05/02/20 2225 05/06/20 1937  05/08/20 0704 05/10/20 0511 05/12/20 0525 05/13/20 0516 05/29/20 0000  WBC 5.4   < > 7.3 6.9   < > 6.4 7.5 7.3 5.5  NEUTROABS 3,202.00  --  6.5 5.3  --   --   --   --   --  HGB 13.2  --  11.4* 11.3*   < > 10.5* 10.9* 11.9* 12.5  HCT 40  --  36.1 35.3*   < > 32.7* 33.9* 36.4 37  MCV  --    < > 93.0 93.1   < > 92.1 92.9 92.9  --   PLT 199  --  132* 117*   < > 228 242 266 206   < > = values in this interval not displayed.   Lab Results  Component Value Date   TSH 5.78 03/28/2020   No results found for: HGBA1C No results found for: CHOL, HDL, LDLCALC, LDLDIRECT, TRIG, CHOLHDL  Significant Diagnostic Results in last 30 days:  No results found.  Assessment/Plan Generalized weakness generalized weakness, the patient stayed in bed upon my examination. Her baseline is up, walking frequently. The patient stated her diarrhea and upset stomach is resolved today. Denied abd pain, nausea, vomiting, or change of appetite. She is afebrile. No noted focal weakness. Will update CBC/diff, CMP/eGFR.  11/08/20 wbc 5.8, Hgb 12.8, plt 207, neutrophils 69.7%, Na 138, K 4.2, Bun 16, creat 1.35, eGFR 35 Observe.   Vascular dementia ([place]) Dementia, resides in memory care unit [facility], takes Memantine, Donepezil, TSH 2.63 06/05/20  Depression with anxiety Anxiety/depression, stabilizing, takes Seroquel 81m tid since 10/31/20, takes Clonazepam qhs, prn Lorazepam-used almost daily, Lexapro 166msince 08/17/20  CKD (chronic kidney disease) stage 3, GFR 30-59 ml/min ([facility]un/creat 13/1.0 08/02/20         Family/ staff Communication: plan of care reviewed with the patient and charge nurse.   Labs/tests ordered:  CBC/diff, CMP/eGFR  Time spend 35 minutes.

## 2020-11-12 ENCOUNTER — Non-Acute Institutional Stay (SKILLED_NURSING_FACILITY): Payer: Medicare Other | Admitting: Nurse Practitioner

## 2020-11-12 DIAGNOSIS — N1831 Chronic kidney disease, stage 3a: Secondary | ICD-10-CM

## 2020-11-12 DIAGNOSIS — F418 Other specified anxiety disorders: Secondary | ICD-10-CM | POA: Diagnosis not present

## 2020-11-12 DIAGNOSIS — F0151 Vascular dementia with behavioral disturbance: Secondary | ICD-10-CM | POA: Diagnosis not present

## 2020-11-12 DIAGNOSIS — F01518 Vascular dementia, unspecified severity, with other behavioral disturbance: Secondary | ICD-10-CM

## 2020-11-12 NOTE — Assessment & Plan Note (Signed)
Bun/creat 16/1.35 eGFR 35 11/08/20

## 2020-11-12 NOTE — Assessment & Plan Note (Signed)
takes Seroquel 25mg  tid since 10/31/20, takes Clonazepam qhs, prn Lorazepam-used almost daily, Lexapro 10mg  since 08/17/20 Will try Depakote 25mg  qam again, gaol is to wean off prn Lorazepam.

## 2020-11-12 NOTE — Progress Notes (Signed)
Location:   SNF [place] Room Number: 149 FWYOV of Service:  SNF (31) Provider: [facility] Myangel Summons NP  [name], MD  Patient Care Team: [name], MD as PCP - General (Internal Medicine)  Extended Emergency Contact Information Primary Emergency Contact: Bertrum Sol, [facility]) [facility] Address: [address]          [place], [place] [address] [name] of [place] Phone: [phone] Relation: Relative Secondary Emergency Contact: [name] Mobile Phone: [phone] Relation: Relative  Code Status: DNR Goals of care: Advanced Directive information Advanced Directives 11/08/2020  Does Patient Have a Medical Advance Directive? Yes  Type of Paramedic of [place];Out of facility DNR (pink MOST or yellow form)  Does patient want to make changes to medical advance directive? No - Patient declined  Copy of [place] in Chart? Yes - validated most recent copy scanned in chart (See row information)  Pre-existing out of facility DNR order (yellow form or pink MOST form) Yellow form placed in chart (order not valid for inpatient use)     Chief Complaint  Patient presents with   Acute Visit    Re-evaluate Lorazepam use.     HPI:  Pt is a 87 y.o. female seen today for an acute visit for request for re evaluation of Lorazepam uses, almost used everyday.   Dementia, resides in memory care unit [facility], takes Memantine, Donepezil, TSH 2.63 06/05/20             Anxiety/depression, takes Seroquel 43m tid since 10/31/20, takes Clonazepam qhs, prn Lorazepam-used almost daily, Lexapro 134msince 08/17/20             CKD Bun/creat 16/1.35 eGFR 35 11/08/20        Past Medical History:  Diagnosis Date   Arthritis    Dementia ([facility]   Hx of concussion    [place] Hyperlipidemia    Vitamin D deficiency, unspecified    Past Surgical History:  Procedure Laterality Date   CHOLECYSTECTOMY      Allergies   Allergen Reactions   Fish-Derived Products    Pravachol [Pravastatin Sodium]     Allergies as of 11/12/2020       Reactions   Fish-derived Products    Pravachol [pravastatin Sodium]         Medication List        Accurate as of November 12, 2020 11:59 PM. If you have any questions, ask your nurse or doctor.          CALCIUM-VITAMIN D PO Take 1 tablet by mouth in the morning and at bedtime. 60057m1,500m7m 400 unit   clonazePAM 0.5 MG tablet Commonly known as: KLONOPIN Take 1 tablet (0.5 mg total) by mouth at bedtime.   donepezil 5 MG tablet Commonly known as: ARICEPT Take 5 mg by mouth at bedtime.   escitalopram 10 MG tablet Commonly known as: LEXAPRO Take 10 mg by mouth daily.   memantine 10 MG tablet Commonly known as: NAMENDA Take 10 mg by mouth 2 (two) times daily.   POLYVINYL ALCOHOL-POVIDONE OP Place 2 drops into both eyes 3 (three) times daily. drops; 0.5-0.6 %; amt: 2 drops each eye; ophthalmic (eye)   QUEtiapine 25 MG tablet Commonly known as: SEROQUEL Take 25 mg by mouth 2 (two) times daily.        Review of Systems  Constitutional:  Negative for activity change, appetite change, fatigue and fever.  HENT:  Positive for hearing loss. Negative for congestion and voice change.   Eyes:  Negative for visual disturbance.       Dry eyes  Respiratory:  Negative for cough.   Cardiovascular:  Negative for leg swelling.  Gastrointestinal:  Negative for abdominal pain and constipation.  Genitourinary:  Negative for dysuria and urgency.  Musculoskeletal:  Positive for arthralgias and gait problem.       Gait has improved, rails or furniture walking sometimes.   Skin:  Negative for color change.  Neurological:  Negative for speech difficulty, weakness and headaches.       Dementia  Psychiatric/Behavioral:  Positive for behavioral problems and confusion. Negative for sleep disturbance. The patient is nervous/anxious.        Stabilizing emotional  outbursts, restlessness, irritable at times, difficulty redirecting.    Immunization History  Administered Date(s) Administered   Influenza-Unspecified 02/26/2019, 03/07/2020   Moderna Sars-Covid-2 Vaccination 05/28/2019, 06/25/2019, 04/03/2020   Pneumococcal-Unspecified 05/22/2016   Pertinent  Health Maintenance Due  Topic Date Due   DEXA SCAN  Never done   PNA vac Low Risk Adult (2 of 2 - PCV13) 05/22/2017   INFLUENZA VACCINE  12/24/2020   No flowsheet data found. Functional Status Survey:    Vitals:   11/12/20 1008  BP: 130/84  Pulse: 84  Resp: 18  Temp: (!) 97.1 F (36.2 C)  SpO2: 93%   There is no height or weight on file to calculate BMI. Physical Exam Vitals and nursing note reviewed.  Constitutional:      Appearance: Normal appearance.  HENT:     Head: Normocephalic and atraumatic.     Mouth/Throat:     Mouth: Mucous membranes are moist.  Eyes:     Extraocular Movements: Extraocular movements intact.     Conjunctiva/sclera: Conjunctivae normal.     Pupils: Pupils are equal, round, and reactive to light.  Cardiovascular:     Rate and Rhythm: Normal rate and regular rhythm.     Heart sounds: No murmur heard. Pulmonary:     Effort: Pulmonary effort is normal.     Breath sounds: No rales.  Abdominal:     Palpations: Abdomen is soft.     Tenderness: There is no abdominal tenderness.  Musculoskeletal:     Right lower leg: No edema.     Left lower leg: No edema.  Skin:    General: Skin is warm and dry.     Comments: Multiple ecchymoses R+L forearms, back of the left hand, no pain when palpated or with ROM  Neurological:     General: No focal deficit present.     Mental Status: She is alert.     Gait: Gait abnormal.     Comments: Oriented to person  Psychiatric:     Comments: Confused, pleasant, conversing. Met the patient at the entrance of the unit, she is ready to go home. The patient walked with me to her room for the visit.     Labs  reviewed: Recent Labs    05/11/20 0532 05/12/20 0525 05/13/20 0516 05/14/20 0440 05/29/20 0000 06/06/20 0000 08/02/20 0000  NA 139 141 139 139 142 142 141  K 3.7 3.5 3.4* 4.0 3.2* 4.0 4.8  CL 103 103 102 104 103 105 104  CO2 26 27 26 25 30* 30* 30*  GLUCOSE 88 87 113* 80  --   --   --   BUN 13 13 13 14 19 16 13  CREATININE 0.96 1.09* 1.11*   1.08* 1.5* 1.3* 1.0  CALCIUM 8.8* 8.7* 9.0 9.0 10.1 10.0 9.3  MG 2.2 2.2 2.1  --   --   --   --   PHOS 3.9 3.5 3.4  --   --   --   --    Recent Labs    05/08/20 0446 05/10/20 0511 05/13/20 0516 05/29/20 0000 06/06/20 0000  AST 30 21 18 17 16  ALT 64* 38 27 12 13  ALKPHOS 152* 141* 112 76 68  BILITOT 0.6 0.6 0.5  --   --   PROT 5.7* 5.7* 6.5  --   --   ALBUMIN 2.4* 2.3* 2.7* 3.7 3.5   Recent Labs    03/28/20 0000 03/28/20 0000 05/02/20 2225 05/06/20 1937 05/08/20 0704 05/10/20 0511 05/12/20 0525 05/13/20 0516 05/29/20 0000  WBC 5.4   < > 7.3 6.9   < > 6.4 7.5 7.3 5.5  NEUTROABS 3,202.00  --  6.5 5.3  --   --   --   --   --   HGB 13.2  --  11.4* 11.3*   < > 10.5* 10.9* 11.9* 12.5  HCT 40  --  36.1 35.3*   < > 32.7* 33.9* 36.4 37  MCV  --    < > 93.0 93.1   < > 92.1 92.9 92.9  --   PLT 199  --  132* 117*   < > 228 242 266 206   < > = values in this interval not displayed.   Lab Results  Component Value Date   TSH 5.78 03/28/2020   No results found for: HGBA1C No results found for: CHOL, HDL, LDLCALC, LDLDIRECT, TRIG, CHOLHDL  Significant Diagnostic Results in last 30 days:  No results found.  Assessment/Plan: Depression with anxiety takes Seroquel 25mg tid since 10/31/20, takes Clonazepam qhs, prn Lorazepam-used almost daily, Lexapro 10mg since 08/17/20 Will try Depakote 25mg qam again, gaol is to wean off prn Lorazepam.   Vascular dementia ([facility]) resides in memory care unit [facility], takes Memantine, Donepezil, TSH 2.63 06/05/20  CKD (chronic kidney disease) stage 3, GFR 30-59 ml/min ([facility]) Bun/creat 16/1.35 eGFR 35  11/08/20         Family/ staff Communication: plan of care reviewed with the patient and charge nurse.   Labs/tests ordered: none  Time spend 35 minutes.  

## 2020-11-12 NOTE — Assessment & Plan Note (Signed)
resides in memory care unit [facility], takes Memantine, Donepezil, TSH 2.63 06/05/20

## 2020-11-13 ENCOUNTER — Encounter: Payer: Self-pay | Admitting: Nurse Practitioner

## 2020-11-14 ENCOUNTER — Other Ambulatory Visit: Payer: Self-pay | Admitting: *Deleted

## 2020-11-14 MED ORDER — CLONAZEPAM 0.5 MG PO TABS: 0.5000 mg | ORAL_TABLET | Freq: Every day | ORAL | 0 refills | Status: DC

## 2020-11-14 NOTE — Telephone Encounter (Signed)
Received refill Request from [facility] Pended Rx and sent to [facility] for approval.  

## 2020-11-30 ENCOUNTER — Non-Acute Institutional Stay (SKILLED_NURSING_FACILITY): Payer: Medicare Other | Admitting: Nurse Practitioner

## 2020-11-30 ENCOUNTER — Encounter: Payer: Self-pay | Admitting: Nurse Practitioner

## 2020-11-30 DIAGNOSIS — N1831 Chronic kidney disease, stage 3a: Secondary | ICD-10-CM | POA: Diagnosis not present

## 2020-11-30 DIAGNOSIS — F0151 Vascular dementia with behavioral disturbance: Secondary | ICD-10-CM | POA: Diagnosis not present

## 2020-11-30 DIAGNOSIS — R112 Nausea with vomiting, unspecified: Secondary | ICD-10-CM | POA: Diagnosis not present

## 2020-11-30 DIAGNOSIS — F01518 Vascular dementia, unspecified severity, with other behavioral disturbance: Secondary | ICD-10-CM

## 2020-11-30 DIAGNOSIS — F418 Other specified anxiety disorders: Secondary | ICD-10-CM

## 2020-11-30 NOTE — Assessment & Plan Note (Signed)
Improving Anxiety/depression, takes Seroquel 25mg  tid since 10/31/20, takes Clonazepam qhs, prn Lorazepam-used almost daily, Lexapro 10mg  since 08/17/20, and Depakote 125mg  qam since 11/12/20

## 2020-11-30 NOTE — Progress Notes (Signed)
Location:   SNF [facility] Room Number: [id number] [id number] of Service:  SNF (31) Provider: [facility] Bartolo Montanye NP  [name], MD  Patient Care Team: [name], MD as PCP - General (Internal Medicine)  Extended Emergency Contact Information Primary Emergency Contact: Bertrum Sol, [facility]) [facility] Address: [address]          [place], [place] [address] [name] of [place] Phone: [phone] Relation: Relative Secondary Emergency Contact: [name] Mobile Phone: [phone] Relation: Relative  Code Status: DNR Goals of care: Advanced Directive information Advanced Directives 11/08/2020  Does Patient Have a Medical Advance Directive? Yes  Type of Paramedic of [place];Out of facility DNR (pink MOST or yellow form)  Does patient want to make changes to medical advance directive? No - Patient declined  Copy of [place] in Chart? Yes - validated most recent copy scanned in chart (See row information)  Pre-existing out of facility DNR order (yellow form or pink MOST form) Yellow form placed in chart (order not valid for inpatient use)     Chief Complaint  Patient presents with   Acute Visit    Fatigue, nausea, vomiting after lunch yesterday 11/29/20    HPI:  Pt is a 87 y.o. female seen today for an acute visit for fatigue, nauseated, vomited x 1 after lunch yesterday, returned to her usual state of health today.   Dementia, resides in memory care unit [facility], takes Memantine, Donepezil, TSH 2.63 06/05/20             Anxiety/depression, takes Seroquel 58m tid since 10/31/20, takes Clonazepam qhs, prn Lorazepam-used almost daily, Lexapro 156msince 08/17/20, and Depakote 12534mam since 11/12/20             CKD Bun/creat 16/1.35 eGFR 35 11/08/20       Past Medical History:  Diagnosis Date   Arthritis    Dementia (HCCLoaza  Hx of concussion    Sen[place]view AcresHyperlipidemia    Vitamin D deficiency, unspecified     Past Surgical History:  Procedure Laterality Date   CHOLECYSTECTOMY      Allergies  Allergen Reactions   Fish-Derived Products    Pravachol [Pravastatin Sodium]     Allergies as of 11/30/2020       Reactions   Fish-derived Products    Pravachol [pravastatin Sodium]         Medication List        Accurate as of November 30, 2020  3:02 PM. If you have any questions, ask your nurse or doctor.          CALCIUM-VITAMIN D PO Take 1 tablet by mouth in the morning and at bedtime. 600m39m,500mg26m400 unit   clonazePAM 0.5 MG tablet Commonly known as: KLONOPIN Take 1 tablet (0.5 mg total) by mouth at bedtime.   donepezil 5 MG tablet Commonly known as: ARICEPT Take 5 mg by mouth at bedtime.   escitalopram 10 MG tablet Commonly known as: LEXAPRO Take 10 mg by mouth daily.   memantine 10 MG tablet Commonly known as: NAMENDA Take 10 mg by mouth 2 (two) times daily.   POLYVINYL ALCOHOL-POVIDONE OP Place 2 drops into both eyes 3 (three) times daily. drops; 0.5-0.6 %; amt: 2 drops each eye; ophthalmic (eye)   QUEtiapine 25 MG tablet Commonly known as: SEROQUEL Take 25 mg by mouth 2 (two) times daily.        Review  of Systems  Constitutional:  Negative for appetite change, fatigue and fever.  HENT:  Positive for hearing loss. Negative for congestion and voice change.   Eyes:  Negative for visual disturbance.       Dry eyes  Respiratory:  Negative for cough.   Cardiovascular:  Negative for leg swelling.  Gastrointestinal:  Positive for nausea and vomiting. Negative for abdominal pain and constipation.       1x after lunch yesterday 11/29/20  Genitourinary:  Negative for dysuria and urgency.  Musculoskeletal:  Positive for arthralgias and gait problem.       Gait has improved, rails or furniture walking sometimes.   Skin:  Negative for color change.  Neurological:  Negative for speech difficulty, weakness and headaches.       Dementia  Psychiatric/Behavioral:   Positive for behavioral problems and confusion. Negative for sleep disturbance. The patient is nervous/anxious.        Improved.    Immunization History  Administered Date(s) Administered   Influenza-Unspecified 02/26/2019, 03/07/2020   Moderna Sars-Covid-2 Vaccination 05/28/2019, 06/25/2019, 04/03/2020   Pneumococcal-Unspecified 05/22/2016   Pertinent  Health Maintenance Due  Topic Date Due   DEXA SCAN  Never done   PNA vac Low Risk Adult (2 of 2 - PCV13) 05/22/2017   INFLUENZA VACCINE  12/24/2020   No flowsheet data found. Functional Status Survey:    Vitals:   11/30/20 1450  BP: 122/68  Pulse: 76  Resp: 16  Temp: 98.3 F (36.8 C)  SpO2: 95%   There is no height or weight on file to calculate BMI. Physical Exam Vitals and nursing note reviewed.  Constitutional:      Appearance: Normal appearance.  HENT:     Head: Normocephalic and atraumatic.     Mouth/Throat:     Mouth: Mucous membranes are moist.  Eyes:     Extraocular Movements: Extraocular movements intact.     Conjunctiva/sclera: Conjunctivae normal.     Pupils: Pupils are equal, round, and reactive to light.  Cardiovascular:     Rate and Rhythm: Normal rate and regular rhythm.     Heart sounds: No murmur heard. Pulmonary:     Effort: Pulmonary effort is normal.     Breath sounds: No rales.  Abdominal:     Palpations: Abdomen is soft.     Tenderness: There is no abdominal tenderness.  Musculoskeletal:     Right lower leg: No edema.     Left lower leg: No edema.  Skin:    General: Skin is warm and dry.     Comments: Multiple ecchymoses R+L forearms, back of the left hand, no pain when palpated or with ROM  Neurological:     General: No focal deficit present.     Mental Status: She is alert.     Gait: Gait abnormal.     Comments: Oriented to person  Psychiatric:     Comments: Confused, pleasant, conversing.     Labs reviewed: Recent Labs    05/11/20 0532 05/12/20 0525 05/13/20 0516  05/14/20 0440 05/29/20 0000 06/06/20 0000 08/02/20 0000  NA 139 141 139 139 142 142 141  K 3.7 3.5 3.4* 4.0 3.2* 4.0 4.8  CL 103 103 102 104 103 105 104  CO2 _0 30* 30* 30*  GLUCOSE 88 87 113* 80  --   --   --   BUN _1 CREATININE 0.96 1.09* 1.11* 1.08* 1.5* 1.3* 1.0  CALCIUM  8.8* 8.7* 9.0 9.0 10.1 10.0 9.3  MG 2.2 2.2 2.1  --   --   --   --   PHOS 3.9 3.5 3.4  --   --   --   --    Recent Labs    05/08/20 0446 05/10/20 0511 05/13/20 0516 05/29/20 0000 06/06/20 0000  AST _0 ALT 64* 38 _1 ALKPHOS 152* 141* 112 76 68  BILITOT 0.6 0.6 0.5  --   --   PROT 5.7* 5.7* 6.5  --   --   ALBUMIN 2.4* 2.3* 2.7* 3.7 3.5   Recent Labs    03/28/20 0000 03/28/20 0000 05/02/20 2225 05/06/20 1937 05/08/20 0704 05/10/20 0511 05/12/20 0525 05/13/20 0516 05/29/20 0000  WBC 5.4   < > 7.3 6.9   < > 6.4 7.5 7.3 5.5  NEUTROABS 3,202.00  --  6.5 5.3  --   --   --   --   --   HGB 13.2  --  11.4* 11.3*   < > 10.5* 10.9* 11.9* 12.5  HCT 40  --  36.1 35.3*   < > 32.7* 33.9* 36.4 37  MCV  --    < > 93.0 93.1   < > 92.1 92.9 92.9  --   PLT 199  --  132* 117*   < > 228 242 266 206   < > = values in this interval not displayed.   Lab Results  Component Value Date   TSH 5.78 03/28/2020   No results found for: HGBA1C No results found for: CHOL, HDL, LDLCALC, LDLDIRECT, TRIG, CHOLHDL  Significant Diagnostic Results in last 30 days:  No results found.  Assessment/Plan: Nausea & vomiting fatigue, nauseated, vomited x 1 after lunch yesterday, returned to her usual state of health today. Update CBC/diff, CMP/eGFR. Will add Omeprazole 79m qd for GI symptoms x 4 weeks.   Vascular dementia ([place] D[place], [facility] Memantine, Donepezil, TSH 2.63 06/05/20  Depression with anxiety Improving Anxiety/depression, takes Seroquel 261mtid since 10/31/20, takes Clonazepam qhs, prn Lorazepam-used almost daily, Lexapro 1023mince  08/17/20, and Depakote 125m14mm since 11/12/20  CKD (chronic kidney disease) stage 3, GFR 30-59 ml/min (HCC) CKD Bun/creat 16/1.35 eGFR 35 11/08/20         Family/ staff Communication: plan of care reviewed with the patient and charge nurse.   Labs/tests ordered: CBC/diff, CMP/eGFR  Time spend 35 minutes.

## 2020-11-30 NOTE — Assessment & Plan Note (Signed)
fatigue, nauseated, vomited x 1 after lunch yesterday, returned to her usual state of health today. Update CBC/diff, CMP/eGFR. Will add Omeprazole 12m qd for GI symptoms x 4 weeks.

## 2020-11-30 NOTE — Assessment & Plan Note (Signed)
CKD Bun/creat 16/1.35 eGFR 35 11/08/20

## 2020-11-30 NOTE — Assessment & Plan Note (Signed)
Dementia, resides in memory care unit [facility], takes Memantine, Donepezil, TSH 2.63 06/05/20

## 2020-12-05 LAB — COMPREHENSIVE METABOLIC PANEL
Albumin: 3.6 (ref 3.5–5.0)
Calcium: 9.5 (ref 8.7–10.7)
Globulin: 3.1

## 2020-12-05 LAB — CBC: RBC: 4.35 (ref 3.87–5.11)

## 2020-12-05 LAB — BASIC METABOLIC PANEL
BUN: 14 (ref 4–21)
CO2: 29 — AB (ref 13–22)
Chloride: 99 (ref 99–108)
Creatinine: 1.1 (ref 0.5–1.1)
Glucose: 82
Potassium: 5 (ref 3.4–5.3)
Sodium: 135 — AB (ref 137–147)

## 2020-12-05 LAB — HEPATIC FUNCTION PANEL
ALT: 3 — AB (ref 7–35)
AST: 11 — AB (ref 13–35)
Alkaline Phosphatase: 71 (ref 25–125)
Bilirubin, Total: 0.4

## 2020-12-05 LAB — CBC AND DIFFERENTIAL
HCT: 39 (ref 36–46)
Hemoglobin: 12.8 (ref 12.0–16.0)
Neutrophils Absolute: 4444
Platelets: 229 (ref 150–399)
WBC: 6.9

## 2020-12-06 ENCOUNTER — Encounter: Payer: Self-pay | Admitting: Nurse Practitioner

## 2020-12-06 ENCOUNTER — Non-Acute Institutional Stay (SKILLED_NURSING_FACILITY): Payer: Medicare Other | Admitting: Nurse Practitioner

## 2020-12-06 DIAGNOSIS — F418 Other specified anxiety disorders: Secondary | ICD-10-CM | POA: Diagnosis not present

## 2020-12-06 DIAGNOSIS — F0151 Vascular dementia with behavioral disturbance: Secondary | ICD-10-CM

## 2020-12-06 DIAGNOSIS — N1831 Chronic kidney disease, stage 3a: Secondary | ICD-10-CM | POA: Diagnosis not present

## 2020-12-06 DIAGNOSIS — F01518 Vascular dementia, unspecified severity, with other behavioral disturbance: Secondary | ICD-10-CM

## 2020-12-06 NOTE — Assessment & Plan Note (Signed)
resides in memory care unit [facility], takes Memantine, Donepezil, TSH 2.63 06/05/20  

## 2020-12-06 NOTE — Progress Notes (Signed)
Location:   [facility] Room Number: N103 Place of Service:  SNF (31) Provider:  Branston Halsted Otho Darner, NP    Patient Care Team: [name], MD as PCP - General (Internal Medicine)  Extended Emergency Contact Information Primary Emergency Contact: Bertrum Sol, [facility]) [facility] Address: [address]          [place], [place] [address] [name] of [place] Phone: [phone] Relation: Relative Secondary Emergency Contact: [name] Mobile Phone: [phone] Relation: Relative  Code Status:  DNR Goals of care: Advanced Directive information Advanced Directives 12/07/2020  Does Patient Have a Medical Advance Directive? Yes  Type of Paramedic of [place];Living will;Out of facility DNR (pink MOST or yellow form)  Does patient want to make changes to medical advance directive? No - Patient declined  Copy of [place] in Chart? Yes - validated most recent copy scanned in chart (See row information)  Pre-existing out of facility DNR order (yellow form or pink MOST form) Yellow form placed in chart (order not valid for inpatient use)     Chief Complaint  Patient presents with   Medical Management of Chronic Issues    Routine follow up.    Health Maintenance    Discuss need for td/tdap vaccine, shingles vaccine, PNA vaccine, and DEXA scan.     HPI:  Pt is a 87 y.o. female seen today for medical management of chronic diseases.       Dementia, resides in memory care unit [facility], takes Memantine, Donepezil, TSH 2.63 06/05/20             Anxiety/depression, takes Seroquel 59m tid since 10/31/20, takes Clonazepam qhs, prn Lorazepam-used almost daily, Lexapro 111msince 08/17/20, and Depakote 12588mam since 11/12/20             CKD Bun/creat 14/1.10 eGFR 49 12/04/20     Past Medical History:  Diagnosis Date   Arthritis    Dementia (HCCWebb  Hx of concussion    SenChristianHyperlipidemia    Vitamin  D deficiency, unspecified    Past Surgical History:  Procedure Laterality Date   CHOLECYSTECTOMY      Allergies  Allergen Reactions   Fish-Derived Products    Pravachol [Pravastatin Sodium]     Allergies as of 12/06/2020       Reactions   Fish-derived Products    Pravachol [pravastatin Sodium]         Medication List        Accurate as of December 06, 2020 11:59 PM. If you have any questions, ask your nurse or doctor.          CALCIUM-VITAMIN D PO Take 1 tablet by mouth in the morning and at bedtime. 600m41m,500mg19m400 unit   clonazePAM 0.5 MG tablet Commonly known as: KLONOPIN Take 1 tablet (0.5 mg total) by mouth at bedtime.   divalproex 250 MG DR tablet Commonly known as: DEPAKOTE Take 250 mg by mouth daily.   donepezil 5 MG tablet Commonly known as: ARICEPT Take 5 mg by mouth at bedtime.   escitalopram 10 MG tablet Commonly known as: LEXAPRO Take 10 mg by mouth daily.   LORazepam 0.5 MG tablet Commonly known as: ATIVAN Take 0.5 mg by mouth every 6 (six) hours as needed for anxiety.   memantine 10 MG tablet Commonly known as: NAMENDA Take 10 mg by mouth 2 (two) times daily.   omeprazole 20  MG capsule Commonly known as: PRILOSEC Take 20 mg by mouth daily.   POLYVINYL ALCOHOL-POVIDONE OP Place 2 drops into both eyes 3 (three) times daily. drops; 0.5-0.6 %; amt: 2 drops each eye; ophthalmic (eye)   QUEtiapine 25 MG tablet Commonly known as: SEROQUEL Take 25 mg by mouth 2 (two) times daily.        Review of Systems  Constitutional:  Negative for fatigue, fever and unexpected weight change.  HENT:  Positive for hearing loss. Negative for congestion and voice change.   Eyes:  Negative for visual disturbance.       Dry eyes  Respiratory:  Negative for cough.   Cardiovascular:  Negative for leg swelling.  Gastrointestinal:  Negative for abdominal pain, constipation, nausea and vomiting.       1x after lunch yesterday 11/29/20  Genitourinary:   Negative for dysuria and urgency.  Musculoskeletal:  Positive for arthralgias and gait problem.       Gait has improved, rails or furniture walking sometimes.   Skin:  Negative for color change.  Neurological:  Negative for speech difficulty, weakness and headaches.       Dementia  Psychiatric/Behavioral:  Positive for behavioral problems and confusion. Negative for sleep disturbance. The patient is nervous/anxious.        Improved.    Immunization History  Administered Date(s) Administered   Influenza-Unspecified 02/26/2019, 03/07/2020   Moderna Sars-Covid-2 Vaccination 05/28/2019, 06/25/2019, 04/03/2020, 10/23/2020   Pneumococcal-Unspecified 05/22/2016   Pertinent  Health Maintenance Due  Topic Date Due   DEXA SCAN  Never done   PNA vac Low Risk Adult (2 of 2 - PCV13) 05/22/2017   INFLUENZA VACCINE  12/24/2020   No flowsheet data found. Functional Status Survey:    Vitals:   12/06/20 0903  BP: (!) 146/82  Pulse: 88  Resp: 16  Temp: 98.1 F (36.7 C)  SpO2: 93%  Weight: 147 lb 12.8 oz (67 kg)  Height: '5\' 6"'  (1.676 m)   Body mass index is 23.86 kg/m. Physical Exam Vitals and nursing note reviewed.  Constitutional:      Appearance: Normal appearance.  HENT:     Head: Normocephalic and atraumatic.     Mouth/Throat:     Mouth: Mucous membranes are moist.  Eyes:     Extraocular Movements: Extraocular movements intact.     Conjunctiva/sclera: Conjunctivae normal.     Pupils: Pupils are equal, round, and reactive to light.  Cardiovascular:     Rate and Rhythm: Normal rate and regular rhythm.     Heart sounds: No murmur heard. Pulmonary:     Effort: Pulmonary effort is normal.     Breath sounds: No rales.  Abdominal:     Palpations: Abdomen is soft.     Tenderness: There is no abdominal tenderness.  Musculoskeletal:     Right lower leg: No edema.     Left lower leg: No edema.  Skin:    General: Skin is warm and dry.  Neurological:     General: No focal deficit  present.     Mental Status: She is alert.     Gait: Gait abnormal.     Comments: Oriented to person  Psychiatric:     Comments: Confused, pleasant, conversing.     Labs reviewed: Recent Labs    05/11/20 0532 05/12/20 0525 05/13/20 0516 05/14/20 0440 05/29/20 0000 06/06/20 0000 08/02/20 0000 12/05/20 0000  NA 139 141 139 139   < > 142 141 135*  K 3.7 3.5  3.4* 4.0   < > 4.0 4.8 5.0  CL 103 103 102 104   < > 105 104 99  CO2 '26 27 26 25   ' < > 30* 30* 29*  GLUCOSE 88 87 113* 80  --   --   --   --   BUN '13 13 13 14   ' < > '16 13 14  ' CREATININE 0.96 1.09* 1.11* 1.08*   < > 1.3* 1.0 1.1  CALCIUM 8.8* 8.7* 9.0 9.0   < > 10.0 9.3 9.5  MG 2.2 2.2 2.1  --   --   --   --   --   PHOS 3.9 3.5 3.4  --   --   --   --   --    < > = values in this interval not displayed.   Recent Labs    05/08/20 0446 05/10/20 0511 05/13/20 0516 05/29/20 0000 06/06/20 0000 12/05/20 0000  AST '30 21 18 17 16 ' 11*  ALT 64* 38 '27 12 13 ' 3*  ALKPHOS 152* 141* 112 76 68 71  BILITOT 0.6 0.6 0.5  --   --   --   PROT 5.7* 5.7* 6.5  --   --   --   ALBUMIN 2.4* 2.3* 2.7* 3.7 3.5 3.6   Recent Labs    05/02/20 2225 05/06/20 1937 05/08/20 0704 05/10/20 0511 05/12/20 0525 05/13/20 0516 05/29/20 0000 12/05/20 0000  WBC 7.3 6.9   < > 6.4 7.5 7.3 5.5 6.9  NEUTROABS 6.5 5.3  --   --   --   --   --  4,444.00  HGB 11.4* 11.3*   < > 10.5* 10.9* 11.9* 12.5 12.8  HCT 36.1 35.3*   < > 32.7* 33.9* 36.4 37 39  MCV 93.0 93.1   < > 92.1 92.9 92.9  --   --   PLT 132* 117*   < > 228 242 266 206 229   < > = values in this interval not displayed.   Lab Results  Component Value Date   TSH 5.78 03/28/2020   No results found for: HGBA1C No results found for: CHOL, HDL, LDLCALC, LDLDIRECT, TRIG, CHOLHDL  Significant Diagnostic Results in last 30 days:  No results found.  Assessment/Plan Vascular dementia ([place]) resides in memory care unit [facility], takes Memantine, Donepezil, TSH 2.63 06/05/20  Depression with  anxiety Less emotional/behavioral outbursts, takes Seroquel 96m tid since 10/31/20, takes Clonazepam qhs, prn Lorazepam-used almost daily, Lexapro 151msince 08/17/20, and Depakote 12548mam since 6/20/2  CKD (chronic kidney disease) stage 3, GFR 30-59 ml/min (HCC) Bun/creat 14/1.10 eGFR 49 12/04/20     Family/ staff Communication: plan of care reviewed with the patient and charge nurse.   Labs/tests ordered:  none  Time spend 35 minutes.

## 2020-12-06 NOTE — Assessment & Plan Note (Signed)
Less emotional/behavioral outbursts, takes Seroquel 25mg  tid since 10/31/20, takes Clonazepam qhs, prn Lorazepam-used almost daily, Lexapro 10mg  since 08/17/20, and Depakote 125mg  qam since 6/20/2

## 2020-12-06 NOTE — Assessment & Plan Note (Signed)
Bun/creat 14/1.10 eGFR 49 12/04/20  

## 2020-12-07 ENCOUNTER — Encounter: Payer: Self-pay | Admitting: Nurse Practitioner

## 2020-12-07 NOTE — Progress Notes (Signed)
Location:   [facility] Room Number: N103 Place of Service:  SNF (31) Provider:  Percilla Tweten Johnney Ou, NP    Patient Care Team: [name], MD as PCP - General (Internal Medicine)  Extended Emergency Contact Information Primary Emergency Contact: Ramiro Harvest, [facility]) [facility] Address: [address]          [place], [place] [address] [name] of [place] Phone: [phone] Relation: Relative Secondary Emergency Contact: Marlowe Kays Mobile Phone: [phone] Relation: Relative  Code Status:  DNR Goals of care: Advanced Directive information Advanced Directives 12/07/2020  Does Patient Have a Medical Advance Directive? Yes  Type of Estate agent of [place];Living will;Out of facility DNR (pink MOST or yellow form)  Does patient want to make changes to medical advance directive? No - Patient declined  Copy of Healthcare Power of Attorney in Chart? Yes - validated most recent copy scanned in chart (See row information)  Pre-existing out of facility DNR order (yellow form or pink MOST form) Yellow form placed in chart (order not valid for inpatient use)     Chief Complaint  Patient presents with   Medical Management of Chronic Issues    Routine follow up.   Health Maintenance    Discuss the need for td/tdap vaccine, shingles vaccine, PNA vaccine, and DEXA scan.    HPI:  Pt is a 87 y.o. female seen today for medical management of chronic diseases.     Past Medical History:  Diagnosis Date   Arthritis    Dementia (HCC)    Hx of concussion    [facility]   Hyperlipidemia    Vitamin D deficiency, unspecified    Past Surgical History:  Procedure Laterality Date   CHOLECYSTECTOMY      Allergies  Allergen Reactions   Fish-Derived Products    Pravachol [Pravastatin Sodium]     Allergies as of 12/07/2020       Reactions   Fish-derived Products    Pravachol [pravastatin Sodium]         Medication  List        Accurate as of December 07, 2020  8:42 AM. If you have any questions, ask your nurse or doctor.          CALCIUM-VITAMIN D PO Take 1 tablet by mouth in the morning and at bedtime. 600mg  (1,500mg ) - 400 unit   clonazePAM 0.5 MG tablet Commonly known as: KLONOPIN Take 1 tablet (0.5 mg total) by mouth at bedtime.   divalproex 250 MG DR tablet Commonly known as: DEPAKOTE Take 250 mg by mouth daily.   donepezil 5 MG tablet Commonly known as: ARICEPT Take 5 mg by mouth at bedtime.   escitalopram 10 MG tablet Commonly known as: LEXAPRO Take 10 mg by mouth daily.   LORazepam 0.5 MG tablet Commonly known as: ATIVAN Take 0.5 mg by mouth every 6 (six) hours as needed for anxiety.   memantine 10 MG tablet Commonly known as: NAMENDA Take 10 mg by mouth 2 (two) times daily.   omeprazole 20 MG capsule Commonly known as: PRILOSEC Take 20 mg by mouth daily.   POLYVINYL ALCOHOL-POVIDONE OP Place 2 drops into both eyes 3 (three) times daily. drops; 0.5-0.6 %; amt: 2 drops each eye; ophthalmic (eye)   QUEtiapine 25 MG tablet Commonly known as: SEROQUEL Take 25 mg by mouth 2 (two) times daily.        Review of Systems  Immunization History  Administered Date(s) Administered  Influenza-Unspecified 02/26/2019, 03/07/2020   Moderna Sars-Covid-2 Vaccination 05/28/2019, 06/25/2019, 04/03/2020, 10/23/2020   Pneumococcal-Unspecified 05/22/2016   Pertinent  Health Maintenance Due  Topic Date Due   DEXA SCAN  Never done   PNA vac Low Risk Adult (2 of 2 - PCV13) 05/22/2017   INFLUENZA VACCINE  12/24/2020   No flowsheet data found. Functional Status Survey:    Vitals:   12/07/20 0835  BP: (!) 146/82  Pulse: 88  Resp: 16  Temp: 98.2 F (36.8 C)  SpO2: 95%  Weight: 147 lb 12.8 oz (67 kg)  Height: 5\' 6"  (1.676 m)   Body mass index is 23.86 kg/m. Physical Exam  Labs reviewed: Recent Labs    05/11/20 0532 05/12/20 0525 05/13/20 0516 05/14/20 0440  05/29/20 0000 06/06/20 0000 08/02/20 0000 12/05/20 0000  NA 139 141 139 139   < > 142 141 135*  K 3.7 3.5 3.4* 4.0   < > 4.0 4.8 5.0  CL 103 103 102 104   < > 105 104 99  CO2 26 27 26 25    < > 30* 30* 29*  GLUCOSE 88 87 113* 80  --   --   --   --   BUN 13 13 13 14    < > 16 13 14   CREATININE 0.96 1.09* 1.11* 1.08*   < > 1.3* 1.0 1.1  CALCIUM 8.8* 8.7* 9.0 9.0   < > 10.0 9.3 9.5  MG 2.2 2.2 2.1  --   --   --   --   --   PHOS 3.9 3.5 3.4  --   --   --   --   --    < > = values in this interval not displayed.   Recent Labs    05/08/20 0446 05/10/20 0511 05/13/20 0516 05/29/20 0000 06/06/20 0000 12/05/20 0000  AST 30 21 18 17 16  11*  ALT 64* 38 27 12 13  3*  ALKPHOS 152* 141* 112 76 68 71  BILITOT 0.6 0.6 0.5  --   --   --   PROT 5.7* 5.7* 6.5  --   --   --   ALBUMIN 2.4* 2.3* 2.7* 3.7 3.5 3.6   Recent Labs    05/02/20 2225 05/06/20 1937 05/08/20 0704 05/10/20 0511 05/12/20 0525 05/13/20 0516 05/29/20 0000 12/05/20 0000  WBC 7.3 6.9   < > 6.4 7.5 7.3 5.5 6.9  NEUTROABS 6.5 5.3  --   --   --   --   --  4,444.00  HGB 11.4* 11.3*   < > 10.5* 10.9* 11.9* 12.5 12.8  HCT 36.1 35.3*   < > 32.7* 33.9* 36.4 37 39  MCV 93.0 93.1   < > 92.1 92.9 92.9  --   --   PLT 132* 117*   < > 228 242 266 206 229   < > = values in this interval not displayed.   Lab Results  Component Value Date   TSH 5.78 03/28/2020   No results found for: HGBA1C No results found for: CHOL, HDL, LDLCALC, LDLDIRECT, TRIG, CHOLHDL  Significant Diagnostic Results in last 30 days:  No results found.  Assessment/Plan There are no diagnoses linked to this encounter.   Family/ staff Communication:   Labs/tests ordered:     This encounter was created in error - please disregard.

## 2020-12-11 IMAGING — CT CT HEAD W/O CM
5 of 8 series · 16 of 47 positions shown, 17 images · non-contrast
Comparison: None.

CLINICAL DATA: Status post fall.

EXAM:
CT HEAD WITHOUT CONTRAST
CT CERVICAL SPINE WITHOUT CONTRAST
TECHNIQUE: Multidetector CT imaging of the head and cervical spine was
performed following the standard protocol without intravenous
contrast. Multiplanar CT image reconstructions of the cervical spine
were also generated.

[Series 3: head without · axial · non-contrast · 0.45mm/px · z∈[-69,+91]mm · 3 of 33 slices shown, 4 images]
[im 1/33  brain]
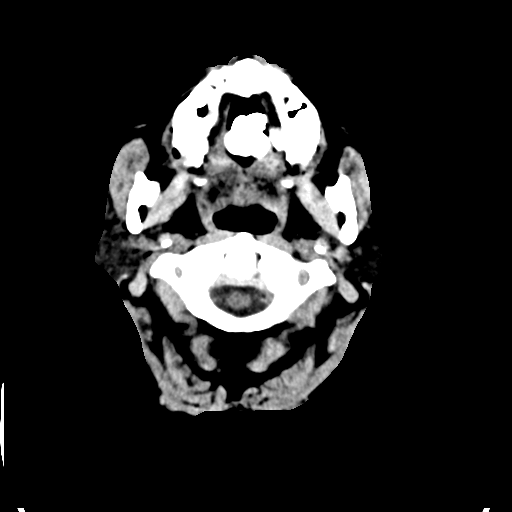
[im 1/33  bone]
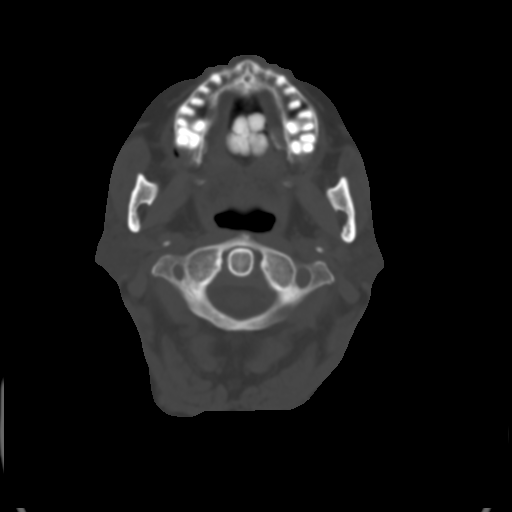
[im 17/33  brain]
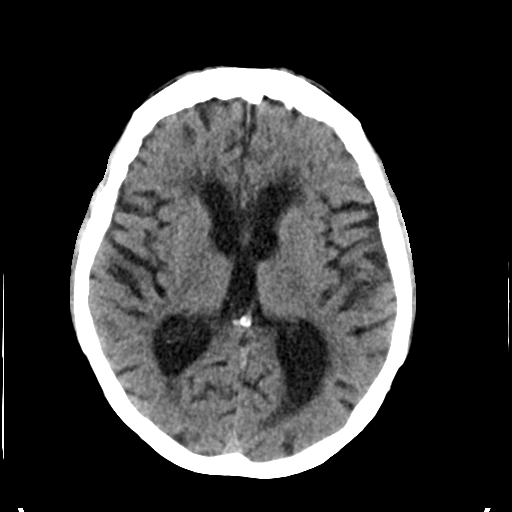
[im 33/33  brain]
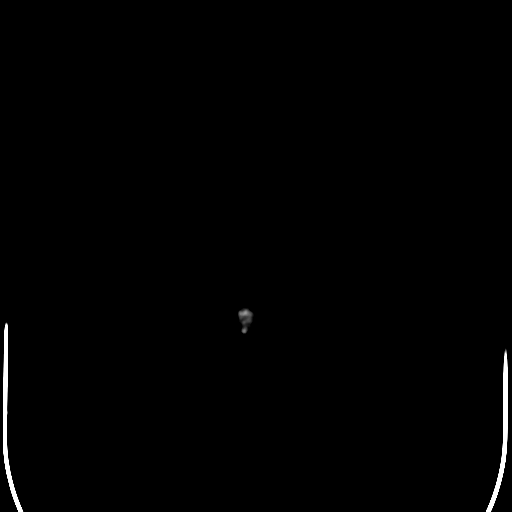

[Series 4: head bone · axial · 0.45mm/px · z∈[-47,+69]mm · 6 of 82 slices shown]
[im 12/82  bone]
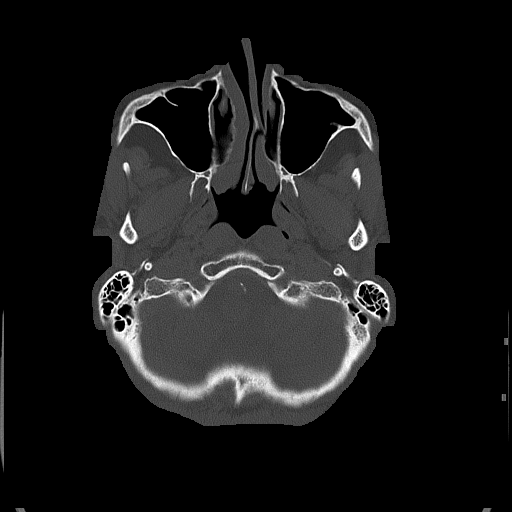
[im 24/82  bone]
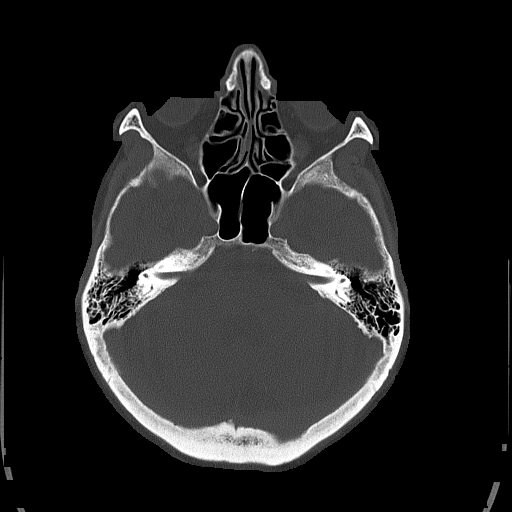
[im 35/82  bone]
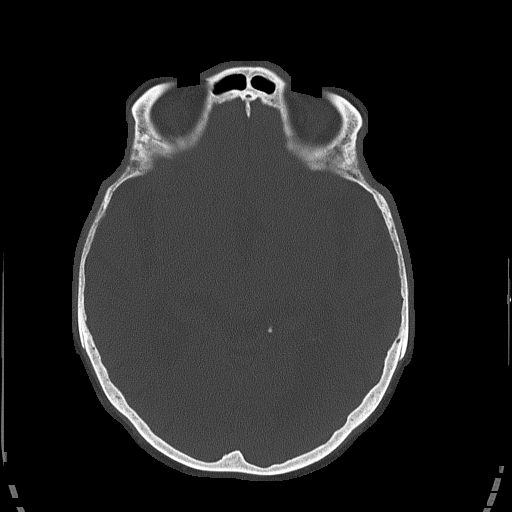
[im 47/82  bone]
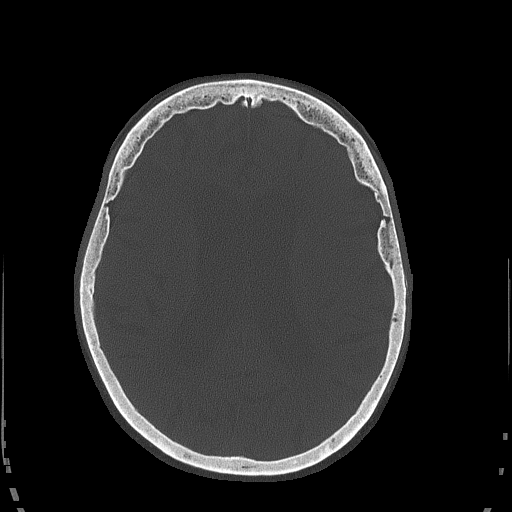
[im 58/82  bone]
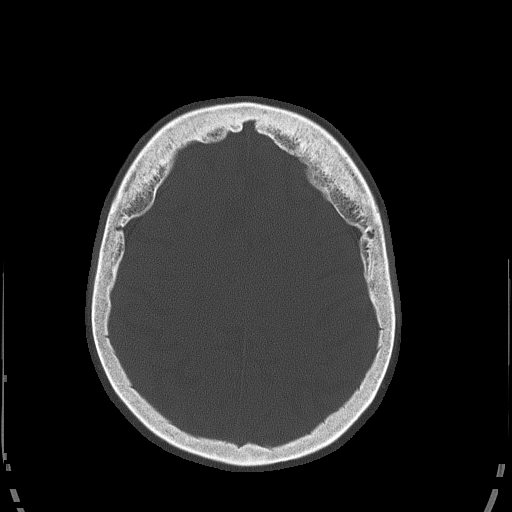
[im 70/82  bone]
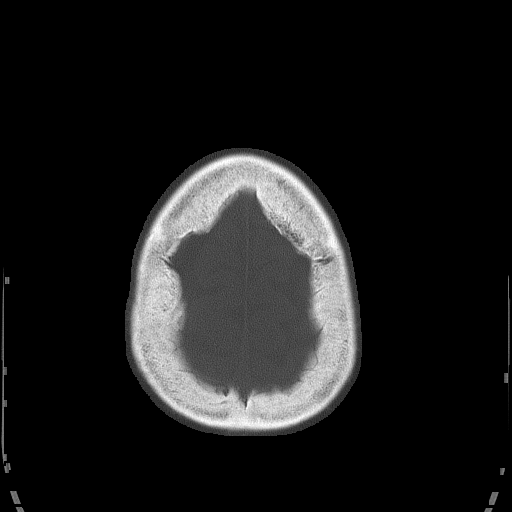

[Series 5: head without cor · coronal · non-contrast · 0.34mm/px · 3 of 67 slices shown]
[im 25/67  brain]
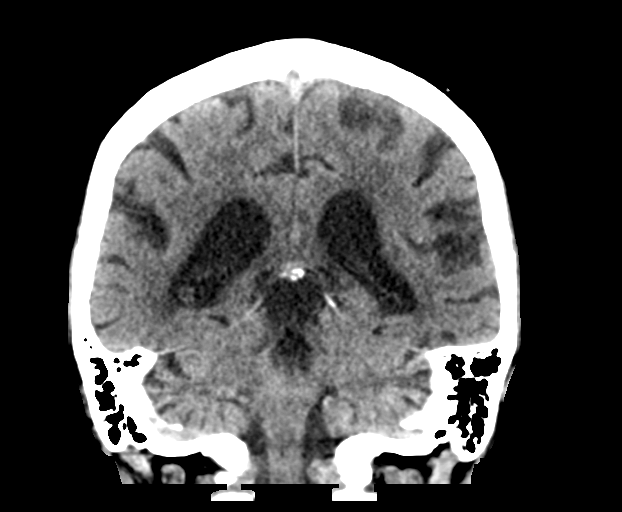
[im 34/67  brain]
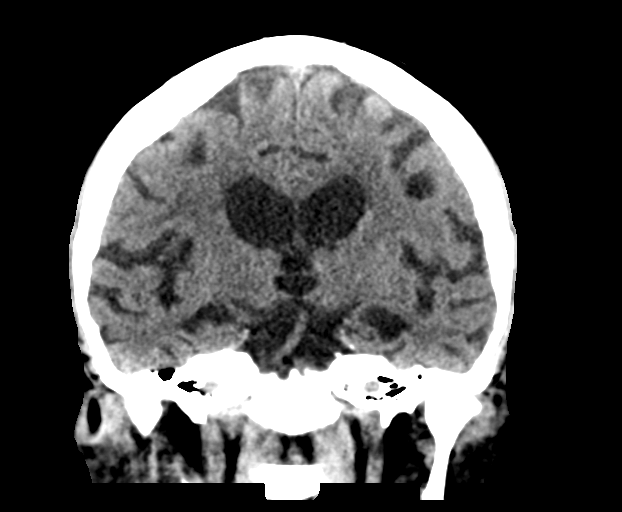
[im 42/67  brain]
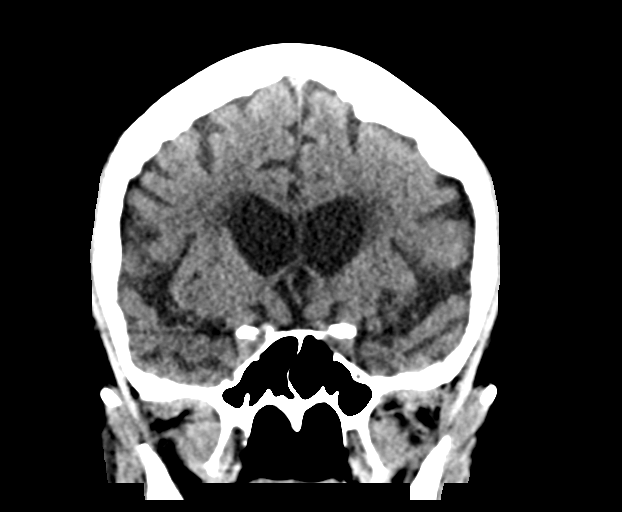

[Series 6: head without sag · sagittal · non-contrast · 0.32mm/px · 2 of 67 slices shown]
[im 23/67  brain]
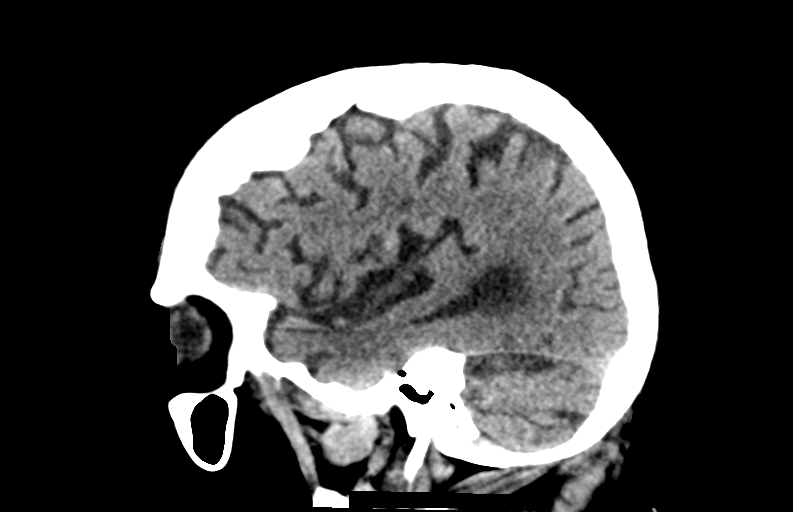
[im 45/67  brain]
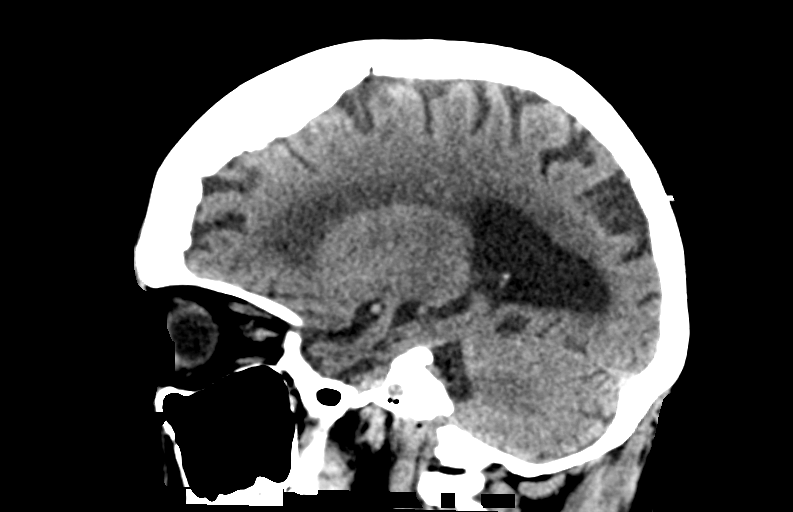

[Series 11: c_spine 2.0 orthogonals · axial · 0.21mm/px · z∈[-235,-220]mm · 2 of 98 slices shown]
[im 11/98  brain]
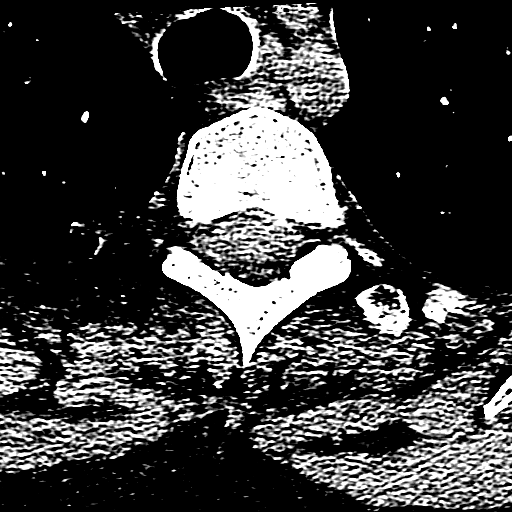
[im 22/98  brain]
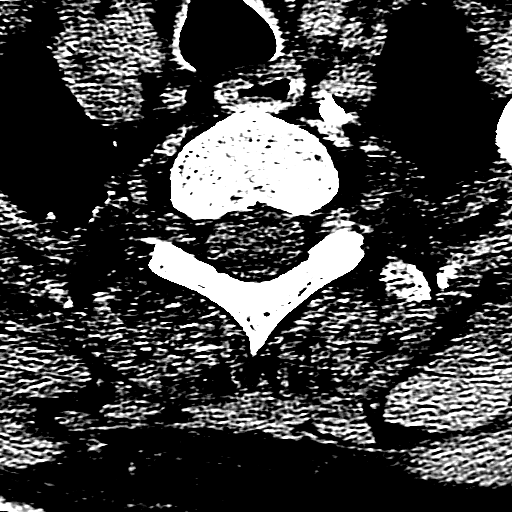

[16 of 47 positions shown; findings below may reference images not displayed]

FINDINGS: CT HEAD FINDINGS

Brain: There is moderate severity cerebral atrophy with widening of
the extra-axial spaces and ventricular dilatation.
There are areas of decreased attenuation within the white matter
tracts of the supratentorial brain, consistent with microvascular
disease changes.

Vascular: No hyperdense vessel or unexpected calcification.

Skull: Normal. Negative for fracture or focal lesion.

Sinuses/Orbits: No acute finding.

Other: None.

CT CERVICAL SPINE FINDINGS

Alignment: Normal.

Skull base and vertebrae: No acute fracture. No primary bone lesion
or focal pathologic process.

Soft tissues and spinal canal: No prevertebral fluid or swelling. No
visible canal hematoma.

Disc levels: Marked severity endplate sclerosis is seen at the
levels of C4-C5 and C5-C6, with mild anterior osteophyte formation
noted at the levels of C2-C3 C6-C7 and C7-T1.

Marked severity intervertebral disc space narrowing is seen at the
levels of C4-C5 and C5-C6.

Marked severity bilateral multilevel facet joint hypertrophy is
noted.

Upper chest: Mild biapical scarring and/or atelectasis is seen.

Other: N/A
IMPRESSION: 1. Moderate severity cerebral atrophy and microvascular disease
changes of the supratentorial brain.
2. Marked severity degenerative changes of the cervical spine
without evidence of an acute fracture or subluxation.

## 2020-12-18 ENCOUNTER — Other Ambulatory Visit: Payer: Self-pay

## 2020-12-18 MED ORDER — CLONAZEPAM 0.5 MG PO TABS: 0.5000 mg | ORAL_TABLET | Freq: Every day | ORAL | 0 refills | Status: DC

## 2020-12-18 NOTE — Telephone Encounter (Signed)
[facility] has faxed over paper requesting refill on medication "Clonazepam 0.5mg ". Patient last refill was 11/14/2020 with 30 tablets to be taken by mouth at bedtime. Medication pend and sent to Mast, Man, NP for approval. Please Advise.

## 2020-12-25 ENCOUNTER — Non-Acute Institutional Stay (SKILLED_NURSING_FACILITY): Payer: Medicare Other | Admitting: Internal Medicine

## 2020-12-25 ENCOUNTER — Encounter: Payer: Self-pay | Admitting: Internal Medicine

## 2020-12-25 DIAGNOSIS — F418 Other specified anxiety disorders: Secondary | ICD-10-CM | POA: Diagnosis not present

## 2020-12-25 DIAGNOSIS — G301 Alzheimer's disease with late onset: Secondary | ICD-10-CM

## 2020-12-25 DIAGNOSIS — F0281 Dementia in other diseases classified elsewhere with behavioral disturbance: Secondary | ICD-10-CM | POA: Diagnosis not present

## 2020-12-25 DIAGNOSIS — N1831 Chronic kidney disease, stage 3a: Secondary | ICD-10-CM

## 2020-12-25 DIAGNOSIS — F02818 Dementia in other diseases classified elsewhere, unspecified severity, with other behavioral disturbance: Secondary | ICD-10-CM

## 2020-12-25 NOTE — Progress Notes (Signed)
Location:   [facility] Room Number: 103 Place of Service:  SNF [phone]) Provider:  Einar Crow MD  [name], MD  Patient Care Team: [name], MD as PCP - General (Internal Medicine)  Extended Emergency Contact Information Primary Emergency Contact: Ramiro Harvest, [facility]) [facility] Address: [address]          [place], [place] [address] [name] Phone: [phone] Relation: Relative Secondary Emergency Contact: Marlowe Kays Mobile Phone: [phone] Relation: Relative  Code Status:  DNR Goals of care: Advanced Directive information Advanced Directives 12/25/2020  Does Patient Have a Medical Advance Directive? Yes  Type of Estate agent of [place];Living will;Out of facility DNR (pink MOST or yellow form)  Does patient want to make changes to medical advance directive? No - Patient declined  Copy of Healthcare Power of Attorney in Chart? Yes - validated most recent copy scanned in chart (See row information)  Pre-existing out of facility DNR order (yellow form or pink MOST form) Yellow form placed in chart (order not valid for inpatient use)     Chief Complaint  Patient presents with   Medical Management of Chronic Issues   Health Maintenance    TDAP, Shingrix, Dexa scan, PCV13    HPI:  Pt is a 87 y.o. female seen today for medical management of chronic diseases.    Patient has Dementia and Lives in Memory Unit   Also was Admitted to the hospital from 12/12-12/20 for Intraabdominal Abscess/ Liver abscess IR was consulted. Underwent Liver Abscess Aspiration on 05/07/20 Drain was placed. Abscess Grew Pseudomonas Was treated with Zosyn for 7 days.Infectious disease no further antibiotics was necessary repeat CT scan done after the removal of drain showed resolution of abscess.  Has h/o Dementia CT scan Moderate severity cerebral atrophy and microvascular disease   Doing well with her behaviors No  Acute issues Per nurses she refuses her shower. Has been issue for past few months Cannot convince her to get in shower No Falls Walks with her walker Weight is good  Past Medical History:  Diagnosis Date   Arthritis    Dementia (HCC)    Hx of concussion    [facility]   Hyperlipidemia    Vitamin D deficiency, unspecified    Past Surgical History:  Procedure Laterality Date   CHOLECYSTECTOMY      Allergies  Allergen Reactions   Fish-Derived Products    Pravachol [Pravastatin Sodium]     Allergies as of 12/25/2020       Reactions   Fish-derived Products    Pravachol [pravastatin Sodium]         Medication List        Accurate as of December 25, 2020 11:51 AM. If you have any questions, ask your nurse or doctor.          STOP taking these medications    LORazepam 0.5 MG tablet Commonly known as: ATIVAN Stopped by: [name], MD       TAKE these medications    CALCIUM-VITAMIN D PO Take 1 tablet by mouth in the morning and at bedtime. 600mg  (1,500mg ) - 400 unit   clonazePAM 0.5 MG tablet Commonly known as: KLONOPIN Take 1 tablet (0.5 mg total) by mouth at bedtime.   divalproex 250 MG DR tablet Commonly known as: DEPAKOTE Take 250 mg by mouth daily.   donepezil 5 MG tablet Commonly known as: ARICEPT Take 5 mg by mouth 2 (  two) times daily.   escitalopram 10 MG tablet Commonly known as: LEXAPRO Take 10 mg by mouth daily.   memantine 10 MG tablet Commonly known as: NAMENDA Take 10 mg by mouth 2 (two) times daily.   omeprazole 20 MG capsule Commonly known as: PRILOSEC Take 20 mg by mouth daily.   POLYVINYL ALCOHOL-POVIDONE OP Place 2 drops into both eyes 3 (three) times daily. drops; 0.5-0.6 %; amt: 2 drops each eye; ophthalmic (eye)   QUEtiapine 25 MG tablet Commonly known as: SEROQUEL Take 25 mg by mouth 3 (three) times daily.        Review of Systems  Unable to perform ROS: Dementia   Immunization History   Administered Date(s) Administered   Influenza-Unspecified 02/26/2019, 03/07/2020   Moderna Sars-Covid-2 Vaccination 05/28/2019, 06/25/2019, 04/03/2020, 10/23/2020   Pneumococcal-Unspecified 05/22/2016   Pertinent  Health Maintenance Due  Topic Date Due   DEXA SCAN  Never done   PNA vac Low Risk Adult (2 of 2 - PCV13) 05/22/2017   INFLUENZA VACCINE  12/24/2020   No flowsheet data found. Functional Status Survey:    Vitals:   12/25/20 1140  BP: 112/66  Pulse: 72  Resp: 17  Temp: 98.7 F (37.1 C)  SpO2: 94%  Weight: 154 lb (69.9 kg)  Height: 5\' 6"  (1.676 m)   Body mass index is 24.86 kg/m. Physical Exam Constitutional:  Well-developed and well-nourished.  HENT:  Head: Normocephalic.  Mouth/Throat: Oropharynx is clear and moist.  Eyes: Pupils are equal, round, and reactive to light.  Neck: Neck supple.  Cardiovascular: Normal rate and normal heart sounds.  No murmur heard. Pulmonary/Chest: Effort normal and breath sounds normal. No respiratory distress. No wheezes. She has no rales.  Abdominal: Soft. Bowel sounds are normal. No distension. There is no tenderness. There is no rebound.  Musculoskeletal: No edema.  Lymphadenopathy: none Neurological: Alert Was telling me how she has meeting today in [place].  N oDeficits Skin: Skin is warm and dry.  Psychiatric: Normal mood and affect. Behavior is normal. Thought content normal.   Labs reviewed: Recent Labs    05/11/20 0532 05/12/20 0525 05/13/20 0516 05/14/20 0440 05/29/20 0000 06/06/20 0000 08/02/20 0000 12/05/20 0000  NA 139 141 139 139   < > 142 141 135*  K 3.7 3.5 3.4* 4.0   < > 4.0 4.8 5.0  CL 103 103 102 104   < > 105 104 99  CO2 26 27 26 25    < > 30* 30* 29*  GLUCOSE 88 87 113* 80  --   --   --   --   BUN 13 13 13 14    < > 16 13 14   CREATININE 0.96 1.09* 1.11* 1.08*   < > 1.3* 1.0 1.1  CALCIUM 8.8* 8.7* 9.0 9.0   < > 10.0 9.3 9.5  MG 2.2 2.2 2.1  --   --   --   --   --   PHOS 3.9 3.5 3.4  --   --    --   --   --    < > = values in this interval not displayed.   Recent Labs    05/08/20 0446 05/10/20 0511 05/13/20 0516 05/29/20 0000 06/06/20 0000 12/05/20 0000  AST 30 21 18 17 16  11*  ALT 64* 38 27 12 13  3*  ALKPHOS 152* 141* 112 76 68 71  BILITOT 0.6 0.6 0.5  --   --   --   PROT 5.7* 5.7* 6.5  --   --   --  ALBUMIN 2.4* 2.3* 2.7* 3.7 3.5 3.6   Recent Labs    05/02/20 2225 05/06/20 1937 05/08/20 0704 05/10/20 0511 05/12/20 0525 05/13/20 0516 05/29/20 0000 12/05/20 0000  WBC 7.3 6.9   < > 6.4 7.5 7.3 5.5 6.9  NEUTROABS 6.5 5.3  --   --   --   --   --  4,444.00  HGB 11.4* 11.3*   < > 10.5* 10.9* 11.9* 12.5 12.8  HCT 36.1 35.3*   < > 32.7* 33.9* 36.4 37 39  MCV 93.0 93.1   < > 92.1 92.9 92.9  --   --   PLT 132* 117*   < > 228 242 266 206 229   < > = values in this interval not displayed.   Lab Results  Component Value Date   TSH 5.78 03/28/2020   No results found for: HGBA1C No results found for: CHOL, HDL, LDLCALC, LDLDIRECT, TRIG, CHOLHDL  Significant Diagnostic Results in last 30 days:  No results found.  Assessment/Plan Late onset Alzheimer's dementia with behavioral disturbance (HCC) Doing well on Namenda Aricept Also On Depakote Being careful with dosing due to her h/o Liver abscess Seroquel was incrased to TID Depression with anxiety On Lexapro and Klonipin Will use o.25 mg Ativan Q weekly to see if helps with her shower Stage 3a chronic kidney disease (HCC) Creat stable   Family/ staff Communication:   Labs/tests ordered:

## 2021-01-13 ENCOUNTER — Emergency Department (HOSPITAL_COMMUNITY)
Admission: EM | Admit: 2021-01-13 | Discharge: 2021-01-13 | Disposition: A | Discharge status: Home / Self Care | Payer: Medicare Other | Attending: Emergency Medicine | Admitting: Emergency Medicine

## 2021-01-13 ENCOUNTER — Encounter (HOSPITAL_COMMUNITY): Payer: Self-pay | Admitting: Emergency Medicine

## 2021-01-13 ENCOUNTER — Emergency Department (HOSPITAL_COMMUNITY): Payer: Medicare Other

## 2021-01-13 ENCOUNTER — Other Ambulatory Visit: Payer: Self-pay

## 2021-01-13 DIAGNOSIS — N183 Chronic kidney disease, stage 3 unspecified: Secondary | ICD-10-CM | POA: Diagnosis not present

## 2021-01-13 DIAGNOSIS — Y92129 Unspecified place in nursing home as the place of occurrence of the external cause: Secondary | ICD-10-CM | POA: Diagnosis not present

## 2021-01-13 DIAGNOSIS — S0101XA Laceration without foreign body of scalp, initial encounter: Secondary | ICD-10-CM | POA: Diagnosis not present

## 2021-01-13 DIAGNOSIS — W01198A Fall on same level from slipping, tripping and stumbling with subsequent striking against other object, initial encounter: Secondary | ICD-10-CM | POA: Insufficient documentation

## 2021-01-13 DIAGNOSIS — Z23 Encounter for immunization: Secondary | ICD-10-CM | POA: Diagnosis not present

## 2021-01-13 DIAGNOSIS — W010XXA Fall on same level from slipping, tripping and stumbling without subsequent striking against object, initial encounter: Secondary | ICD-10-CM

## 2021-01-13 DIAGNOSIS — F0390 Unspecified dementia without behavioral disturbance: Secondary | ICD-10-CM | POA: Insufficient documentation

## 2021-01-13 DIAGNOSIS — S0003XA Contusion of scalp, initial encounter: Secondary | ICD-10-CM

## 2021-01-13 MED ORDER — TETANUS-DIPHTH-ACELL PERTUSSIS 5-2.5-18.5 LF-MCG/0.5 IM SUSY
0.5000 mL | PREFILLED_SYRINGE | Freq: Once | INTRAMUSCULAR | Status: AC
  Administered 2021-01-13: 0.5 mL via INTRAMUSCULAR
  Filled 2021-01-13: qty 0.5

## 2021-01-13 MED ORDER — LIDOCAINE-EPINEPHRINE (PF) 2 %-1:200000 IJ SOLN
20.0000 mL | Freq: Once | INTRAMUSCULAR | Status: AC
  Administered 2021-01-13: 20 mL
  Filled 2021-01-13: qty 20

## 2021-01-13 NOTE — Discharge Instructions (Addendum)
It was our pleasure to provide your ER care today - we hope that you feel better.  Keep wound area very clean. Have staples removed, your doctor or urgent care, in one week.   Fall precautions.   Return to ER if worse, new symptoms, new/severe pain, infection of wound, or other concern.

## 2021-01-13 NOTE — ED Notes (Signed)
POA Margaret updated that patient is leaving with PTAR to return to her facility.

## 2021-01-13 NOTE — ED Triage Notes (Signed)
BIBA Per EMS: Pt coming from [facility] with a fall that occurred today. Pt uses walker at home ; pt hit crown of head on door. Lac on head. No decreased LOC. Denies blood thinners.  132/68 78 HR  16 RR  98% RA 111 CBG  Hx dementia

## 2021-01-13 NOTE — ED Provider Notes (Signed)
[facility]-EMERGENCY DEPT Provider Note   CSN: [id number] Arrival date & time: 01/13/21  1830     History Chief Complaint  Patient presents with   Fall   Head Laceration    Sarah Larsen is a 87 y.o. female.  Patient s/p fall just pta today. Normally uses walker, got feet tangled, fell forward and hit top of head on edge of door. 3 cm laceration to scalp. Symptoms acute onset, moderate, episodic. No loc. No headache. No neck or back pain. Denies any other pain or injury. Last tetanus unknown. Pt denies any faintness or dizziness prior to fall. No nv. No numbness/weakness. No anticoag use.   The history is provided by the patient and the EMS personnel.  Fall Pertinent negatives include no chest pain, no abdominal pain, no headaches and no shortness of breath.  Head Laceration Pertinent negatives include no chest pain, no abdominal pain, no headaches and no shortness of breath.      Past Medical History:  Diagnosis Date   Arthritis    Dementia (HCC)    Hx of concussion    [facility]   Hyperlipidemia    Vitamin D deficiency, unspecified     Patient Active Problem List   Diagnosis Date Noted   Nausea & vomiting 11/30/2020   Hypokalemia 05/30/2020   CKD (chronic kidney disease) stage 3, GFR 30-59 ml/min (HCC) 05/30/2020   Nasal bone fracture 05/23/2020   Nondisplaced fracture of distal phalanx of right lesser toe(s), subsequent encounter for fracture with routine healing 05/23/2020   Intraabdominal fluid collection 05/07/2020   Acute renal injury (HCC) 05/03/2020   Generalized weakness 05/03/2020   UTI (urinary tract infection) 05/03/2020   Elevated TSH 03/28/2020   Weight gain 03/22/2020   Hyponatremia 01/18/2020   Depression with anxiety 01/05/2020   Fall 12/28/2019   Dry eyes 11/09/2019   Dizziness 08/24/2019   Agitation 08/24/2019   Vascular dementia (HCC) 01/15/2019   Hyperlipidemia 01/15/2019   Vitamin D insufficiency  01/15/2019   Unsteady gait 01/15/2019    Past Surgical History:  Procedure Laterality Date   CHOLECYSTECTOMY       OB History   No obstetric history on file.     Family History  Family history unknown: Yes    Social History   Tobacco Use   Smoking status: Never   Smokeless tobacco: Never  Vaping Use   Vaping Use: Never used  Substance Use Topics   Alcohol use: Never   Drug use: Never    Home Medications Prior to Admission medications   Medication Sig Start Date End Date Taking? Authorizing Provider  CALCIUM-VITAMIN D PO Take 1 tablet by mouth in the morning and at bedtime. 600mg  (1,500mg ) - 400 unit    [provider]  clonazePAM (KLONOPIN) 0.5 MG tablet Take 1 tablet (0.5 mg total) by mouth at bedtime. 12/18/20   Mast, Man X, NP  divalproex (DEPAKOTE) 250 MG DR tablet Take 250 mg by mouth daily.    [provider]  donepezil (ARICEPT) 5 MG tablet Take 5 mg by mouth 2 (two) times daily.    [provider]  escitalopram (LEXAPRO) 10 MG tablet Take 10 mg by mouth daily.    [provider]  memantine (NAMENDA) 10 MG tablet Take 10 mg by mouth 2 (two) times daily.    [provider]  omeprazole (PRILOSEC) 20 MG capsule Take 20 mg by mouth daily.    [provider]  POLYVINYL  ALCOHOL-POVIDONE OP Place 2 drops into both eyes 3 (three) times daily. drops; 0.5-0.6 %; amt: 2 drops each eye; ophthalmic (eye)    [provider]  QUEtiapine (SEROQUEL) 25 MG tablet Take 25 mg by mouth 3 (three) times daily.    [provider]    Allergies    Fish-derived products and Pravachol [pravastatin sodium]  Review of Systems   Review of Systems  Constitutional:  Negative for fever.  HENT:  Negative for nosebleeds.   Eyes:  Negative for pain and visual disturbance.  Respiratory:  Negative for shortness of breath.   Cardiovascular:  Negative for chest pain.  Gastrointestinal:  Negative for abdominal pain, nausea and  vomiting.  Genitourinary:  Negative for flank pain.  Musculoskeletal:  Negative for back pain and neck pain.  Skin:  Positive for wound.  Neurological:  Negative for weakness, numbness and headaches.  Hematological:  Does not bruise/bleed easily.  Psychiatric/Behavioral:         Mental status reported as c/w baseline.    Physical Exam Updated Vital Signs BP (!) 142/75   Pulse 78   Temp (!) 97.5 F (36.4 C) (Oral)   Resp 17   SpO2 97%   Physical Exam Vitals and nursing note reviewed.  Constitutional:      Appearance: Normal appearance. She is well-developed.  HENT:     Head:     Comments: 3 cm superior scalp laceration.     Nose: Nose normal.     Mouth/Throat:     Mouth: Mucous membranes are moist.  Eyes:     General: No scleral icterus.    Conjunctiva/sclera: Conjunctivae normal.     Pupils: Pupils are equal, round, and reactive to light.  Neck:     Trachea: No tracheal deviation.  Cardiovascular:     Rate and Rhythm: Normal rate and regular rhythm.     Pulses: Normal pulses.     Heart sounds: Normal heart sounds. No murmur heard.   No friction rub. No gallop.  Pulmonary:     Effort: Pulmonary effort is normal. No respiratory distress.     Breath sounds: Normal breath sounds.  Chest:     Chest wall: No tenderness.  Abdominal:     General: Bowel sounds are normal. There is no distension.     Palpations: Abdomen is soft.     Tenderness: There is no abdominal tenderness. There is no guarding.  Genitourinary:    Comments: No cva tenderness.  Musculoskeletal:        General: No swelling.     Cervical back: Normal range of motion and neck supple. No rigidity or tenderness. No muscular tenderness.     Comments: CTLS spine, non tender, aligned, no step off. Good rom bilateral extremities without pain or focal bony tenderness.   Skin:    General: Skin is warm and dry.     Findings: No rash.  Neurological:     Mental Status: She is alert.     Comments: Alert, speech  normal. GCS 15. Motor/sens grossly intact bil.   Psychiatric:        Mood and Affect: Mood normal.    ED Results / Procedures / Treatments   Labs (all labs ordered are listed, but only abnormal results are displayed) Labs Reviewed - No data to display  EKG None  Radiology CT Head Wo Contrast  Result Date: 01/13/2021 CLINICAL DATA:  Fall, head injury, scalp laceration EXAM: CT HEAD WITHOUT CONTRAST TECHNIQUE: Contiguous axial images  were obtained from the base of the skull through the vertex without intravenous contrast. COMPARISON:  05/22/2020 FINDINGS: Brain: Normal anatomic configuration. Parenchymal volume loss is commensurate with the patient's age. Mild periventricular white matter changes are present likely reflecting the sequela of small vessel ischemia. No abnormal intra or extra-axial mass lesion or fluid collection. No abnormal mass effect or midline shift. No evidence of acute intracranial hemorrhage or infarct. Borderline ventriculomegaly likely reflects the sequela of central atrophy and is stable since prior examination. Cerebellum unremarkable. Vascular: No asymmetric hyperdense vasculature at the skull base. Skull: Intact Sinuses/Orbits: Paranasal sinuses are clear. Orbits are unremarkable. Other: Mastoid air cells and middle ear cavities are clear. Skin staples are seen at the left parietal vertex. IMPRESSION: No acute intracranial abnormality.  No calvarial fracture. Stable senescent change. Electronically Signed   By: Helyn Numbers M.D.   On: 01/13/2021 19:30    Procedures .Marland KitchenLaceration Repair  Date/Time: 01/13/2021 7:13 PM Performed by: Cathren Laine, MD Authorized by: Cathren Laine, MD   Consent:    Consent given by:  Patient Laceration details:    Location:  Scalp   Scalp location:  Crown   Length (cm):  3 Pre-procedure details:    Preparation:  Patient was prepped and draped in usual sterile fashion and imaging obtained to evaluate for foreign  bodies Exploration:    Imaging outcome: foreign body not noted     Wound exploration: entire depth of wound visualized     Contaminated: no   Treatment:    Area cleansed with:  Saline   Amount of cleaning:  Standard   Irrigation solution:  Sterile saline   Visualized foreign bodies/material removed: no   Skin repair:    Repair method:  Staples   Number of staples:  6 Approximation:    Approximation:  Close Repair type:    Repair type:  Simple Post-procedure details:    Dressing:  Antibiotic ointment   Procedure completion:  Tolerated well, no immediate complications   Medications Ordered in ED Medications  lidocaine-EPINEPHrine (XYLOCAINE W/EPI) 2 %-1:200000 (PF) injection 20 mL (20 mLs Infiltration Given 01/13/21 1856)  Tdap (BOOSTRIX) injection 0.5 mL (0.5 mLs Intramuscular Given 01/13/21 1854)    ED Course  I have reviewed the triage vital signs and the nursing notes.  Pertinent labs & imaging results that were available during my care of the patient were reviewed by me and considered in my medical decision making (see chart for details).    MDM Rules/Calculators/A&P                           CT imaging ordered.   Tetanus im.   Reviewed nursing notes and prior charts for additional history.   Wound sutured.   CT reviewed/interpreted by me - no hem.   Pt appears stable for d/c.    Final Clinical Impression(s) / ED Diagnoses Final diagnoses:  None    Rx / DC Orders ED Discharge Orders     None        Cathren Laine, MD 01/13/21 1943

## 2021-01-14 ENCOUNTER — Non-Acute Institutional Stay (SKILLED_NURSING_FACILITY): Payer: Medicare Other | Admitting: Nurse Practitioner

## 2021-01-14 ENCOUNTER — Encounter: Payer: Self-pay | Admitting: Nurse Practitioner

## 2021-01-14 DIAGNOSIS — S0101XA Laceration without foreign body of scalp, initial encounter: Secondary | ICD-10-CM | POA: Insufficient documentation

## 2021-01-14 DIAGNOSIS — F01518 Vascular dementia, unspecified severity, with other behavioral disturbance: Secondary | ICD-10-CM

## 2021-01-14 DIAGNOSIS — W19XXXA Unspecified fall, initial encounter: Secondary | ICD-10-CM

## 2021-01-14 DIAGNOSIS — F0151 Vascular dementia with behavioral disturbance: Secondary | ICD-10-CM | POA: Diagnosis not present

## 2021-01-14 DIAGNOSIS — S0101XD Laceration without foreign body of scalp, subsequent encounter: Secondary | ICD-10-CM | POA: Diagnosis not present

## 2021-01-14 DIAGNOSIS — N1831 Chronic kidney disease, stage 3a: Secondary | ICD-10-CM

## 2021-01-14 DIAGNOSIS — F418 Other specified anxiety disorders: Secondary | ICD-10-CM

## 2021-01-14 DIAGNOSIS — R2681 Unsteadiness on feet: Secondary | ICD-10-CM | POA: Diagnosis not present

## 2021-01-14 NOTE — Assessment & Plan Note (Signed)
Poor safety awareness, , resides in memory care unit [facility], takes Memantine, Donepezil, TSH 2.63 06/05/20

## 2021-01-14 NOTE — Assessment & Plan Note (Signed)
Use walk for ambulation.

## 2021-01-14 NOTE — Progress Notes (Signed)
Location:   [place] Room Number: N103 Place of Service:  SNF (31) Provider:  Mariama Saintvil Otho Darner, NP    Patient Care Team: [name], MD as PCP - General (Internal Medicine)  Extended Emergency Contact Information Primary Emergency Contact: Bertrum Sol, [facility]) [facility] Address: [address]          [place], [place] [address] [name] of [place] Phone: [phone] Relation: Relative Secondary Emergency Contact: [name] Mobile Phone: [phone] Relation: Relative  Code Status:  DNR Goals of care: Advanced Directive information Advanced Directives 01/14/2021  Does Patient Have a Medical Advance Directive? Yes  Type of Paramedic of [place];Living will;Out of facility DNR (pink MOST or yellow form)  Does patient want to make changes to medical advance directive? No - Patient declined  Copy of [place] in Chart? Yes - validated most recent copy scanned in chart (See row information)  Pre-existing out of facility DNR order (yellow form or pink MOST form) Yellow form placed in chart (order not valid for inpatient use)     Chief Complaint  Patient presents with   Follow-up    Patient following up after emergency room visit for a superior occipital scalp laceration.    HPI:  Pt is a 87 y.o. female seen today for an acute visit for fall, scalp laceration s/p staples closures, ED eval 11/13/20. The patient ambulates w/o walker for few steps, lost balance, fell, resulted in scalp laceration, staples x6 closure intact. CT head showed no acute process.   Gait abnormality, needs walker for ambulation.   Dementia, resides in memory care unit [facility], takes Memantine, Donepezil, TSH 2.63 06/05/20             Anxiety/depression, takes Seroquel, Clonazepam qhs, prn Lorazepam, Lexapro, Depakote             CKD Bun/creat 14/1.10 eGFR 49 12/04/20   Past Medical History:  Diagnosis Date   Arthritis    Dementia ([place])     Hx of concussion    [place]   Hyperlipidemia    Vitamin D deficiency, unspecified    Past Surgical History:  Procedure Laterality Date   CHOLECYSTECTOMY      Allergies  Allergen Reactions   Fish-Derived Products    Pravachol [Pravastatin Sodium]     Allergies as of 01/14/2021       Reactions   Fish-derived Products    Pravachol [pravastatin Sodium]         Medication List        Accurate as of January 14, 2021 11:59 PM. If you have any questions, ask your nurse or doctor.          STOP taking these medications    omeprazole 20 MG capsule Commonly known as: PRILOSEC Stopped by: Valaree Fresquez X Furious Chiarelli, NP       TAKE these medications    acetaminophen 325 MG tablet Commonly known as: TYLENOL Take 650 mg by mouth every 4 (four) hours as needed.   CALCIUM-VITAMIN D PO Take 1 tablet by mouth in the morning and at bedtime. 627m (1,5068m - 400 unit   clonazePAM 0.5 MG tablet Commonly known as: KLONOPIN Take 1 tablet (0.5 mg total) by mouth at bedtime.   divalproex 250 MG DR tablet Commonly known as: DEPAKOTE Take 250 mg by mouth daily.   donepezil 5 MG tablet Commonly known as: ARICEPT Take 5 mg by mouth 2 (two) times daily.  escitalopram 10 MG tablet Commonly known as: LEXAPRO Take 10 mg by mouth daily.   LORazepam 0.5 MG tablet Commonly known as: ATIVAN Take 0.5 mg by mouth daily as needed for anxiety.   LORazepam 0.5 MG tablet Commonly known as: ATIVAN Take 0.5 mg by mouth once a week.   memantine 10 MG tablet Commonly known as: NAMENDA Take 10 mg by mouth 2 (two) times daily.   POLYVINYL ALCOHOL-POVIDONE OP Place 2 drops into both eyes 3 (three) times daily. drops; 0.5-0.6 %; amt: 2 drops each eye; ophthalmic (eye)   QUEtiapine 25 MG tablet Commonly known as: SEROQUEL Take 25 mg by mouth 3 (three) times daily.        Review of Systems  Constitutional:  Negative for activity change, appetite change and fever.   HENT:  Positive for hearing loss. Negative for congestion and voice change.   Eyes:  Negative for visual disturbance.       Dry eyes  Respiratory:  Negative for cough.   Cardiovascular:  Negative for leg swelling.  Gastrointestinal:  Negative for abdominal pain and constipation.       1x after lunch yesterday 11/29/20  Genitourinary:  Negative for dysuria and urgency.  Musculoskeletal:  Positive for arthralgias and gait problem.       Gait has improved, rails or furniture walking sometimes.   Skin:  Positive for wound. Negative for color change.  Neurological:  Negative for speech difficulty, weakness and headaches.       Dementia  Psychiatric/Behavioral:  Positive for behavioral problems and confusion. Negative for sleep disturbance. The patient is nervous/anxious.        Improved.    Immunization History  Administered Date(s) Administered   Influenza-Unspecified 02/26/2019, 03/07/2020   Moderna Sars-Covid-2 Vaccination 05/28/2019, 06/25/2019, 04/03/2020, 10/23/2020   Pneumococcal-Unspecified 05/22/2016   Tdap 01/13/2021   Pertinent  Health Maintenance Due  Topic Date Due   DEXA SCAN  Never done   PNA vac Low Risk Adult (2 of 2 - PCV13) 05/22/2017   INFLUENZA VACCINE  12/24/2020   No flowsheet data found. Functional Status Survey:    Vitals:   01/14/21 1510  BP: 138/78  Pulse: 66  Resp: 20  Temp: (!) 96.6 F (35.9 C)  SpO2: 97%  Weight: 154 lb (69.9 kg)  Height: '5\' 6"'  (1.676 m)   Body mass index is 24.86 kg/m. Physical Exam Vitals and nursing note reviewed.  Constitutional:      Appearance: Normal appearance.  HENT:     Head: Normocephalic and atraumatic.     Mouth/Throat:     Mouth: Mucous membranes are moist.  Eyes:     Extraocular Movements: Extraocular movements intact.     Conjunctiva/sclera: Conjunctivae normal.     Pupils: Pupils are equal, round, and reactive to light.  Cardiovascular:     Rate and Rhythm: Normal rate and regular rhythm.     Heart  sounds: No murmur heard. Pulmonary:     Effort: Pulmonary effort is normal.     Breath sounds: No rales.  Abdominal:     Palpations: Abdomen is soft.     Tenderness: There is no abdominal tenderness.  Musculoskeletal:     Right lower leg: No edema.     Left lower leg: No edema.  Skin:    General: Skin is warm and dry.     Findings: Bruising present.     Comments: Left parietal scalp laceration is well approximated with staples, no s/s of bleeding or infection.  Neurological:     General: No focal deficit present.     Mental Status: She is alert.     Gait: Gait abnormal.     Comments: Oriented to person  Psychiatric:     Comments: Confused, pleasant, conversing.     Labs reviewed: Recent Labs    05/11/20 0532 05/12/20 0525 05/13/20 0516 05/14/20 0440 05/29/20 0000 06/06/20 0000 08/02/20 0000 12/05/20 0000  NA 139 141 139 139   < > 142 141 135*  K 3.7 3.5 3.4* 4.0   < > 4.0 4.8 5.0  CL 103 103 102 104   < > 105 104 99  CO2 '26 27 26 25   ' < > 30* 30* 29*  GLUCOSE 88 87 113* 80  --   --   --   --   BUN '13 13 13 14   ' < > '16 13 14  ' CREATININE 0.96 1.09* 1.11* 1.08*   < > 1.3* 1.0 1.1  CALCIUM 8.8* 8.7* 9.0 9.0   < > 10.0 9.3 9.5  MG 2.2 2.2 2.1  --   --   --   --   --   PHOS 3.9 3.5 3.4  --   --   --   --   --    < > = values in this interval not displayed.   Recent Labs    05/08/20 0446 05/10/20 0511 05/13/20 0516 05/29/20 0000 06/06/20 0000 12/05/20 0000  AST '30 21 18 17 16 ' 11*  ALT 64* 38 '27 12 13 ' 3*  ALKPHOS 152* 141* 112 76 68 71  BILITOT 0.6 0.6 0.5  --   --   --   PROT 5.7* 5.7* 6.5  --   --   --   ALBUMIN 2.4* 2.3* 2.7* 3.7 3.5 3.6   Recent Labs    05/02/20 2225 05/06/20 1937 05/08/20 0704 05/10/20 0511 05/12/20 0525 05/13/20 0516 05/29/20 0000 12/05/20 0000  WBC 7.3 6.9   < > 6.4 7.5 7.3 5.5 6.9  NEUTROABS 6.5 5.3  --   --   --   --   --  4,444.00  HGB 11.4* 11.3*   < > 10.5* 10.9* 11.9* 12.5 12.8  HCT 36.1 35.3*   < > 32.7* 33.9* 36.4 37  39  MCV 93.0 93.1   < > 92.1 92.9 92.9  --   --   PLT 132* 117*   < > 228 242 266 206 229   < > = values in this interval not displayed.   Lab Results  Component Value Date   TSH 5.78 03/28/2020   No results found for: HGBA1C No results found for: CHOL, HDL, LDLCALC, LDLDIRECT, TRIG, CHOLHDL  Significant Diagnostic Results in last 30 days:  CT Head Wo Contrast  Result Date: 01/13/2021 CLINICAL DATA:  Fall, head injury, scalp laceration EXAM: CT HEAD WITHOUT CONTRAST TECHNIQUE: Contiguous axial images were obtained from the base of the skull through the vertex without intravenous contrast. COMPARISON:  05/22/2020 FINDINGS: Brain: Normal anatomic configuration. Parenchymal volume loss is commensurate with the patient's age. Mild periventricular white matter changes are present likely reflecting the sequela of small vessel ischemia. No abnormal intra or extra-axial mass lesion or fluid collection. No abnormal mass effect or midline shift. No evidence of acute intracranial hemorrhage or infarct. Borderline ventriculomegaly likely reflects the sequela of central atrophy and is stable since prior examination. Cerebellum unremarkable. Vascular: No asymmetric hyperdense vasculature at the skull base. Skull: Intact Sinuses/Orbits: Paranasal sinuses  are clear. Orbits are unremarkable. Other: Mastoid air cells and middle ear cavities are clear. Skin staples are seen at the left parietal vertex. IMPRESSION: No acute intracranial abnormality.  No calvarial fracture. Stable senescent change. Electronically Signed   By: Fidela Salisbury M.D.   On: 01/13/2021 19:30    Assessment/Plan Scalp laceration fall, scalp laceration s/p staples closures, ED eval 11/13/20. The patient ambulates w/o walker for few steps, lost balance, fell, resulted in scalp laceration, staples x6 closure intact. CT head showed no acute process. Remove staples 7 days.   Unsteady gait Use walk for ambulation.   Fall Not using walker is the  cause of falling, supervision for safety.   Vascular dementia ([place]) Poor safety awareness, , resides in memory care unit [facility], takes Memantine, Donepezil, TSH 2.63 06/05/20  Depression with anxiety , takes Seroquel, Clonazepam qhs, prn Lorazepam, Lexapro, Depakote  CKD (chronic kidney disease) stage 3, GFR 30-59 ml/min (HCC) Bun/creat 14/1.10 eGFR 49 12/04/20     Family/ staff Communication: plan of care reviewed with the patient and charge nurse.   Labs/tests ordered:  none  Time spend 35 minutes.

## 2021-01-14 NOTE — Assessment & Plan Note (Signed)
fall, scalp laceration s/p staples closures, ED eval 11/13/20. The patient ambulates w/o walker for few steps, lost balance, fell, resulted in scalp laceration, staples x6 closure intact. CT head showed no acute process. Remove staples 7 days.

## 2021-01-14 NOTE — Assessment & Plan Note (Signed)
,   takes Seroquel, Clonazepam qhs, prn Lorazepam, Lexapro, Depakote

## 2021-01-14 NOTE — Assessment & Plan Note (Signed)
Not using walker is the cause of falling, supervision for safety.

## 2021-01-14 NOTE — Assessment & Plan Note (Signed)
Bun/creat 14/1.10 eGFR 49 12/04/20

## 2021-01-15 ENCOUNTER — Encounter: Payer: Self-pay | Admitting: Nurse Practitioner

## 2021-01-25 ENCOUNTER — Non-Acute Institutional Stay (SKILLED_NURSING_FACILITY): Payer: Medicare Other | Admitting: Nurse Practitioner

## 2021-01-25 ENCOUNTER — Encounter: Payer: Self-pay | Admitting: Nurse Practitioner

## 2021-01-25 DIAGNOSIS — F0151 Vascular dementia with behavioral disturbance: Secondary | ICD-10-CM

## 2021-01-25 DIAGNOSIS — N1831 Chronic kidney disease, stage 3a: Secondary | ICD-10-CM | POA: Diagnosis not present

## 2021-01-25 DIAGNOSIS — R2681 Unsteadiness on feet: Secondary | ICD-10-CM | POA: Diagnosis not present

## 2021-01-25 DIAGNOSIS — F418 Other specified anxiety disorders: Secondary | ICD-10-CM | POA: Diagnosis not present

## 2021-01-25 DIAGNOSIS — F01518 Vascular dementia, unspecified severity, with other behavioral disturbance: Secondary | ICD-10-CM

## 2021-01-25 NOTE — Assessment & Plan Note (Signed)
resides in memory care unit [facility], takes Memantine, Donepezil, TSH 2.63 06/05/20  

## 2021-01-25 NOTE — Assessment & Plan Note (Signed)
Her mood is managed on Seroquel, Clonazepam qhs, prn Lorazepam, Lexapro, Depakote

## 2021-01-25 NOTE — Assessment & Plan Note (Signed)
needs walker for ambulation.  

## 2021-01-25 NOTE — Progress Notes (Signed)
Location:   SNF [place] Room Number: L892 Place of Service:  SNF (31) Provider: [facility] Javier Gell NP  [name], MD  Patient Care Team: [name], MD as PCP - General (Internal Medicine)  Extended Emergency Contact Information Primary Emergency Contact: Bertrum Sol, [facility]) [facility] Address: [address]          [place], [place] [address] [name] of [place] Phone: [phone] Relation: Relative Secondary Emergency Contact: [name] Mobile Phone: [phone] Relation: Relative  Code Status:  DNR Goals of care: Advanced Directive information Advanced Directives 01/25/2021  Does Patient Have a Medical Advance Directive? Yes  Type of Paramedic of [place];Living will;Out of facility DNR (pink MOST or yellow form)  Does patient want to make changes to medical advance directive? No - Patient declined  Copy of [place] in Chart? Yes - validated most recent copy scanned in chart (See row information)  Pre-existing out of facility DNR order (yellow form or pink MOST form) Yellow form placed in chart (order not valid for inpatient use)     Chief Complaint  Patient presents with   Medical Management of Chronic Issues    Routine follow up    Health Maintenance    Discuss need for shingles vaccine, Dexa scan, PNA vaccine, and influenza vaccine.     HPI:  Pt is a 87 y.o. female seen today for medical management of chronic diseases.      Gait abnormality, needs walker for ambulation.              Dementia, resides in memory care unit [facility], takes Memantine, Donepezil, TSH 2.63 06/05/20             Anxiety/depression, takes Seroquel, Clonazepam qhs, prn Lorazepam, Lexapro, Depakote             CKD Bun/creat 14/1.10 eGFR 49 12/04/20  Past Medical History:  Diagnosis Date   Arthritis    Dementia ([place])    Hx of concussion    [place]   Hyperlipidemia    Vitamin D deficiency, unspecified     Past Surgical History:  Procedure Laterality Date   CHOLECYSTECTOMY      Allergies  Allergen Reactions   Fish-Derived Products    Pravachol [Pravastatin Sodium]     Allergies as of 01/25/2021       Reactions   Fish-derived Products    Pravachol [pravastatin Sodium]         Medication List        Accurate as of January 25, 2021 11:59 PM. If you have any questions, ask your nurse or doctor.          STOP taking these medications    acetaminophen 325 MG tablet Commonly known as: TYLENOL Stopped by: Mordche Hedglin X Avid Guillette, NP       TAKE these medications    CALCIUM-VITAMIN D PO Take 1 tablet by mouth in the morning and at bedtime. 664m (1,5060m - 400 unit   clonazePAM 0.5 MG tablet Commonly known as: KLONOPIN Take 1 tablet (0.5 mg total) by mouth at bedtime.   divalproex 250 MG DR tablet Commonly known as: DEPAKOTE Take 250 mg by mouth daily.   donepezil 5 MG tablet Commonly known as: ARICEPT Take 5 mg by mouth 2 (two) times daily.   escitalopram 10 MG tablet Commonly known as: LEXAPRO Take 10 mg by mouth daily.   LORazepam 0.5 MG tablet Commonly known as: ATIVAN Take  0.5 mg by mouth daily as needed for anxiety. What changed: Another medication with the same name was removed. Continue taking this medication, and follow the directions you see here. Changed by: Angela Platner X Greenlee Ancheta, NP   memantine 10 MG tablet Commonly known as: NAMENDA Take 10 mg by mouth 2 (two) times daily.   POLYVINYL ALCOHOL-POVIDONE OP Place 2 drops into both eyes 3 (three) times daily. drops; 0.5-0.6 %; amt: 2 drops each eye; ophthalmic (eye)   QUEtiapine 25 MG tablet Commonly known as: SEROQUEL Take 25 mg by mouth 3 (three) times daily.        Review of Systems  Constitutional:  Negative for fatigue, fever and unexpected weight change.  HENT:  Positive for hearing loss. Negative for congestion and voice change.   Eyes:  Negative for visual disturbance.       Dry eyes   Respiratory:  Negative for cough.   Cardiovascular:  Negative for leg swelling.  Gastrointestinal:  Negative for abdominal pain and constipation.       1x after lunch yesterday 11/29/20  Genitourinary:  Negative for dysuria and urgency.  Musculoskeletal:  Positive for arthralgias and gait problem.       Gait has improved, rails or furniture walking sometimes.   Skin:  Negative for color change.  Neurological:  Negative for speech difficulty, weakness and light-headedness.       Dementia  Psychiatric/Behavioral:  Positive for behavioral problems and confusion. Negative for sleep disturbance. The patient is nervous/anxious.        Improved.    Immunization History  Administered Date(s) Administered   Influenza-Unspecified 02/26/2019, 03/07/2020   Moderna Sars-Covid-2 Vaccination 05/28/2019, 06/25/2019, 04/03/2020, 10/23/2020   Pneumococcal-Unspecified 05/22/2016   Tdap 01/13/2021   Pertinent  Health Maintenance Due  Topic Date Due   DEXA SCAN  Never done   PNA vac Low Risk Adult (2 of 2 - PCV13) 05/22/2017   INFLUENZA VACCINE  12/24/2020   No flowsheet data found. Functional Status Survey:    Vitals:   01/25/21 1544  BP: (!) 148/90  Pulse: 80  Resp: 18  Temp: (!) 97.4 F (36.3 C)  SpO2: 93%  Weight: 154 lb 6.4 oz (70 kg)  Height: '5\' 6"'  (1.676 m)   Body mass index is 24.92 kg/m. Physical Exam Vitals and nursing note reviewed.  Constitutional:      Appearance: Normal appearance.  HENT:     Head: Normocephalic and atraumatic.     Mouth/Throat:     Mouth: Mucous membranes are moist.  Eyes:     Extraocular Movements: Extraocular movements intact.     Conjunctiva/sclera: Conjunctivae normal.     Pupils: Pupils are equal, round, and reactive to light.  Cardiovascular:     Rate and Rhythm: Normal rate and regular rhythm.     Heart sounds: No murmur heard. Pulmonary:     Effort: Pulmonary effort is normal.     Breath sounds: No rales.  Abdominal:     Palpations:  Abdomen is soft.     Tenderness: There is no abdominal tenderness.  Musculoskeletal:     Right lower leg: No edema.     Left lower leg: No edema.  Skin:    General: Skin is warm and dry.     Findings: Bruising present.     Comments: Left parietal scalp laceration is healed.   Neurological:     General: No focal deficit present.     Mental Status: She is alert.  Gait: Gait abnormal.     Comments: Oriented to person  Psychiatric:     Comments: Confused, pleasant, conversing.     Labs reviewed: Recent Labs    05/11/20 0532 05/12/20 0525 05/13/20 0516 05/14/20 0440 05/29/20 0000 06/06/20 0000 08/02/20 0000 12/05/20 0000  NA 139 141 139 139   < > 142 141 135*  K 3.7 3.5 3.4* 4.0   < > 4.0 4.8 5.0  CL 103 103 102 104   < > 105 104 99  CO2 '26 27 26 25   ' < > 30* 30* 29*  GLUCOSE 88 87 113* 80  --   --   --   --   BUN '13 13 13 14   ' < > '16 13 14  ' CREATININE 0.96 1.09* 1.11* 1.08*   < > 1.3* 1.0 1.1  CALCIUM 8.8* 8.7* 9.0 9.0   < > 10.0 9.3 9.5  MG 2.2 2.2 2.1  --   --   --   --   --   PHOS 3.9 3.5 3.4  --   --   --   --   --    < > = values in this interval not displayed.   Recent Labs    05/08/20 0446 05/10/20 0511 05/13/20 0516 05/29/20 0000 06/06/20 0000 12/05/20 0000  AST '30 21 18 17 16 ' 11*  ALT 64* 38 '27 12 13 ' 3*  ALKPHOS 152* 141* 112 76 68 71  BILITOT 0.6 0.6 0.5  --   --   --   PROT 5.7* 5.7* 6.5  --   --   --   ALBUMIN 2.4* 2.3* 2.7* 3.7 3.5 3.6   Recent Labs    05/02/20 2225 05/06/20 1937 05/08/20 0704 05/10/20 0511 05/12/20 0525 05/13/20 0516 05/29/20 0000 12/05/20 0000  WBC 7.3 6.9   < > 6.4 7.5 7.3 5.5 6.9  NEUTROABS 6.5 5.3  --   --   --   --   --  4,444.00  HGB 11.4* 11.3*   < > 10.5* 10.9* 11.9* 12.5 12.8  HCT 36.1 35.3*   < > 32.7* 33.9* 36.4 37 39  MCV 93.0 93.1   < > 92.1 92.9 92.9  --   --   PLT 132* 117*   < > 228 242 266 206 229   < > = values in this interval not displayed.   Lab Results  Component Value Date   TSH 5.78  03/28/2020   No results found for: HGBA1C No results found for: CHOL, HDL, LDLCALC, LDLDIRECT, TRIG, CHOLHDL  Significant Diagnostic Results in last 30 days:  CT Head Wo Contrast  Result Date: 01/13/2021 CLINICAL DATA:  Fall, head injury, scalp laceration EXAM: CT HEAD WITHOUT CONTRAST TECHNIQUE: Contiguous axial images were obtained from the base of the skull through the vertex without intravenous contrast. COMPARISON:  05/22/2020 FINDINGS: Brain: Normal anatomic configuration. Parenchymal volume loss is commensurate with the patient's age. Mild periventricular white matter changes are present likely reflecting the sequela of small vessel ischemia. No abnormal intra or extra-axial mass lesion or fluid collection. No abnormal mass effect or midline shift. No evidence of acute intracranial hemorrhage or infarct. Borderline ventriculomegaly likely reflects the sequela of central atrophy and is stable since prior examination. Cerebellum unremarkable. Vascular: No asymmetric hyperdense vasculature at the skull base. Skull: Intact Sinuses/Orbits: Paranasal sinuses are clear. Orbits are unremarkable. Other: Mastoid air cells and middle ear cavities are clear. Skin staples are seen at the left parietal  vertex. IMPRESSION: No acute intracranial abnormality.  No calvarial fracture. Stable senescent change. Electronically Signed   By: Fidela Salisbury M.D.   On: 01/13/2021 19:30    Assessment/Plan  Vascular dementia (HCC)  resides in memory care unit [facility], takes Memantine, Donepezil, TSH 2.63 06/05/20  Depression with anxiety Her mood is managed on Seroquel, Clonazepam qhs, prn Lorazepam, Lexapro, Depakote  CKD (chronic kidney disease) stage 3, GFR 30-59 ml/min (HCC) Bun/creat 14/1.10 eGFR 49 12/04/20   Unsteady gait needs walker for ambulation.    Family/ staff Communication: plan of care reviewed with the patient and charge nurse.   Labs/tests ordered:  none  Time spend 35 minutes.

## 2021-01-25 NOTE — Assessment & Plan Note (Signed)
Bun/creat 14/1.10 eGFR 49 12/04/20

## 2021-01-30 ENCOUNTER — Encounter: Payer: Self-pay | Admitting: Nurse Practitioner

## 2021-02-11 ENCOUNTER — Encounter: Payer: Self-pay | Admitting: Internal Medicine

## 2021-02-11 ENCOUNTER — Non-Acute Institutional Stay (SKILLED_NURSING_FACILITY): Payer: Medicare Other | Admitting: Internal Medicine

## 2021-02-11 DIAGNOSIS — F418 Other specified anxiety disorders: Secondary | ICD-10-CM

## 2021-02-11 DIAGNOSIS — G301 Alzheimer's disease with late onset: Secondary | ICD-10-CM | POA: Diagnosis not present

## 2021-02-11 DIAGNOSIS — N1831 Chronic kidney disease, stage 3a: Secondary | ICD-10-CM | POA: Diagnosis not present

## 2021-02-11 DIAGNOSIS — F0281 Dementia in other diseases classified elsewhere with behavioral disturbance: Secondary | ICD-10-CM

## 2021-02-11 DIAGNOSIS — R531 Weakness: Secondary | ICD-10-CM

## 2021-02-11 NOTE — Progress Notes (Signed)
.  [facility] of Service:  SNF (31)  Provider:   Code Status: DNR Goals of Care:  Advanced Directives 01/25/2021  Does Patient Have a Medical Advance Directive? Yes  Type of Estate agent of [place];Living will;Out of facility DNR (pink MOST or yellow form)  Does patient want to make changes to medical advance directive? No - Patient declined  Copy of Healthcare Power of Attorney in Chart? Yes - validated most recent copy scanned in chart (See row information)  Pre-existing out of facility DNR order (yellow form or pink MOST form) Yellow form placed in chart (order not valid for inpatient use)     Chief Complaint  Patient presents with   Acute Visit    HPI: Patient is a 87 y.o. female seen today for an acute visit for Feeling weak and tired  Patient has Dementia and Lives in Memory Unit  CT Scan shows Moderate severity cerebral atrophy and microvascular disease  Also h/o  Intraabdominal Abscess/ Liver abscess IR was consulted. Underwent Liver Abscess Aspiration on 05/07/20  Noticed by [place] to be weak Sleeping more C/o Tired. Covid tested negative Denies Fever or Chills Or Cough or dysuria Just c/o Feeling tired No Nausea or vomiting Eating well   Past Medical History:  Diagnosis Date   Arthritis    Dementia (HCC)    Hx of concussion    [facility]   Hyperlipidemia    Vitamin D deficiency, unspecified     Past Surgical History:  Procedure Laterality Date   CHOLECYSTECTOMY      Allergies  Allergen Reactions   Fish-Derived Products    Pravachol [Pravastatin Sodium]     Outpatient Encounter Medications as of 02/11/2021  Medication Sig   CALCIUM-VITAMIN D PO Take 1 tablet by mouth in the morning and at bedtime. 600mg  (1,500mg ) - 400 unit   clonazePAM (KLONOPIN) 0.5 MG tablet Take 1 tablet (0.5 mg total) by mouth at bedtime.   divalproex (DEPAKOTE) 250 MG DR tablet Take 250 mg by mouth  daily.   donepezil (ARICEPT) 5 MG tablet Take 5 mg by mouth 2 (two) times daily.   escitalopram (LEXAPRO) 10 MG tablet Take 10 mg by mouth daily.   LORazepam (ATIVAN) 0.5 MG tablet Take 0.5 mg by mouth daily as needed for anxiety.   memantine (NAMENDA) 10 MG tablet Take 10 mg by mouth 2 (two) times daily.   POLYVINYL ALCOHOL-POVIDONE OP Place 2 drops into both eyes 3 (three) times daily. drops; 0.5-0.6 %; amt: 2 drops each eye; ophthalmic (eye)   QUEtiapine (SEROQUEL) 25 MG tablet Take 25 mg by mouth 3 (three) times daily.   No facility-administered encounter medications on file as of 02/11/2021.    Review of Systems:  Review of Systems  Constitutional:  Positive for activity change.  HENT:  Positive for congestion.   Respiratory: Negative.    Cardiovascular: Negative.   Gastrointestinal: Negative.   Genitourinary: Negative.   Musculoskeletal:  Positive for gait problem.  Skin: Negative.   Neurological:  Positive for weakness.  Psychiatric/Behavioral:  Positive for confusion.    Health Maintenance  Topic Date Due   Zoster Vaccines- Shingrix (1 of 2) Never done   DEXA SCAN  Never done   INFLUENZA VACCINE  12/24/2020   TETANUS/TDAP  01/14/2031   COVID-19 Vaccine  Completed   HPV VACCINES  Aged Out    Physical Exam: Vitals:   02/11/21 1515  BP: 136/74  Pulse:  82  Resp: 20  Temp: 97.7 F (36.5 C)  Weight: 154 lb (69.9 kg)   Body mass index is 24.86 kg/m. Physical Exam Constitutional:  Well-developed and well-nourished.  HENT:  Head: Normocephalic.  Mouth/Throat: Oropharynx is clear and moist.  Eyes: Pupils are equal, round, and reactive to light.  Neck: Neck supple.  Cardiovascular: Normal rate and normal heart sounds.  No murmur heard. Pulmonary/Chest: Effort normal and breath sounds normal. No respiratory distress. No wheezes. She has no rales.  Abdominal: Soft. Bowel sounds are normal. No distension. There is no tenderness. There is no rebound.  Musculoskeletal:  No edema.  Lymphadenopathy: none Neurological: Alert Mental status  at baseline but does look  Tired not her usual self Skin: Skin is warm and dry.  Psychiatric: Normal mood and affect. Behavior is normal. Thought content normal.   Labs reviewed: Basic Metabolic Panel: Recent Labs    03/28/20 0000 05/02/20 2225 05/11/20 0532 05/12/20 0525 05/13/20 0516 05/14/20 0440 05/29/20 0000 06/06/20 0000 08/02/20 0000 12/05/20 0000  NA 133*   < > 139 141 139 139   < > 142 141 135*  K 4.6   < > 3.7 3.5 3.4* 4.0   < > 4.0 4.8 5.0  CL 96*   < > 103 103 102 104   < > 105 104 99  CO2 25*   < > 26 27 26 25    < > 30* 30* 29*  GLUCOSE  --    < > 88 87 113* 80  --   --   --   --   BUN 17   < > 13 13 13 14    < > 16 13 14   CREATININE 1.0   < > 0.96 1.09* 1.11* 1.08*   < > 1.3* 1.0 1.1  CALCIUM 9.4   < > 8.8* 8.7* 9.0 9.0   < > 10.0 9.3 9.5  MG  --    < > 2.2 2.2 2.1  --   --   --   --   --   PHOS  --    < > 3.9 3.5 3.4  --   --   --   --   --   TSH 5.78  --   --   --   --   --   --   --   --   --    < > = values in this interval not displayed.   Liver Function Tests: Recent Labs    05/08/20 0446 05/10/20 0511 05/13/20 0516 05/29/20 0000 06/06/20 0000 12/05/20 0000  AST 30 21 18 17 16  11*  ALT 64* 38 27 12 13  3*  ALKPHOS 152* 141* 112 76 68 71  BILITOT 0.6 0.6 0.5  --   --   --   PROT 5.7* 5.7* 6.5  --   --   --   ALBUMIN 2.4* 2.3* 2.7* 3.7 3.5 3.6   No results for input(s): LIPASE, AMYLASE in the last 8760 hours. No results for input(s): AMMONIA in the last 8760 hours. CBC: Recent Labs    05/02/20 2225 05/06/20 1937 05/08/20 0704 05/10/20 0511 05/12/20 0525 05/13/20 0516 05/29/20 0000 12/05/20 0000  WBC 7.3 6.9   < > 6.4 7.5 7.3 5.5 6.9  NEUTROABS 6.5 5.3  --   --   --   --   --  4,444.00  HGB 11.4* 11.3*   < > 10.5* 10.9* 11.9* 12.5 12.8  HCT 36.1 35.3*   < >  32.7* 33.9* 36.4 37 39  MCV 93.0 93.1   < > 92.1 92.9 92.9  --   --   PLT 132* 117*   < > 228 242 266 206 229    < > = values in this interval not displayed.   Lipid Panel: No results for input(s): CHOL, HDL, LDLCALC, TRIG, CHOLHDL, LDLDIRECT in the last 8760 hours. No results found for: HGBA1C  Procedures since last visit: CT Head Wo Contrast  Result Date: 01/13/2021 CLINICAL DATA:  Fall, head injury, scalp laceration EXAM: CT HEAD WITHOUT CONTRAST TECHNIQUE: Contiguous axial images were obtained from the base of the skull through the vertex without intravenous contrast. COMPARISON:  05/22/2020 FINDINGS: Brain: Normal anatomic configuration. Parenchymal volume loss is commensurate with the patient's age. Mild periventricular white matter changes are present likely reflecting the sequela of small vessel ischemia. No abnormal intra or extra-axial mass lesion or fluid collection. No abnormal mass effect or midline shift. No evidence of acute intracranial hemorrhage or infarct. Borderline ventriculomegaly likely reflects the sequela of central atrophy and is stable since prior examination. Cerebellum unremarkable. Vascular: No asymmetric hyperdense vasculature at the skull base. Skull: Intact Sinuses/Orbits: Paranasal sinuses are clear. Orbits are unremarkable. Other: Mastoid air cells and middle ear cavities are clear. Skin staples are seen at the left parietal vertex. IMPRESSION: No acute intracranial abnormality.  No calvarial fracture. Stable senescent change. Electronically Signed   By: Helyn Numbers M.D.   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

## 2021-02-13 ENCOUNTER — Non-Acute Institutional Stay (SKILLED_NURSING_FACILITY): Payer: Medicare Other | Admitting: Orthopedic Surgery

## 2021-02-13 DIAGNOSIS — N1831 Chronic kidney disease, stage 3a: Secondary | ICD-10-CM

## 2021-02-13 DIAGNOSIS — F0281 Dementia in other diseases classified elsewhere with behavioral disturbance: Secondary | ICD-10-CM

## 2021-02-13 DIAGNOSIS — F418 Other specified anxiety disorders: Secondary | ICD-10-CM | POA: Diagnosis not present

## 2021-02-13 DIAGNOSIS — G301 Alzheimer's disease with late onset: Secondary | ICD-10-CM

## 2021-02-13 DIAGNOSIS — R400 Somnolence: Secondary | ICD-10-CM

## 2021-02-14 ENCOUNTER — Encounter: Payer: Self-pay | Admitting: Orthopedic Surgery

## 2021-02-14 LAB — HEPATIC FUNCTION PANEL
ALT: 9 (ref 7–35)
AST: 16 (ref 13–35)
Alkaline Phosphatase: 77 (ref 25–125)
Bilirubin, Total: 0.3

## 2021-02-14 LAB — COMPREHENSIVE METABOLIC PANEL
Albumin: 3.5 (ref 3.5–5.0)
Calcium: 11.6 — AB (ref 8.7–10.7)
Globulin: 3.1

## 2021-02-14 LAB — BASIC METABOLIC PANEL
BUN: 32 — AB (ref 4–21)
CO2: 29 — AB (ref 13–22)
Chloride: 102 (ref 99–108)
Creatinine: 2.2 — AB (ref 0.5–1.1)
Glucose: 108
Potassium: 3.6 (ref 3.4–5.3)
Sodium: 138 (ref 137–147)

## 2021-02-14 NOTE — Progress Notes (Signed)
Location:  [facility] Room Number: [id number] Place of Service:  SNF [phone]) Provider:  Hazle Nordmann, AGNP-C  [name], MD  Patient Care Team: [name], MD as PCP - General (Internal Medicine)  Extended Emergency Contact Information Primary Emergency Contact: Ramiro Harvest, [facility]) [facility] Address: [address]          [place], [place] [address] [place] Phone: [phone] Relation: Relative Secondary Emergency Contact: Marlowe Kays Mobile Phone: [phone] Relation: Relative  Code Status:  DNR Goals of care: Advanced Directive information Advanced Directives 01/25/2021  Does Patient Have a Medical Advance Directive? Yes  Type of Estate agent of [place];Living will;Out of facility DNR (pink MOST or yellow form)  Does patient want to make changes to medical advance directive? No - Patient declined  Copy of Healthcare Power of Attorney in Chart? Yes - validated most recent copy scanned in chart (See row information)  Pre-existing out of facility DNR order (yellow form or pink MOST form) Yellow form placed in chart (order not valid for inpatient use)     Chief Complaint  Patient presents with   Acute Visit    HPI:  Pt is a 87 y.o. female seen today for acute visit due to increased sleepiness and poor po intake.   Resides on the memory care unit at [facility] due to dementia.   09/19 she was seen by Dr. Chales Abrahams for increased generalized weakness due to increased sleep. Covid test negative. No other cold symptoms reported. Still eating meals well. Labs ordered.   Today, nursing staff reports increased somnolence. She is not eating and drinking well. BUN 34, creat 2.06, CO2 34, calcium 13.1, albumin 3.5. Cbc/diff unremarkable. Repeat covid test performed, negative. She is easily aroused, will talk for a few minutes and then go back to sleep.   Alzheimer's- CT head 12/2020 revealed mild periventricular  white matter changes reflecting small vessel ischemia, namenda, aricept and seroquel daily Depression/anxiety- no recent panic attacks, remains on lexapro and depakote daily, ativan prn  No recent falls, injuries or behavioral outbursts.    Past Medical History:  Diagnosis Date   Arthritis    Dementia (HCC)    Hx of concussion    [facility]   Hyperlipidemia    Vitamin D deficiency, unspecified    Past Surgical History:  Procedure Laterality Date   CHOLECYSTECTOMY      Allergies  Allergen Reactions   Fish-Derived Products    Pravachol [Pravastatin Sodium]     Outpatient Encounter Medications as of 02/13/2021  Medication Sig   CALCIUM-VITAMIN D PO Take 1 tablet by mouth in the morning and at bedtime. 600mg  (1,500mg ) - 400 unit   clonazePAM (KLONOPIN) 0.5 MG tablet Take 1 tablet (0.5 mg total) by mouth at bedtime.   divalproex (DEPAKOTE) 250 MG DR tablet Take 250 mg by mouth daily.   donepezil (ARICEPT) 5 MG tablet Take 5 mg by mouth 2 (two) times daily.   escitalopram (LEXAPRO) 10 MG tablet Take 10 mg by mouth daily.   LORazepam (ATIVAN) 0.5 MG tablet Take 0.5 mg by mouth daily as needed for anxiety.   memantine (NAMENDA) 10 MG tablet Take 10 mg by mouth 2 (two) times daily.   POLYVINYL ALCOHOL-POVIDONE OP Place 2 drops into both eyes 3 (three) times daily. drops; 0.5-0.6 %; amt: 2 drops each eye; ophthalmic (eye)   QUEtiapine (SEROQUEL) 25 MG tablet Take 25 mg by mouth 3 (three)  times daily.   No facility-administered encounter medications on file as of 02/13/2021.    Review of Systems  Unable to perform ROS: Dementia   Immunization History  Administered Date(s) Administered   Influenza-Unspecified 02/26/2019, 03/07/2020   Moderna Sars-Covid-2 Vaccination 05/28/2019, 06/25/2019, 04/03/2020, 10/23/2020   Pneumococcal-Unspecified 05/22/2016   Tdap 01/13/2021   Pertinent  Health Maintenance Due  Topic Date Due   DEXA SCAN  Never done   INFLUENZA  VACCINE  12/24/2020   No flowsheet data found. Functional Status Survey:    Vitals:   02/14/21 1623  BP: 118/76  Pulse: 72  Resp: 16  Temp: 97.7 F (36.5 C)  SpO2: 92%  Weight: 154 lb 6.4 oz (70 kg)  Height: 5\' 6"  (1.676 m)   Body mass index is 24.92 kg/m. Physical Exam Vitals reviewed.  HENT:     Head: Normocephalic.  Eyes:     General:        Right eye: No discharge.        Left eye: No discharge.     Pupils: Pupils are equal, round, and reactive to light.  Neck:     Thyroid: No thyroid mass, thyromegaly or thyroid tenderness.     Vascular: No carotid bruit.  Cardiovascular:     Rate and Rhythm: Normal rate and regular rhythm.     Pulses: Normal pulses.     Heart sounds: Normal heart sounds.  Pulmonary:     Effort: Pulmonary effort is normal. No respiratory distress.     Breath sounds: Normal breath sounds. No wheezing.  Abdominal:     General: Bowel sounds are normal. There is no distension.     Palpations: Abdomen is soft.     Tenderness: There is no abdominal tenderness.  Musculoskeletal:     Cervical back: Normal range of motion.     Right lower leg: No edema.     Left lower leg: No edema.  Lymphadenopathy:     Cervical: No cervical adenopathy.  Skin:    General: Skin is warm and dry.     Capillary Refill: Capillary refill takes less than 2 seconds.  Neurological:     General: No focal deficit present.     Mental Status: She is easily aroused. Mental status is at baseline.     Motor: Weakness present.     Gait: Gait abnormal.  Psychiatric:        Mood and Affect: Mood normal.        Behavior: Behavior normal.        Cognition and Memory: Memory is impaired.    Labs reviewed: Recent Labs    05/11/20 0532 05/12/20 0525 05/13/20 0516 05/14/20 0440 05/29/20 0000 06/06/20 0000 08/02/20 0000 12/05/20 0000  NA 139 141 139 139   < > 142 141 135*  K 3.7 3.5 3.4* 4.0   < > 4.0 4.8 5.0  CL 103 103 102 104   < > 105 104 99  CO2 26 27 26 25    < >  30* 30* 29*  GLUCOSE 88 87 113* 80  --   --   --   --   BUN 13 13 13 14    < > 16 13 14   CREATININE 0.96 1.09* 1.11* 1.08*   < > 1.3* 1.0 1.1  CALCIUM 8.8* 8.7* 9.0 9.0   < > 10.0 9.3 9.5  MG 2.2 2.2 2.1  --   --   --   --   --   PHOS  3.9 3.5 3.4  --   --   --   --   --    < > = values in this interval not displayed.   Recent Labs    05/08/20 0446 05/10/20 0511 05/13/20 0516 05/29/20 0000 06/06/20 0000 12/05/20 0000  AST 30 21 18 17 16  11*  ALT 64* 38 27 12 13  3*  ALKPHOS 152* 141* 112 76 68 71  BILITOT 0.6 0.6 0.5  --   --   --   PROT 5.7* 5.7* 6.5  --   --   --   ALBUMIN 2.4* 2.3* 2.7* 3.7 3.5 3.6   Recent Labs    05/02/20 2225 05/06/20 1937 05/08/20 0704 05/10/20 0511 05/12/20 0525 05/13/20 0516 05/29/20 0000 12/05/20 0000  WBC 7.3 6.9   < > 6.4 7.5 7.3 5.5 6.9  NEUTROABS 6.5 5.3  --   --   --   --   --  4,444.00  HGB 11.4* 11.3*   < > 10.5* 10.9* 11.9* 12.5 12.8  HCT 36.1 35.3*   < > 32.7* 33.9* 36.4 37 39  MCV 93.0 93.1   < > 92.1 92.9 92.9  --   --   PLT 132* 117*   < > 228 242 266 206 229   < > = values in this interval not displayed.   Lab Results  Component Value Date   TSH 5.78 03/28/2020   No results found for: HGBA1C No results found for: CHOL, HDL, LDLCALC, LDLDIRECT, TRIG, CHOLHDL  Significant Diagnostic Results in last 30 days:  No results found.  Assessment/Plan 1. Somnolence - increased sleepiness > 1 week - decrease depakote 125 mg daily - decrease seroquel 25 mg po bid - TSH  2. Late onset Alzheimer's dementia with behavioral disturbance (HCC) - no recent behavioral outbursts - cont namenda and aricept - resides on memory care unit - cont skilled nursing care  3. Depression with anxiety - cont lexapro, ativan prn and klonopin q hs  4. Stage 3a chronic kidney disease (HCC) - poor po intake in last 24 hours - insert IV - NS 0.9% @ 75cc/hr x 2 liters - BUN/creat 34/2.06 02/12/2021 - bmp 09/23 and in 1 week  5. Hypercalcemia -  calcium 13.1 02/12/2021 - discontinue calcium supplement - PTH    Family/ staff Communication: plan discussed with nurse  Labs/tests ordered:  TSH, PTH, bmp 09/22 and in 1 week

## 2021-02-15 LAB — COMPREHENSIVE METABOLIC PANEL: Calcium: 11.9 — AB (ref 8.7–10.7)

## 2021-02-15 LAB — TSH: TSH: 6.72 — AB (ref 0.41–5.90)

## 2021-02-18 ENCOUNTER — Non-Acute Institutional Stay (SKILLED_NURSING_FACILITY): Payer: Medicare Other | Admitting: Internal Medicine

## 2021-02-18 ENCOUNTER — Encounter: Payer: Self-pay | Admitting: Internal Medicine

## 2021-02-18 DIAGNOSIS — G301 Alzheimer's disease with late onset: Secondary | ICD-10-CM

## 2021-02-18 DIAGNOSIS — F418 Other specified anxiety disorders: Secondary | ICD-10-CM | POA: Diagnosis not present

## 2021-02-18 DIAGNOSIS — N289 Disorder of kidney and ureter, unspecified: Secondary | ICD-10-CM

## 2021-02-18 DIAGNOSIS — F02818 Dementia in other diseases classified elsewhere, unspecified severity, with other behavioral disturbance: Secondary | ICD-10-CM

## 2021-02-18 DIAGNOSIS — F0281 Dementia in other diseases classified elsewhere with behavioral disturbance: Secondary | ICD-10-CM

## 2021-02-18 LAB — PARATHYROIDECTOMY: PTH, 1-84 Bio-Intact: <6

## 2021-02-18 NOTE — Progress Notes (Signed)
Location:   [facility] Room Number: [id number] Place of Service:  SNF [phone]) Provider:  Einar Crow MD  [name], MD  Patient Care Team: [name], MD as PCP - General (Internal Medicine)  Extended Emergency Contact Information Primary Emergency Contact: Ramiro Harvest, [facility] Address: [address]          [place], [place] [address] [name] Phone: [phone] Relation: Relative Secondary Emergency Contact: Marlowe Kays Mobile Phone: [phone] Relation: Relative  Code Status:  DNR Goals of care: Advanced Directive information Advanced Directives 02/18/2021  Does Patient Have a Medical Advance Directive? Yes  Type of Estate agent of [place];Living will;Out of facility DNR (pink MOST or yellow form)  Does patient want to make changes to medical advance directive? No - Patient declined  Copy of Healthcare Power of Attorney in Chart? Yes - validated most recent copy scanned in chart (See row information)  Pre-existing out of facility DNR order (yellow form or pink MOST form) Yellow form placed in chart (order not valid for inpatient use)     Chief Complaint  Patient presents with   Acute Visit    Hypercalcemia    HPI:  Pt is a 87 y.o. female seen today for an acute visit for Hypercalcemia  Patient has Dementia and Lives in Memory Unit  CT Scan shows Moderate severity cerebral atrophy and microvascular disease  Also h/o  Intraabdominal Abscess/ Liver abscess IR was consulted. Underwent Liver Abscess Aspiration on 05/07/20  Was seen few weeks ago for feeling tired Labs showed Acure Renal Insufficiency and Hypercalcemia Creat peaked at 2.2 with Calcium of 13.1 Received 1 litre of Fluid but she pulled her IV  Her PTH came back today and it is low Her mental status is back to baseline Eating and drinking now  Past Medical History:  Diagnosis Date   Arthritis    Dementia (HCC)    Hx of  concussion    [facility]   Hyperlipidemia    Vitamin D deficiency, unspecified    Past Surgical History:  Procedure Laterality Date   CHOLECYSTECTOMY      Allergies  Allergen Reactions   Fish-Derived Products    Pravachol [Pravastatin Sodium]     Allergies as of 02/18/2021       Reactions   Fish-derived Products    Pravachol [pravastatin Sodium]         Medication List        Accurate as of February 18, 2021 11:04 AM. If you have any questions, ask your nurse or doctor.          clonazePAM 0.5 MG tablet Commonly known as: KLONOPIN Take 1 tablet (0.5 mg total) by mouth at bedtime.   divalproex 250 MG DR tablet Commonly known as: DEPAKOTE Take 125 mg by mouth daily.   donepezil 5 MG tablet Commonly known as: ARICEPT Take 5 mg by mouth 2 (two) times daily.   escitalopram 10 MG tablet Commonly known as: LEXAPRO Take 10 mg by mouth daily.   LORazepam 0.5 MG tablet Commonly known as: ATIVAN Take 0.5 mg by mouth daily as needed for anxiety.   memantine 10 MG tablet Commonly known as: NAMENDA Take 10 mg by mouth 2 (two) times daily.   POLYVINYL ALCOHOL-POVIDONE OP Place 2 drops into both eyes 3 (three) times daily. drops; 0.5-0.6 %; amt: 2 drops each eye; ophthalmic (eye)   QUEtiapine 25 MG tablet Commonly known as:  SEROQUEL Take 25 mg by mouth 2 (two) times daily.        Review of Systems  Unable to perform ROS: Dementia   Immunization History  Administered Date(s) Administered   Influenza-Unspecified 02/26/2019, 03/07/2020   Moderna Sars-Covid-2 Vaccination 05/28/2019, 06/25/2019, 04/03/2020, 10/23/2020   Pneumococcal-Unspecified 05/22/2016   Tdap 01/13/2021   Pertinent  Health Maintenance Due  Topic Date Due   DEXA SCAN  Never done   INFLUENZA VACCINE  12/24/2020   No flowsheet data found. Functional Status Survey:    Vitals:   02/18/21 1051  BP: 118/76  Pulse: 72  Resp: 16  Temp: (!) 97.5 F (36.4 C)   SpO2: 95%  Weight: 154 lb 6.4 oz (70 kg)  Height: 5\' 6"  (1.676 m)   Body mass index is 24.92 kg/m. Physical Exam Vitals reviewed.  Constitutional:      Appearance: Normal appearance.  HENT:     Head: Normocephalic.     Nose: Nose normal.     Mouth/Throat:     Mouth: Mucous membranes are moist.     Pharynx: Oropharynx is clear.  Eyes:     Pupils: Pupils are equal, round, and reactive to light.  Cardiovascular:     Rate and Rhythm: Normal rate and regular rhythm.     Pulses: Normal pulses.  Pulmonary:     Effort: Pulmonary effort is normal.     Breath sounds: Normal breath sounds.  Abdominal:     General: Abdomen is flat. Bowel sounds are normal.     Palpations: Abdomen is soft.  Musculoskeletal:        General: No swelling.     Cervical back: Neck supple.  Skin:    General: Skin is warm.  Neurological:     General: No focal deficit present.     Mental Status: She is alert.     Comments: Alert and Pleasantly confused  Psychiatric:        Mood and Affect: Mood normal.    Labs reviewed: Recent Labs    05/11/20 0532 05/12/20 0525 05/13/20 0516 05/14/20 0440 05/29/20 0000 08/02/20 0000 12/05/20 0000 02/14/21 0000 02/15/21 0000  NA 139 141 139 139   < > 141 135* 138  --   K 3.7 3.5 3.4* 4.0   < > 4.8 5.0 3.6  --   CL 103 103 102 104   < > 104 99 102  --   CO2 26 27 26 25    < > 30* 29* 29*  --   GLUCOSE 88 87 113* 80  --   --   --   --   --   BUN 13 13 13 14    < > 13 14 32*  --   CREATININE 0.96 1.09* 1.11* 1.08*   < > 1.0 1.1 2.2*  --   CALCIUM 8.8* 8.7* 9.0 9.0   < > 9.3 9.5 11.6* 11.9*  MG 2.2 2.2 2.1  --   --   --   --   --   --   PHOS 3.9 3.5 3.4  --   --   --   --   --   --    < > = values in this interval not displayed.   Recent Labs    05/08/20 0446 05/10/20 0511 05/13/20 0516 05/29/20 0000 06/06/20 0000 12/05/20 0000 02/14/21 0000  AST 30 21 18    < > 16 11* 16  ALT 64* 38 27   < > 13 3*  9  ALKPHOS 152* 141* 112   < > 68 71 77  BILITOT 0.6  0.6 0.5  --   --   --   --   PROT 5.7* 5.7* 6.5  --   --   --   --   ALBUMIN 2.4* 2.3* 2.7*   < > 3.5 3.6 3.5   < > = values in this interval not displayed.   Recent Labs    05/02/20 2225 05/06/20 1937 05/08/20 0704 05/10/20 0511 05/12/20 0525 05/13/20 0516 05/29/20 0000 12/05/20 0000  WBC 7.3 6.9   < > 6.4 7.5 7.3 5.5 6.9  NEUTROABS 6.5 5.3  --   --   --   --   --  4,444.00  HGB 11.4* 11.3*   < > 10.5* 10.9* 11.9* 12.5 12.8  HCT 36.1 35.3*   < > 32.7* 33.9* 36.4 37 39  MCV 93.0 93.1   < > 92.1 92.9 92.9  --   --   PLT 132* 117*   < > 228 242 266 206 229   < > = values in this interval not displayed.   Lab Results  Component Value Date   TSH 6.72 (A) 02/15/2021   No results found for: HGBA1C No results found for: CHOL, HDL, LDLCALC, LDLDIRECT, TRIG, CHOLHDL  Significant Diagnostic Results in last 30 days:  No results found.  Assessment/Plan Hypercalcemia ? Due to Dehydration Calcium today is 11.9 Off her PO calcium tabs PTH less then 10 Continue to encourage PO fluids Repeat Calcium level in 1 week Acute renal insufficiency Creat still above her baseline Repeat Pending Late onset Alzheimer's dementia with behavioral disturbance (HCC) On Namenda and Aricept Also on Seroquel and Klonopin Not able to taper due to her behaviors Depression with anxiety On Lexapro   Family/ staff Communication:   Labs/tests ordered:

## 2021-02-20 ENCOUNTER — Encounter: Payer: Self-pay | Admitting: Orthopedic Surgery

## 2021-02-20 ENCOUNTER — Non-Acute Institutional Stay (SKILLED_NURSING_FACILITY): Payer: Medicare Other | Admitting: Orthopedic Surgery

## 2021-02-20 DIAGNOSIS — W19XXXA Unspecified fall, initial encounter: Secondary | ICD-10-CM

## 2021-02-20 DIAGNOSIS — R400 Somnolence: Secondary | ICD-10-CM

## 2021-02-20 DIAGNOSIS — F0281 Dementia in other diseases classified elsewhere with behavioral disturbance: Secondary | ICD-10-CM

## 2021-02-20 DIAGNOSIS — N1831 Chronic kidney disease, stage 3a: Secondary | ICD-10-CM

## 2021-02-20 DIAGNOSIS — R079 Chest pain, unspecified: Secondary | ICD-10-CM | POA: Diagnosis not present

## 2021-02-20 DIAGNOSIS — G301 Alzheimer's disease with late onset: Secondary | ICD-10-CM | POA: Diagnosis not present

## 2021-02-20 DIAGNOSIS — F418 Other specified anxiety disorders: Secondary | ICD-10-CM

## 2021-02-20 DIAGNOSIS — F02818 Dementia in other diseases classified elsewhere, unspecified severity, with other behavioral disturbance: Secondary | ICD-10-CM

## 2021-02-20 LAB — COMPREHENSIVE METABOLIC PANEL
Albumin: 3.5 (ref 3.5–5.0)
Calcium: 9.5 (ref 8.7–10.7)
Globulin: 3.2

## 2021-02-20 LAB — HEPATIC FUNCTION PANEL
ALT: 8 (ref 7–35)
AST: 12 — AB (ref 13–35)
Alkaline Phosphatase: 72 (ref 25–125)
Bilirubin, Total: 0.4

## 2021-02-20 LAB — BASIC METABOLIC PANEL
BUN: 21 (ref 4–21)
CO2: 30 — AB (ref 13–22)
Chloride: 106 (ref 99–108)
Creatinine: 1.6 — AB (ref 0.5–1.1)
Glucose: 77
Potassium: 3.7 (ref 3.4–5.3)
Sodium: 144 (ref 137–147)

## 2021-02-20 NOTE — Progress Notes (Signed)
Location:  [facility] Room Number: 103 Place of Service:  SNF [phone]) Provider:  Hazle Nordmann, AGNP-C  [name], MD  Patient Care Team: [name], MD as PCP - General (Internal Medicine)  Extended Emergency Contact Information Primary Emergency Contact: Ramiro Harvest, [facility] Address: [address]          [place], [place] [address] [name] of [place] Phone: [phone] Relation: Relative Secondary Emergency Contact: Marlowe Kays Mobile Phone: [phone] Relation: Relative  Code Status:  DNR Goals of care: Advanced Directive information Advanced Directives 02/18/2021  Does Patient Have a Medical Advance Directive? Yes  Type of Estate agent of [place];Living will;Out of facility DNR (pink MOST or yellow form)  Does patient want to make changes to medical advance directive? No - Patient declined  Copy of Healthcare Power of Attorney in Chart? Yes - validated most recent copy scanned in chart (See row information)  Pre-existing out of facility DNR order (yellow form or pink MOST form) Yellow form placed in chart (order not valid for inpatient use)     Chief Complaint  Patient presents with   Acute Visit    Left sided pain d/t fall    HPI:  Pt is a 87 y.o. female seen today for acute visit due to left sided pain.   Resides on the memory care unit at [facility] due to dementia.  Nursing staff noted a skin tear to her left elbow and bruise to left cheek during care this morning. She also c/o pain when taking a deep breath. No apparent injury to head/scalp. Neuro checks initiated, normal. Vital signs normal. 09/21 she was seen for increased somnolence. She was given IV fluids and cbc/diff, bmp, tsh ordered. Depakote and Seroquel decreased. Since, last visit she has been more alert and eating > 50 % of meals.   Alzheimer's- CT head 12/2020 revealed mild periventricular white matter changes reflecting  small vessel ischemia, namenda, aricept and seroquel daily, no recent behavioral outbursts Depression/anxiety- no recent panic attacks, remains on lexapro and depakote daily, ativan prn CKD 3a- BUN/creat 34/2.06 02/12/2021 Hypercalcemia- calcium 13.1 02/12/2021, calcium discontinued   Past Medical History:  Diagnosis Date   Arthritis    Dementia (HCC)    Hx of concussion    [facility]   Hyperlipidemia    Vitamin D deficiency, unspecified    Past Surgical History:  Procedure Laterality Date   CHOLECYSTECTOMY      Allergies  Allergen Reactions   Fish-Derived Products    Pravachol [Pravastatin Sodium]     Outpatient Encounter Medications as of 02/20/2021  Medication Sig   clonazePAM (KLONOPIN) 0.5 MG tablet Take 1 tablet (0.5 mg total) by mouth at bedtime.   divalproex (DEPAKOTE) 250 MG DR tablet Take 125 mg by mouth daily.   donepezil (ARICEPT) 5 MG tablet Take 5 mg by mouth 2 (two) times daily.   escitalopram (LEXAPRO) 10 MG tablet Take 10 mg by mouth daily.   LORazepam (ATIVAN) 0.5 MG tablet Take 0.5 mg by mouth daily as needed for anxiety.   memantine (NAMENDA) 10 MG tablet Take 10 mg by mouth 2 (two) times daily.   POLYVINYL ALCOHOL-POVIDONE OP Place 2 drops into both eyes 3 (three) times daily. drops; 0.5-0.6 %; amt: 2 drops each eye; ophthalmic (eye)   QUEtiapine (SEROQUEL) 25 MG tablet Take 25 mg by mouth 2 (two) times daily.   No facility-administered encounter medications on file as of  02/20/2021.    Review of Systems  Unable to perform ROS: Dementia   Immunization History  Administered Date(s) Administered   Influenza-Unspecified 02/26/2019, 03/07/2020   Moderna Sars-Covid-2 Vaccination 05/28/2019, 06/25/2019, 04/03/2020, 10/23/2020   Pneumococcal-Unspecified 05/22/2016   Tdap 01/13/2021   Pertinent  Health Maintenance Due  Topic Date Due   DEXA SCAN  Never done   INFLUENZA VACCINE  12/24/2020   No flowsheet data found. Functional  Status Survey:    Vitals:   02/20/21 1323  BP: (!) 164/88  Pulse: 72  Resp: 18  Temp: 97.8 F (36.6 C)  SpO2: 93%  Weight: 154 lb 6.4 oz (70 kg)  Height: 5\' 6"  (1.676 m)   Body mass index is 24.92 kg/m. Physical Exam Vitals reviewed.  Constitutional:      General: She is not in acute distress. HENT:     Head: Normocephalic.     Comments: Mild bluish bruising to left cheek, no skin breakdown, non-tender to touch.  Eyes:     General:        Right eye: No discharge.        Left eye: No discharge.  Cardiovascular:     Rate and Rhythm: Normal rate and regular rhythm.     Pulses: Normal pulses.     Heart sounds: Normal heart sounds. No murmur heard. Pulmonary:     Effort: Pulmonary effort is normal. No respiratory distress.     Breath sounds: Normal breath sounds. No wheezing.  Chest:     Comments: No bruising or deformity to chest/sides. Tenderness noted when palpating left side.  Abdominal:     General: Bowel sounds are normal. There is no distension.     Palpations: Abdomen is soft.     Tenderness: There is no abdominal tenderness.  Musculoskeletal:     Cervical back: Normal range of motion and neck supple.     Right lower leg: No edema.     Left lower leg: No edema.     Comments: Full range of motion of left upper extremity  Skin:    General: Skin is warm and dry.     Capillary Refill: Capillary refill takes less than 2 seconds.     Comments: Small skin tear to left elbow, CDI, no bruising/tenderness/swelling.   Neurological:     General: No focal deficit present.     Mental Status: She is alert. Mental status is at baseline.     Motor: Weakness present.     Gait: Gait abnormal.     Comments: walker  Psychiatric:        Mood and Affect: Mood normal.        Behavior: Behavior normal.        Cognition and Memory: Memory is impaired.     Comments: Follows commands, pleasant    Labs reviewed: Recent Labs    05/11/20 0532 05/12/20 0525 05/13/20 0516  05/14/20 0440 05/29/20 0000 08/02/20 0000 12/05/20 0000 02/14/21 0000 02/15/21 0000  NA 139 141 139 139   < > 141 135* 138  --   K 3.7 3.5 3.4* 4.0   < > 4.8 5.0 3.6  --   CL 103 103 102 104   < > 104 99 102  --   CO2 26 27 26 25    < > 30* 29* 29*  --   GLUCOSE 88 87 113* 80  --   --   --   --   --   BUN 13 13 13  14   < > 13 14 32*  --   CREATININE 0.96 1.09* 1.11* 1.08*   < > 1.0 1.1 2.2*  --   CALCIUM 8.8* 8.7* 9.0 9.0   < > 9.3 9.5 11.6* 11.9*  MG 2.2 2.2 2.1  --   --   --   --   --   --   PHOS 3.9 3.5 3.4  --   --   --   --   --   --    < > = values in this interval not displayed.   Recent Labs    05/08/20 0446 05/10/20 0511 05/13/20 0516 05/29/20 0000 06/06/20 0000 12/05/20 0000 02/14/21 0000  AST 30 21 18    < > 16 11* 16  ALT 64* 38 27   < > 13 3* 9  ALKPHOS 152* 141* 112   < > 68 71 77  BILITOT 0.6 0.6 0.5  --   --   --   --   PROT 5.7* 5.7* 6.5  --   --   --   --   ALBUMIN 2.4* 2.3* 2.7*   < > 3.5 3.6 3.5   < > = values in this interval not displayed.   Recent Labs    05/02/20 2225 05/06/20 1937 05/08/20 0704 05/10/20 0511 05/12/20 0525 05/13/20 0516 05/29/20 0000 12/05/20 0000  WBC 7.3 6.9   < > 6.4 7.5 7.3 5.5 6.9  NEUTROABS 6.5 5.3  --   --   --   --   --  4,444.00  HGB 11.4* 11.3*   < > 10.5* 10.9* 11.9* 12.5 12.8  HCT 36.1 35.3*   < > 32.7* 33.9* 36.4 37 39  MCV 93.0 93.1   < > 92.1 92.9 92.9  --   --   PLT 132* 117*   < > 228 242 266 206 229   < > = values in this interval not displayed.   Lab Results  Component Value Date   TSH 6.72 (A) 02/15/2021   No results found for: HGBA1C No results found for: CHOL, HDL, LDLCALC, LDLDIRECT, TRIG, CHOLHDL  Significant Diagnostic Results in last 30 days:  No results found.  Assessment/Plan 1. Fall, initial encounter - 09/28 staff noted left elbow skin tear and pain when taking deep breath - cont PT/OT - cont falls safety precautions  2. Left-sided chest pain - left sided chest pain with  palpation - reports pain when taking deep breath - tylenol 1000 mg po bid x 14 days - CXR- negative for acute fracture  3. Somnolence - improved- more alert, eating well  4. Late onset Alzheimer's dementia with behavioral disturbance (HCC) - no recent behavioral outbursts - cont aricept and namenda  5. Depression with anxiety - stable with recently reduced depakote and Seroquel  6. Stage 3a chronic kidney disease (HCC) - BUN/creat 21/1.60 02/19/2021  7. Hypercalcemia - calcium 9.5 02/19/2021 - calcium discontinued last week    Family/ staff Communication: plan discussed with nurse  Labs/tests ordered:  none

## 2021-02-21 MED ORDER — ACETAMINOPHEN 500 MG PO TABS: 1000.0000 mg | ORAL_TABLET | Freq: Two times a day (BID) | ORAL | 0 refills | Status: AC

## 2021-02-21 NOTE — Addendum Note (Signed)
Addended byHazle Nordmann E on: 02/21/2021 05:10 PM   Modules accepted: Orders

## 2021-02-25 ENCOUNTER — Non-Acute Institutional Stay (SKILLED_NURSING_FACILITY): Payer: Medicare Other | Admitting: Nurse Practitioner

## 2021-02-25 ENCOUNTER — Encounter: Payer: Self-pay | Admitting: Nurse Practitioner

## 2021-02-25 DIAGNOSIS — N1831 Chronic kidney disease, stage 3a: Secondary | ICD-10-CM | POA: Diagnosis not present

## 2021-02-25 DIAGNOSIS — F418 Other specified anxiety disorders: Secondary | ICD-10-CM | POA: Diagnosis not present

## 2021-02-25 DIAGNOSIS — F01C18 Vascular dementia, severe, with other behavioral disturbance: Secondary | ICD-10-CM | POA: Diagnosis not present

## 2021-02-25 NOTE — Progress Notes (Addendum)
Location:   [facility] Room Number: 103 A Place of Service:  SNF (31) Provider: [facility] Lateefah Mallery NP  [name], MD  Patient Care Team: [name], MD as PCP - General (Internal Medicine)  Extended Emergency Contact Information Primary Emergency Contact: Ramiro Harvest, [facility]) [facility] Address: [address]          [place], [place] [address] [name] of [place] Phone: [phone] Relation: Relative Secondary Emergency Contact: Marlowe Kays Mobile Phone: [phone] Relation: Relative  Code Status:  DNR Goals of care: Advanced Directive information Advanced Directives 02/25/2021  Does Patient Have a Medical Advance Directive? Yes  Type of Estate agent of [place];Living will;Out of facility DNR (pink MOST or yellow form)  Does patient want to make changes to medical advance directive? No - Patient declined  Copy of Healthcare Power of Attorney in Chart? Yes - validated most recent copy scanned in chart (See row information)  Pre-existing out of facility DNR order (yellow form or pink MOST form) -     Chief Complaint  Patient presents with  . Medical Management of Chronic Issues    Routine follow up visit.     HPI:  Pt is a 87 y.o. female seen today for medical management of chronic diseases.    Serum Ca 11.9 02/15/21,  off Ca supplement, PTH<10  Dementia/behavioral issues, Depakote and Seroquel was decreased 02/13/21 due to sleepiness. On Namenda, Donepezil.   Depression/anxiety, takes Lexpro, Lorazepam, Clonazepam, emotional outburst noted. CKD s/p IVF 02/13/21, Bun/creat 32/2.2 02/14/21       Past Medical History:  Diagnosis Date  . Arthritis   . Dementia (HCC)   . Hx of concussion    [facility]  . Hyperlipidemia   . Vitamin D deficiency, unspecified    Past Surgical History:  Procedure Laterality Date  . CHOLECYSTECTOMY      Allergies  Allergen Reactions  . Fish-Derived Products   . Pravachol  [Pravastatin Sodium]     Allergies as of 02/25/2021       Reactions   Fish-derived Products    Pravachol [pravastatin Sodium]         Medication List        Accurate as of February 25, 2021 11:59 PM. If you have any questions, ask your nurse or doctor.          acetaminophen 500 MG tablet Commonly known as: TYLENOL Take 2 tablets (1,000 mg total) by mouth in the morning and at bedtime for 14 days.   clonazePAM 0.5 MG tablet Commonly known as: KLONOPIN Take 1 tablet (0.5 mg total) by mouth at bedtime.   divalproex 250 MG DR tablet Commonly known as: DEPAKOTE Take 125 mg by mouth daily.   donepezil 5 MG tablet Commonly known as: ARICEPT Take 5 mg by mouth 2 (two) times daily.   escitalopram 10 MG tablet Commonly known as: LEXAPRO Take 10 mg by mouth daily.   LORazepam 0.5 MG tablet Commonly known as: ATIVAN Take 0.5 mg by mouth daily as needed for anxiety.   memantine 10 MG tablet Commonly known as: NAMENDA Take 10 mg by mouth 2 (two) times daily.   POLYVINYL ALCOHOL-POVIDONE OP Place 2 drops into both eyes 3 (three) times daily. drops; 0.5-0.6 %; amt: 2 drops each eye; ophthalmic (eye)   QUEtiapine 25 MG tablet Commonly known as: SEROQUEL Take 25 mg by mouth 2 (two) times daily.        Review of Systems  Constitutional:  Negative for activity change, appetite change and fever.  HENT:  Positive for hearing loss. Negative for congestion and voice change.   Eyes:  Negative for visual disturbance.       Dry eyes  Respiratory:  Negative for cough.   Cardiovascular:  Positive for leg swelling.  Gastrointestinal:  Negative for abdominal pain and constipation.  Genitourinary:  Negative for dysuria and urgency.  Musculoskeletal:  Positive for arthralgias and gait problem.       Gait has improved, rails or furniture walking sometimes.   Skin:  Negative for color change.  Neurological:  Negative for speech difficulty, weakness and light-headedness.        Dementia  Psychiatric/Behavioral:  Positive for agitation, behavioral problems and confusion. Negative for sleep disturbance. The patient is nervous/anxious.        Determined to leave for home in [facility], unable to redirect   Immunization History  Administered Date(s) Administered  . Influenza-Unspecified 02/26/2019, 03/07/2020  . Moderna Sars-Covid-2 Vaccination 05/28/2019, 06/25/2019, 04/03/2020, 10/23/2020  . Pneumococcal-Unspecified 05/22/2016  . Tdap 01/13/2021   Pertinent  Health Maintenance Due  Topic Date Due  . DEXA SCAN  Never done  . INFLUENZA VACCINE  12/24/2020   No flowsheet data found. Functional Status Survey:    Vitals:   02/25/21 1652  BP: 120/68  Pulse: 68  Resp: 20  Temp: 98.4 F (36.9 C)  SpO2: 97%  Weight: 151 lb 9.6 oz (68.8 kg)  Height: 5\' 6"  (1.676 m)   Body mass index is 24.47 kg/m. Physical Exam Vitals and nursing note reviewed.  Constitutional:      Appearance: Normal appearance.  HENT:     Head: Normocephalic and atraumatic.     Mouth/Throat:     Mouth: Mucous membranes are moist.  Eyes:     Extraocular Movements: Extraocular movements intact.     Conjunctiva/sclera: Conjunctivae normal.     Pupils: Pupils are equal, round, and reactive to light.  Cardiovascular:     Rate and Rhythm: Normal rate and regular rhythm.     Heart sounds: No murmur heard. Pulmonary:     Effort: Pulmonary effort is normal.     Breath sounds: No rales.  Abdominal:     Palpations: Abdomen is soft.     Tenderness: There is no abdominal tenderness.  Musculoskeletal:     Right lower leg: Edema present.     Left lower leg: Edema present.     Comments: Minimal swelling in ankles.   Skin:    General: Skin is warm and dry.     Findings: Bruising present.     Comments: Left parietal scalp laceration is healed, residual ecchymosis left face  Neurological:     General: No focal deficit present.     Mental Status: She is alert.     Gait: Gait abnormal.      Comments: Oriented to person  Psychiatric:     Comments: Confused, pleasant, conversing.     Labs reviewed: Recent Labs    05/11/20 0532 05/12/20 0525 05/13/20 0516 05/14/20 0440 05/29/20 0000 08/02/20 0000 12/05/20 0000 02/14/21 0000 02/15/21 0000  NA 139 141 139 139   < > 141 135* 138  --   K 3.7 3.5 3.4* 4.0   < > 4.8 5.0 3.6  --   CL 103 103 102 104   < > 104 99 102  --   CO2 26 27 26 25    < > 30* 29* 29*  --  GLUCOSE 88 87 113* 80  --   --   --   --   --   BUN 13 13 13 14    < > 13 14 32*  --   CREATININE 0.96 1.09* 1.11* 1.08*   < > 1.0 1.1 2.2*  --   CALCIUM 8.8* 8.7* 9.0 9.0   < > 9.3 9.5 11.6* 11.9*  MG 2.2 2.2 2.1  --   --   --   --   --   --   PHOS 3.9 3.5 3.4  --   --   --   --   --   --    < > = values in this interval not displayed.   Recent Labs    05/08/20 0446 05/10/20 0511 05/13/20 0516 05/29/20 0000 06/06/20 0000 12/05/20 0000 02/14/21 0000  AST 30 21 18    < > 16 11* 16  ALT 64* 38 27   < > 13 3* 9  ALKPHOS 152* 141* 112   < > 68 71 77  BILITOT 0.6 0.6 0.5  --   --   --   --   PROT 5.7* 5.7* 6.5  --   --   --   --   ALBUMIN 2.4* 2.3* 2.7*   < > 3.5 3.6 3.5   < > = values in this interval not displayed.   Recent Labs    05/02/20 2225 05/06/20 1937 05/08/20 0704 05/10/20 0511 05/12/20 0525 05/13/20 0516 05/29/20 0000 12/05/20 0000  WBC 7.3 6.9   < > 6.4 7.5 7.3 5.5 6.9  NEUTROABS 6.5 5.3  --   --   --   --   --  4,444.00  HGB 11.4* 11.3*   < > 10.5* 10.9* 11.9* 12.5 12.8  HCT 36.1 35.3*   < > 32.7* 33.9* 36.4 37 39  MCV 93.0 93.1   < > 92.1 92.9 92.9  --   --   PLT 132* 117*   < > 228 242 266 206 229   < > = values in this interval not displayed.   Lab Results  Component Value Date   TSH 6.72 (A) 02/15/2021   No results found for: HGBA1C No results found for: CHOL, HDL, LDLCALC, LDLDIRECT, TRIG, CHOLHDL  Significant Diagnostic Results in last 30 days:  No results found.  Assessment/Plan  Depression with anxiety  takes  Lexpro, Lorazepam, Clonazepam, emotional outburst noted, Lorazepam 1mg  IM stat, may repeat w/i one hour if needed. Increase Lorazepam 0.5mg  to q8hr if needed.   CKD (chronic kidney disease) stage 3, GFR 30-59 ml/min (HCC) s/p IVF 02/13/21, Bun/creat 32/2.2 02/14/21, improved oral intake since Depakote/Seroquel decreased  Vascular dementia (HCC) Depakote and Seroquel was decreased 02/13/21 due to sleepiness. On Namenda, Donepezil.   Hypercalcemia Serum Ca 11.9 02/15/21,  off Ca supplement, PTH<10   Family/ staff Communication: plan of care reviewed with the patient and charge nurse.   Labs/tests ordered:  none  Time spend 35 minutes.

## 2021-02-25 NOTE — Assessment & Plan Note (Signed)
Serum Ca 11.9 02/15/21,  off Ca supplement, PTH<10

## 2021-02-25 NOTE — Assessment & Plan Note (Addendum)
takes Lexpro, Lorazepam, Clonazepam, emotional outburst noted-refusing meds/care assistance, combative, exit seeking, aggressive, Lorazepam 1mg  IM stat, may repeat w/i one hour if needed. Increase Lorazepam 0.5mg  to q8hr if needed. May have to increase Depakote to 125mg  bid if behaviors persist.

## 2021-02-25 NOTE — Assessment & Plan Note (Signed)
s/p IVF 02/13/21, Bun/creat 32/2.2 02/14/21, improved oral intake since Depakote/Seroquel decreased

## 2021-02-25 NOTE — Assessment & Plan Note (Signed)
Depakote and Seroquel was decreased 02/13/21 due to sleepiness. On Namenda, Donepezil.

## 2021-02-26 ENCOUNTER — Encounter: Payer: Self-pay | Admitting: Nurse Practitioner

## 2021-03-27 ENCOUNTER — Other Ambulatory Visit: Payer: Self-pay | Admitting: Orthopedic Surgery

## 2021-03-27 DIAGNOSIS — F418 Other specified anxiety disorders: Secondary | ICD-10-CM

## 2021-03-27 DIAGNOSIS — F01C18 Vascular dementia, severe, with other behavioral disturbance: Secondary | ICD-10-CM

## 2021-03-27 MED ORDER — CLONAZEPAM 0.5 MG PO TABS: 0.5000 mg | ORAL_TABLET | Freq: Every day | ORAL | 0 refills | Status: DC

## 2021-04-02 ENCOUNTER — Non-Acute Institutional Stay (SKILLED_NURSING_FACILITY): Payer: Medicare Other | Admitting: Internal Medicine

## 2021-04-02 ENCOUNTER — Encounter: Payer: Self-pay | Admitting: Internal Medicine

## 2021-04-02 DIAGNOSIS — F418 Other specified anxiety disorders: Secondary | ICD-10-CM

## 2021-04-02 DIAGNOSIS — F02818 Dementia in other diseases classified elsewhere, unspecified severity, with other behavioral disturbance: Secondary | ICD-10-CM

## 2021-04-02 DIAGNOSIS — N289 Disorder of kidney and ureter, unspecified: Secondary | ICD-10-CM

## 2021-04-02 DIAGNOSIS — G301 Alzheimer's disease with late onset: Secondary | ICD-10-CM

## 2021-04-02 NOTE — Progress Notes (Signed)
Location:   [facility] Room Number: 103 Place of Service:  SNF [phone]) Provider:  Einar Crow MD   [name], MD  Patient Care Team: [name], MD as PCP - General (Internal Medicine)  Extended Emergency Contact Information Primary Emergency Contact: Ramiro Harvest, [facility]) [facility] Address: [address]          [place], [place] [address] [name] of [place] Phone: [phone] Relation: Relative Secondary Emergency Contact: Marlowe Kays Mobile Phone: [phone] Relation: Relative  Code Status:  DNR Goals of care: Advanced Directive information Advanced Directives 04/02/2021  Does Patient Have a Medical Advance Directive? Yes  Type of Estate agent of [place];Living will;Out of facility DNR (pink MOST or yellow form)  Does patient want to make changes to medical advance directive? No - Patient declined  Copy of Healthcare Power of Attorney in Chart? Yes - validated most recent copy scanned in chart (See row information)  Pre-existing out of facility DNR order (yellow form or pink MOST form) Yellow form placed in chart (order not valid for inpatient use)     Chief Complaint  Patient presents with   Medical Management of Chronic Issues   Quality Metric Gaps    Shingrix, PCV, Dexa scan    HPI:  Pt is a 87 y.o. female seen today for medical management of chronic diseases.    Patient has Dementia and Lives in Memory Unit  CT Scan shows Moderate severity cerebral atrophy and microvascular disease  Also h/o  Intraabdominal Abscess/ Liver abscess IR was consulted. Underwent Liver Abscess Aspiration on 05/07/20 Hypercalcemia Due to dehydration and PO calcium. PTH was low  Lives in Memory unit Has lost some weigh t Per Nursing notes continues to have agitation sometimes and exit seeking Works with Therapy and wlaks with the walker and mild assist No Acute issues today   Past Medical History:  Diagnosis Date    Arthritis    Dementia (HCC)    Hx of concussion    [facility]   Hyperlipidemia    Vitamin D deficiency, unspecified    Past Surgical History:  Procedure Laterality Date   CHOLECYSTECTOMY      Allergies  Allergen Reactions   Fish-Derived Products    Pravachol [Pravastatin Sodium]     Allergies as of 04/02/2021       Reactions   Fish-derived Products    Pravachol [pravastatin Sodium]         Medication List        Accurate as of April 02, 2021  2:39 PM. If you have any questions, ask your nurse or doctor.          clonazePAM 0.5 MG tablet Commonly known as: KLONOPIN Take 1 tablet (0.5 mg total) by mouth at bedtime.   divalproex 250 MG DR tablet Commonly known as: DEPAKOTE Take 125 mg by mouth daily.   donepezil 5 MG tablet Commonly known as: ARICEPT Take 5 mg by mouth 2 (two) times daily.   escitalopram 10 MG tablet Commonly known as: LEXAPRO Take 10 mg by mouth daily.   hydrOXYzine 25 MG capsule Commonly known as: VISTARIL Take 25 mg by mouth at bedtime.   LORazepam 0.5 MG tablet Commonly known as: ATIVAN Take 0.5 mg by mouth daily as needed for anxiety.   memantine 10 MG tablet Commonly known as: NAMENDA Take 10 mg by mouth 2 (two) times daily.   POLYVINYL ALCOHOL-POVIDONE OP Place 2 drops into  both eyes 3 (three) times daily. drops; 0.5-0.6 %; amt: 2 drops each eye; ophthalmic (eye)   QUEtiapine 25 MG tablet Commonly known as: SEROQUEL Take 25 mg by mouth 2 (two) times daily.        Review of Systems  Unable to perform ROS: Dementia   Immunization History  Administered Date(s) Administered   Influenza-Unspecified 02/26/2019, 03/07/2020, 03/13/2021   Moderna Sars-Covid-2 Vaccination 05/28/2019, 06/25/2019, 04/03/2020, 10/23/2020   PFIZER(Purple Top)SARS-COV-2 Vaccination 02/12/2021   Pneumococcal-Unspecified 05/22/2016   Tdap 01/13/2021   Pertinent  Health Maintenance Due  Topic Date Due   DEXA SCAN  Never  done   INFLUENZA VACCINE  Completed   Fall Risk 05/13/2020 05/13/2020 05/14/2020 05/22/2020 01/13/2021  Patient Fall Risk Level High fall risk High fall risk High fall risk High fall risk Moderate fall risk   Functional Status Survey:    Vitals:   04/02/21 1431  BP: 132/70  Pulse: 76  Resp: 18  Temp: 98.7 F (37.1 C)  SpO2: 95%  Weight: 151 lb 3.2 oz (68.6 kg)  Height: 5\' 6"  (1.676 m)   Body mass index is 24.4 kg/m. Physical Exam Vitals reviewed.  Constitutional:      Appearance: Normal appearance.  HENT:     Head: Normocephalic.     Nose: Nose normal.     Mouth/Throat:     Mouth: Mucous membranes are moist.     Pharynx: Oropharynx is clear.  Eyes:     Pupils: Pupils are equal, round, and reactive to light.  Cardiovascular:     Rate and Rhythm: Normal rate and regular rhythm.     Pulses: Normal pulses.  Pulmonary:     Effort: Pulmonary effort is normal.     Breath sounds: Normal breath sounds.  Abdominal:     General: Abdomen is flat. Bowel sounds are normal.     Palpations: Abdomen is soft.  Musculoskeletal:        General: No swelling.     Cervical back: Neck supple.  Skin:    General: Skin is warm.  Neurological:     General: No focal deficit present.     Mental Status: She is alert.  Psychiatric:        Mood and Affect: Mood normal.        Thought Content: Thought content normal.    Labs reviewed: Recent Labs    05/11/20 0532 05/12/20 0525 05/13/20 0516 05/14/20 0440 05/29/20 0000 12/05/20 0000 02/14/21 0000 02/15/21 0000 02/20/21 0000  NA 139 141 139 139   < > 135* 138  --  144  K 3.7 3.5 3.4* 4.0   < > 5.0 3.6  --  3.7  CL 103 103 102 104   < > 99 102  --  106  CO2 26 27 26 25    < > 29* 29*  --  30*  GLUCOSE 88 87 113* 80  --   --   --   --   --   BUN 13 13 13 14    < > 14 32*  --  21  CREATININE 0.96 1.09* 1.11* 1.08*   < > 1.1 2.2*  --  1.6*  CALCIUM 8.8* 8.7* 9.0 9.0   < > 9.5 11.6* 11.9* 9.5  MG 2.2 2.2 2.1  --   --   --   --   --    --   PHOS 3.9 3.5 3.4  --   --   --   --   --   --    < > =  values in this interval not displayed.   Recent Labs    05/08/20 0446 05/10/20 0511 05/13/20 0516 05/29/20 0000 12/05/20 0000 02/14/21 0000 02/20/21 0000  AST 30 21 18    < > 11* 16 12*  ALT 64* 38 27   < > 3* 9 8  ALKPHOS 152* 141* 112   < > 71 77 72  BILITOT 0.6 0.6 0.5  --   --   --   --   PROT 5.7* 5.7* 6.5  --   --   --   --   ALBUMIN 2.4* 2.3* 2.7*   < > 3.6 3.5 3.5   < > = values in this interval not displayed.   Recent Labs    05/02/20 2225 05/06/20 1937 05/08/20 0704 05/10/20 0511 05/12/20 0525 05/13/20 0516 05/29/20 0000 12/05/20 0000  WBC 7.3 6.9   < > 6.4 7.5 7.3 5.5 6.9  NEUTROABS 6.5 5.3  --   --   --   --   --  4,444.00  HGB 11.4* 11.3*   < > 10.5* 10.9* 11.9* 12.5 12.8  HCT 36.1 35.3*   < > 32.7* 33.9* 36.4 37 39  MCV 93.0 93.1   < > 92.1 92.9 92.9  --   --   PLT 132* 117*   < > 228 242 266 206 229   < > = values in this interval not displayed.   Lab Results  Component Value Date   TSH 6.72 (A) 02/15/2021   No results found for: HGBA1C No results found for: CHOL, HDL, LDLCALC, LDLDIRECT, TRIG, CHOLHDL  Significant Diagnostic Results in last 30 days:  No results found.  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

## 2021-04-26 ENCOUNTER — Non-Acute Institutional Stay (SKILLED_NURSING_FACILITY): Payer: Medicare Other | Admitting: Nurse Practitioner

## 2021-04-26 ENCOUNTER — Encounter: Payer: Self-pay | Admitting: Nurse Practitioner

## 2021-04-26 DIAGNOSIS — R7989 Other specified abnormal findings of blood chemistry: Secondary | ICD-10-CM

## 2021-04-26 DIAGNOSIS — N1831 Chronic kidney disease, stage 3a: Secondary | ICD-10-CM

## 2021-04-26 DIAGNOSIS — F418 Other specified anxiety disorders: Secondary | ICD-10-CM

## 2021-04-26 DIAGNOSIS — F01C18 Vascular dementia, severe, with other behavioral disturbance: Secondary | ICD-10-CM | POA: Diagnosis not present

## 2021-04-26 NOTE — Progress Notes (Signed)
Location:   [facility] Room Number: [place]:  SNF (31) Provider:  Marda Stalker, Lennie Odor NP   [name], MD  Patient Care Team: [name], MD as PCP - General (Internal Medicine)  Extended Emergency Contact Information Primary Emergency Contact: [place] [facility] Address: [address]          [place], [place] [address] [name] of [place] Phone: [phone] Relation: Relative Secondary Emergency Contact: [name] Mobile Phone: [phone] Relation: Relative  Code Status:  DNR Goals of care: Advanced Directive information Advanced Directives 04/26/2021  Does Patient Have a Medical Advance Directive? Yes  Type of Paramedic of [place];Living will;Out of facility DNR (pink MOST or yellow form)  Does patient want to make changes to medical advance directive? No - Patient declined  Copy of [place] in Chart? Yes - validated most recent copy scanned in chart (See row information)  Pre-existing out of facility DNR order (yellow form or pink MOST form) Yellow form placed in chart (order not valid for inpatient use)     Chief Complaint  Patient presents with   Medical Management of Chronic Issues   Quality Metric Gaps    Zoster Vaccines- Shingrix (1 of 2)   Pneumonia Vaccine 84+ Years old (1 - PCV) DEXA SCAN       HPI:  Pt is a 87 y.o. female seen today for medical management of chronic diseases.     Serum Ca 11.9 02/15/21>>9.5 02/20/21,  off Ca supplement, PTH<10             Dementia/behavioral issues, takes Depakote and Seroquel. Vistaril hs. On Namenda, Donepezil.              Depression/anxiety, takes Lexpro, Clonazepam, Lorazepam, less emotional outburst noted. CKD stable.  Elevated TSH stable.                  Past Medical History:  Diagnosis Date   Arthritis    Dementia ([place])    Hx of concussion    [place]   Hyperlipidemia     Vitamin D deficiency, unspecified    Past Surgical History:  Procedure Laterality Date   CHOLECYSTECTOMY      Allergies  Allergen Reactions   Fish-Derived Products    Pravachol [Pravastatin Sodium]     Allergies as of 04/26/2021       Reactions   Fish-derived Products    Pravachol [pravastatin Sodium]         Medication List        Accurate as of April 26, 2021 11:59 PM. If you have any questions, ask your nurse or doctor.          clonazePAM 0.5 MG tablet Commonly known as: KLONOPIN Take 1 tablet (0.5 mg total) by mouth at bedtime.   divalproex 250 MG DR tablet Commonly known as: DEPAKOTE Take 125 mg by mouth daily.   donepezil 5 MG tablet Commonly known as: ARICEPT Take 5 mg by mouth 2 (two) times daily.   escitalopram 10 MG tablet Commonly known as: LEXAPRO Take 10 mg by mouth daily.   hydrOXYzine 25 MG capsule Commonly known as: VISTARIL Take 25 mg by mouth at bedtime.   LORazepam 0.5 MG tablet Commonly known as: ATIVAN Take 0.5 mg by mouth daily as needed for anxiety.   memantine 10 MG tablet Commonly known as: NAMENDA Take 10 mg by mouth 2 (two) times  daily.   POLYVINYL ALCOHOL-POVIDONE OP Place 2 drops into both eyes 3 (three) times daily. drops; 0.5-0.6 %; amt: 2 drops each eye; ophthalmic (eye)   QUEtiapine 25 MG tablet Commonly known as: SEROQUEL Take 25 mg by mouth 2 (two) times daily.        Review of Systems  Constitutional:  Negative for fatigue, fever and unexpected weight change.  HENT:  Positive for hearing loss. Negative for congestion and voice change.   Eyes:  Negative for visual disturbance.       Dry eyes  Respiratory:  Negative for cough.   Cardiovascular:  Negative for leg swelling.  Gastrointestinal:  Negative for abdominal pain and constipation.  Genitourinary:  Negative for dysuria and urgency.  Musculoskeletal:  Positive for arthralgias and gait problem. Negative for myalgias.       Gait has improved, rails  or furniture walking sometimes.   Skin:  Negative for color change.  Neurological:  Negative for speech difficulty, weakness and headaches.       Dementia  Psychiatric/Behavioral:  Positive for agitation, behavioral problems and confusion. Negative for sleep disturbance. The patient is nervous/anxious.        Less emotional, behavioral outbursts    Immunization History  Administered Date(s) Administered   Influenza-Unspecified 02/26/2019, 03/07/2020, 03/13/2021   Moderna Sars-Covid-2 Vaccination 05/28/2019, 06/25/2019, 04/03/2020, 10/23/2020   PFIZER(Purple Top)SARS-COV-2 Vaccination 02/12/2021   Pneumococcal-Unspecified 05/22/2016   Tdap 01/13/2021   Pertinent  Health Maintenance Due  Topic Date Due   DEXA SCAN  Never done   INFLUENZA VACCINE  Completed   Fall Risk 05/13/2020 05/13/2020 05/14/2020 05/22/2020 01/13/2021  Patient Fall Risk Level High fall risk High fall risk High fall risk High fall risk Moderate fall risk   Functional Status Survey:    Vitals:   04/26/21 1549  BP: 138/68  Pulse: 76  Resp: 16  Temp: 98.1 F (36.7 C)  SpO2: 90%  Weight: 149 lb 3.2 oz (67.7 kg)  Height: 5\' 6"  (1.676 m)   Body mass index is 24.08 kg/m. Physical Exam Vitals and nursing note reviewed.  Constitutional:      Appearance: Normal appearance.  HENT:     Head: Normocephalic and atraumatic.     Mouth/Throat:     Mouth: Mucous membranes are moist.  Eyes:     Extraocular Movements: Extraocular movements intact.     Conjunctiva/sclera: Conjunctivae normal.     Pupils: Pupils are equal, round, and reactive to light.  Cardiovascular:     Rate and Rhythm: Normal rate and regular rhythm.     Heart sounds: No murmur heard. Pulmonary:     Effort: Pulmonary effort is normal.     Breath sounds: No rales.  Abdominal:     Palpations: Abdomen is soft.     Tenderness: There is no abdominal tenderness.  Musculoskeletal:     Right lower leg: No edema.     Left lower leg: No edema.   Skin:    General: Skin is warm and dry.  Neurological:     General: No focal deficit present.     Mental Status: She is alert.     Gait: Gait abnormal.     Comments: Oriented to person  Psychiatric:     Comments: Confused, pleasant, conversing.     Labs reviewed: Recent Labs    05/11/20 0532 05/12/20 0525 05/13/20 0516 05/14/20 0440 05/29/20 0000 12/05/20 0000 02/14/21 0000 02/15/21 0000 02/20/21 0000  NA 139 141 139 139   < >  135* 138  --  144  K 3.7 3.5 3.4* 4.0   < > 5.0 3.6  --  3.7  CL 103 103 102 104   < > 99 102  --  106  CO2 26 27 26 25    < > 29* 29*  --  30*  GLUCOSE 88 87 113* 80  --   --   --   --   --   BUN 13 13 13 14    < > 14 32*  --  21  CREATININE 0.96 1.09* 1.11* 1.08*   < > 1.1 2.2*  --  1.6*  CALCIUM 8.8* 8.7* 9.0 9.0   < > 9.5 11.6* 11.9* 9.5  MG 2.2 2.2 2.1  --   --   --   --   --   --   PHOS 3.9 3.5 3.4  --   --   --   --   --   --    < > = values in this interval not displayed.   Recent Labs    05/08/20 0446 05/10/20 0511 05/13/20 0516 05/29/20 0000 12/05/20 0000 02/14/21 0000 02/20/21 0000  AST 30 21 18    < > 11* 16 12*  ALT 64* 38 27   < > 3* 9 8  ALKPHOS 152* 141* 112   < > 71 77 72  BILITOT 0.6 0.6 0.5  --   --   --   --   PROT 5.7* 5.7* 6.5  --   --   --   --   ALBUMIN 2.4* 2.3* 2.7*   < > 3.6 3.5 3.5   < > = values in this interval not displayed.   Recent Labs    05/02/20 2225 05/06/20 1937 05/08/20 0704 05/10/20 0511 05/12/20 0525 05/13/20 0516 05/29/20 0000 12/05/20 0000  WBC 7.3 6.9   < > 6.4 7.5 7.3 5.5 6.9  NEUTROABS 6.5 5.3  --   --   --   --   --  4,444.00  HGB 11.4* 11.3*   < > 10.5* 10.9* 11.9* 12.5 12.8  HCT 36.1 35.3*   < > 32.7* 33.9* 36.4 37 39  MCV 93.0 93.1   < > 92.1 92.9 92.9  --   --   PLT 132* 117*   < > 228 242 266 206 229   < > = values in this interval not displayed.   Lab Results  Component Value Date   TSH 6.72 (A) 02/15/2021   No results found for: HGBA1C No results found for: CHOL, HDL,  LDLCALC, LDLDIRECT, TRIG, CHOLHDL  Significant Diagnostic Results in last 30 days:  No results found.  Assessment/Plan CKD (chronic kidney disease) stage 3, GFR 30-59 ml/min (HCC) Stable.   Elevated TSH Stable.   Depression with anxiety  takes Lexpro, Clonazepam, Lorazepam, less emotional outburst noted.  Vascular dementia (HCC) Stabilizing, takes Depakote and Seroquel. Vistaril hs. On Namenda, Donepezil.  Hypercalcemia Ca 11.9 02/15/21>>9.5 02/20/21,  off Ca supplement, PTH<10    Family/ staff Communication: plan of care reviewed with the patient and charge nurse.   Labs/tests ordered:  none  Time spend 35 minutes.

## 2021-04-29 ENCOUNTER — Other Ambulatory Visit: Payer: Self-pay | Admitting: *Deleted

## 2021-04-29 ENCOUNTER — Other Ambulatory Visit: Payer: Self-pay | Admitting: Orthopedic Surgery

## 2021-04-29 DIAGNOSIS — F418 Other specified anxiety disorders: Secondary | ICD-10-CM

## 2021-04-29 DIAGNOSIS — F01C18 Vascular dementia, severe, with other behavioral disturbance: Secondary | ICD-10-CM

## 2021-04-29 MED ORDER — CLONAZEPAM 0.5 MG PO TABS: 0.5000 mg | ORAL_TABLET | Freq: Every day | ORAL | 0 refills | Status: DC

## 2021-04-29 MED ORDER — CLONAZEPAM 0.5 MG PO TABS: 0.5000 mg | ORAL_TABLET | Freq: Every day | ORAL | 5 refills | Status: DC

## 2021-04-29 NOTE — Telephone Encounter (Signed)
Received fax from [facility] and sent to Amy for approval.

## 2021-04-30 ENCOUNTER — Encounter: Payer: Self-pay | Admitting: Nurse Practitioner

## 2021-04-30 NOTE — Assessment & Plan Note (Signed)
Stable

## 2021-04-30 NOTE — Assessment & Plan Note (Signed)
takes Lexpro, Clonazepam, Lorazepam, less emotional outburst noted. °

## 2021-04-30 NOTE — Assessment & Plan Note (Signed)
Stabilizing, takes Depakote and Seroquel. Vistaril hs. On Namenda, Donepezil.

## 2021-04-30 NOTE — Assessment & Plan Note (Signed)
Ca 11.9 02/15/21>>9.5 02/20/21,  off Ca supplement, PTH<10

## 2021-04-30 NOTE — Assessment & Plan Note (Addendum)
Stable

## 2021-05-09 ENCOUNTER — Encounter: Payer: Self-pay | Admitting: Nurse Practitioner

## 2021-05-09 ENCOUNTER — Non-Acute Institutional Stay (SKILLED_NURSING_FACILITY): Payer: Medicare Other | Admitting: Nurse Practitioner

## 2021-05-09 DIAGNOSIS — R2681 Unsteadiness on feet: Secondary | ICD-10-CM

## 2021-05-09 DIAGNOSIS — R7989 Other specified abnormal findings of blood chemistry: Secondary | ICD-10-CM

## 2021-05-09 DIAGNOSIS — W19XXXA Unspecified fall, initial encounter: Secondary | ICD-10-CM | POA: Diagnosis not present

## 2021-05-09 DIAGNOSIS — F418 Other specified anxiety disorders: Secondary | ICD-10-CM

## 2021-05-09 DIAGNOSIS — F01C18 Vascular dementia, severe, with other behavioral disturbance: Secondary | ICD-10-CM

## 2021-05-09 NOTE — Assessment & Plan Note (Signed)
takes Depakote and Seroquel. Vistaril hs. On Namenda, Donepezil.

## 2021-05-09 NOTE — Assessment & Plan Note (Signed)
Furniture or walker to ambulates.

## 2021-05-09 NOTE — Assessment & Plan Note (Signed)
Serum Ca 11.9 02/15/21>>9.5 02/20/21,  off Ca supplement, PTH<10, repeat CMP/eGFR

## 2021-05-09 NOTE — Assessment & Plan Note (Signed)
takes Lexpro, Clonazepam, Lorazepam, less emotional outburst noted.

## 2021-05-09 NOTE — Assessment & Plan Note (Signed)
TSH 6.71 02/15/21, will update TSH

## 2021-05-09 NOTE — Progress Notes (Signed)
Location:   [place] Room Number: [place] of Service:  SNF (31) Provider:  Amayra Kiedrowski X, NP  [name], MD  Patient Care Team: [name], MD as PCP - General (Internal Medicine)  Extended Emergency Contact Information Primary Emergency Contact: Bertrum Sol, [facility]) [facility] Address: [address]          [place], [place] [address] [name] of [place] Phone: [phone] Relation: Relative Secondary Emergency Contact: [name] Mobile Phone: [phone] Relation: Relative  Code Status:  DNR Goals of care: Advanced Directive information Advanced Directives 05/09/2021  Does Patient Have a Medical Advance Directive? Yes  Type of Paramedic of [place];Living will;Out of facility DNR (pink MOST or yellow form)  Does patient want to make changes to medical advance directive? No - Patient declined  Copy of [place] in Chart? Yes - validated most recent copy scanned in chart (See row information)  Pre-existing out of facility DNR order (yellow form or pink MOST form) Yellow form placed in chart (order not valid for inpatient use)     Chief Complaint  Patient presents with   Acute Visit    Acute visit for Fall.    HPI:  Pt is a 87 y.o. female seen today for an acute visit for unobserved fall 05/08/21 when the patient was found on the floor mat in her room, the patient stated she rolled out of bed, no apparent injury noted.    Serum Ca 11.9 02/15/21>>9.5 02/20/21,  off Ca supplement, PTH<10             Dementia/behavioral issues, takes Depakote and Seroquel. Vistaril hs. On Namenda, Donepezil.              Depression/anxiety, takes Lexpro, Clonazepam, Lorazepam, less emotional outburst noted. CKD stable.  Elevated TSH 6.72 02/15/21 Gait abnormality, furniture or walker, needs reminder for safety.   Past Medical History:  Diagnosis Date   Arthritis    Dementia ([place])    Hx of concussion     [place]   Hyperlipidemia    Vitamin D deficiency, unspecified    Past Surgical History:  Procedure Laterality Date   CHOLECYSTECTOMY      Allergies  Allergen Reactions   Fish-Derived Products    Pravachol [Pravastatin Sodium]     Allergies as of 05/09/2021       Reactions   Fish-derived Products    Pravachol [pravastatin Sodium]         Medication List        Accurate as of May 09, 2021 11:59 PM. If you have any questions, ask your nurse or doctor.          clonazePAM 0.5 MG tablet Commonly known as: KLONOPIN Take 1 tablet (0.5 mg total) by mouth at bedtime.   divalproex 250 MG DR tablet Commonly known as: DEPAKOTE Take 125 mg by mouth daily.   donepezil 5 MG tablet Commonly known as: ARICEPT Take 5 mg by mouth 2 (two) times daily.   escitalopram 10 MG tablet Commonly known as: LEXAPRO Take 10 mg by mouth daily.   hydrOXYzine 25 MG capsule Commonly known as: VISTARIL Take 25 mg by mouth at bedtime.   LORazepam 0.5 MG tablet Commonly known as: ATIVAN Take 0.5 mg by mouth every 8 (eight) hours as needed for anxiety.   memantine 10 MG tablet Commonly known as: NAMENDA Take 10 mg by mouth 2 (two) times daily.  POLYVINYL ALCOHOL-POVIDONE OP Place 2 drops into both eyes 3 (three) times daily. drops; 0.5-0.6 %; amt: 2 drops each eye; ophthalmic (eye)   QUEtiapine 25 MG tablet Commonly known as: SEROQUEL Take 25 mg by mouth 2 (two) times daily.        Review of Systems  Unable to perform ROS: Dementia   Immunization History  Administered Date(s) Administered   Influenza-Unspecified 02/26/2019, 03/07/2020, 03/13/2021   Moderna Sars-Covid-2 Vaccination 05/28/2019, 06/25/2019, 04/03/2020, 10/23/2020   PFIZER(Purple Top)SARS-COV-2 Vaccination 02/12/2021   Pneumococcal Conjugate-13 12/31/2018   Pneumococcal Polysaccharide-23 02/24/2004   Pneumococcal-Unspecified 05/22/2016   Tdap 10/03/2017, 01/13/2021   Zoster  Recombinat (Shingrix) 04/29/2021   Zoster, Live 05/26/2009   Pertinent  Health Maintenance Due  Topic Date Due   DEXA SCAN  Never done   INFLUENZA VACCINE  Completed   Fall Risk 05/13/2020 05/13/2020 05/14/2020 05/22/2020 01/13/2021  Patient Fall Risk Level High fall risk High fall risk High fall risk High fall risk Moderate fall risk   Functional Status Survey:    Vitals:   05/09/21 1347  BP: 118/68  Pulse: 76  Resp: 20  Temp: 98.1 F (36.7 C)  SpO2: 92%  Weight: 149 lb 3.2 oz (67.7 kg)  Height: '5\' 6"'  (1.676 m)   Body mass index is 24.08 kg/m. Physical Exam Vitals and nursing note reviewed.  Constitutional:      Appearance: Normal appearance.  HENT:     Head: Normocephalic and atraumatic.     Mouth/Throat:     Mouth: Mucous membranes are moist.  Eyes:     Extraocular Movements: Extraocular movements intact.     Conjunctiva/sclera: Conjunctivae normal.     Pupils: Pupils are equal, round, and reactive to light.  Cardiovascular:     Rate and Rhythm: Normal rate and regular rhythm.     Heart sounds: No murmur heard. Pulmonary:     Effort: Pulmonary effort is normal.     Breath sounds: No rales.  Abdominal:     Palpations: Abdomen is soft.     Tenderness: There is no abdominal tenderness.  Musculoskeletal:     Right lower leg: No edema.     Left lower leg: No edema.  Skin:    General: Skin is warm and dry.  Neurological:     General: No focal deficit present.     Mental Status: She is alert.     Gait: Gait abnormal.     Comments: Oriented to person  Psychiatric:     Comments: Confused, pleasant, conversing.     Labs reviewed: Recent Labs    05/11/20 0532 05/12/20 0525 05/13/20 0516 05/14/20 0440 05/29/20 0000 12/05/20 0000 02/14/21 0000 02/15/21 0000 02/20/21 0000  NA 139 141 139 139   < > 135* 138  --  144  K 3.7 3.5 3.4* 4.0   < > 5.0 3.6  --  3.7  CL 103 103 102 104   < > 99 102  --  106  CO2 '26 27 26 25   ' < > 29* 29*  --  30*  GLUCOSE 88  87 113* 80  --   --   --   --   --   BUN '13 13 13 14   ' < > 14 32*  --  21  CREATININE 0.96 1.09* 1.11* 1.08*   < > 1.1 2.2*  --  1.6*  CALCIUM 8.8* 8.7* 9.0 9.0   < > 9.5 11.6* 11.9* 9.5  MG 2.2 2.2 2.1  --   --   --   --   --   --  PHOS 3.9 3.5 3.4  --   --   --   --   --   --    < > = values in this interval not displayed.   Recent Labs    05/13/20 0516 05/29/20 0000 12/05/20 0000 02/14/21 0000 02/20/21 0000  AST 18   < > 11* 16 12*  ALT 27   < > 3* 9 8  ALKPHOS 112   < > 71 77 72  BILITOT 0.5  --   --   --   --   PROT 6.5  --   --   --   --   ALBUMIN 2.7*   < > 3.6 3.5 3.5   < > = values in this interval not displayed.   Recent Labs    05/12/20 0525 05/13/20 0516 05/29/20 0000 12/05/20 0000  WBC 7.5 7.3 5.5 6.9  NEUTROABS  --   --   --  4,444.00  HGB 10.9* 11.9* 12.5 12.8  HCT 33.9* 36.4 37 39  MCV 92.9 92.9  --   --   PLT 242 266 206 229   Lab Results  Component Value Date   TSH 6.72 (A) 02/15/2021   No results found for: HGBA1C No results found for: CHOL, HDL, LDLCALC, LDLDIRECT, TRIG, CHOLHDL  Significant Diagnostic Results in last 30 days:  No results found.  Assessment/Plan Elevated TSH TSH 6.71 02/15/21, will update TSH   Fall unobserved fall 05/08/21 when the patient was found on the floor mat in her room, the patient stated she rolled out of bed, no apparent injury noted. Needs safety precaution, close supervision.   Hypercalcemia Serum Ca 11.9 02/15/21>>9.5 02/20/21,  off Ca supplement, PTH<10, repeat CMP/eGFR  Vascular dementia ([place])  takes Depakote and Seroquel. Vistaril hs. On Namenda, Donepezil.   Depression with anxiety takes Lexpro, Clonazepam, Lorazepam, less emotional outburst noted.  Unsteady gait Furniture or walker to ambulates.     Family/ staff Communication: plan of care reviewed with the patient and charge nurse.   Labs/tests ordered:   none  Time spend 25 minutes.

## 2021-05-09 NOTE — Assessment & Plan Note (Signed)
unobserved fall 05/08/21 when the patient was found on the floor mat in her room, the patient stated she rolled out of bed, no apparent injury noted. Needs safety precaution, close supervision.

## 2021-05-10 ENCOUNTER — Encounter: Payer: Self-pay | Admitting: Nurse Practitioner

## 2021-05-15 LAB — BASIC METABOLIC PANEL
BUN: 16 (ref 4–21)
CO2: 29 — AB (ref 13–22)
Chloride: 101 (ref 99–108)
Creatinine: 1.2 — AB (ref 0.5–1.1)
Glucose: 86
Potassium: 4.4 (ref 3.4–5.3)
Sodium: 138 (ref 137–147)

## 2021-05-15 LAB — COMPREHENSIVE METABOLIC PANEL
Albumin: 3.7 (ref 3.5–5.0)
Calcium: 9.3 (ref 8.7–10.7)
Globulin: 3

## 2021-05-15 LAB — HEPATIC FUNCTION PANEL
ALT: 7 (ref 7–35)
AST: 11 — AB (ref 13–35)
Alkaline Phosphatase: 74 (ref 25–125)
Bilirubin, Total: 0.5

## 2021-05-15 LAB — TSH: TSH: 2.7 (ref 0.41–5.90)

## 2021-06-12 LAB — CBC: RBC: 4.14 (ref 3.87–5.11)

## 2021-06-12 LAB — CBC AND DIFFERENTIAL
HCT: 38 (ref 36–46)
Hemoglobin: 12.1 (ref 12.0–16.0)
Neutrophils Absolute: 3217
Platelets: 184 (ref 150–399)
WBC: 5.3

## 2021-06-12 LAB — BASIC METABOLIC PANEL
BUN: 16 (ref 4–21)
CO2: 35 — AB (ref 13–22)
Chloride: 101 (ref 99–108)
Creatinine: 1.2 — AB (ref 0.5–1.1)
Glucose: 89
Potassium: 4.5 (ref 3.4–5.3)
Sodium: 138 (ref 137–147)

## 2021-06-12 LAB — HEPATIC FUNCTION PANEL
ALT: 6 — AB (ref 7–35)
AST: 10 — AB (ref 13–35)
Alkaline Phosphatase: 77 (ref 25–125)
Bilirubin, Total: 0.3

## 2021-06-12 LAB — COMPREHENSIVE METABOLIC PANEL
Albumin: 3.5 (ref 3.5–5.0)
Calcium: 9.1 (ref 8.7–10.7)
Globulin: 3

## 2021-06-13 ENCOUNTER — Non-Acute Institutional Stay (SKILLED_NURSING_FACILITY): Payer: Medicare Other | Admitting: Nurse Practitioner

## 2021-06-13 ENCOUNTER — Encounter: Payer: Self-pay | Admitting: Nurse Practitioner

## 2021-06-13 DIAGNOSIS — N1831 Chronic kidney disease, stage 3a: Secondary | ICD-10-CM

## 2021-06-13 DIAGNOSIS — R7989 Other specified abnormal findings of blood chemistry: Secondary | ICD-10-CM

## 2021-06-13 DIAGNOSIS — F418 Other specified anxiety disorders: Secondary | ICD-10-CM

## 2021-06-13 DIAGNOSIS — F01C18 Vascular dementia, severe, with other behavioral disturbance: Secondary | ICD-10-CM

## 2021-06-13 NOTE — Assessment & Plan Note (Signed)
/  behavioral issues, managed, takes Depakote and Seroquel. Vistaril hs. On Namenda, Donepezil.

## 2021-06-13 NOTE — Progress Notes (Signed)
Location:   SNF [place] Room Number: 165 [facility]VZ[facility] of Service:  SNF (31) Provider: [facility] Rebbecca Osuna NP  [name], MD  Patient Care Team: [name], MD as PCP - General (Internal Medicine)  Extended Emergency Contact Information Primary Emergency Contact: Bertrum Sol, [facility]) [facility] Address: [address]          [place], [place] [address] [name] of [place] Phone: [phone] Relation: Relative Secondary Emergency Contact: [name] Mobile Phone: [phone] Relation: Relative  Code Status:  DNR Goals of care: Advanced Directive information Advanced Directives 05/09/2021  Does Patient Have a Medical Advance Directive? Yes  Type of Paramedic of [place];Living will;Out of facility DNR (pink MOST or yellow form)  Does patient want to make changes to medical advance directive? No - Patient declined  Copy of [place] in Chart? Yes - validated most recent copy scanned in chart (See row information)  Pre-existing out of facility DNR order (yellow form or pink MOST form) Yellow form placed in chart (order not valid for inpatient use)     Chief Complaint  Patient presents with   Medical Management of Chronic Issues    HPI:  Pt is a 88 y.o. female seen today for medical management of chronic diseases.     Serum Ca 11.9 02/15/21>>9.5 02/20/21,  off Ca supplement, PTH<10             Dementia/behavioral issues, takes Depakote and Seroquel. Vistaril hs. On Namenda, Donepezil.              Depression/anxiety, takes Lexpro, Lorazepam, Hydroxyzine nightly.  CKD Bun/creat 16/1.17, eGFR 45 05/14/21 Elevated TSH 6.72 02/15/21>>2.7 05/14/21.  Gait abnormality, furniture or walker, needs reminder for safety.  Past Medical History:  Diagnosis Date   Arthritis    Dementia ([place])    Hx of concussion    [place]   Hyperlipidemia    Vitamin D deficiency, unspecified    Past Surgical History:   Procedure Laterality Date   CHOLECYSTECTOMY      Allergies  Allergen Reactions   Fish-Derived Products    Pravachol [Pravastatin Sodium]     Allergies as of 06/13/2021       Reactions   Fish-derived Products    Pravachol [pravastatin Sodium]         Medication List        Accurate as of June 13, 2021 11:59 PM. If you have any questions, ask your nurse or doctor.          clonazePAM 0.5 MG tablet Commonly known as: KLONOPIN Take 1 tablet (0.5 mg total) by mouth at bedtime.   divalproex 250 MG DR tablet Commonly known as: DEPAKOTE Take 125 mg by mouth daily.   donepezil 5 MG tablet Commonly known as: ARICEPT Take 5 mg by mouth 2 (two) times daily.   escitalopram 10 MG tablet Commonly known as: LEXAPRO Take 10 mg by mouth daily.   hydrOXYzine 25 MG capsule Commonly known as: VISTARIL Take 25 mg by mouth at bedtime.   LORazepam 0.5 MG tablet Commonly known as: ATIVAN Take 0.5 mg by mouth every 8 (eight) hours as needed for anxiety.   memantine 10 MG tablet Commonly known as: NAMENDA Take 10 mg by mouth 2 (two) times daily.   POLYVINYL ALCOHOL-POVIDONE OP Place 2 drops into both eyes 3 (three) times daily. drops; 0.5-0.6 %; amt: 2 drops each eye; ophthalmic (eye)   QUEtiapine 25 MG tablet  Commonly known as: SEROQUEL Take 25 mg by mouth 2 (two) times daily.        Review of Systems  Unable to perform ROS: Dementia   Immunization History  Administered Date(s) Administered   Influenza-Unspecified 02/26/2019, 03/07/2020, 03/13/2021   Moderna Sars-Covid-2 Vaccination 05/28/2019, 06/25/2019, 04/03/2020, 10/23/2020   PFIZER(Purple Top)SARS-COV-2 Vaccination 02/12/2021   Pneumococcal Conjugate-13 12/31/2018   Pneumococcal Polysaccharide-23 02/24/2004   Pneumococcal-Unspecified 05/22/2016   Tdap 10/03/2017, 01/13/2021   Zoster Recombinat (Shingrix) 04/29/2021   Zoster, Live 05/26/2009   Pertinent  Health Maintenance Due  Topic Date Due   DEXA  SCAN  Never done   INFLUENZA VACCINE  Completed   Fall Risk 05/13/2020 05/13/2020 05/14/2020 05/22/2020 01/13/2021  Patient Fall Risk Level High fall risk High fall risk High fall risk High fall risk Moderate fall risk   Functional Status Survey:    Vitals:   06/13/21 1135  BP: (!) 146/72  Pulse: 74  Resp: 18  Temp: 97.6 F (36.4 C)  SpO2: 96%   There is no height or weight on file to calculate BMI. Physical Exam Vitals and nursing note reviewed.  Constitutional:      Appearance: Normal appearance.  HENT:     Head: Normocephalic and atraumatic.     Mouth/Throat:     Mouth: Mucous membranes are moist.  Eyes:     Extraocular Movements: Extraocular movements intact.     Conjunctiva/sclera: Conjunctivae normal.     Pupils: Pupils are equal, round, and reactive to light.  Cardiovascular:     Rate and Rhythm: Normal rate and regular rhythm.     Heart sounds: No murmur heard. Pulmonary:     Effort: Pulmonary effort is normal.     Breath sounds: No rales.  Abdominal:     Palpations: Abdomen is soft.     Tenderness: There is no abdominal tenderness.  Musculoskeletal:     Right lower leg: No edema.     Left lower leg: No edema.  Skin:    General: Skin is warm and dry.  Neurological:     General: No focal deficit present.     Mental Status: She is alert.     Gait: Gait abnormal.     Comments: Oriented to person  Psychiatric:     Comments: Confused, pleasant, conversing.     Labs reviewed: Recent Labs    12/05/20 0000 02/14/21 0000 02/15/21 0000 02/20/21 0000  NA 135* 138  --  144  K 5.0 3.6  --  3.7  CL 99 102  --  106  CO2 29* 29*  --  30*  BUN 14 32*  --  21  CREATININE 1.1 2.2*  --  1.6*  CALCIUM 9.5 11.6* 11.9* 9.5   Recent Labs    12/05/20 0000 02/14/21 0000 02/20/21 0000  AST 11* 16 12*  ALT 3* 9 8  ALKPHOS 71 77 72  ALBUMIN 3.6 3.5 3.5   Recent Labs    12/05/20 0000  WBC 6.9  NEUTROABS 4,444.00  HGB 12.8  HCT 39  PLT 229   Lab Results   Component Value Date   TSH 6.72 (A) 02/15/2021   No results found for: HGBA1C No results found for: CHOL, HDL, LDLCALC, LDLDIRECT, TRIG, CHOLHDL  Significant Diagnostic Results in last 30 days:  No results found.  Assessment/Plan  Vascular dementia ([place]) [place] issues, managed, takes Depakote and Seroquel. Vistaril hs. On Namenda, Donepezil.   Depression with anxiety Occasional restlessness related to progression of dementia,  takes Lexpro, Lorazepam, Hydroxyzine nightly.   CKD (chronic kidney disease) stage 3, GFR 30-59 ml/min (HCC) Bun/creat 16/1.17 eGFR 45 05/14/21  Elevated TSH  Elevated TSH 6.72 02/15/21>>2.7 05/14/21.    Family/ staff Communication: plan of care reviewed with the  patient and charge nurse.   Labs/tests ordered:  none  Time spend 35 minutes.

## 2021-06-13 NOTE — Assessment & Plan Note (Signed)
Occasional restlessness related to progression of dementia,  takes Lexpro, Lorazepam, Hydroxyzine nightly.

## 2021-06-13 NOTE — Assessment & Plan Note (Addendum)
Bun/creat 16/1.17 eGFR 45 05/14/21

## 2021-06-13 NOTE — Assessment & Plan Note (Signed)
°  Elevated TSH 6.72 02/15/21>>2.7 05/14/21.

## 2021-06-17 ENCOUNTER — Encounter: Payer: Self-pay | Admitting: Nurse Practitioner

## 2021-06-25 ENCOUNTER — Other Ambulatory Visit: Payer: Self-pay | Admitting: *Deleted

## 2021-06-25 DIAGNOSIS — F418 Other specified anxiety disorders: Secondary | ICD-10-CM

## 2021-06-25 DIAGNOSIS — F01C18 Vascular dementia, severe, with other behavioral disturbance: Secondary | ICD-10-CM

## 2021-06-25 MED ORDER — CLONAZEPAM 0.5 MG PO TABS: 0.5000 mg | ORAL_TABLET | Freq: Every day | ORAL | 0 refills | Status: DC

## 2021-06-25 NOTE — Telephone Encounter (Signed)
[name] Requested refill °Pended Rx and sent to [facility] for approval.  °

## 2021-07-09 ENCOUNTER — Encounter: Payer: Self-pay | Admitting: Internal Medicine

## 2021-07-09 ENCOUNTER — Non-Acute Institutional Stay (SKILLED_NURSING_FACILITY): Payer: Medicare Other | Admitting: Internal Medicine

## 2021-07-09 DIAGNOSIS — G301 Alzheimer's disease with late onset: Secondary | ICD-10-CM

## 2021-07-09 DIAGNOSIS — N1831 Chronic kidney disease, stage 3a: Secondary | ICD-10-CM | POA: Diagnosis not present

## 2021-07-09 DIAGNOSIS — F418 Other specified anxiety disorders: Secondary | ICD-10-CM | POA: Diagnosis not present

## 2021-07-09 DIAGNOSIS — F02818 Dementia in other diseases classified elsewhere, unspecified severity, with other behavioral disturbance: Secondary | ICD-10-CM

## 2021-07-09 NOTE — Progress Notes (Signed)
Location:   [place] Room Number: [place] of Service:  SNF [phone]) Provider:  Veleta Miners MD  [name], MD  Patient Care Team: [name], MD as PCP - General (Internal Medicine)  Extended Emergency Contact Information Primary Emergency Contact: Bertrum Sol, [facility]) [facility] Address: [address]          [place], [place] [address] [name] of [place] Phone: [phone] Relation: Relative Secondary Emergency Contact: [name] Mobile Phone: [phone] Relation: Relative  Code Status:  DNR Goals of care: Advanced Directive information Advanced Directives 07/09/2021  Does Patient Have a Medical Advance Directive? Yes  Type of Advance Directive Living will;Healthcare Power of Attorney  Does patient want to make changes to medical advance directive? No - Patient declined  Copy of [place] in Chart? Yes - validated most recent copy scanned in chart (See row information)  Pre-existing out of facility DNR order (yellow form or pink MOST form) -     Chief Complaint  Patient presents with   Medical Management of Chronic Issues   Quality Metric Gaps    Shingrix Dexa NCIR and matrix verified    HPI:  Pt is a 88 y.o. female seen today for medical management of chronic diseases.    Patient has Dementia and Lives in Memory Unit  CT Scan shows Moderate severity cerebral atrophy and microvascular disease  Also h/o  Intraabdominal Abscess/ Liver abscess IR was consulted. Underwent Liver Abscess Aspiration on 05/07/20 Hypercalcemia Due to dehydration and PO calcium. PTH was low  Lives in Memory unit  She is stable. No new Nursing issues.   Behavior issues are controlled on Depakote Vistaril Klonopin and Seroquel Sometimes staff has to use Ativan to calm her  Walks with her walker No Recent Falls Wt Readings from Last 3 Encounters:  07/09/21 158 lb 12.8 oz (72 kg)  05/09/21 149 lb 3.2 oz (67.7 kg)  04/26/21 149  lb 3.2 oz (67.7 kg)   Past Medical History:  Diagnosis Date   Arthritis    Dementia ([place])    Hx of concussion    [place]   Hyperlipidemia    Vitamin D deficiency, unspecified    Past Surgical History:  Procedure Laterality Date   CHOLECYSTECTOMY      Allergies  Allergen Reactions   Fish-Derived Products    Pravachol [Pravastatin Sodium]     Allergies as of 07/09/2021       Reactions   Fish-derived Products    Pravachol [pravastatin Sodium]         Medication List        Accurate as of July 09, 2021 11:00 AM. If you have any questions, ask your nurse or doctor.          clonazePAM 0.5 MG tablet Commonly known as: KLONOPIN Take 1 tablet (0.5 mg total) by mouth at bedtime.   divalproex 250 MG DR tablet Commonly known as: DEPAKOTE Take 125 mg by mouth daily.   donepezil 5 MG tablet Commonly known as: ARICEPT Take 5 mg by mouth 2 (two) times daily.   escitalopram 10 MG tablet Commonly known as: LEXAPRO Take 10 mg by mouth daily.   hydrOXYzine 25 MG capsule Commonly known as: VISTARIL Take 25 mg by mouth at bedtime.   LORazepam 0.5 MG tablet Commonly known as: ATIVAN Take 0.5 mg by mouth every 8 (eight) hours as needed for anxiety.   memantine 10 MG tablet Commonly known  as: NAMENDA Take 10 mg by mouth 2 (two) times daily.   POLYVINYL ALCOHOL-POVIDONE OP Place 2 drops into both eyes 3 (three) times daily. drops; 0.5-0.6 %; amt: 2 drops each eye; ophthalmic (eye)   QUEtiapine 25 MG tablet Commonly known as: SEROQUEL Take 25 mg by mouth 2 (two) times daily.        Review of Systems  Unable to perform ROS: Dementia   Immunization History  Administered Date(s) Administered   Influenza-Unspecified 02/26/2019, 03/07/2020, 03/13/2021   Moderna Sars-Covid-2 Vaccination 05/28/2019, 06/25/2019, 04/03/2020, 10/23/2020   PFIZER(Purple Top)SARS-COV-2 Vaccination 02/12/2021   Pneumococcal Conjugate-13 12/31/2018    Pneumococcal Polysaccharide-23 02/24/2004   Pneumococcal-Unspecified 05/22/2016   Tdap 10/03/2017, 01/13/2021   Zoster Recombinat (Shingrix) 04/29/2021   Zoster, Live 05/26/2009   Zoster, Unspecified 05/26/2009   Pertinent  Health Maintenance Due  Topic Date Due   DEXA SCAN  Never done   INFLUENZA VACCINE  Completed   Fall Risk 05/13/2020 05/13/2020 05/14/2020 05/22/2020 01/13/2021  Patient Fall Risk Level High fall risk High fall risk High fall risk High fall risk Moderate fall risk   Functional Status Survey:    Vitals:   07/09/21 1039  BP: 133/81  Pulse: 80  Resp: 20  Temp: 98.2 F (36.8 C)  SpO2: 94%  Weight: 158 lb 12.8 oz (72 kg)  Height: 5\' 6"  (1.676 m)   Body mass index is 25.63 kg/m. Physical Exam Vitals reviewed.  Constitutional:      Appearance: Normal appearance.  HENT:     Head: Normocephalic.     Nose: Nose normal.     Mouth/Throat:     Mouth: Mucous membranes are moist.     Pharynx: Oropharynx is clear.  Eyes:     Pupils: Pupils are equal, round, and reactive to light.  Cardiovascular:     Rate and Rhythm: Normal rate and regular rhythm.     Pulses: Normal pulses.     Heart sounds: Normal heart sounds. No murmur heard. Pulmonary:     Effort: Pulmonary effort is normal.     Breath sounds: Normal breath sounds.  Abdominal:     General: Abdomen is flat. Bowel sounds are normal.     Palpations: Abdomen is soft.  Musculoskeletal:        General: No swelling.     Cervical back: Neck supple.  Skin:    General: Skin is warm.  Neurological:     General: No focal deficit present.     Mental Status: She is alert.     Comments: Walking with her walker  Psychiatric:        Mood and Affect: Mood normal.        Thought Content: Thought content normal.    Labs reviewed: Recent Labs    02/20/21 0000 05/15/21 0000 06/12/21 0000  NA 144 138 138  K 3.7 4.4 4.5  CL 106 101 101  CO2 30* 29* 35*  BUN 21 16 16   CREATININE 1.6* 1.2* 1.2*  CALCIUM  9.5 9.3 9.1   Recent Labs    02/20/21 0000 05/15/21 0000 06/12/21 0000  AST 12* 11* 10*  ALT 8 7 6*  ALKPHOS 72 74 77  ALBUMIN 3.5 3.7 3.5   Recent Labs    12/05/20 0000 06/12/21 0000  WBC 6.9 5.3  NEUTROABS 4,444.00 3,217.00  HGB 12.8 12.1  HCT 39 38  PLT 229 184   Lab Results  Component Value Date   TSH 2.70 05/15/2021   No results found  for: HGBA1C No results found for: CHOL, HDL, LDLCALC, LDLDIRECT, TRIG, CHOLHDL  Significant Diagnostic Results in last 30 days:  No results found.  Assessment/Plan Late onset Alzheimer's dementia with behavioral disturbance ([place]) Doing well on Depakote and Seroquel Also on Namenda and Aricept GDR has failed Will continue for  now Depression with anxiety On Lexapro and Klonopin Stage 3a chronic kidney disease (HCC) Creat stable Hypercalcemia Calcium normal levels    Family/ staff Communication:   Labs/tests ordered:

## 2021-07-12 ENCOUNTER — Encounter: Payer: Self-pay | Admitting: Nurse Practitioner

## 2021-07-12 ENCOUNTER — Non-Acute Institutional Stay (INDEPENDENT_AMBULATORY_CARE_PROVIDER_SITE_OTHER): Payer: Medicare Other | Admitting: Nurse Practitioner

## 2021-07-12 DIAGNOSIS — F01C18 Vascular dementia, severe, with other behavioral disturbance: Secondary | ICD-10-CM

## 2021-07-12 DIAGNOSIS — Z0000 Encounter for general adult medical examination without abnormal findings: Secondary | ICD-10-CM | POA: Diagnosis not present

## 2021-07-16 ENCOUNTER — Encounter: Payer: Self-pay | Admitting: Nurse Practitioner

## 2021-07-16 NOTE — Progress Notes (Addendum)
Subjective:   Sarah Larsen is a 88 y.o. female who presents for Medicare Annual (Subsequent) preventive examination at SNF [facility]     Objective:    Today's Vitals   07/12/21 1126  BP: 133/81  Pulse: 80  Resp: 20  Temp: 97.8 F (36.6 C)  SpO2: 96%  Weight: 158 lb 12.8 oz (72 kg)  Height: 5\' 6"  (1.676 m)   Body mass index is 25.63 kg/m.  Advanced Directives 07/12/2021 07/09/2021 05/09/2021 04/26/2021 04/02/2021 02/25/2021 02/18/2021  Does Patient Have a Medical Advance Directive? Yes Yes Yes Yes Yes Yes Yes  Type of Advance Directive Living will;Healthcare Power of Attorney Living will;Healthcare Power of [place];Living will;Out of facility DNR (pink MOST or yellow form) [place];Living will;Out of facility DNR (pink MOST or yellow form) [place];Living will;Out of facility DNR (pink MOST or yellow form) [place];Living will;Out of facility DNR (pink MOST or yellow form) [place];Living will;Out of facility DNR (pink MOST or yellow form)  Does patient want to make changes to medical advance directive? No - Patient declined No - Patient declined No - Patient declined No - Patient declined No - Patient declined No - Patient declined No - Patient declined  Copy of [place] in Chart? Yes - validated most recent copy scanned in chart (See row information) Yes - validated most recent copy scanned in chart (See row information) Yes - validated most recent copy scanned in chart (See row information) Yes - validated most recent copy scanned in chart (See row information) Yes - validated most recent copy scanned in chart (See row information) Yes - validated most recent copy scanned in chart (See row information) Yes - validated most recent copy scanned in chart (See row information)  Pre-existing out of facility DNR order (yellow form or pink MOST form) - - Yellow form placed in  chart (order not valid for inpatient use) Yellow form placed in chart (order not valid for inpatient use) Yellow form placed in chart (order not valid for inpatient use) - Yellow form placed in chart (order not valid for inpatient use)    Current Medications (verified) Outpatient Encounter Medications as of 07/12/2021  Medication Sig   clonazePAM (KLONOPIN) 0.5 MG tablet Take 1 tablet (0.5 mg total) by mouth at bedtime.   divalproex (DEPAKOTE) 250 MG DR tablet Take 125 mg by mouth daily.   donepezil (ARICEPT) 5 MG tablet Take 5 mg by mouth 2 (two) times daily.   escitalopram (LEXAPRO) 10 MG tablet Take 10 mg by mouth daily.   hydrOXYzine (VISTARIL) 25 MG capsule Take 25 mg by mouth at bedtime.   LORazepam (ATIVAN) 0.5 MG tablet Take 0.5 mg by mouth every 8 (eight) hours as needed for anxiety.   memantine (NAMENDA) 10 MG tablet Take 10 mg by mouth 2 (two) times daily.   POLYVINYL ALCOHOL-POVIDONE OP Place 2 drops into both eyes 3 (three) times daily. drops; 0.5-0.6 %; amt: 2 drops each eye; ophthalmic (eye)   QUEtiapine (SEROQUEL) 25 MG tablet Take 25 mg by mouth 2 (two) times daily.   No facility-administered encounter medications on file as of 07/12/2021.    Allergies (verified) Fish-derived products and Pravachol [pravastatin sodium]   History: Past Medical History:  Diagnosis Date   Arthritis    Dementia ([place])    Hx of concussion    [place]   Hyperlipidemia    Vitamin D  deficiency, unspecified    Past Surgical History:  Procedure Laterality Date   CHOLECYSTECTOMY     Family History  Family history unknown: Yes   Social History   Socioeconomic History   Marital status: Single    Spouse name: Not on file   Number of children: Not on file   Years of education: Not on file   Highest education level: Not on file  Occupational History   Not on file  Tobacco Use   Smoking status: Never   Smokeless tobacco: Never  Vaping Use   Vaping Use: Never  used  Substance and Sexual Activity   Alcohol use: Never   Drug use: Never   Sexual activity: Never  Other Topics Concern   Not on file  Social History Narrative   Not on file   Social Determinants of Health   Financial Resource Strain: Not on file  Food Insecurity: Not on file  Transportation Needs: Not on file  Physical Activity: Not on file  Stress: Not on file  Social Connections: Not on file    Tobacco Counseling Counseling given: Not Answered   Clinical Intake:  Pre-visit preparation completed: Yes  Pain : No/denies pain     BMI - recorded: 25.63 Nutritional Status: BMI 25 -29 Overweight Nutritional Risks: None Diabetes: No  How often do you need to have someone help you when you read instructions, pamphlets, or other written materials from your doctor or pharmacy?: 3 - Sometimes What is the last grade level you completed in school?: master degree  Diabetic?no  Interpreter Needed?: No  Information entered by :: Neya Creegan Bretta Bang NP   Activities of Daily Living In your present state of health, do you have any difficulty performing the following activities: 07/16/2021  Hearing? N  Difficulty concentrating or making decisions? Y  Walking or climbing stairs? Y  Dressing or bathing? Y  Doing errands, shopping? Y  Preparing Food and eating ? N  Using the Toilet? N  In the past six months, have you accidently leaked urine? Y  Do you have problems with loss of bowel control? N  Managing your Medications? Y  Managing your Finances? Y  Housekeeping or managing your Housekeeping? Y  Some recent data might be hidden    Patient Care Team: [name], MD as PCP - General (Internal Medicine)  Indicate any recent Medical Services you may have received from other than [facility] providers in the past year (date may be approximate).     Assessment:   This is a routine wellness examination for [facility].  Hearing/Vision screen No results found.  Dietary issues and  exercise activities discussed: Current Exercise Habits: The patient does not participate in regular exercise at present, Exercise limited by: orthopedic condition(s);psychological condition(s)   Goals Addressed             This Visit's Progress    Cognitive Function Enhanced       Evidence-based guidance:  Assess changes in cognitive function using standardized assessment when available.  Encourage and refer for services that stimulate thinking, problem-solving and memory, such as cognitive training and cognitive rehabilitation.  Encourage physical activity or exercise based on ability and tolerance.  Encourage enjoyable activities that provide general stimulation for thinking, concentration and memory in a social setting or small group.   Notes:        Depression Screen No flowsheet data found.  Fall Risk No flowsheet data found.  FALL RISK PREVENTION PERTAINING TO THE HOME:  Any stairs in or around the home? Yes  If so, are there any without handrails? No  Home free of loose throw rugs in walkways, pet beds, electrical cords, etc? Yes  Adequate lighting in your home to reduce risk of falls? Yes   ASSISTIVE DEVICES UTILIZED TO PREVENT FALLS:  Life alert? No  Use of a cane, walker or w/c? Yes  Grab bars in the bathroom? Yes  Shower chair or bench in shower? Yes  Elevated toilet seat or a handicapped toilet? Yes   TIMED UP AND GO:  Was the test performed? No .    Gait steady and fast with assistive device  Cognitive Function: MMSE - Mini Mental State Exam 07/30/2021  Orientation to time 1  Orientation to Place 3  Registration 3  Attention/ Calculation 5  Recall 0  Language- name 2 objects 2  Language- repeat 1  Language- follow 3 step command 3  Language- read & follow direction 1  Write a sentence 1  Copy design 1  Total score 21        Immunizations Immunization History  Administered Date(s) Administered   Influenza-Unspecified 02/26/2019,  03/07/2020, 03/13/2021   Moderna Sars-Covid-2 Vaccination 05/28/2019, 06/25/2019, 04/03/2020, 10/23/2020   PFIZER(Purple Top)SARS-COV-2 Vaccination 02/12/2021   Pneumococcal Conjugate-13 12/31/2018   Pneumococcal Polysaccharide-23 02/24/2004   Pneumococcal-Unspecified 05/22/2016   Tdap 10/03/2017, 01/13/2021   Zoster Recombinat (Shingrix) 04/29/2021   Zoster, Live 05/26/2009   Zoster, Unspecified 05/26/2009    Tdap up to date  Flu Vaccine status: Up to date  Pneumococcal vaccine status: Up to date  Covid-19 vaccine status: Completed vaccines  Qualifies for Shingles Vaccine? Yes   Zostavax completed Yes   Shingrix Completed?: Yes  Screening Tests Health Maintenance  Topic Date Due   DEXA SCAN  Never done   Zoster Vaccines- Shingrix (2 of 2) 06/24/2021   TETANUS/TDAP  01/14/2031   Pneumonia Vaccine 8+ Years old  Completed   INFLUENZA VACCINE  Completed   COVID-19 Vaccine  Completed   HPV VACCINES  Aged Out    Health Maintenance  Health Maintenance Due  Topic Date Due   DEXA SCAN  Never done   Zoster Vaccines- Shingrix (2 of 2) 06/24/2021    Colorectal cancer screening: No longer required.   Mammogram status: No longer required due to aged out.  Bone Density status: Ordered DEXA. Pt provided with contact info and advised to call to schedule appt.  Lung Cancer Screening: (Low Dose CT Chest recommended if Age 31-80 years, 30 pack-year currently smoking OR have quit w/in 15years.) does not qualify.     Additional Screening:  Hepatitis C Screening: does not qualify  Vision Screening: Recommended annual ophthalmology exams for early detection of glaucoma and other disorders of the eye. Is the patient up to date with their annual eye exam?  No  Who is the provider or what is the name of the office in which the patient attends annual eye exams? Refer to Ophthalmology If pt is not established with a provider, would they like to be referred to a provider to establish  care? No .   Dental Screening: Recommended annual dental exams for proper oral hygiene  Community Resource Referral / Chronic Care Management: CRR required this visit?  No   CCM required this visit?  No      Plan:    Will update MMSE.  DEXA, Eye Exam, Shingrix order provided.  I have personally reviewed and noted the following in the patients chart:  Medical and social history Use of alcohol, tobacco or illicit drugs  Current medications and supplements including opioid prescriptions.  Functional ability and status Nutritional status Physical activity Advanced directives List of other physicians Hospitalizations, surgeries, and ER visits in previous 12 months Vitals Screenings to include cognitive, depression, and falls Referrals and appointments  In addition, I have reviewed and discussed with patient certain preventive protocols, quality metrics, and best practice recommendations. A written personalized care plan for preventive services as well as general preventive health recommendations were provided to patient.     Damary Doland X Darious Rehman, NP   07/30/2021

## 2021-08-08 ENCOUNTER — Encounter: Payer: Self-pay | Admitting: Nurse Practitioner

## 2021-08-08 ENCOUNTER — Non-Acute Institutional Stay (SKILLED_NURSING_FACILITY): Payer: Medicare Other | Admitting: Nurse Practitioner

## 2021-08-08 DIAGNOSIS — F418 Other specified anxiety disorders: Secondary | ICD-10-CM | POA: Diagnosis not present

## 2021-08-08 DIAGNOSIS — R7989 Other specified abnormal findings of blood chemistry: Secondary | ICD-10-CM | POA: Diagnosis not present

## 2021-08-08 DIAGNOSIS — N1831 Chronic kidney disease, stage 3a: Secondary | ICD-10-CM

## 2021-08-08 DIAGNOSIS — R2681 Unsteadiness on feet: Secondary | ICD-10-CM | POA: Diagnosis not present

## 2021-08-08 DIAGNOSIS — F01C18 Vascular dementia, severe, with other behavioral disturbance: Secondary | ICD-10-CM

## 2021-08-08 NOTE — Assessment & Plan Note (Signed)
Elevated TSH 6.72 02/15/21>>2.7 05/14/21.  

## 2021-08-08 NOTE — Assessment & Plan Note (Signed)
Her mood is stable,  takes Lexpro, Clonazepam, Depakote, Quetiapine, Lorazepam, Hydroxyzine nightly.  ?

## 2021-08-08 NOTE — Assessment & Plan Note (Signed)
Bun/creat 16/1.2 06/12/21 ?

## 2021-08-08 NOTE — Assessment & Plan Note (Signed)
Less mood or behavioral outbursts,  takes Depakote and Seroquel. Vistaril hs. On Namenda, Donepezil.  ?

## 2021-08-08 NOTE — Assessment & Plan Note (Signed)
furniture or walker, needs reminder for safety.  

## 2021-08-08 NOTE — Progress Notes (Signed)
?Location:   [facility]  ?Nursing Home Room Number: 103 ?Place of Service:  SNF (31) ?Provider:  Marlana Latus NP  ? ?Sarah Dad, MD ? ?Patient Care Team: ?Sarah Dad, MD as PCP - General (Internal Medicine) ? ?Extended Emergency Contact Information ?Primary Emergency Contact: Bertrum Sol, (Warren) Margaret ?Address: [address] ?         [place], [place] [address] [place] ?Mobile Phone: [phone] ?Relation: Relative ?Secondary Emergency Contact: [name] ?Mobile Phone: [phone] ?Relation: Relative ? ?Code Status:  DNR ?Goals of care: Advanced Directive information ?Advanced Directives 08/08/2021  ?Does Patient Have a Medical Advance Directive? Yes  ?Type of Advance Directive Living will;Healthcare Power of Attorney  ?Does patient want to make changes to medical advance directive? No - Patient declined  ?Copy of [place] in Chart? Yes - validated most recent copy scanned in chart (See row information)  ?Pre-existing out of facility DNR order (yellow form or pink MOST form) -  ? ? ? ?Chief Complaint  ?Patient presents with  ? Medical Management of Chronic Issues  ? Quality Metric Gaps  ?  Verified [facility] and [facility] patient is due for DEXA SCAN (Once) and Zoster Vaccines- Shingrix ? ?  ? ? ?HPI:  ?Pt is a 88 y.o. female seen today for medical management of chronic diseases.   ?  ?Serum Ca 11.9 02/15/21>>9.5 02/20/21>>9.1 06/12/21,  off Ca supplement, PTH<10 ?            Dementia/behavioral issues, takes Depakote and Seroquel. Vistaril hs. On Namenda, Donepezil.  ?            Depression/anxiety, takes Lexpro, Clonazepam, Depakote, Quetiapine, Lorazepam, Hydroxyzine nightly.  ?CKD Bun/creat 16/1.2 06/12/21 ?Elevated TSH 6.72 02/15/21>>2.7 05/14/21.  ?Gait abnormality, furniture or walker, needs reminder for safety.  ?Past Medical History:  ?Diagnosis Date  ? Arthritis   ? Dementia ([place])   ? Hx of concussion   ? [place]  ? Hyperlipidemia   ?  Vitamin D deficiency, unspecified   ? ?Past Surgical History:  ?Procedure Laterality Date  ? CHOLECYSTECTOMY    ? ? ?Allergies  ?Allergen Reactions  ? Fish-Derived Products   ? Pravachol [Pravastatin Sodium]   ? ? ?Allergies as of 08/08/2021   ? ?   Reactions  ? Fish-derived Products   ? Pravachol [pravastatin Sodium]   ? ?  ? ?  ?Medication List  ?  ? ?  ? Accurate as of August 08, 2021 11:59 PM. If you have any questions, ask your nurse or doctor.  ?  ?  ? ?  ? ?clonazePAM 0.5 MG tablet ?Commonly known as: KLONOPIN ?Take 1 tablet (0.5 mg total) by mouth at bedtime. ?  ?divalproex 250 MG DR tablet ?Commonly known as: DEPAKOTE ?Take 125 mg by mouth 2 (two) times daily. ?  ?donepezil 5 MG tablet ?Commonly known as: ARICEPT ?Take 5 mg by mouth 2 (two) times daily. ?  ?escitalopram 10 MG tablet ?Commonly known as: LEXAPRO ?Take 10 mg by mouth daily. ?  ?hydrOXYzine 25 MG capsule ?Commonly known as: VISTARIL ?Take 25 mg by mouth at bedtime. ?  ?LORazepam 0.5 MG tablet ?Commonly known as: ATIVAN ?Take 0.5 mg by mouth every 8 (eight) hours as needed for anxiety. ?  ?memantine 10 MG tablet ?Commonly known as: NAMENDA ?Take 10 mg by mouth 2 (two) times daily. ?  ?POLYVINYL ALCOHOL-POVIDONE OP ?Place 2 drops into both eyes 3 (three) times daily.  drops; 0.5-0.6 %; amt: 2 drops each eye; ophthalmic (eye) ?  ?QUEtiapine 25 MG tablet ?Commonly known as: SEROQUEL ?Take 25 mg by mouth 2 (two) times daily. ?  ? ?  ? ? ?Review of Systems  ?Unable to perform ROS: Dementia  ? ?Immunization History  ?Administered Date(s) Administered  ? Influenza-Unspecified 02/26/2019, 03/07/2020, 03/13/2021  ? Moderna Sars-Covid-2 Vaccination 05/28/2019, 06/25/2019, 04/03/2020, 10/23/2020  ? PFIZER(Purple Top)SARS-COV-2 Vaccination 02/12/2021  ? Pneumococcal Conjugate-13 12/31/2018  ? Pneumococcal Polysaccharide-23 02/24/2004  ? Pneumococcal-Unspecified 05/22/2016  ? Tdap 10/03/2017, 01/13/2021  ? Zoster Recombinat (Shingrix) 04/29/2021  ? Zoster, Live  05/26/2009  ? Zoster, Unspecified 05/26/2009  ? ?Pertinent  Health Maintenance Due  ?Topic Date Due  ? DEXA SCAN  Never done  ? INFLUENZA VACCINE  Completed  ? ?Fall Risk 05/13/2020 05/13/2020 05/14/2020 05/22/2020 01/13/2021  ?Patient Fall Risk Level High fall risk High fall risk High fall risk High fall risk Moderate fall risk  ? ?Functional Status Survey: ?  ? ?Vitals:  ? 08/08/21 1041  ?BP: 122/82  ?Pulse: 73  ?Resp: 16  ?Temp: 98.4 ?F (36.9 ?C)  ?SpO2: 98%  ?Weight: 161 lb 3.2 oz (73.1 kg)  ?Height: 5\' 6"  (1.676 m)  ? ?Body mass index is 26.02 kg/m?Marland Kitchen ?Physical Exam ?Vitals and nursing note reviewed.  ?Constitutional:   ?   Appearance: Normal appearance.  ?   Comments: Weight gained about #3Ibs in the past month  ?HENT:  ?   Head: Normocephalic and atraumatic.  ?   Mouth/Throat:  ?   Mouth: Mucous membranes are moist.  ?Eyes:  ?   Extraocular Movements: Extraocular movements intact.  ?   Conjunctiva/sclera: Conjunctivae normal.  ?   Pupils: Pupils are equal, round, and reactive to light.  ?Cardiovascular:  ?   Rate and Rhythm: Normal rate and regular rhythm.  ?   Heart sounds: No murmur heard. ?Pulmonary:  ?   Effort: Pulmonary effort is normal.  ?   Breath sounds: No rales.  ?Abdominal:  ?   Palpations: Abdomen is soft.  ?   Tenderness: There is no abdominal tenderness.  ?Musculoskeletal:  ?   Right lower leg: No edema.  ?   Left lower leg: No edema.  ?Skin: ?   General: Skin is warm and dry.  ?Neurological:  ?   General: No focal deficit present.  ?   Mental Status: She is alert.  ?   Gait: Gait abnormal.  ?   Comments: Oriented to person  ?Psychiatric:  ?   Comments: Confused, pleasant, conversing.   ? ? ?Labs reviewed: ?Recent Labs  ?  02/20/21 ?0000 05/15/21 ?0000 06/12/21 ?0000  ?NA 144 138 138  ?K 3.7 4.4 4.5  ?CL 106 101 101  ?CO2 30* 29* 35*  ?BUN 21 16 16   ?CREATININE 1.6* 1.2* 1.2*  ?CALCIUM 9.5 9.3 9.1  ? ?Recent Labs  ?  02/20/21 ?0000 05/15/21 ?0000 06/12/21 ?0000  ?AST 12* 11* 10*  ?ALT 8 7 6*   ?ALKPHOS 72 74 77  ?ALBUMIN 3.5 3.7 3.5  ? ?Recent Labs  ?  12/05/20 ?0000 06/12/21 ?0000  ?WBC 6.9 5.3  ?NEUTROABS 4,444.00 3,217.00  ?HGB 12.8 12.1  ?HCT 39 38  ?PLT 229 184  ? ?Lab Results  ?Component Value Date  ? TSH 2.70 05/15/2021  ? ?No results found for: HGBA1C ?No results found for: CHOL, HDL, LDLCALC, LDLDIRECT, TRIG, CHOLHDL ? ?Significant Diagnostic Results in last 30 days:  ?No results found. ? ?Assessment/Plan ?CKD (  chronic kidney disease) stage 3, GFR 30-59 ml/min (HCC) ?Bun/creat 16/1.2 06/12/21 ? ?Elevated TSH ?Elevated TSH 6.72 02/15/21>>2.7 05/14/21.  ? ?Unsteady gait ?furniture or walker, needs reminder for safety.  ? ?Depression with anxiety ?Her mood is stable,  takes Lexpro, Clonazepam, Depakote, Quetiapine, Lorazepam, Hydroxyzine nightly.  ? ?Vascular dementia ([place]) ?Less mood or behavioral outbursts,  takes Depakote and Seroquel. Vistaril hs. On Namenda, Donepezil.  ? ?Hypercalcemia ?Serum Ca 11.9 02/15/21>>9.5 02/20/21>>9.1 06/12/21,  off Ca supplement, PTH<10 ? ? ? ? ?Family/ staff Communication: plan of care reviewed with the patient and charge nurse . ? ?Labs/tests ordered:  DEXA, Shrigrix.  ? ?Time spend 35 minutes.  ? ?  ?

## 2021-08-08 NOTE — Assessment & Plan Note (Signed)
Serum Ca 11.9 02/15/21>>9.5 02/20/21>>9.1 06/12/21,  off Ca supplement, PTH<10 ?

## 2021-08-09 ENCOUNTER — Encounter: Payer: Self-pay | Admitting: Nurse Practitioner

## 2021-08-13 ENCOUNTER — Encounter: Payer: Self-pay | Admitting: Nurse Practitioner

## 2021-08-15 NOTE — Progress Notes (Signed)
This encounter was created in error - please disregard.

## 2021-09-05 ENCOUNTER — Encounter: Payer: Self-pay | Admitting: Nurse Practitioner

## 2021-09-05 ENCOUNTER — Non-Acute Institutional Stay (SKILLED_NURSING_FACILITY): Payer: Medicare Other | Admitting: Nurse Practitioner

## 2021-09-05 DIAGNOSIS — F01C18 Vascular dementia, severe, with other behavioral disturbance: Secondary | ICD-10-CM | POA: Diagnosis not present

## 2021-09-05 DIAGNOSIS — F418 Other specified anxiety disorders: Secondary | ICD-10-CM | POA: Diagnosis not present

## 2021-09-05 DIAGNOSIS — R2681 Unsteadiness on feet: Secondary | ICD-10-CM

## 2021-09-05 DIAGNOSIS — R112 Nausea with vomiting, unspecified: Secondary | ICD-10-CM | POA: Diagnosis not present

## 2021-09-05 DIAGNOSIS — R7989 Other specified abnormal findings of blood chemistry: Secondary | ICD-10-CM

## 2021-09-05 DIAGNOSIS — N1831 Chronic kidney disease, stage 3a: Secondary | ICD-10-CM

## 2021-09-05 NOTE — Progress Notes (Signed)
?Location:  [facility] ?Nursing Home Room Number: 103-A ?Place of Service:  SNF (31) ?Provider:  [name], MD ? ? ? ? ?Patient Care Team: ?[name], MD as PCP - General (Internal Medicine) ? ?Extended Emergency Contact Information ?Primary Emergency Contact: Bertrum Sol, ([place]) Margaret ?Address: [address] ?         [place], [place] [address] [place] ?Mobile Phone: [phone] ?Relation: Relative ?Secondary Emergency Contact: [name] ?Mobile Phone: [phone] ?Relation: Relative ? ?Code Status:  DNR ?Goals of care: Advanced Directive information ? ?  09/05/2021  ?  2:24 PM  ?Advanced Directives  ?Does Patient Have a Medical Advance Directive? Yes  ?Type of Paramedic of [place];Living will;Out of facility DNR (pink MOST or yellow form)  ?Does patient want to make changes to medical advance directive? No - Patient declined  ?Copy of [place] in Chart? Yes - validated most recent copy scanned in chart (See row information)  ? ? ? ?Chief Complaint  ?Patient presents with  ? Acute Visit  ?  Nauseous and pain  ? ? ?HPI:  ?Pt is a 88 y.o. female seen today for an acute visit for reported the patient's nauseous and right sided pain, refused breakfast. The patient denied nausea, vomiting, upset or queasy stomach, denied dysuria, last BM yesterday. She is afebrile.  ?Serum Ca 11.9 02/15/21>>9.5 02/20/21>>9.1 06/12/21,  off Ca supplement, PTH<10 ?            Dementia/behavioral issues, takes Depakote and Seroquel. Vistaril hs. On Namenda, Donepezil. . 07/22/21 MMSE 21/30 ?            Depression/anxiety, takes Lexpro, Clonazepam, Depakote, Quetiapine, Lorazepam, Hydroxyzine nightly.  ?CKD Bun/creat 16/1.2 06/12/21 ?Elevated TSH 6.72 02/15/21>>2.7 05/14/21.  ?Gait abnormality, furniture or walker, needs reminder for safety.  ?  ? ? ?Past Medical History:  ?Diagnosis Date  ? Arthritis   ? Dementia ([place])   ? Hx of concussion   ? [place]  ? Hyperlipidemia   ? Vitamin D deficiency, unspecified   ? ?Past Surgical History:  ?Procedure Laterality Date  ? CHOLECYSTECTOMY    ? ? ?Allergies  ?Allergen Reactions  ? Fish-Derived Products   ? Pravachol [Pravastatin Sodium]   ? ? ?Outpatient Encounter Medications as of 09/05/2021  ?Medication Sig  ? clonazePAM (KLONOPIN) 0.5 MG tablet Take 1 tablet (0.5 mg total) by mouth at bedtime.  ? divalproex (DEPAKOTE) 250 MG DR tablet Take 125 mg by mouth 2 (two) times daily.  ? donepezil (ARICEPT) 5 MG tablet Take 5 mg by mouth 2 (two) times daily.  ? escitalopram (LEXAPRO) 10 MG tablet Take 10 mg by mouth daily.  ? hydrOXYzine (VISTARIL) 25 MG capsule Take 25 mg by mouth at bedtime.  ? LORazepam (ATIVAN) 0.5 MG tablet Take 0.5 mg by mouth every 8 (eight) hours as needed for anxiety.  ? memantine (NAMENDA) 10 MG tablet Take 10 mg by mouth 2 (two) times daily.  ? POLYVINYL ALCOHOL-POVIDONE OP Place 2 drops into both eyes 3 (three) times daily. drops; 0.5-0.6 %; amt: 2 drops each eye; ophthalmic (eye)  ? QUEtiapine (SEROQUEL) 25 MG tablet Take 25 mg by mouth 2 (two) times daily.  ? ?No facility-administered encounter medications on file as of 09/05/2021.  ? ? ?Review of Systems  ?Constitutional:  Negative for fatigue, fever and unexpected weight change.  ?HENT:  Positive for hearing loss. Negative for congestion and voice change.   ?  Eyes:  Negative for visual disturbance.  ?Respiratory:  Negative for cough.   ?Cardiovascular:  Negative for leg swelling.  ?Gastrointestinal:  Negative for abdominal distention, abdominal pain, constipation, nausea and vomiting.  ?Genitourinary:  Negative for dysuria, frequency and urgency.  ?Musculoskeletal:  Positive for arthralgias and gait problem. Negative for myalgias.  ?     Gait has improved, rails or furniture walking sometimes.   ?Skin:  Negative for color change.  ?Neurological:  Negative for speech difficulty, weakness and headaches.  ?     Dementia   ?Psychiatric/Behavioral:  Positive for agitation, behavioral problems and confusion. Negative for sleep disturbance. The patient is nervous/anxious.   ?     Less emotional, behavioral outbursts   ? ?Immunization History  ?Administered Date(s) Administered  ? Influenza-Unspecified 02/26/2019, 03/07/2020, 03/13/2021  ? Moderna Sars-Covid-2 Vaccination 05/28/2019, 06/25/2019, 04/03/2020, 10/23/2020  ? PFIZER(Purple Top)SARS-COV-2 Vaccination 02/12/2021  ? Pneumococcal Conjugate-13 12/31/2018  ? Pneumococcal Polysaccharide-23 02/24/2004  ? Pneumococcal-Unspecified 05/22/2016  ? Tdap 10/03/2017, 01/13/2021  ? Zoster Recombinat (Shingrix) 04/29/2021  ? Zoster, Live 05/26/2009  ? Zoster, Unspecified 05/26/2009  ? ?Pertinent  Health Maintenance Due  ?Topic Date Due  ? DEXA SCAN  10/08/2021 (Originally 10/01/1999)  ? INFLUENZA VACCINE  12/24/2021  ? ? ?  05/13/2020  ?  8:00 AM 05/13/2020  ?  8:00 PM 05/14/2020  ?  8:07 AM 05/22/2020  ?  7:52 PM 01/13/2021  ?  6:42 PM  ?Fall Risk  ?Patient Fall Risk Level High fall risk High fall risk High fall risk High fall risk Moderate fall risk  ? ?Functional Status Survey: ?  ? ?Vitals:  ? 09/05/21 1419  ?BP: (!) 154/90  ?Pulse: 78  ?Resp: (!) 22  ?Temp: (!) 97.3 ?F (36.3 ?C)  ?SpO2: 95%  ?Weight: 162 lb (73.5 kg)  ?Height: '5\' 6"'  (1.676 m)  ? ?Body mass index is 26.15 kg/m?Marland Kitchen ?Physical Exam ?Vitals and nursing note reviewed.  ?Constitutional:   ?   Comments: Appears tired, seen in bed in her room  ?HENT:  ?   Head: Normocephalic and atraumatic.  ?   Mouth/Throat:  ?   Mouth: Mucous membranes are moist.  ?Eyes:  ?   Extraocular Movements: Extraocular movements intact.  ?   Conjunctiva/sclera: Conjunctivae normal.  ?   Pupils: Pupils are equal, round, and reactive to light.  ?Cardiovascular:  ?   Rate and Rhythm: Normal rate and regular rhythm.  ?   Heart sounds: No murmur heard. ?Pulmonary:  ?   Effort: Pulmonary effort is normal.  ?   Breath sounds: No rales.  ?Abdominal:  ?   General:  Bowel sounds are normal. There is no distension.  ?   Palpations: Abdomen is soft.  ?   Tenderness: There is no abdominal tenderness. There is no right CVA tenderness, left CVA tenderness, guarding or rebound.  ?   Hernia: No hernia is present.  ?Musculoskeletal:  ?   Right lower leg: No edema.  ?   Left lower leg: No edema.  ?Skin: ?   General: Skin is warm and dry.  ?Neurological:  ?   General: No focal deficit present.  ?   Mental Status: She is alert.  ?   Gait: Gait abnormal.  ?   Comments: Oriented to person  ?Psychiatric:  ?   Comments: Confused, pleasant, conversing.   ? ? ?Labs reviewed: ?Recent Labs  ?  02/20/21 ?0000 05/15/21 ?0000 06/12/21 ?0000  ?NA 144 138 138  ?K 3.7  4.4 4.5  ?CL 106 101 101  ?CO2 30* 29* 35*  ?BUN '21 16 16  ' ?CREATININE 1.6* 1.2* 1.2*  ?CALCIUM 9.5 9.3 9.1  ? ?Recent Labs  ?  02/20/21 ?0000 05/15/21 ?0000 06/12/21 ?0000  ?AST 12* 11* 10*  ?ALT 8 7 6*  ?ALKPHOS 72 74 77  ?ALBUMIN 3.5 3.7 3.5  ? ?Recent Labs  ?  12/05/20 ?0000 06/12/21 ?0000  ?WBC 6.9 5.3  ?NEUTROABS 4,444.00 3,217.00  ?HGB 12.8 12.1  ?HCT 39 38  ?PLT 229 184  ? ?Lab Results  ?Component Value Date  ? TSH 2.70 05/15/2021  ? ?No results found for: HGBA1C ?No results found for: CHOL, HDL, LDLCALC, LDLDIRECT, TRIG, CHOLHDL ? ?Significant Diagnostic Results in last 30 days:  ?No results found. ? ?Assessment/Plan ?Nausea & vomiting ?reported the patient's nauseous and right sided pain, refused breakfast. The patient denied nausea, vomiting, upset or queasy stomach, denied dysuria, last BM yesterday. She is afebrile.  ?09/05/21 CBC/diff, CMP/eGFR, Amylase, Lipase. Omeprazole 14m qd, c/o poor appetite.  ? ?Hypercalcemia ?Serum Ca 11.9 02/15/21>>9.5 02/20/21>>9.1 06/12/21,  off Ca supplement, PTH<10 ? ?Vascular dementia ([place] ?takes Depakote and Seroquel. Vistaril hs. On Namenda, Donepezil. 07/22/21 MMSE 21/30 ? ?Depression with anxiety ? takes Lexpro, Clonazepam, Depakote, Quetiapine, Lorazepam, Hydroxyzine nightly.  ? ?CKD  (chronic kidney disease) stage 3, GFR 30-59 ml/min (HCC) ?Bun/creat 16/1.2 06/12/21 ? ?Elevated TSH ?Normalized TSH 2.63 05/14/21 ? ?Unsteady gait ?furniture or walker, needs reminder for safety.  ? ? ? ? ?Family/ staff Communicat

## 2021-09-05 NOTE — Assessment & Plan Note (Signed)
takes Lexpro, Clonazepam, Depakote, Quetiapine, Lorazepam, Hydroxyzine nightly.  ?

## 2021-09-05 NOTE — Assessment & Plan Note (Signed)
Normalized TSH 2.63 05/14/21 ?

## 2021-09-05 NOTE — Assessment & Plan Note (Signed)
Serum Ca 11.9 02/15/21>>9.5 02/20/21>>9.1 06/12/21,  off Ca supplement, PTH<10 ?

## 2021-09-05 NOTE — Assessment & Plan Note (Signed)
Bun/creat 16/1.2 06/12/21 ?

## 2021-09-05 NOTE — Assessment & Plan Note (Signed)
reported the patient's nauseous and right sided pain, refused breakfast. The patient denied nausea, vomiting, upset or queasy stomach, denied dysuria, last BM yesterday. She is afebrile.  ?09/05/21 CBC/diff, CMP/eGFR, Amylase, Lipase. Omeprazole 69m qd, c/o poor appetite.  ?

## 2021-09-05 NOTE — Assessment & Plan Note (Signed)
takes Depakote and Seroquel. Vistaril hs. On Namenda, Donepezil. . 07/22/21 MMSE 21/30 

## 2021-09-05 NOTE — Assessment & Plan Note (Signed)
furniture or walker, needs reminder for safety.  

## 2021-09-07 LAB — CBC AND DIFFERENTIAL
HCT: 37 (ref 36–46)
Hemoglobin: 12 (ref 12.0–16.0)
Neutrophils Absolute: 2756
Platelets: 201 10*3/uL (ref 150–400)
WBC: 5.3

## 2021-09-07 LAB — CBC: RBC: 4.26 (ref 3.87–5.11)

## 2021-09-09 ENCOUNTER — Non-Acute Institutional Stay (SKILLED_NURSING_FACILITY): Payer: Medicare Other | Admitting: Nurse Practitioner

## 2021-09-09 ENCOUNTER — Encounter: Payer: Self-pay | Admitting: Nurse Practitioner

## 2021-09-09 DIAGNOSIS — F01C18 Vascular dementia, severe, with other behavioral disturbance: Secondary | ICD-10-CM

## 2021-09-09 DIAGNOSIS — R7989 Other specified abnormal findings of blood chemistry: Secondary | ICD-10-CM

## 2021-09-09 DIAGNOSIS — N1831 Chronic kidney disease, stage 3a: Secondary | ICD-10-CM | POA: Diagnosis not present

## 2021-09-09 DIAGNOSIS — F418 Other specified anxiety disorders: Secondary | ICD-10-CM

## 2021-09-09 DIAGNOSIS — R2681 Unsteadiness on feet: Secondary | ICD-10-CM

## 2021-09-09 NOTE — Progress Notes (Signed)
?Location:   [facility] ?Nursing Home Room Number: NO103/A ?Place of Service:  SNF (31) ?Provider:  Jaxten Brosh X, NP ? ?Sarah Dad, MD ? ?Patient Care Team: ?Sarah Dad, MD as PCP - General (Internal Medicine) ? ?Extended Emergency Contact Information ?Primary Emergency Contact: Bertrum Sol, ([place]) Margaret ?Address: [address] ?         [place], [place] [address] [place] ?Mobile Phone: [phone] ?Relation: Relative ?Secondary Emergency Contact: [name] ?Mobile Phone: [phone] ?Relation: Relative ? ?Code Status:   ?Goals of care: Advanced Directive information ? ?  09/09/2021  ? 10:33 AM  ?Advanced Directives  ?Does Patient Have a Medical Advance Directive? Yes  ?Type of Paramedic of [place];Out of facility DNR (pink MOST or yellow form)  ?Does patient want to make changes to medical advance directive? No - Patient declined  ?Copy of [place] in Chart? Yes - validated most recent copy scanned in chart (See row information)  ? ? ? ?Chief Complaint  ?Patient presents with  ? Medical Management of Chronic Issues  ?  Patient is here for F/U for chronic health conditions  ? ? ?HPI:  ?Pt is a 88 y.o. female seen today for medical management of chronic diseases.   ?  ?Serum Ca 11.9 02/15/21>>9.5 02/20/21>>9.1 06/12/21,  off Ca supplement, PTH<10 ?            Dementia/behavioral issues, takes Depakote and Seroquel. Vistaril hs. On Namenda, Donepezil. . 07/22/21 MMSE 21/30 ?            Depression/anxiety, takes Lexpro, Clonazepam, Depakote, Quetiapine, Lorazepam, Hydroxyzine nightly.  ?CKD stable.  ?Elevated TSH 6.72 02/15/21>>2.7 05/14/21.  ?Gait abnormality, furniture or walker, needs reminder for safety.  ?             ?Past Medical History:  ?Diagnosis Date  ? Arthritis   ? Dementia ([place])   ? Hx of concussion   ? [place]  ? Hyperlipidemia   ? Vitamin D deficiency, unspecified   ? ?Past Surgical History:  ?Procedure  Laterality Date  ? CHOLECYSTECTOMY    ? ? ?Allergies  ?Allergen Reactions  ? Fish-Derived Products   ? Pravachol [Pravastatin Sodium]   ? ? ?Allergies as of 09/09/2021   ? ?   Reactions  ? Fish-derived Products   ? Pravachol [pravastatin Sodium]   ? ?  ? ?  ?Medication List  ?  ? ?  ? Accurate as of September 09, 2021 11:59 PM. If you have any questions, ask your nurse or doctor.  ?  ?  ? ?  ? ?clonazePAM 0.5 MG tablet ?Commonly known as: KLONOPIN ?Take 1 tablet (0.5 mg total) by mouth at bedtime. ?  ?divalproex 250 MG DR tablet ?Commonly known as: DEPAKOTE ?Take 125 mg by mouth 2 (two) times daily. ?  ?donepezil 5 MG tablet ?Commonly known as: ARICEPT ?Take 5 mg by mouth 2 (two) times daily. ?  ?escitalopram 10 MG tablet ?Commonly known as: LEXAPRO ?Take 10 mg by mouth daily. ?  ?hydrOXYzine 25 MG capsule ?Commonly known as: VISTARIL ?Take 25 mg by mouth at bedtime. ?  ?LORazepam 0.5 MG tablet ?Commonly known as: ATIVAN ?Take 0.5 mg by mouth every 8 (eight) hours as needed for anxiety. ?  ?memantine 10 MG tablet ?Commonly known as: NAMENDA ?Take 10 mg by mouth 2 (two) times daily. ?  ?POLYVINYL ALCOHOL-POVIDONE OP ?Place 2 drops into both eyes 3 (three)  times daily. drops; 0.5-0.6 %; amt: 2 drops each eye; ophthalmic (eye) ?  ?QUEtiapine 25 MG tablet ?Commonly known as: SEROQUEL ?Take 25 mg by mouth 2 (two) times daily. ?  ? ?  ? ? ?Review of Systems  ?Constitutional:  Negative for fatigue, fever and unexpected weight change.  ?HENT:  Positive for hearing loss. Negative for congestion and voice change.   ?Eyes:  Negative for visual disturbance.  ?Respiratory:  Negative for cough.   ?Cardiovascular:  Negative for leg swelling.  ?Gastrointestinal:  Negative for abdominal pain and constipation.  ?Genitourinary:  Negative for dysuria, frequency and urgency.  ?Musculoskeletal:  Positive for arthralgias and gait problem. Negative for myalgias.  ?     Rails or furniture walking sometimes.   ?Skin:  Negative for color change.   ?Neurological:  Negative for speech difficulty, weakness and headaches.  ?     Dementia  ?Psychiatric/Behavioral:  Positive for agitation, behavioral problems and confusion. Negative for sleep disturbance. The patient is nervous/anxious.   ?     Less emotional, behavioral outbursts   ? ?Immunization History  ?Administered Date(s) Administered  ? Influenza-Unspecified 02/26/2019, 03/07/2020, 03/13/2021  ? Moderna Sars-Covid-2 Vaccination 05/28/2019, 06/25/2019, 04/03/2020, 10/23/2020  ? PFIZER(Purple Top)SARS-COV-2 Vaccination 02/12/2021  ? Pneumococcal Conjugate-13 12/31/2018  ? Pneumococcal Polysaccharide-23 02/24/2004  ? Pneumococcal-Unspecified 05/22/2016  ? Tdap 10/03/2017, 01/13/2021  ? Zoster Recombinat (Shingrix) 04/29/2021  ? Zoster, Live 05/26/2009  ? Zoster, Unspecified 05/26/2009  ? ?Pertinent  Health Maintenance Due  ?Topic Date Due  ? DEXA SCAN  10/08/2021 (Originally 10/01/1999)  ? INFLUENZA VACCINE  12/24/2021  ? ? ?  05/13/2020  ?  8:00 AM 05/13/2020  ?  8:00 PM 05/14/2020  ?  8:07 AM 05/22/2020  ?  7:52 PM 01/13/2021  ?  6:42 PM  ?Fall Risk  ?Patient Fall Risk Level High fall risk High fall risk High fall risk High fall risk Moderate fall risk  ? ?Functional Status Survey: ?  ? ?Vitals:  ? 09/09/21 1025  ?BP: 137/90  ?Pulse: 79  ?Temp: (!) 97.3 ?F (36.3 ?C)  ?Weight: 162 lb 9.6 oz (73.8 kg)  ?Height: 5\' 6"  (1.676 m)  ? ?Body mass index is 26.24 kg/m?Marland Kitchen ?Physical Exam ?Vitals and nursing note reviewed.  ?Constitutional:   ?   Appearance: Normal appearance.  ?HENT:  ?   Head: Normocephalic and atraumatic.  ?   Mouth/Throat:  ?   Mouth: Mucous membranes are moist.  ?Eyes:  ?   Extraocular Movements: Extraocular movements intact.  ?   Conjunctiva/sclera: Conjunctivae normal.  ?   Pupils: Pupils are equal, round, and reactive to light.  ?Cardiovascular:  ?   Rate and Rhythm: Normal rate and regular rhythm.  ?   Heart sounds: No murmur heard. ?Pulmonary:  ?   Effort: Pulmonary effort is normal.  ?   Breath  sounds: No rales.  ?Abdominal:  ?   General: Bowel sounds are normal.  ?   Palpations: Abdomen is soft.  ?   Tenderness: There is no abdominal tenderness.  ?Musculoskeletal:  ?   Right lower leg: No edema.  ?   Left lower leg: No edema.  ?Skin: ?   General: Skin is warm and dry.  ?Neurological:  ?   General: No focal deficit present.  ?   Mental Status: She is alert. Mental status is at baseline.  ?   Gait: Gait abnormal.  ?   Comments: Oriented to person  ?Psychiatric:  ?   Comments:  Confused, pleasant, conversing.   ? ? ?Labs reviewed: ?Recent Labs  ?  02/20/21 ?0000 05/15/21 ?0000 06/12/21 ?0000  ?NA 144 138 138  ?K 3.7 4.4 4.5  ?CL 106 101 101  ?CO2 30* 29* 35*  ?BUN 21 16 16   ?CREATININE 1.6* 1.2* 1.2*  ?CALCIUM 9.5 9.3 9.1  ? ?Recent Labs  ?  02/20/21 ?0000 05/15/21 ?0000 06/12/21 ?0000  ?AST 12* 11* 10*  ?ALT 8 7 6*  ?ALKPHOS 72 74 77  ?ALBUMIN 3.5 3.7 3.5  ? ?Recent Labs  ?  12/05/20 ?0000 06/12/21 ?0000  ?WBC 6.9 5.3  ?NEUTROABS 4,444.00 3,217.00  ?HGB 12.8 12.1  ?HCT 39 38  ?PLT 229 184  ? ?Lab Results  ?Component Value Date  ? TSH 2.70 05/15/2021  ? ?No results found for: HGBA1C ?No results found for: CHOL, HDL, LDLCALC, LDLDIRECT, TRIG, CHOLHDL ? ?Significant Diagnostic Results in last 30 days:  ?No results found. ? ?Assessment/Plan ?Vascular dementia ([place]) ? Memory care unit for suportive care, takes Depakote and Seroquel. Vistaril hs. On Namenda, Donepezil. . 07/22/21 MMSE 21/30 ? ?Depression with anxiety ?Occasional emotional outbursts, redirectable, takes Lexpro, Clonazepam, Depakote, Quetiapine, Lorazepam, Hydroxyzine nightly. ? ?CKD (chronic kidney disease) stage 3, GFR 30-59 ml/min (HCC) ?At baseline.  ? ?Elevated TSH ?Normalized,  ? ?Unsteady gait ? furniture or walker, needs reminder for safety.  ? ?Hypercalcemia ?Off Ca, serum calcium level is normalized.  ? ? ? ? ?Family/ staff Communication: plan of care reviewed with the patient and charge nurse.  ? ?Labs/tests ordered:  none ? ?Time spend 35  minutes.  ? ?  ?

## 2021-09-10 ENCOUNTER — Encounter: Payer: Self-pay | Admitting: Nurse Practitioner

## 2021-09-10 NOTE — Assessment & Plan Note (Signed)
At baseline 

## 2021-09-10 NOTE — Assessment & Plan Note (Signed)
Memory care unit for suportive care, takes Depakote and Seroquel. Vistaril hs. On Namenda, Donepezil. . 07/22/21 MMSE 21/30 ?

## 2021-09-10 NOTE — Assessment & Plan Note (Signed)
Off Ca, serum calcium level is normalized.  ?

## 2021-09-10 NOTE — Assessment & Plan Note (Signed)
Normalized,  ?

## 2021-09-10 NOTE — Assessment & Plan Note (Signed)
Occasional emotional outbursts, redirectable, takes Lexpro, Clonazepam, Depakote, Quetiapine, Lorazepam, Hydroxyzine nightly. ?

## 2021-09-10 NOTE — Assessment & Plan Note (Signed)
furniture or walker, needs reminder for safety.  

## 2021-09-23 ENCOUNTER — Other Ambulatory Visit: Payer: Self-pay | Admitting: Adult Health

## 2021-09-23 DIAGNOSIS — F418 Other specified anxiety disorders: Secondary | ICD-10-CM

## 2021-09-23 DIAGNOSIS — F01C18 Vascular dementia, severe, with other behavioral disturbance: Secondary | ICD-10-CM

## 2021-09-23 MED ORDER — CLONAZEPAM 0.5 MG PO TABS: 0.5000 mg | ORAL_TABLET | Freq: Every day | ORAL | 0 refills | Status: DC

## 2021-10-08 ENCOUNTER — Non-Acute Institutional Stay (SKILLED_NURSING_FACILITY): Payer: Medicare Other | Admitting: Internal Medicine

## 2021-10-08 ENCOUNTER — Encounter: Payer: Self-pay | Admitting: Internal Medicine

## 2021-10-08 DIAGNOSIS — G301 Alzheimer's disease with late onset: Secondary | ICD-10-CM

## 2021-10-08 DIAGNOSIS — F02818 Dementia in other diseases classified elsewhere, unspecified severity, with other behavioral disturbance: Secondary | ICD-10-CM

## 2021-10-08 DIAGNOSIS — N1831 Chronic kidney disease, stage 3a: Secondary | ICD-10-CM | POA: Diagnosis not present

## 2021-10-08 DIAGNOSIS — F418 Other specified anxiety disorders: Secondary | ICD-10-CM | POA: Diagnosis not present

## 2021-10-08 LAB — LIPASE
Amylase: 78
Lipase: 216
Lipase: 23

## 2021-10-08 NOTE — Progress Notes (Signed)
Location:   [facility] Room Number: [place] of Service:  SNF [phone]) Provider:  Veleta Miners MD   [name], MD  Patient Care Team: [name], MD as PCP - General (Internal Medicine)  Extended Emergency Contact Information Primary Emergency Contact: Bertrum Sol, [facility]) [facility] Address: [address]          [place], [place] [address] [name] of [place] Phone: [phone] Relation: Relative Secondary Emergency Contact: [name] Mobile Phone: [phone] Relation: Relative  Code Status:  DNR Goals of care: Advanced Directive information    10/08/2021    3:36 PM  Advanced Directives  Does Patient Have a Medical Advance Directive? Yes  Type of Paramedic of [place];Out of facility DNR (pink MOST or yellow form)  Does patient want to make changes to medical advance directive? No - Patient declined  Copy of [place] in Chart? Yes - validated most recent copy scanned in chart (See row information)     Chief Complaint  Patient presents with   Medical Management of Chronic Issues    HPI:  Pt is a 88 y.o. female seen today for medical management of chronic diseases.    Patient has Dementia and Lives in Memory Unit  CT Scan shows Moderate severity cerebral atrophy and microvascular disease  Also h/o  Intraabdominal Abscess/ Liver abscess IR was consulted. Underwent Liver Abscess Aspiration on 05/07/20 Hypercalcemia Due to dehydration and PO calcium. PTH was low     She is stable.  Her Main issue is her behaviors She refuses to take shower. Says she has to go to [place] to her place where her parents are waiting  Other wise Her weight is stable Walks with her walker No Falls Wt Readings from Last 3 Encounters:  10/08/21 163 lb 3.2 oz (74 kg)  09/09/21 162 lb 9.6 oz (73.8 kg)  09/05/21 162 lb (73.5 kg)   Past Medical History:  Diagnosis Date   Arthritis    Dementia ([place])     Hx of concussion    [place]   Hyperlipidemia    Vitamin D deficiency, unspecified    Past Surgical History:  Procedure Laterality Date   CHOLECYSTECTOMY      Allergies  Allergen Reactions   Fish-Derived Products    Pravachol [Pravastatin Sodium]     Allergies as of 10/08/2021       Reactions   Fish-derived Products    Pravachol [pravastatin Sodium]         Medication List        Accurate as of Oct 08, 2021  3:36 PM. If you have any questions, ask your nurse or doctor.          clonazePAM 0.5 MG tablet Commonly known as: KLONOPIN Take 1 tablet (0.5 mg total) by mouth at bedtime.   divalproex 250 MG DR tablet Commonly known as: DEPAKOTE Take 125 mg by mouth 2 (two) times daily.   donepezil 5 MG tablet Commonly known as: ARICEPT Take 5 mg by mouth 2 (two) times daily.   escitalopram 10 MG tablet Commonly known as: LEXAPRO Take 10 mg by mouth daily.   hydrOXYzine 25 MG capsule Commonly known as: VISTARIL Take 25 mg by mouth at bedtime.   LORazepam 0.5 MG tablet Commonly known as: ATIVAN Take 0.5 mg by mouth every 8 (eight) hours as needed for anxiety.   memantine 10 MG tablet Commonly known as: NAMENDA Take 10  mg by mouth 2 (two) times daily.   omeprazole 20 MG capsule Commonly known as: PRILOSEC Take 20 mg by mouth daily.   POLYVINYL ALCOHOL-POVIDONE OP Place 2 drops into both eyes 3 (three) times daily. drops; 0.5-0.6 %; amt: 2 drops each eye; ophthalmic (eye)   QUEtiapine 25 MG tablet Commonly known as: SEROQUEL Take 25 mg by mouth 2 (two) times daily.        Review of Systems  Unable to perform ROS: Dementia   Immunization History  Administered Date(s) Administered   Influenza-Unspecified 02/26/2019, 03/07/2020, 03/13/2021   Moderna Sars-Covid-2 Vaccination 05/28/2019, 06/25/2019, 04/03/2020, 10/23/2020   PFIZER(Purple Top)SARS-COV-2 Vaccination 02/12/2021   Pneumococcal Conjugate-13 12/31/2018    Pneumococcal Polysaccharide-23 02/24/2004   Pneumococcal-Unspecified 05/22/2016   Tdap 10/03/2017, 01/13/2021   Zoster Recombinat (Shingrix) 04/29/2021   Zoster, Live 05/26/2009   Zoster, Unspecified 05/26/2009   Pertinent  Health Maintenance Due  Topic Date Due   DEXA SCAN  10/08/2021 (Originally 10/01/1999)   INFLUENZA VACCINE  12/24/2021      05/13/2020    8:00 AM 05/13/2020    8:00 PM 05/14/2020    8:07 AM 05/22/2020    7:52 PM 01/13/2021    6:42 PM  Fall Risk  Patient Fall Risk Level High fall risk High fall risk High fall risk High fall risk Moderate fall risk   Functional Status Survey:    Vitals:   10/08/21 1523  BP: (!) 147/66  Pulse: 78  Resp: 16  Temp: (!) 97 F (36.1 C)  SpO2: 94%  Weight: 163 lb 3.2 oz (74 kg)  Height: 5\' 6"  (1.676 m)   Body mass index is 26.34 kg/m. Physical Exam Vitals reviewed.  Constitutional:      Appearance: Normal appearance.  HENT:     Head: Normocephalic.     Nose: Nose normal.     Mouth/Throat:     Mouth: Mucous membranes are moist.     Pharynx: Oropharynx is clear.  Eyes:     Pupils: Pupils are equal, round, and reactive to light.  Cardiovascular:     Rate and Rhythm: Normal rate and regular rhythm.     Pulses: Normal pulses.     Heart sounds: Normal heart sounds. No murmur heard. Pulmonary:     Effort: Pulmonary effort is normal.     Breath sounds: Normal breath sounds.  Abdominal:     General: Abdomen is flat. Bowel sounds are normal.     Palpations: Abdomen is soft.  Musculoskeletal:        General: No swelling.     Cervical back: Neck supple.  Skin:    General: Skin is warm.  Neurological:     General: No focal deficit present.     Mental Status: She is alert.  Psychiatric:        Mood and Affect: Mood normal.        Thought Content: Thought content normal.    Labs reviewed: Recent Labs    02/20/21 0000 05/15/21 0000 06/12/21 0000  NA 144 138 138  K 3.7 4.4 4.5  CL 106 101 101  CO2 30* 29* 35*   BUN 21 16 16   CREATININE 1.6* 1.2* 1.2*  CALCIUM 9.5 9.3 9.1   Recent Labs    02/20/21 0000 05/15/21 0000 06/12/21 0000  AST 12* 11* 10*  ALT 8 7 6*  ALKPHOS 72 74 77  ALBUMIN 3.5 3.7 3.5   Recent Labs    12/05/20 0000 06/12/21 0000 09/07/21 0000  WBC 6.9 5.3 5.3  NEUTROABS 4,444.00 3,217.00 2,756.00  HGB 12.8 12.1 12.0  HCT 39 38 37  PLT 229 184 201   Lab Results  Component Value Date   TSH 2.70 05/15/2021   No results found for: HGBA1C No results found for: CHOL, HDL, LDLCALC, LDLDIRECT, TRIG, CHOLHDL  Significant Diagnostic Results in last 30 days:  No results found.  Assessment/Plan 1. Late onset Alzheimer's dementia with behavioral disturbance ([place]) On Aricept and Namenda Lives in Memory unit Also on Seroquel and Depakote Vistaril and Ativan PRN MMSE 21/30 2. Depression with anxiety On Lexapro and Klonipin  3. Stage 3a chronic kidney disease (HCC) Creat stable  4. Hypercalcemia Calcium levels normal    Family/ staff Communication:   Labs/tests ordered:

## 2021-10-22 LAB — BASIC METABOLIC PANEL
BUN: 13 (ref 4–21)
CO2: 31 — AB (ref 13–22)
Chloride: 96 — AB (ref 99–108)
Creatinine: 1.2 — AB (ref 0.5–1.1)
Glucose: 90
Potassium: 4.3 mEq/L (ref 3.5–5.1)
Sodium: 134 — AB (ref 137–147)

## 2021-10-22 LAB — HEPATIC FUNCTION PANEL
ALT: 7 U/L (ref 7–35)
AST: 11 — AB (ref 13–35)
Alkaline Phosphatase: 80 (ref 25–125)
Bilirubin, Total: 0.3

## 2021-10-22 LAB — COMPREHENSIVE METABOLIC PANEL
Albumin: 3.7 (ref 3.5–5.0)
Calcium: 8.8 (ref 8.7–10.7)
Globulin: 3.1
eGFR: 46

## 2021-10-24 ENCOUNTER — Other Ambulatory Visit: Payer: Self-pay | Admitting: Adult Health

## 2021-10-24 MED ORDER — LORAZEPAM 0.5 MG PO TABS: 0.5000 mg | ORAL_TABLET | Freq: Three times a day (TID) | ORAL | 0 refills | Status: DC | PRN

## 2021-10-28 ENCOUNTER — Other Ambulatory Visit: Payer: Self-pay | Admitting: Adult Health

## 2021-10-28 DIAGNOSIS — F418 Other specified anxiety disorders: Secondary | ICD-10-CM

## 2021-10-28 DIAGNOSIS — F01C18 Vascular dementia, severe, with other behavioral disturbance: Secondary | ICD-10-CM

## 2021-10-28 MED ORDER — CLONAZEPAM 0.5 MG PO TABS: 0.5000 mg | ORAL_TABLET | Freq: Every day | ORAL | 0 refills | Status: DC

## 2021-11-15 ENCOUNTER — Encounter: Payer: Self-pay | Admitting: Adult Health

## 2021-11-15 ENCOUNTER — Non-Acute Institutional Stay (SKILLED_NURSING_FACILITY): Payer: Medicare Other | Admitting: Adult Health

## 2021-11-15 DIAGNOSIS — K219 Gastro-esophageal reflux disease without esophagitis: Secondary | ICD-10-CM | POA: Diagnosis not present

## 2021-11-15 DIAGNOSIS — N1832 Chronic kidney disease, stage 3b: Secondary | ICD-10-CM

## 2021-11-15 DIAGNOSIS — F01C18 Vascular dementia, severe, with other behavioral disturbance: Secondary | ICD-10-CM | POA: Diagnosis not present

## 2021-11-15 DIAGNOSIS — F418 Other specified anxiety disorders: Secondary | ICD-10-CM | POA: Diagnosis not present

## 2021-11-27 ENCOUNTER — Other Ambulatory Visit: Payer: Self-pay | Admitting: Adult Health

## 2021-11-27 DIAGNOSIS — F418 Other specified anxiety disorders: Secondary | ICD-10-CM

## 2021-11-27 DIAGNOSIS — F01C18 Vascular dementia, severe, with other behavioral disturbance: Secondary | ICD-10-CM

## 2021-11-27 MED ORDER — CLONAZEPAM 0.5 MG PO TABS: 0.5000 mg | ORAL_TABLET | Freq: Every day | ORAL | 0 refills | Status: DC

## 2021-12-11 ENCOUNTER — Non-Acute Institutional Stay (SKILLED_NURSING_FACILITY): Payer: Medicare Other | Admitting: Orthopedic Surgery

## 2021-12-11 ENCOUNTER — Encounter: Payer: Self-pay | Admitting: Orthopedic Surgery

## 2021-12-11 DIAGNOSIS — R2681 Unsteadiness on feet: Secondary | ICD-10-CM

## 2021-12-11 DIAGNOSIS — F418 Other specified anxiety disorders: Secondary | ICD-10-CM

## 2021-12-11 DIAGNOSIS — N1832 Chronic kidney disease, stage 3b: Secondary | ICD-10-CM | POA: Diagnosis not present

## 2021-12-11 DIAGNOSIS — F01C18 Vascular dementia, severe, with other behavioral disturbance: Secondary | ICD-10-CM | POA: Diagnosis not present

## 2021-12-11 DIAGNOSIS — R635 Abnormal weight gain: Secondary | ICD-10-CM

## 2021-12-11 DIAGNOSIS — K219 Gastro-esophageal reflux disease without esophagitis: Secondary | ICD-10-CM

## 2021-12-11 NOTE — Progress Notes (Signed)
Location:  [facility] Room Number: NO/103/A Place of Service:  SNF [phone]) Provider:  Windell Moulding, NP  [name], MD  Patient Care Team: [name], MD as PCP - General (Internal Medicine)  Extended Emergency Contact Information Primary Emergency Contact: Bertrum Sol, [facility]) [facility] Address: [address]          [place], [place] [address] [name] of [place] Phone: [phone] Relation: Relative Secondary Emergency Contact: [name] Mobile Phone: [phone] Relation: Relative  Code Status:  DNR Goals of care: Advanced Directive information    12/11/2021    3:02 PM  Advanced Directives  Does Patient Have a Medical Advance Directive? Yes  Type of Paramedic of [place];Out of facility DNR (pink MOST or yellow form)  Does patient want to make changes to medical advance directive? No - Patient declined  Copy of [place] in Chart? Yes - validated most recent copy scanned in chart (See row information)     Chief Complaint  Patient presents with   Medical Management of Chronic Issues    Patient is here for a follow up for chronic conditions patient is also due for a DEXA scan and 2nd Shingrix, discuss 6th COVID booster    HPI:  Pt is a 88 y.o. female seen today for medical management of chronic conditions.   She currently resides on the memory care unit at [facility]. PMH: vascular dementia, CKD, elevated TSH, intraabdominal abscess, liver abscess- aspiration 2021, hypercalcemia, depression/anxiety, unstable gait.   Vascular dementia- MMSE 21/30 06/2021, 12/2020 CT head noted mild periventricular white mater changes reflecting small vessel ischemia, lives in memory care unit, no recent behaviors, ambulates with walker, remains on Namenda, klonopin, Depakote, Aricept and ativan prn Depression/anxiety- no mood changes, sometimes have increased anxiety, Na+ 138 06/12/2021, remains on  Lexapro and ativan prn CKD- BUN/creat 16/1.2 06/12/2021 Unsteady gait- no recent falls, ambulates well wit walker GERD- hgb 12.0 09/07/2021, remains on Prilosec Weight gain- see trends below, TSH 2.7012/2022  Recent blood pressures:  07/17- 116/66  07/10- 136/80  07/04- 126/68  Recent weights:  07/01- 170.2 lbs  06/01- 167.6 lbs  05/01- 163.2 lbs    Past Medical History:  Diagnosis Date   Arthritis    Dementia ([place])    Hx of concussion    [place]   Hyperlipidemia    Vitamin D deficiency, unspecified    Past Surgical History:  Procedure Laterality Date   CHOLECYSTECTOMY      Allergies  Allergen Reactions   Fish-Derived Products    Pravachol [Pravastatin Sodium]     Outpatient Encounter Medications as of 12/11/2021  Medication Sig   clonazePAM (KLONOPIN) 0.5 MG tablet Take 1 tablet (0.5 mg total) by mouth at bedtime.   divalproex (DEPAKOTE) 250 MG DR tablet Take 125 mg by mouth 2 (two) times daily.   donepezil (ARICEPT) 5 MG tablet Take 5 mg by mouth 2 (two) times daily.   escitalopram (LEXAPRO) 10 MG tablet Take 10 mg by mouth daily.   hydrOXYzine (VISTARIL) 25 MG capsule Take 25 mg by mouth at bedtime.   LORazepam (ATIVAN) 0.5 MG tablet Take 1 tablet (0.5 mg total) by mouth every 8 (eight) hours as needed for anxiety.   memantine (NAMENDA) 10 MG tablet Take 10 mg by mouth 2 (two) times daily.   omeprazole (PRILOSEC) 20 MG capsule Take 20 mg by mouth daily.   POLYVINYL ALCOHOL-POVIDONE OP Place 2 drops  into both eyes 3 (three) times daily. drops; 0.5-0.6 %; amt: 2 drops each eye; ophthalmic (eye)   QUEtiapine (SEROQUEL) 25 MG tablet Take 25 mg by mouth 2 (two) times daily.   No facility-administered encounter medications on file as of 12/11/2021.    Review of Systems  Unable to perform ROS: Dementia    Immunization History  Administered Date(s) Administered   Influenza-Unspecified 02/26/2019, 03/07/2020, 03/13/2021   Moderna  Sars-Covid-2 Vaccination 05/28/2019, 06/25/2019, 04/03/2020, 10/23/2020   PFIZER(Purple Top)SARS-COV-2 Vaccination 02/12/2021   Pneumococcal Conjugate-13 12/31/2018   Pneumococcal Polysaccharide-23 02/24/2004   Pneumococcal-Unspecified 05/22/2016   Tdap 10/03/2017, 01/13/2021   Zoster Recombinat (Shingrix) 04/29/2021   Zoster, Live 05/26/2009   Zoster, Unspecified 05/26/2009   Pertinent  Health Maintenance Due  Topic Date Due   DEXA SCAN  Never done   INFLUENZA VACCINE  12/24/2021      05/13/2020    8:00 AM 05/13/2020    8:00 PM 05/14/2020    8:07 AM 05/22/2020    7:52 PM 01/13/2021    6:42 PM  Fall Risk  Patient Fall Risk Level High fall risk High fall risk High fall risk High fall risk Moderate fall risk   Functional Status Survey:    There were no vitals filed for this visit. There is no height or weight on file to calculate BMI. Physical Exam Vitals reviewed.  Constitutional:      General: She is not in acute distress. HENT:     Head: Normocephalic.     Right Ear: There is no impacted cerumen.     Left Ear: There is no impacted cerumen.     Nose: Nose normal.     Mouth/Throat:     Mouth: Mucous membranes are moist.  Eyes:     General:        Right eye: No discharge.        Left eye: No discharge.  Cardiovascular:     Rate and Rhythm: Normal rate and regular rhythm.     Pulses: Normal pulses.     Heart sounds: Normal heart sounds.  Pulmonary:     Effort: Pulmonary effort is normal. No respiratory distress.     Breath sounds: Normal breath sounds. No wheezing.  Abdominal:     General: Bowel sounds are normal. There is no distension.     Palpations: Abdomen is soft.     Tenderness: There is no abdominal tenderness.  Musculoskeletal:     Cervical back: Neck supple.     Right lower leg: No edema.     Left lower leg: No edema.  Skin:    General: Skin is warm and dry.     Capillary Refill: Capillary refill takes less than 2 seconds.  Neurological:      General: No focal deficit present.     Mental Status: She is alert. Mental status is at baseline.     Motor: Weakness present.     Gait: Gait abnormal.     Comments: walker  Psychiatric:        Mood and Affect: Mood normal.        Behavior: Behavior normal.     Comments: Very pleasant, follows commands, alert to self and familiar face     Labs reviewed: Recent Labs    02/20/21 0000 05/15/21 0000 06/12/21 0000  NA 144 138 138  K 3.7 4.4 4.5  CL 106 101 101  CO2 30* 29* 35*  BUN 21 16 16   CREATININE 1.6* 1.2* 1.2*  CALCIUM 9.5 9.3 9.1   Recent Labs    02/20/21 0000 05/15/21 0000 06/12/21 0000  AST 12* 11* 10*  ALT 8 7 6*  ALKPHOS 72 74 77  ALBUMIN 3.5 3.7 3.5   Recent Labs    06/12/21 0000 09/07/21 0000  WBC 5.3 5.3  NEUTROABS 3,217.00 2,756.00  HGB 12.1 12.0  HCT 38 37  PLT 184 201   Lab Results  Component Value Date   TSH 2.70 05/15/2021   No results found for: "HGBA1C" No results found for: "CHOL", "HDL", "LDLCALC", "LDLDIRECT", "TRIG", "CHOLHDL"  Significant Diagnostic Results in last 30 days:  No results found.  Assessment/Plan 1. Severe vascular dementia with other behavioral disturbance (HCC) - no recent behaviors - ambulating well with walker - cont Namenda, klonopin, depakote, vistaril and ativan - cont memory care  2. Depression with anxiety - no mood changes, some imcreased anxiety - cont lexapro - cont ativan prn - cmp- future  3. Stage 3b chronic kidney disease (HCC) - avoid nephrotoxic drugs like NSAIDS and dose adjust medications to be renally excreted -encourage hydration with water  - cmp- future  4. Unsteady gait - no recent falls - cont falls safety measures  5. Gastroesophageal reflux disease without esophagitis - hgb stable - cont omeprazole - cbc/diff- future  6. Weight gain - gained 7 lbs in 2 months - suspect due to Depakote - cont monthly weights    Family/ staff Communication: plan discussed with patient  and nurse  Labs/tests ordered:  cbc/diff, cmp 12/12/2021

## 2021-12-13 LAB — HEPATIC FUNCTION PANEL
ALT: 6 U/L — AB (ref 7–35)
AST: 12 — AB (ref 13–35)
Alkaline Phosphatase: 80 (ref 25–125)
Bilirubin, Total: 0.4

## 2021-12-13 LAB — BASIC METABOLIC PANEL
BUN: 14 (ref 4–21)
CO2: 25 — AB (ref 13–22)
CO2: 25 — AB (ref 13–22)
Chloride: 98 — AB (ref 99–108)
Chloride: 98 — AB (ref 99–108)
Creatinine: 1.3 — AB (ref 0.5–1.1)
Creatinine: 1.3 — AB (ref 0.5–1.1)
Glucose: 146
Glucose: 146
Potassium: 4.3 mEq/L (ref 3.5–5.1)
Potassium: 4.3 mEq/L (ref 3.5–5.1)
Sodium: 135 — AB (ref 137–147)

## 2021-12-13 LAB — CBC AND DIFFERENTIAL
HCT: 39 (ref 36–46)
Hemoglobin: 13 (ref 12.0–16.0)
Neutrophils Absolute: 4047
Platelets: 204 10*3/uL (ref 150–400)
WBC: 5.7

## 2021-12-13 LAB — COMPREHENSIVE METABOLIC PANEL
Albumin: 4 (ref 3.5–5.0)
Calcium: 9.2 (ref 8.7–10.7)
Calcium: 9.2 (ref 8.7–10.7)
Globulin: 3.1
eGFR: 42

## 2021-12-13 LAB — CBC: RBC: 4.47 (ref 3.87–5.11)

## 2021-12-31 ENCOUNTER — Non-Acute Institutional Stay (SKILLED_NURSING_FACILITY): Payer: Medicare Other | Admitting: Internal Medicine

## 2021-12-31 ENCOUNTER — Encounter: Payer: Self-pay | Admitting: Internal Medicine

## 2021-12-31 DIAGNOSIS — F418 Other specified anxiety disorders: Secondary | ICD-10-CM

## 2021-12-31 DIAGNOSIS — N1831 Chronic kidney disease, stage 3a: Secondary | ICD-10-CM

## 2021-12-31 DIAGNOSIS — G301 Alzheimer's disease with late onset: Secondary | ICD-10-CM

## 2021-12-31 DIAGNOSIS — F02818 Dementia in other diseases classified elsewhere, unspecified severity, with other behavioral disturbance: Secondary | ICD-10-CM

## 2021-12-31 DIAGNOSIS — R600 Localized edema: Secondary | ICD-10-CM

## 2021-12-31 DIAGNOSIS — K219 Gastro-esophageal reflux disease without esophagitis: Secondary | ICD-10-CM

## 2021-12-31 NOTE — Progress Notes (Unsigned)
Location:   [facility] Room Number: 103 Place of Service:  SNF [phone]) Provider:  Einar Crow MD  [name], MD  Patient Care Team: [name], MD as PCP - General (Internal Medicine)  Extended Emergency Contact Information Primary Emergency Contact: Ramiro Harvest, [facility]) [facility] Address: [address]          [place], [place] [address] [name] of [place] Phone: [phone] Relation: Relative Secondary Emergency Contact: Marlowe Kays Mobile Phone: [phone] Relation: Relative  Code Status:  Goals of care: Advanced Directive information    12/31/2021    3:57 PM  Advanced Directives  Does Patient Have a Medical Advance Directive? Yes  Type of Estate agent of [place];Out of facility DNR (pink MOST or yellow form)  Does patient want to make changes to medical advance directive? No - Patient declined  Copy of Healthcare Power of Attorney in Chart? Yes - validated most recent copy scanned in chart (See row information)     Chief Complaint  Patient presents with   Medical Management of Chronic Issues    HPI:  Pt is a 88 y.o. female seen today for management for chronic Issues  Patient has Dementia and Lives in Memory Unit   CT Scan shows Moderate severity cerebral atrophy and microvascular disease  Also h/o  Intraabdominal Abscess/ Liver abscess Hypercalcemia  PTH was low     She is stable.  No Behavior issues Nurses had noticed some edema in her legs Patient denied any cough or SOB Her weight is stable Walks with her walker No Falls Wt Readings from Last 3 Encounters:  12/31/21 173 lb 6.4 oz (78.7 kg)  12/11/21 170 lb 3.2 oz (77.2 kg)  11/15/21 167 lb 9.6 oz (76 kg)    Past Medical History:  Diagnosis Date   Arthritis    Dementia (HCC)    Hx of concussion    [facility]   Hyperlipidemia    Vitamin D deficiency, unspecified    Past Surgical History:  Procedure Laterality  Date   CHOLECYSTECTOMY      Allergies  Allergen Reactions   Fish-Derived Products    Pravachol [Pravastatin Sodium]     Allergies as of 12/31/2021       Reactions   Fish-derived Products    Pravachol [pravastatin Sodium]         Medication List        Accurate as of December 31, 2021 11:59 PM. If you have any questions, ask your nurse or doctor.          clonazePAM 0.5 MG tablet Commonly known as: KLONOPIN Take 1 tablet (0.5 mg total) by mouth at bedtime.   divalproex 250 MG DR tablet Commonly known as: DEPAKOTE Take 125 mg by mouth 2 (two) times daily.   donepezil 5 MG tablet Commonly known as: ARICEPT Take 5 mg by mouth 2 (two) times daily.   escitalopram 10 MG tablet Commonly known as: LEXAPRO Take 10 mg by mouth daily.   hydrOXYzine 25 MG capsule Commonly known as: VISTARIL Take 25 mg by mouth at bedtime.   LORazepam 0.5 MG tablet Commonly known as: ATIVAN Take 1 tablet (0.5 mg total) by mouth every 8 (eight) hours as needed for anxiety.   memantine 10 MG tablet Commonly known as: NAMENDA Take 10 mg by mouth 2 (two) times daily.   omeprazole 20 MG capsule Commonly known as: PRILOSEC Take 20 mg by mouth daily.  POLYVINYL ALCOHOL-POVIDONE OP Place 2 drops into both eyes 3 (three) times daily. drops; 0.5-0.6 %; amt: 2 drops each eye; ophthalmic (eye)   QUEtiapine 25 MG tablet Commonly known as: SEROQUEL Take 25 mg by mouth 2 (two) times daily.        Review of Systems  Unable to perform ROS: Dementia    Immunization History  Administered Date(s) Administered   Influenza-Unspecified 02/26/2019, 03/07/2020, 03/13/2021   Moderna Sars-Covid-2 Vaccination 05/28/2019, 06/25/2019, 04/03/2020, 10/23/2020   PFIZER(Purple Top)SARS-COV-2 Vaccination 02/12/2021   Pneumococcal Conjugate-13 12/31/2018   Pneumococcal Polysaccharide-23 02/24/2004   Pneumococcal-Unspecified 05/22/2016   Tdap 10/03/2017, 01/13/2021   Zoster Recombinat (Shingrix)  04/29/2021   Zoster, Live 05/26/2009   Zoster, Unspecified 05/26/2009   Pertinent  Health Maintenance Due  Topic Date Due   DEXA SCAN  Never done   INFLUENZA VACCINE  12/24/2021      05/13/2020    8:00 AM 05/13/2020    8:00 PM 05/14/2020    8:07 AM 05/22/2020    7:52 PM 01/13/2021    6:42 PM  Fall Risk  Patient Fall Risk Level High fall risk High fall risk High fall risk High fall risk Moderate fall risk   Functional Status Survey:    Vitals:   12/31/21 1548  BP: 138/78  Pulse: 86  Resp: 20  Temp: 97.9 F (36.6 C)  SpO2: 97%  Weight: 173 lb 6.4 oz (78.7 kg)  Height: 5\' 6"  (1.676 m)   Body mass index is 27.99 kg/m. Physical Exam Vitals reviewed.  Constitutional:      Appearance: Normal appearance.  HENT:     Head: Normocephalic.     Nose: Nose normal.     Mouth/Throat:     Mouth: Mucous membranes are moist.     Pharynx: Oropharynx is clear.  Eyes:     Pupils: Pupils are equal, round, and reactive to light.  Cardiovascular:     Rate and Rhythm: Normal rate and regular rhythm.     Pulses: Normal pulses.     Heart sounds: Normal heart sounds. No murmur heard. Pulmonary:     Effort: Pulmonary effort is normal.     Breath sounds: Normal breath sounds.  Abdominal:     General: Abdomen is flat. Bowel sounds are normal.     Palpations: Abdomen is soft.  Musculoskeletal:     Cervical back: Neck supple.     Comments: Mild edema Bilateral  Skin:    General: Skin is warm.  Neurological:     General: No focal deficit present.     Mental Status: She is alert.  Psychiatric:        Mood and Affect: Mood normal.        Thought Content: Thought content normal.     Labs reviewed: Recent Labs    06/12/21 0000 10/22/21 0000 12/13/21 0000  NA 138 134* 135*  K 4.5 4.3 4.3  CL 101 96* 98*  CO2 35* 31* 25*  BUN 16 13 14   CREATININE 1.2* 1.2* 1.3*  CALCIUM 9.1 8.8 9.2   Recent Labs    06/12/21 0000 10/22/21 0000 12/13/21 0000  AST 10* 11* 12*  ALT 6* 7  6*  ALKPHOS 77 80 80  ALBUMIN 3.5 3.7 4.0   Recent Labs    06/12/21 0000 09/07/21 0000 12/13/21 0000  WBC 5.3 5.3 5.7  NEUTROABS 3,217.00 2,756.00 4,047.00  HGB 12.1 12.0 13.0  HCT 38 37 39  PLT 184 201 204   Lab Results  Component Value Date   TSH 2.70 05/15/2021   No results found for: "HGBA1C" No results found for: "CHOL", "HDL", "LDLCALC", "LDLDIRECT", "TRIG", "CHOLHDL"  Significant Diagnostic Results in last 30 days:  No results found.  Assessment/Plan 1. Late onset Alzheimer's dementia with behavioral disturbance (HCC) On Aricept,Namenda Depakote and Seroquel for Behaviors  2. Depression with anxiety Continue Vistaril,Lexapro and Klonipin  3. Hypercalcemia Calcium good levels  4. Stage 3a chronic kidney disease (HCC) Creat is stable  5. Gastroesophageal reflux disease without esophagitis On Prilosec  6. Edema, lower extremity Very Mild Would Follow for now    Family/ staff Communication:   Labs/tests ordered:

## 2021-12-31 NOTE — Progress Notes (Signed)
This encounter was created in error - please disregard.

## 2022-01-17 ENCOUNTER — Non-Acute Institutional Stay (SKILLED_NURSING_FACILITY): Payer: Medicare Other | Admitting: Nurse Practitioner

## 2022-01-17 ENCOUNTER — Encounter: Payer: Self-pay | Admitting: Nurse Practitioner

## 2022-01-17 DIAGNOSIS — L309 Dermatitis, unspecified: Secondary | ICD-10-CM | POA: Diagnosis not present

## 2022-01-17 DIAGNOSIS — K219 Gastro-esophageal reflux disease without esophagitis: Secondary | ICD-10-CM

## 2022-01-17 DIAGNOSIS — R2681 Unsteadiness on feet: Secondary | ICD-10-CM

## 2022-01-17 DIAGNOSIS — R7989 Other specified abnormal findings of blood chemistry: Secondary | ICD-10-CM

## 2022-01-17 DIAGNOSIS — N1831 Chronic kidney disease, stage 3a: Secondary | ICD-10-CM

## 2022-01-17 DIAGNOSIS — F01C18 Vascular dementia, severe, with other behavioral disturbance: Secondary | ICD-10-CM | POA: Diagnosis not present

## 2022-01-17 DIAGNOSIS — F418 Other specified anxiety disorders: Secondary | ICD-10-CM

## 2022-01-17 NOTE — Assessment & Plan Note (Signed)
Depression/anxiety, takes Lexpro, Clonazepam, Depakote, Quetiapine, Lorazepam, Hydroxyzine nightly. Na 135 12/13/21

## 2022-01-17 NOTE — Assessment & Plan Note (Signed)
itching rash upper left chest, scaly, not painful or blistering, uncertain of duration. Will apply 1% Hydrocortisone cream bid x 1week. Monitor for changes.

## 2022-01-17 NOTE — Assessment & Plan Note (Signed)
stable. Bun/creat 14/1.3 12/13/21 

## 2022-01-17 NOTE — Assessment & Plan Note (Signed)
Dementia/behavioral issues, takes Depakote and Seroquel. Vistaril hs. On Namenda, Donepezil. . 07/22/21 MMSE 21/30 

## 2022-01-17 NOTE — Assessment & Plan Note (Signed)
stable, taking Omeprazole. Hgb 13.0 12/13/21

## 2022-01-17 NOTE — Progress Notes (Signed)
Location:   [facility] Room Number: 103 A Place of Service:  SNF (31) Provider: [facility] Gayland Nicol NP  [name], MD  Patient Care Team: [name], MD as PCP - General (Internal Medicine)  Extended Emergency Contact Information Primary Emergency Contact: Ramiro Harvest, [facility]) [facility] Address: [address]          [place], [place] [address] [name] of [place] Phone: [phone] Relation: Relative Secondary Emergency Contact: Marlowe Kays Mobile Phone: [phone] Relation: Relative  Code Status: DNR Goals of care: Advanced Directive information    12/31/2021    3:57 PM  Advanced Directives  Does Patient Have a Medical Advance Directive? Yes  Type of Estate agent of [place];Out of facility DNR (pink MOST or yellow form)  Does patient want to make changes to medical advance directive? No - Patient declined  Copy of Healthcare Power of Attorney in Chart? Yes - validated most recent copy scanned in chart (See row information)     Chief Complaint  Patient presents with  . Acute Visit    Itching rash    HPI:  Pt is a 88 y.o. female seen today for an acute visit for reported itching rash upper left chest, scaly, not painful or blistering, uncertain of duration.    Serum Ca 11.9 02/15/21>>9.5 02/20/21>>9.1 06/12/21<<9.2 12/13/21,  off Ca supplement, PTH<10             Dementia/behavioral issues, takes Depakote and Seroquel. Vistaril hs. On Namenda, Donepezil. . 07/22/21 MMSE 21/30             Depression/anxiety, takes Lexpro, Clonazepam, Depakote, Quetiapine, Lorazepam, Hydroxyzine nightly. Na 135 12/13/21 CKD stable. Bun/creat 14/1.3 12/13/21 Elevated TSH 6.72 02/15/21>>2.7 05/14/21.  Gait abnormality, furniture or walker, needs reminder for safety.  GERD, stable, taking Omeprazole. Hgb 13.0 12/13/21  Past Medical History:  Diagnosis Date  . Arthritis   . Dementia (HCC)   . Hx of concussion    [facility]  .  Hyperlipidemia   . Vitamin D deficiency, unspecified    Past Surgical History:  Procedure Laterality Date  . CHOLECYSTECTOMY      Allergies  Allergen Reactions  . Fish-Derived Products   . Pravachol [Pravastatin Sodium]     Allergies as of 01/17/2022       Reactions   Fish-derived Products    Pravachol [pravastatin Sodium]         Medication List        Accurate as of January 17, 2022  4:24 PM. If you have any questions, ask your nurse or doctor.          clonazePAM 0.5 MG tablet Commonly known as: KLONOPIN Take 1 tablet (0.5 mg total) by mouth at bedtime.   divalproex 125 MG DR tablet Commonly known as: DEPAKOTE Take 125 mg by mouth 2 (two) times daily.   donepezil 5 MG tablet Commonly known as: ARICEPT Take 5 mg by mouth 2 (two) times daily.   escitalopram 10 MG tablet Commonly known as: LEXAPRO Take 10 mg by mouth daily.   hydrOXYzine 25 MG capsule Commonly known as: VISTARIL Take 25 mg by mouth at bedtime.   LORazepam 0.5 MG tablet Commonly known as: ATIVAN Take 1 tablet (0.5 mg total) by mouth every 8 (eight) hours as needed for anxiety.   memantine 10 MG tablet Commonly known as: NAMENDA Take 10 mg by mouth 2 (two) times daily.   omeprazole 20 MG capsule Commonly known as:  PRILOSEC Take 20 mg by mouth daily.   POLYVINYL ALCOHOL-POVIDONE OP Place 2 drops into both eyes 3 (three) times daily. drops; 0.5-0.6 %; amt: 2 drops each eye; ophthalmic (eye)   QUEtiapine 25 MG tablet Commonly known as: SEROQUEL Take 25 mg by mouth 2 (two) times daily.        Review of Systems  Constitutional:  Negative for appetite change, fatigue and fever.  HENT:  Positive for hearing loss. Negative for congestion and voice change.   Eyes:  Negative for visual disturbance.  Respiratory:  Negative for cough.   Cardiovascular:  Negative for leg swelling.  Gastrointestinal:  Negative for abdominal pain and constipation.  Genitourinary:  Negative for dysuria,  frequency and urgency.  Musculoskeletal:  Positive for arthralgias and gait problem. Negative for myalgias.       Rails or furniture walking sometimes.   Skin:  Positive for rash. Negative for color change.  Neurological:  Negative for speech difficulty, weakness and headaches.       Dementia  Psychiatric/Behavioral:  Positive for agitation, behavioral problems and confusion. Negative for sleep disturbance. The patient is nervous/anxious.        Less emotional, behavioral outbursts     Immunization History  Administered Date(s) Administered  . Influenza-Unspecified 02/26/2019, 03/07/2020, 03/13/2021  . Moderna Sars-Covid-2 Vaccination 05/28/2019, 06/25/2019, 04/03/2020, 10/23/2020  . PFIZER(Purple Top)SARS-COV-2 Vaccination 02/12/2021  . Pneumococcal Conjugate-13 12/31/2018  . Pneumococcal Polysaccharide-23 02/24/2004  . Pneumococcal-Unspecified 05/22/2016  . Tdap 10/03/2017, 01/13/2021  . Zoster Recombinat (Shingrix) 04/29/2021  . Zoster, Live 05/26/2009  . Zoster, Unspecified 05/26/2009   Pertinent  Health Maintenance Due  Topic Date Due  . DEXA SCAN  Never done  . INFLUENZA VACCINE  12/24/2021      05/13/2020    8:00 AM 05/13/2020    8:00 PM 05/14/2020    8:07 AM 05/22/2020    7:52 PM 01/13/2021    6:42 PM  Fall Risk  Patient Fall Risk Level High fall risk High fall risk High fall risk High fall risk Moderate fall risk   Functional Status Survey:    Vitals:   01/17/22 1518  BP: (!) 145/84  Pulse: 76  Resp: 20  Temp: 98 F (36.7 C)  SpO2: 94%  Weight: 173 lb 6.4 oz (78.7 kg)  Height: 5\' 6"  (1.676 m)   Body mass index is 27.99 kg/m. Physical Exam Vitals and nursing note reviewed.  Constitutional:      Appearance: Normal appearance.  HENT:     Head: Normocephalic and atraumatic.     Mouth/Throat:     Mouth: Mucous membranes are moist.  Eyes:     Extraocular Movements: Extraocular movements intact.     Conjunctiva/sclera: Conjunctivae normal.     Pupils:  Pupils are equal, round, and reactive to light.  Cardiovascular:     Rate and Rhythm: Normal rate and regular rhythm.     Heart sounds: No murmur heard. Pulmonary:     Effort: Pulmonary effort is normal.     Breath sounds: No rales.  Abdominal:     General: Bowel sounds are normal.     Palpations: Abdomen is soft.     Tenderness: There is no abdominal tenderness.  Musculoskeletal:     Right lower leg: No edema.     Left lower leg: No edema.  Skin:    General: Skin is warm and dry.     Findings: Rash present.     Comments: Scaly rash left upper chest, no blistering  or pain.   Neurological:     General: No focal deficit present.     Mental Status: She is alert. Mental status is at baseline.     Gait: Gait abnormal.     Comments: Oriented to person  Psychiatric:     Comments: Confused, pleasant, conversing.     Labs reviewed: Recent Labs    06/12/21 0000 10/22/21 0000 12/13/21 0000  NA 138 134* 135*  K 4.5 4.3 4.3  CL 101 96* 98*  CO2 35* 31* 25*  BUN 16 13 14   CREATININE 1.2* 1.2* 1.3*  CALCIUM 9.1 8.8 9.2   Recent Labs    06/12/21 0000 10/22/21 0000 12/13/21 0000  AST 10* 11* 12*  ALT 6* 7 6*  ALKPHOS 77 80 80  ALBUMIN 3.5 3.7 4.0   Recent Labs    06/12/21 0000 09/07/21 0000 12/13/21 0000  WBC 5.3 5.3 5.7  NEUTROABS 3,217.00 2,756.00 4,047.00  HGB 12.1 12.0 13.0  HCT 38 37 39  PLT 184 201 204   Lab Results  Component Value Date   TSH 2.70 05/15/2021   No results found for: "HGBA1C" No results found for: "CHOL", "HDL", "LDLCALC", "LDLDIRECT", "TRIG", "CHOLHDL"  Significant Diagnostic Results in last 30 days:  No results found.  Assessment/Plan: Dermatitis itching rash upper left chest, scaly, not painful or blistering, uncertain of duration. Will apply 1% Hydrocortisone cream bid x 1week. Monitor for changes.   Hypercalcemia Serum Ca 11.9 02/15/21>>9.5 02/20/21>>9.1 06/12/21<<9.2 12/13/21,  off Ca supplement, PTH<10  Vascular dementia  ([place]) Dementia/behavioral issues, takes Depakote and Seroquel. Vistaril hs. On Namenda, Donepezil. . 07/22/21 MMSE 21/30  Depression with anxiety Depression/anxiety, takes Lexpro, Clonazepam, Depakote, Quetiapine, Lorazepam, Hydroxyzine nightly. Na 135 12/13/21  CKD (chronic kidney disease) stage 3, GFR 30-59 ml/min (HCC) stable. Bun/creat 14/1.3 12/13/21  Elevated TSH Normalized  Unsteady gait  furniture or walker, needs reminder for safety.   GERD (gastroesophageal reflux disease)  stable, taking Omeprazole. Hgb 13.0 12/13/21    Family/ staff Communication: plan of care reviewed with the patient and charge nurse.   Labs/tests ordered:  none  Time spend 35 minutes.

## 2022-01-17 NOTE — Assessment & Plan Note (Signed)
furniture or walker, needs reminder for safety.  

## 2022-01-17 NOTE — Assessment & Plan Note (Signed)
Serum Ca 11.9 02/15/21>>9.5 02/20/21>>9.1 06/12/21<<9.2 12/13/21,  off Ca supplement, PTH<10

## 2022-01-17 NOTE — Assessment & Plan Note (Signed)
Normalized

## 2022-01-28 ENCOUNTER — Non-Acute Institutional Stay (SKILLED_NURSING_FACILITY): Payer: Medicare Other | Admitting: Nurse Practitioner

## 2022-01-28 ENCOUNTER — Encounter: Payer: Self-pay | Admitting: Nurse Practitioner

## 2022-01-28 DIAGNOSIS — K219 Gastro-esophageal reflux disease without esophagitis: Secondary | ICD-10-CM

## 2022-01-28 DIAGNOSIS — R7989 Other specified abnormal findings of blood chemistry: Secondary | ICD-10-CM

## 2022-01-28 DIAGNOSIS — N1831 Chronic kidney disease, stage 3a: Secondary | ICD-10-CM | POA: Diagnosis not present

## 2022-01-28 DIAGNOSIS — R2681 Unsteadiness on feet: Secondary | ICD-10-CM

## 2022-01-28 DIAGNOSIS — F01C18 Vascular dementia, severe, with other behavioral disturbance: Secondary | ICD-10-CM

## 2022-01-28 DIAGNOSIS — F418 Other specified anxiety disorders: Secondary | ICD-10-CM

## 2022-01-28 NOTE — Progress Notes (Signed)
Location:   [facility]  Nursing Home Room Number: 103-A Place of Service:  SNF (31) Provider:  Uzziel Russey, NP  PCP: [name], MD  Patient Care Team: [name], MD as PCP - General (Internal Medicine)  Extended Emergency Contact Information Primary Emergency Contact: Ramiro Harvest, [facility] Address: [address]          [place], [place] [address] [name] of [place] Phone: [phone] Relation: Relative Secondary Emergency Contact: Marlowe Kays Mobile Phone: [phone] Relation: Relative  Code Status:  DNR Goals of care: Advanced Directive information    01/28/2022   11:08 AM  Advanced Directives  Does Patient Have a Medical Advance Directive? Yes  Type of Estate agent of [place];Living will;Out of facility DNR (pink MOST or yellow form)  Does patient want to make changes to medical advance directive? No - Patient declined  Copy of Healthcare Power of Attorney in Chart? Yes - validated most recent copy scanned in chart (See row information)     Chief Complaint  Patient presents with   Medical Management of Chronic Issues    Routine Visit.    Immunizations    Discuss the need for Shingrix vaccine, and Influenza vaccine.   Health Maintenance    Discuss the need for Dexa scan.    HPI:  Pt is a 88 y.o. female seen today for medical management of chronic diseases.   Serum Ca 11.9 02/15/21>>9.2 12/13/21,  off Ca supplement, PTH<10             Dementia/behavioral issues, takes Depakote and Seroquel. Vistaril hs. On Namenda, Donepezil. . 07/22/21 MMSE 21/30             Depression/anxiety, takes Lexpro, Clonazepam, Depakote, Quetiapine, Lorazepam, Hydroxyzine nightly. Na 135 12/13/21 CKD stable. Bun/creat 14/1.3 12/13/21 Elevated TSH 6.72 02/15/21>>2.7 05/14/21.  Gait abnormality, furniture or walker, needs reminder for safety.  GERD, stable, taking Omeprazole. Hgb 13.0 12/13/21    Past Medical History:  Diagnosis Date    Arthritis    Dementia (HCC)    Hx of concussion    [facility]   Hyperlipidemia    Vitamin D deficiency, unspecified    Past Surgical History:  Procedure Laterality Date   CHOLECYSTECTOMY      Allergies  Allergen Reactions   Fish-Derived Products    Pravachol [Pravastatin Sodium]     Allergies as of 01/28/2022       Reactions   Fish-derived Products    Pravachol [pravastatin Sodium]         Medication List        Accurate as of January 28, 2022 11:59 PM. If you have any questions, ask your nurse or doctor.          clonazePAM 0.5 MG tablet Commonly known as: KLONOPIN Take 1 tablet (0.5 mg total) by mouth at bedtime.   divalproex 125 MG DR tablet Commonly known as: DEPAKOTE Take 125 mg by mouth 2 (two) times daily.   donepezil 5 MG tablet Commonly known as: ARICEPT Take 5 mg by mouth 2 (two) times daily.   escitalopram 10 MG tablet Commonly known as: LEXAPRO Take 10 mg by mouth daily.   hydrOXYzine 25 MG capsule Commonly known as: VISTARIL Take 25 mg by mouth at bedtime.   LORazepam 0.5 MG tablet Commonly known as: ATIVAN Take 1 tablet (0.5 mg total) by mouth every 8 (eight) hours as needed for anxiety.   memantine 10 MG tablet Commonly  known as: NAMENDA Take 10 mg by mouth 2 (two) times daily.   omeprazole 20 MG capsule Commonly known as: PRILOSEC Take 20 mg by mouth daily.   POLYVINYL ALCOHOL-POVIDONE OP Place 2 drops into both eyes 3 (three) times daily. drops; 0.5-0.6 %; amt: 2 drops each eye; ophthalmic (eye)   QUEtiapine 25 MG tablet Commonly known as: SEROQUEL Take 25 mg by mouth 2 (two) times daily.        Review of Systems  Constitutional:  Negative for appetite change, fatigue and fever.  HENT:  Positive for hearing loss. Negative for congestion and voice change.   Eyes:  Negative for visual disturbance.  Respiratory:  Negative for cough.   Cardiovascular:  Negative for leg swelling.  Gastrointestinal:   Negative for abdominal pain and constipation.  Genitourinary:  Negative for dysuria, frequency and urgency.  Musculoskeletal:  Positive for arthralgias and gait problem. Negative for myalgias.       Rails or furniture walking sometimes.   Skin:  Negative for color change.  Neurological:  Negative for speech difficulty, weakness and headaches.       Dementia  Psychiatric/Behavioral:  Positive for agitation, behavioral problems and confusion. Negative for sleep disturbance. The patient is nervous/anxious.        Less emotional, behavioral outbursts     Immunization History  Administered Date(s) Administered   Influenza-Unspecified 02/26/2019, 03/07/2020, 03/13/2021   Moderna Sars-Covid-2 Vaccination 05/28/2019, 06/25/2019, 04/03/2020, 10/23/2020, 10/11/2021   PFIZER(Purple Top)SARS-COV-2 Vaccination 02/12/2021   Pneumococcal Conjugate-13 12/31/2018   Pneumococcal Polysaccharide-23 02/24/2004   Pneumococcal-Unspecified 05/22/2016   Tdap 10/03/2017, 01/13/2021   Zoster Recombinat (Shingrix) 04/29/2021   Zoster, Live 05/26/2009   Zoster, Unspecified 05/26/2009   Pertinent  Health Maintenance Due  Topic Date Due   DEXA SCAN  Never done   INFLUENZA VACCINE  12/24/2021      05/13/2020    8:00 AM 05/13/2020    8:00 PM 05/14/2020    8:07 AM 05/22/2020    7:52 PM 01/13/2021    6:42 PM  Fall Risk  Patient Fall Risk Level High fall risk High fall risk High fall risk High fall risk Moderate fall risk   Functional Status Survey:    Vitals:   01/28/22 1104  BP: 123/85  Pulse: 75  Resp: 18  Temp: (!) 97.1 F (36.2 C)  SpO2: 95%  Weight: 174 lb 3.2 oz (79 kg)  Height: 5\' 6"  (1.676 m)   Body mass index is 28.12 kg/m. Physical Exam Vitals and nursing note reviewed.  Constitutional:      Appearance: Normal appearance.  HENT:     Head: Normocephalic and atraumatic.     Mouth/Throat:     Mouth: Mucous membranes are moist.  Eyes:     Extraocular Movements: Extraocular movements  intact.     Conjunctiva/sclera: Conjunctivae normal.     Pupils: Pupils are equal, round, and reactive to light.  Cardiovascular:     Rate and Rhythm: Normal rate and regular rhythm.     Heart sounds: No murmur heard. Pulmonary:     Effort: Pulmonary effort is normal.     Breath sounds: No rales.  Abdominal:     General: Bowel sounds are normal.     Palpations: Abdomen is soft.     Tenderness: There is no abdominal tenderness.  Musculoskeletal:     Right lower leg: No edema.     Left lower leg: No edema.  Skin:    General: Skin is warm and dry.  Neurological:     General: No focal deficit present.     Mental Status: She is alert. Mental status is at baseline.     Gait: Gait abnormal.     Comments: Oriented to person  Psychiatric:     Comments: Confused, pleasant, conversing.      Labs reviewed: Recent Labs    06/12/21 0000 10/22/21 0000 12/13/21 0000  NA 138 134* 135*  K 4.5 4.3 4.3  CL 101 96* 98*  CO2 35* 31* 25*  BUN 16 13 14   CREATININE 1.2* 1.2* 1.3*  CALCIUM 9.1 8.8 9.2   Recent Labs    06/12/21 0000 10/22/21 0000 12/13/21 0000  AST 10* 11* 12*  ALT 6* 7 6*  ALKPHOS 77 80 80  ALBUMIN 3.5 3.7 4.0   Recent Labs    06/12/21 0000 09/07/21 0000 12/13/21 0000  WBC 5.3 5.3 5.7  NEUTROABS 3,217.00 2,756.00 4,047.00  HGB 12.1 12.0 13.0  HCT 38 37 39  PLT 184 201 204   Lab Results  Component Value Date   TSH 2.70 05/15/2021   No results found for: "HGBA1C" No results found for: "CHOL", "HDL", "LDLCALC", "LDLDIRECT", "TRIG", "CHOLHDL"  Significant Diagnostic Results in last 30 days:  No results found.  Assessment/Plan Depression with anxiety Her mood is stable,  takes Lexpro, Clonazepam, Depakote, Quetiapine, Lorazepam, Hydroxyzine nightly. Na 135 12/13/21  CKD (chronic kidney disease) stage 3, GFR 30-59 ml/min (HCC) stable. Bun/creat 14/1.3 12/13/21  Elevated TSH Elevated TSH 6.72 02/15/21>>2.7 05/14/21.   Unsteady gait Gait abnormality,  furniture or walker, needs reminder for safety.   GERD (gastroesophageal reflux disease)  stable, taking Omeprazole. Hgb 13.0 12/13/21  Vascular dementia ([place])  Dementia/behavioral issues, takes Depakote and Seroquel. Vistaril hs. On Namenda, Donepezil. . 07/22/21 MMSE 21/30  Hypercalcemia Serum Ca 11.9 02/15/21>.9.2 12/13/21,  off Ca supplement, PTH<10     Family/ staff Communication: plan of care reviewed with the patient and charge nurse.   Labs/tests ordered:  none  Time spend 35 minutes.

## 2022-01-30 ENCOUNTER — Encounter: Payer: Self-pay | Admitting: Nurse Practitioner

## 2022-01-30 NOTE — Assessment & Plan Note (Signed)
Elevated TSH 6.72 02/15/21>>2.7 05/14/21.  

## 2022-01-30 NOTE — Assessment & Plan Note (Signed)
stable, taking Omeprazole. Hgb 13.0 12/13/21 

## 2022-01-30 NOTE — Assessment & Plan Note (Signed)
stable. Bun/creat 14/1.3 12/13/21

## 2022-01-30 NOTE — Assessment & Plan Note (Signed)
Serum Ca 11.9 02/15/21>.9.2 12/13/21,  off Ca supplement, PTH<10

## 2022-01-30 NOTE — Assessment & Plan Note (Signed)
Dementia/behavioral issues, takes Depakote and Seroquel. Vistaril hs. On Namenda, Donepezil. . 07/22/21 MMSE 21/30 

## 2022-01-30 NOTE — Assessment & Plan Note (Signed)
Her mood is stable,  takes Lexpro, Clonazepam, Depakote, Quetiapine, Lorazepam, Hydroxyzine nightly. Na 135 12/13/21

## 2022-01-30 NOTE — Assessment & Plan Note (Signed)
Gait abnormality, furniture or walker, needs reminder for safety.

## 2022-02-19 ENCOUNTER — Emergency Department (HOSPITAL_COMMUNITY): Payer: Medicare Other

## 2022-02-19 ENCOUNTER — Encounter: Payer: Self-pay | Admitting: Adult Health

## 2022-02-19 ENCOUNTER — Non-Acute Institutional Stay (SKILLED_NURSING_FACILITY): Payer: Medicare Other | Admitting: Adult Health

## 2022-02-19 ENCOUNTER — Emergency Department (HOSPITAL_COMMUNITY)
Admission: EM | Admit: 2022-02-19 | Discharge: 2022-02-19 | Disposition: A | Discharge status: Skilled Nursing Facility | Payer: Medicare Other | Attending: Emergency Medicine | Admitting: Emergency Medicine

## 2022-02-19 ENCOUNTER — Encounter (HOSPITAL_COMMUNITY): Payer: Self-pay

## 2022-02-19 DIAGNOSIS — N183 Chronic kidney disease, stage 3 unspecified: Secondary | ICD-10-CM | POA: Diagnosis not present

## 2022-02-19 DIAGNOSIS — Z20822 Contact with and (suspected) exposure to covid-19: Secondary | ICD-10-CM | POA: Insufficient documentation

## 2022-02-19 DIAGNOSIS — R112 Nausea with vomiting, unspecified: Secondary | ICD-10-CM

## 2022-02-19 DIAGNOSIS — R109 Unspecified abdominal pain: Secondary | ICD-10-CM | POA: Diagnosis not present

## 2022-02-19 DIAGNOSIS — K219 Gastro-esophageal reflux disease without esophagitis: Secondary | ICD-10-CM

## 2022-02-19 DIAGNOSIS — F01C18 Vascular dementia, severe, with other behavioral disturbance: Secondary | ICD-10-CM | POA: Diagnosis not present

## 2022-02-19 DIAGNOSIS — F418 Other specified anxiety disorders: Secondary | ICD-10-CM | POA: Diagnosis not present

## 2022-02-19 LAB — URINALYSIS, ROUTINE W REFLEX MICROSCOPIC
Bilirubin Urine: NEGATIVE
Glucose, UA: NEGATIVE mg/dL
Hgb urine dipstick: NEGATIVE
Ketones, ur: 5 mg/dL — AB
Leukocytes,Ua: NEGATIVE
Nitrite: NEGATIVE
Protein, ur: NEGATIVE mg/dL
Specific Gravity, Urine: 1.038 — ABNORMAL HIGH (ref 1.005–1.030)
pH: 6 (ref 5.0–8.0)

## 2022-02-19 LAB — CBC
HCT: 40 % (ref 36.0–46.0)
Hemoglobin: 13.5 g/dL (ref 12.0–15.0)
MCH: 29.5 pg (ref 26.0–34.0)
MCHC: 33.8 g/dL (ref 30.0–36.0)
MCV: 87.5 fL (ref 80.0–100.0)
Platelets: 214 10*3/uL (ref 150–400)
RBC: 4.57 MIL/uL (ref 3.87–5.11)
RDW: 13.8 % (ref 11.5–15.5)
WBC: 10.4 10*3/uL (ref 4.0–10.5)
nRBC: 0 % (ref 0.0–0.2)

## 2022-02-19 LAB — COMPREHENSIVE METABOLIC PANEL
ALT: 9 U/L (ref 0–44)
AST: 18 U/L (ref 15–41)
Albumin: 2.8 g/dL — ABNORMAL LOW (ref 3.5–5.0)
Alkaline Phosphatase: 62 U/L (ref 38–126)
Anion gap: 7 (ref 5–15)
BUN: 14 mg/dL (ref 8–23)
CO2: 24 mmol/L (ref 22–32)
Calcium: 7.7 mg/dL — ABNORMAL LOW (ref 8.9–10.3)
Chloride: 106 mmol/L (ref 98–111)
Creatinine, Ser: 0.86 mg/dL (ref 0.44–1.00)
GFR, Estimated: >60 mL/min (ref >60)
Glucose, Bld: 117 mg/dL — ABNORMAL HIGH (ref 70–99)
Potassium: 3.9 mmol/L (ref 3.5–5.1)
Sodium: 137 mmol/L (ref 135–145)
Total Bilirubin: 0.7 mg/dL (ref 0.3–1.2)
Total Protein: 6.2 g/dL — ABNORMAL LOW (ref 6.5–8.1)

## 2022-02-19 LAB — LIPASE, BLOOD: Lipase: 24 U/L (ref 11–51)

## 2022-02-19 LAB — SARS CORONAVIRUS 2 BY RT PCR: SARS Coronavirus 2 by RT PCR: NEGATIVE

## 2022-02-19 MED ORDER — ONDANSETRON HCL 4 MG PO TABS: 4.0000 mg | ORAL_TABLET | Freq: Three times a day (TID) | ORAL | 0 refills | Status: DC | PRN

## 2022-02-19 MED ORDER — SODIUM CHLORIDE (PF) 0.9 % IJ SOLN
INTRAMUSCULAR | Status: AC
  Filled 2022-02-19: qty 50

## 2022-02-19 MED ORDER — IOHEXOL 300 MG/ML  SOLN
100.0000 mL | Freq: Once | INTRAMUSCULAR | Status: AC | PRN
  Administered 2022-02-19: 100 mL via INTRAVENOUS

## 2022-02-19 MED ORDER — ONDANSETRON HCL 4 MG/2ML IJ SOLN
4.0000 mg | Freq: Once | INTRAMUSCULAR | Status: AC
  Administered 2022-02-19: 4 mg via INTRAVENOUS
  Filled 2022-02-19: qty 2

## 2022-02-19 NOTE — ED Provider Notes (Signed)
[facility] DEPT Provider Note   CSN: [id number] Arrival date & time: 02/19/22  0047     History  Chief Complaint  Patient presents with   Abdominal Pain   Emesis   Sarah Larsen is a 88 y.o. female who [facility] with concern for multiple episodes of vomiting today with NBNB emesis and report of some generalized abdominal pain. Patient with advanced vascular dementia, unable to contribute any history to me at time of my presentation to the room.  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  CAVEAT due to patient's underlying dementia and inability to contribute to her history.  I have personally reviewed her medical records.  In addition to the vascular dementia she has history of hyponatremia, hypokalemia and CKD stage III, GERD, hyperlipidemia.  She is not anticoagulated.  HPI     Home Medications Prior to Admission medications   Medication Sig Start Date End Date Taking? Authorizing Provider  divalproex (DEPAKOTE) 125 MG DR tablet Take 125 mg by mouth 3 (three) times daily.   Yes [provider]  donepezil (ARICEPT) 5 MG tablet Take 5 mg by mouth 2 (two) times daily.   Yes [provider]  escitalopram (LEXAPRO) 10 MG tablet Take 10 mg by mouth daily.   Yes [provider]  LORazepam (ATIVAN) 0.5 MG tablet Take 1 tablet (0.5 mg total) by mouth every 8 (eight) hours as needed for anxiety. 10/24/21  Yes Medina-Vargas, Monina C, NP  memantine (NAMENDA) 10 MG tablet Take 10 mg by mouth 2 (two) times daily.   Yes [provider]  omeprazole (PRILOSEC) 20 MG capsule Take 20 mg by mouth daily.   Yes [provider]  POLYVINYL ALCOHOL-POVIDONE OP Place 2 drops  into both eyes 3 (three) times daily. drops; 0.5-0.6 %; amt: 2 drops each eye; ophthalmic (eye)   Yes [provider]  promethazine (PHENERGAN) 25 MG tablet Take 25 mg by mouth every 6 (six) hours as needed for nausea or vomiting.   Yes [provider]  promethazine (PHENERGAN) 25 MG/ML injection Inject 25 mg into the vein every 6 (six) hours as needed for nausea or vomiting.   Yes [provider]  QUEtiapine (SEROQUEL) 25 MG tablet Take 25 mg by mouth 2 (two) times daily.   Yes [provider]  clonazePAM (KLONOPIN) 0.5 MG tablet Take 1 tablet (0.5 mg total) by mouth at bedtime. 11/27/21   Medina-Vargas, Monina C, NP  hydrOXYzine (VISTARIL) 25 MG capsule Take 25 mg by mouth at bedtime.    [provider]      Allergies    Fish-derived products and Pravachol [pravastatin sodium]    Review of Systems   Review of Systems  Unable to perform ROS: Dementia    Physical Exam Updated Vital Signs BP 109/63   Pulse 84   Temp 98 F (36.7 C) (Oral)   Resp 15   SpO2 97%  Physical Exam Vitals and nursing note reviewed.  Constitutional:      Appearance: She is obese. She is not ill-appearing or toxic-appearing.  HENT:     Head: Normocephalic and atraumatic.     Mouth/Throat:     Mouth: Mucous  membranes are moist.     Pharynx: No oropharyngeal exudate or posterior oropharyngeal erythema.  Eyes:     General:        Right eye: No discharge.        Left eye: No discharge.     Conjunctiva/sclera: Conjunctivae normal.  Cardiovascular:     Rate and Rhythm: Normal rate and regular rhythm.     Pulses: Normal pulses.     Heart sounds: Normal heart sounds.  Pulmonary:     Effort: Pulmonary effort is normal. No respiratory distress.     Breath sounds: Normal breath sounds. No wheezing or rales.  Abdominal:     General: Bowel sounds are normal. There is no distension.     Tenderness: There is no abdominal tenderness. There is no right CVA tenderness, left  CVA tenderness, guarding or rebound.  Musculoskeletal:        General: No deformity.     Cervical back: Neck supple.  Skin:    General: Skin is warm and dry.     Capillary Refill: Capillary refill takes less than 2 seconds.  Neurological:     General: No focal deficit present.     Mental Status: She is alert and oriented to person, place, and time. Mental status is at baseline.  Psychiatric:        Mood and Affect: Mood normal.     ED Results / Procedures / Treatments   Labs (all labs ordered are listed, but only abnormal results are displayed) Labs Reviewed  URINALYSIS, ROUTINE W REFLEX MICROSCOPIC - Abnormal; Notable for the following components:      Result Value   APPearance HAZY (*)    Specific Gravity, Urine 1.038 (*)    Ketones, ur 5 (*)    All other components within normal limits  COMPREHENSIVE METABOLIC PANEL - Abnormal; Notable for the following components:   Glucose, Bld 117 (*)    Calcium 7.7 (*)    Total Protein 6.2 (*)    Albumin 2.8 (*)    All other components within normal limits  SARS CORONAVIRUS 2 BY RT PCR  URINE CULTURE  CBC  LIPASE, BLOOD    EKG EKG Interpretation  Date/Time:  Wednesday February 19 2022 05:21:36 EDT Ventricular Rate:  84 PR Interval:  231 QRS Duration: 94 QT Interval:  392 QTC Calculation: 464 R Axis:   -10 Text Interpretation: Sinus rhythm Prolonged PR interval Low voltage, precordial leads Confirmed by Addison Lank [phone]) on 02/19/2022 6:10:34 AM  Radiology CT ABDOMEN PELVIS W CONTRAST  Result Date: 02/19/2022 CLINICAL DATA:  88 year old female with history of acute onset of nonlocalized abdominal pain with nausea and vomiting for 1 day. EXAM: CT ABDOMEN AND PELVIS WITH CONTRAST TECHNIQUE: Multidetector CT imaging of the abdomen and pelvis was performed using the standard protocol following bolus administration of intravenous contrast. RADIATION DOSE REDUCTION: This exam was performed according to the departmental  dose-optimization program which includes automated exposure control, adjustment of the mA and/or kV according to patient size and/or use of iterative reconstruction technique. CONTRAST:  129mL OMNIPAQUE IOHEXOL 300 MG/ML  SOLN COMPARISON:  CT of the abdomen and pelvis 05/12/2020. FINDINGS: Lower chest: Atherosclerotic calcifications in the descending thoracic aorta as well as the right coronary artery. Hepatobiliary: Well-defined 8.2 x 6.0 x 3.6 cm low-attenuation collection beneath the right hemidiaphragm adjacent to the superior aspect of the right lobe of the liver predominantly next to segment 8 (axial image 12 of series 2 and coronal image 69  of series 4), larger than remote prior study 05/12/2020. Previously noted indwelling catheter has been removed since that time. Remainder of the liver is otherwise unremarkable in appearance, with no new suspicious appearing solid hepatic lesions. No intra or extrahepatic biliary ductal dilatation. Status post cholecystectomy. Pancreas: No pancreatic mass. No pancreatic ductal dilatation. No pancreatic or peripancreatic fluid collections or inflammatory changes. Spleen: Unremarkable. Adrenals/Urinary Tract: Bilateral kidneys and adrenal glands are normal in appearance. No hydroureteronephrosis. Urinary bladder is normal in appearance. Stomach/Bowel: The appearance of the stomach is normal. There is no pathologic dilatation of small bowel or colon. Numerous colonic diverticuli are noted, without focal surrounding inflammatory changes to indicate an acute diverticulitis at this time. Postoperative changes in the sigmoid colon of prior partial colectomy with what appears to be an end-to-side anastomosis. The appendix is not confidently identified and may be surgically absent. Regardless, there are no inflammatory changes noted adjacent to the cecum to suggest the presence of an acute appendicitis at this time. Vascular/Lymphatic: Atherosclerotic calcifications throughout the  abdominal aorta and pelvic vasculature, without evidence of aneurysm or dissection. No lymphadenopathy noted in the abdomen or pelvis. Reproductive: Uterus and ovaries are atrophic. Coarse calcifications in the uterine body, likely reflective of a small fibroid. Other: No significant volume of ascites.  No pneumoperitoneum. Musculoskeletal: There are no aggressive appearing lytic or blastic lesions noted in the visualized portions of the skeleton. IMPRESSION: 1. Subcapsular fluid collection adjacent to the superior aspect of the right lobe of the liver. This is unusual in appearance, but favored to be chronic given the presence of an indwelling catheter in this region on remote prior CT scan 05/12/2020. Although this may simply represent a chronic sterile fluid collection such as a seroma, the possibility of recurrent abscess is not excluded. Further clinical evaluation is recommended. 2. No other acute findings are noted elsewhere in the abdomen or pelvis to account for the patient's symptoms. 3. Extensive colonic diverticulosis without evidence of acute diverticulitis at this time. 4. Aortic atherosclerosis, in addition to at least right coronary artery disease. 5. Additional incidental findings, as above. Electronically Signed   By: Vinnie Langton M.D.   On: 02/19/2022 05:21    Procedures Procedures    Medications Ordered in ED Medications  ondansetron (ZOFRAN) injection 4 mg (4 mg Intravenous Given 02/19/22 0203)  sodium chloride (PF) 0.9 % injection (  Given by Other 02/19/22 0314)  iohexol (OMNIPAQUE) 300 MG/ML solution 100 mL (100 mLs Intravenous Contrast Given 02/19/22 0452)    ED Course/ Medical Decision Making/ A&P Clinical Course as of 02/19/22 0717  Wed Feb 19, 2022  0608 Total Protein(!): 6.2 [RS]  0709 Consult to general surgery Dr. Brantley Stage by EDP Dr. Leonette Monarch; agrees with likely diagnosis of seroma on CT, no indication for admission at this time.  May follow-up in the outpatient setting  and return precautions will be given.  Appreciate their collaboration in care of this patient. [RS]    Clinical Course User Index [RS] Antonis Lor, Gypsy Balsam, PA-C                           Medical Decision Making 88 year old female presents with concern for vomiting.  Vital signs are normal throughout her stay in the ED.  Cardiopulmonary exams unremarkable, abdominal exam is benign.  Patient neurovascularly intact in all extremities, at mental status baseline per EMS report.  Different diagnosis includes not limited to UTI, intra-abdominal infection such as diverticulitis, appendicitis,  pyelonephritis, colitis.  Must also consider viral etiology such as COVID-19 or influenza particularly given patient's residence in nursing facility.  Amount and/or Complexity of Data Reviewed Labs: ordered. Decision-making details documented in ED Course.    Details: CBC with leukocytosis or anemia.  CMP with low albumin at 2.8, low calcium at 7.7, corrected for hypoalbuminemia is 8.7.  Radiology: ordered.    Details: CT abdomen pelvis with fluid collection in the liver at the same site of prior abscess in 2021.  Case discussed with Dr. Leonette Monarch general surgery as above.    Risk Prescription drug management.   As patient's abdominal exams remained benign throughout her stay in the emergency department, she does not have a leukocytosis she is not febrile and she is very well-appearing tolerating p.o. at this time no further work-up is warranted in the ER.  Suspect acute viral etiology for her vomiting yesterday.  May have Zofran at her facility as needed.  Work-up and disposition discussed with patient's POA Arnoldo Morale via phone who voiced understanding of these medical evaluations and treatment plan.  Each of her questions answered to her expressed satisfaction.  Return precautions given to patient develop any abdominal pain or fever in context of fluid collection identified today. Patient is  well-appearing, stable, and was discharged in good condition.  This chart was dictated using voice recognition software, Dragon. Despite the best efforts of this provider to proofread and correct errors, errors may still occur which can change documentation meaning.  Final Clinical Impression(s) / ED Diagnoses Final diagnoses:  Nausea and vomiting, unspecified vomiting type    Rx / DC Orders ED Discharge Orders     None         Aura Dials 02/19/22 4917    Fatima Blank, MD 02/19/22 [phone]

## 2022-02-19 NOTE — Progress Notes (Signed)
This encounter was created in error - please disregard.

## 2022-02-19 NOTE — ED Notes (Signed)
Lab called needing a recollect, new sample sent to lab.

## 2022-02-19 NOTE — Discharge Instructions (Addendum)
Sarah Larsen was seen in the ER today for vomiting.  Your work-up was very reassuring.  She may have nausea medication with Zofran at her facility as needed.  She does have a fluid collection on her CT scan in her liver, though this does not appear to be infected, this was discussed with her power of attorney via phone who voiced understanding.  Return to the ER if she develops any fevers, new abdominal pain, or any other history symptom.

## 2022-02-19 NOTE — Progress Notes (Addendum)
Location:  [place] of Service:  [facility]  Provider:  Durenda Age, [place], FNP-BC  Patient Care Team: Mast, Man X, NP as PCP - General (Internal Medicine)  Extended Emergency Contact Information Primary Emergency Contact: Krista Blue Address: [place], [place] [address] [name] of [place] Phone: [phone] Mobile Phone: [phone] Relation: Relative Secondary Emergency Contact: West,Doug Home Phone: [phone] Mobile Phone: [phone] Relation: Relative  Code Status: DNR  Goals of care: Advanced Directive information    02/19/2022   11:55 AM  Advanced Directives  Does Patient Have a Medical Advance Directive? Yes  Type of Paramedic of [place];Living will;Out of facility DNR (pink MOST or yellow form)  Does patient want to make changes to medical advance directive? No - Patient declined  Copy of [place] in Chart? Yes - validated most recent copy scanned in chart (See row information)  Pre-existing out of facility DNR order (yellow form or pink MOST form) Yellow form placed in chart (order not valid for inpatient use)     Chief Complaint  Patient presents with   Acute Visit    Follow up ED visit      HPI:  Pt is a 88 y.o. female seen today for an acute visit to follow up ED visit on 02/19/22. She is a long-term care resident of [facility] with PMH of  vascular dementia, history of hyponatremia, hypokalemia and CKD stage III, GERD and hyperlipidemia.Marland Kitchen She was sent to ED due to multiple episodes of vomiting with generalized abdominal pain. CT abdomen pelvis with fluid collection in the liver at the same site of prior abscess in 2021. This was thought to be a seroma per surgery consult. She does not have leukocytosis or fever. Vomiting was suspected to be due an acute viral etiology. She was discharged back to  facility with PRN Zofran.  She was seen in her room today. She denies abdominal pain. No further episode of vomiting.  Past Medical History:  Diagnosis Date   Arthritis    Dementia ([place])    Hx of concussion    [place]   Hyperlipidemia    Vitamin D deficiency, unspecified    Past Surgical History:  Procedure Laterality Date   CHOLECYSTECTOMY      Allergies  Allergen Reactions   Fish-Derived Products    Pravachol [Pravastatin Sodium]     Outpatient Encounter Medications as of 02/19/2022  Medication Sig   clonazePAM (KLONOPIN) 0.5 MG tablet Take 1 tablet (0.5 mg total) by mouth at bedtime.   divalproex (DEPAKOTE) 125 MG DR tablet Take 125 mg by mouth 3 (three) times daily. * DO NOT CRUSH OR CHEW* *HAZARDOUS DRUG: WEAR GLOVES*   donepezil (ARICEPT) 5 MG tablet Take 5 mg by mouth 2 (two) times daily.   escitalopram (LEXAPRO) 10 MG tablet Take 10 mg by mouth daily.   hydrOXYzine (VISTARIL) 25 MG capsule Take 25 mg by mouth at bedtime.   LORazepam (ATIVAN) 0.5 MG tablet Take 0.5 mg by mouth every 8 (eight) hours.   memantine (NAMENDA) 10 MG tablet Take 10 mg by mouth 2 (two) times daily.   omeprazole (PRILOSEC) 20 MG capsule Take 20 mg by mouth daily.   ondansetron (ZOFRAN) 4 MG tablet Take 1 tablet (4 mg total) by mouth every 8 (eight) hours as needed for nausea or  vomiting.   POLYVINYL ALCOHOL-POVIDONE OP Place 2 drops into both eyes 3 (three) times daily. drops; 0.5-0.6 %; amt: 2 drops each eye; ophthalmic (eye)   promethazine (PHENERGAN) 25 MG/ML injection Inject 25 mg into the vein every 6 (six) hours as needed for nausea or vomiting.   QUEtiapine (SEROQUEL) 25 MG tablet Take 25 mg by mouth 2 (two) times daily.   [DISCONTINUED] LORazepam (ATIVAN) 0.5 MG tablet Take 1 tablet (0.5 mg total) by mouth every 8 (eight) hours as needed for anxiety.   [DISCONTINUED] promethazine (PHENERGAN) 25 MG tablet Take 25 mg by mouth every 6 (six) hours as needed for nausea  or vomiting.   No facility-administered encounter medications on file as of 02/19/2022.    Review of Systems   Unable to obtain  due to dementia.    Immunization History  Administered Date(s) Administered   Influenza-Unspecified 02/26/2019, 03/07/2020, 03/13/2021   Moderna Sars-Covid-2 Vaccination 06/25/2019, 07/23/2019, 04/03/2020, 10/23/2020, 10/11/2021   PFIZER(Purple Top)SARS-COV-2 Vaccination 02/12/2021   Pneumococcal Conjugate-13 12/31/2018   Pneumococcal Polysaccharide-23 02/24/2004   Pneumococcal-Unspecified 05/22/2016   Tdap 10/03/2017, 01/13/2021   Zoster Recombinat (Shingrix) 04/29/2021   Zoster, Live 05/26/2009   Zoster, Unspecified 05/26/2009   Pertinent  Health Maintenance Due  Topic Date Due   DEXA SCAN  Never done   INFLUENZA VACCINE  12/24/2021      05/13/2020    8:00 PM 05/14/2020    8:07 AM 05/22/2020    7:52 PM 01/13/2021    6:42 PM 02/19/2022   12:53 AM  Fall Risk  Patient Fall Risk Level High fall risk High fall risk High fall risk Moderate fall risk High fall risk     Vitals:   02/19/22 1441  BP: (!) 142/77  Pulse: 81  Resp: 18  Temp: (!) 97.3 F (36.3 C)  SpO2: 96%  Weight: 174 lb 3.2 oz (79 kg)  Height: 5\' 6"  (1.676 m)   Body mass index is 28.12 kg/m.  Physical Exam Constitutional:      Appearance: Normal appearance.  HENT:     Head: Normocephalic and atraumatic.     Nose: Nose normal.     Mouth/Throat:     Mouth: Mucous membranes are moist.  Eyes:     Conjunctiva/sclera: Conjunctivae normal.  Cardiovascular:     Rate and Rhythm: Normal rate and regular rhythm.  Pulmonary:     Effort: Pulmonary effort is normal.     Breath sounds: Normal breath sounds.  Abdominal:     General: Bowel sounds are normal.     Palpations: Abdomen is soft.  Musculoskeletal:        General: Normal range of motion.     Cervical back: Normal range of motion.  Skin:    General: Skin is warm and dry.  Neurological:     General: No focal deficit  present.     Mental Status: She is alert. She is disoriented.  Psychiatric:        Mood and Affect: Mood normal.        Behavior: Behavior normal.      Labs reviewed: Recent Labs    10/22/21 0000 12/13/21 0000 02/19/22 0235  NA 134* 135* 137  K 4.3 4.3  4.3 3.9  CL 96* 98*  98* 106  CO2 31* 25*  25* 24  GLUCOSE  --   --  117*  BUN 13 14 14   CREATININE 1.2* 1.3*  1.3* 0.86  CALCIUM 8.8 9.2  9.2 7.7*   Recent  Labs    10/22/21 0000 12/13/21 0000 02/19/22 0235  AST 11* 12* 18  ALT 7 6* 9  ALKPHOS 80 80 62  BILITOT  --   --  0.7  PROT  --   --  6.2*  ALBUMIN 3.7 4.0 2.8*   Recent Labs    06/12/21 0000 09/07/21 0000 12/13/21 0000 02/19/22 0116  WBC 5.3 5.3 5.7 10.4  NEUTROABS 3,217.00 2,756.00 4,047.00  --   HGB 12.1 12.0 13.0 13.5  HCT 38 37 39 40.0  MCV  --   --   --  87.5  PLT 184 201 204 214   Lab Results  Component Value Date   TSH 2.70 05/15/2021   No results found for: "HGBA1C" No results found for: "CHOL", "HDL", "LDLCALC", "LDLDIRECT", "TRIG", "CHOLHDL"  Significant Diagnostic Results in last 30 days:  CT ABDOMEN PELVIS W CONTRAST  Result Date: 02/19/2022 CLINICAL DATA:  88 year old female with history of acute onset of nonlocalized abdominal pain with nausea and vomiting for 1 day. EXAM: CT ABDOMEN AND PELVIS WITH CONTRAST TECHNIQUE: Multidetector CT imaging of the abdomen and pelvis was performed using the standard protocol following bolus administration of intravenous contrast. RADIATION DOSE REDUCTION: This exam was performed according to the departmental dose-optimization program which includes automated exposure control, adjustment of the mA and/or kV according to patient size and/or use of iterative reconstruction technique. CONTRAST:  149mL OMNIPAQUE IOHEXOL 300 MG/ML  SOLN COMPARISON:  CT of the abdomen and pelvis 05/12/2020. FINDINGS: Lower chest: Atherosclerotic calcifications in the descending thoracic aorta as well as the right coronary  artery. Hepatobiliary: Well-defined 8.2 x 6.0 x 3.6 cm low-attenuation collection beneath the right hemidiaphragm adjacent to the superior aspect of the right lobe of the liver predominantly next to segment 8 (axial image 12 of series 2 and coronal image 69 of series 4), larger than remote prior study 05/12/2020. Previously noted indwelling catheter has been removed since that time. Remainder of the liver is otherwise unremarkable in appearance, with no new suspicious appearing solid hepatic lesions. No intra or extrahepatic biliary ductal dilatation. Status post cholecystectomy. Pancreas: No pancreatic mass. No pancreatic ductal dilatation. No pancreatic or peripancreatic fluid collections or inflammatory changes. Spleen: Unremarkable. Adrenals/Urinary Tract: Bilateral kidneys and adrenal glands are normal in appearance. No hydroureteronephrosis. Urinary bladder is normal in appearance. Stomach/Bowel: The appearance of the stomach is normal. There is no pathologic dilatation of small bowel or colon. Numerous colonic diverticuli are noted, without focal surrounding inflammatory changes to indicate an acute diverticulitis at this time. Postoperative changes in the sigmoid colon of prior partial colectomy with what appears to be an end-to-side anastomosis. The appendix is not confidently identified and may be surgically absent. Regardless, there are no inflammatory changes noted adjacent to the cecum to suggest the presence of an acute appendicitis at this time. Vascular/Lymphatic: Atherosclerotic calcifications throughout the abdominal aorta and pelvic vasculature, without evidence of aneurysm or dissection. No lymphadenopathy noted in the abdomen or pelvis. Reproductive: Uterus and ovaries are atrophic. Coarse calcifications in the uterine body, likely reflective of a small fibroid. Other: No significant volume of ascites.  No pneumoperitoneum. Musculoskeletal: There are no aggressive appearing lytic or blastic  lesions noted in the visualized portions of the skeleton. IMPRESSION: 1. Subcapsular fluid collection adjacent to the superior aspect of the right lobe of the liver. This is unusual in appearance, but favored to be chronic given the presence of an indwelling catheter in this region on remote prior CT scan 05/12/2020. Although  this may simply represent a chronic sterile fluid collection such as a seroma, the possibility of recurrent abscess is not excluded. Further clinical evaluation is recommended. 2. No other acute findings are noted elsewhere in the abdomen or pelvis to account for the patient's symptoms. 3. Extensive colonic diverticulosis without evidence of acute diverticulitis at this time. 4. Aortic atherosclerosis, in addition to at least right coronary artery disease. 5. Additional incidental findings, as above. Electronically Signed   By: Vinnie Langton M.D.   On: 02/19/2022 05:21    Assessment/Plan  1. Nausea and vomiting, unspecified vomiting type -   CT abdomen pelvis with fluid collection in the liver at the same site of prior abscess in 2021. This was thought to be a seroma per surgery consult. She does not have leukocytosis or fever. Vomiting was suspected to be due an acute viral etiology - ondansetron (ZOFRAN) 4 MG tablet; Take 1 tablet (4 mg total) by mouth every 8 (eight) hours as needed for nausea or vomiting.  Dispense: 30 tablet; Refill: 0  2. Depression with anxiety -   PHQ-9 score 4, ranging in minimal depression -  continue Lexapro, Seroquel, Ativan PRN and Klonopin  3. Gastroesophageal reflux disease, unspecified whether esophagitis present -  continue Omeprazole  4. Severe vascular dementia with other behavioral disturbance ([place]) -  BIMS score 6/15, ranging in severe cognitive impairment -   Continue Aricept and memantine -   Continue supportive care    Family/ staff Communication: Discussed plan of care with charge nurse  Labs/tests ordered:  None    Durenda Age, DNP, MSN, FNP-BC [facility] [phone] (Monday-Friday 8:00 a.m. - 5:00 p.m.) [phone] (after hours)

## 2022-02-19 NOTE — ED Triage Notes (Signed)
Patient arrived with complaints of generalized abdominal pain and vomiting throughout the day.

## 2022-02-19 NOTE — ED Notes (Signed)
Lab called stating blood hemolyzed again, third tube sent to lab.

## 2022-02-21 LAB — URINE CULTURE: Culture: >=100000 — AB

## 2022-02-22 ENCOUNTER — Telehealth (HOSPITAL_BASED_OUTPATIENT_CLINIC_OR_DEPARTMENT_OTHER): Payer: Self-pay | Admitting: *Deleted

## 2022-02-22 NOTE — Progress Notes (Signed)
**Note Sarah-Identified via Obfuscation** ED Antimicrobial Stewardship Positive Culture Follow Up   Sarah Larsen is an 88 y.o. female who presented to [facility] on 02/19/2022 with a chief complaint of  Chief Complaint  Patient presents with   Abdominal Pain   Emesis    Recent Results (from the past 720 hour(s))  SARS Coronavirus 2 by RT PCR (hospital order, performed in [facility] hospital lab) *cepheid single result test*     Status: None   Collection Time: 02/19/22  4:55 AM   Specimen: Nasal Swab  Result Value Ref Range Status   SARS Coronavirus 2 by RT PCR NEGATIVE NEGATIVE Final    Comment: (NOTE) SARS-CoV-2 target nucleic acids are NOT DETECTED.  The SARS-CoV-2 RNA is generally detectable in upper and lower respiratory specimens during the acute phase of infection. The lowest concentration of SARS-CoV-2 viral copies this assay can detect is 250 copies / mL. A negative result does not preclude SARS-CoV-2 infection and should not be used as the sole basis for treatment or other patient management decisions.  A negative result may occur with improper specimen collection / handling, submission of specimen other than nasopharyngeal swab, presence of viral mutation(s) within the areas targeted by this assay, and inadequate number of viral copies (<250 copies / mL). A negative result must be combined with clinical observations, patient history, and epidemiological information.  Fact Sheet for Patients:   RoadLapTop.co.za  Fact Sheet for Healthcare Providers: http://kim-miller.com/  This test is not yet approved or  cleared by the [place] FDA and has been authorized for detection and/or diagnosis of SARS-CoV-2 by FDA under an Emergency Use Authorization (EUA).  This EUA will remain in effect (meaning this test can be used) for the duration of the COVID-19 declaration under Section 564(b)(1) of the Act, 21 U.S.C. section 360bbb-3(b)(1), unless the authorization is  terminated or revoked sooner.  Performed at [facility], [address] [place], [place] [address]   Urine Culture     Status: Abnormal   Collection Time: 02/19/22  6:12 AM   Specimen: In/Out Cath Urine  Result Value Ref Range Status   Specimen Description   Final    IN/OUT CATH URINE Performed at [facility], [address], [place], [place] [address]    Special Requests   Final    NONE Performed at [facility], [address], [place], [place] [address]    Culture >=100,000 COLONIES/mL ESCHERICHIA COLI (A)  Final   Report Status 02/21/2022 FINAL  Final   Organism ID, Bacteria ESCHERICHIA COLI (A)  Final      Susceptibility   Escherichia coli - MIC*    AMPICILLIN >=32 RESISTANT Resistant     CEFAZOLIN <=4 SENSITIVE Sensitive     CEFEPIME <=0.12 SENSITIVE Sensitive     CEFTRIAXONE <=0.25 SENSITIVE Sensitive     CIPROFLOXACIN <=0.25 SENSITIVE Sensitive     GENTAMICIN <=1 SENSITIVE Sensitive     IMIPENEM <=0.25 SENSITIVE Sensitive     NITROFURANTOIN <=16 SENSITIVE Sensitive     TRIMETH/SULFA <=20 SENSITIVE Sensitive     AMPICILLIN/SULBACTAM 16 INTERMEDIATE Intermediate     PIP/TAZO <=4 SENSITIVE Sensitive     * >=100,000 COLONIES/mL ESCHERICHIA COLI    []  Treated with , organism resistant to prescribed antimicrobial [x]  Patient discharged originally without antimicrobial agent and treatment is now indicated  New antibiotic prescription: keflex 500mg  PO q12h x 5 days  ED Provider: , PA-C   , PharmD, BCPS 02/22/2022, 9:01  AM Clinical Pharmacist [phone]

## 2022-02-24 ENCOUNTER — Telehealth (HOSPITAL_BASED_OUTPATIENT_CLINIC_OR_DEPARTMENT_OTHER): Payer: Self-pay | Admitting: Emergency Medicine

## 2022-02-24 NOTE — Telephone Encounter (Signed)
Order faxed to [phone]

## 2022-02-27 ENCOUNTER — Telehealth (HOSPITAL_BASED_OUTPATIENT_CLINIC_OR_DEPARTMENT_OTHER): Payer: Self-pay | Admitting: *Deleted

## 2022-03-03 ENCOUNTER — Encounter: Payer: Self-pay | Admitting: Nurse Practitioner

## 2022-03-03 ENCOUNTER — Non-Acute Institutional Stay (SKILLED_NURSING_FACILITY): Payer: Medicare Other | Admitting: Nurse Practitioner

## 2022-03-03 DIAGNOSIS — F418 Other specified anxiety disorders: Secondary | ICD-10-CM

## 2022-03-03 DIAGNOSIS — F01C18 Vascular dementia, severe, with other behavioral disturbance: Secondary | ICD-10-CM

## 2022-03-03 DIAGNOSIS — R2681 Unsteadiness on feet: Secondary | ICD-10-CM | POA: Diagnosis not present

## 2022-03-03 DIAGNOSIS — N1831 Chronic kidney disease, stage 3a: Secondary | ICD-10-CM

## 2022-03-03 DIAGNOSIS — K219 Gastro-esophageal reflux disease without esophagitis: Secondary | ICD-10-CM

## 2022-03-03 DIAGNOSIS — R7989 Other specified abnormal findings of blood chemistry: Secondary | ICD-10-CM | POA: Diagnosis not present

## 2022-03-03 NOTE — Assessment & Plan Note (Signed)
stable. Bun/creat 14/0.86 02/19/22

## 2022-03-03 NOTE — Assessment & Plan Note (Signed)
takes Depakote and Seroquel. Vistaril hs. On Namenda, Donepezil. . 07/22/21 MMSE 21/30

## 2022-03-03 NOTE — Assessment & Plan Note (Signed)
TSH 6.72 02/15/21>>2.7 05/14/21.

## 2022-03-03 NOTE — Assessment & Plan Note (Signed)
takes Lexpro, Clonazepam, Depakote, Quetiapine, Lorazepam, Hydroxyzine nightly. Na 137 02/19/22

## 2022-03-03 NOTE — Assessment & Plan Note (Signed)
stable, taking Omeprazole. Hgb 13.5 02/19/22

## 2022-03-03 NOTE — Assessment & Plan Note (Signed)
furniture or walker, needs reminder for safety.

## 2022-03-03 NOTE — Progress Notes (Signed)
Location:   [facility] Room Number: [id number] of Service:  SNF (31) Provider: Lennie Odor Jalynn Betzold NP  Montavis Schubring X, NP  Patient Care Team: Glori Machnik X, NP as PCP - General (Internal Medicine)  Extended Emergency Contact Information Primary Emergency Contact: Krista Blue Address: [place], [place] [address] [name] of [place] Phone: [phone] Mobile Phone: [phone] Relation: Relative Secondary Emergency Contact: West,Doug Home Phone: [phone] Mobile Phone: [phone] Relation: Relative  Code Status:  DNR Goals of care: Advanced Directive information    02/19/2022   11:55 AM  Advanced Directives  Does Patient Have a Medical Advance Directive? Yes  Type of Paramedic of [place];Living will;Out of facility DNR (pink MOST or yellow form)  Does patient want to make changes to medical advance directive? No - Patient declined  Copy of [place] in Chart? Yes - validated most recent copy scanned in chart (See row information)  Pre-existing out of facility DNR order (yellow form or pink MOST form) Yellow form placed in chart (order not valid for inpatient use)     Chief Complaint  Patient presents with  . Medical Management of Chronic Issues    HPI:  Pt is a 88 y.o. female seen today for medical management of chronic diseases.    Serum Ca 7.7 02/19/22,  off Ca supplement, PTH<10             Dementia/behavioral issues, takes Depakote and Seroquel. Vistaril hs. On Namenda, Donepezil. . 07/22/21 MMSE 21/30             Depression/anxiety, takes Lexpro, Clonazepam, Depakote, Quetiapine, Lorazepam, Hydroxyzine nightly. Na 137 02/19/22 CKD stable. Bun/creat 14/0.86 02/19/22 Elevated TSH 6.72 02/15/21>>2.7 05/14/21.  Gait abnormality, furniture or walker, needs reminder for safety.  GERD, stable, taking Omeprazole. Hgb 13.5 02/19/22 Seroma per surgeon consultation 02/19/22 CT abd/pelvis with  fluid collection in the liver at the same site of prior abscess in 2021              Past Medical History:  Diagnosis Date  . Arthritis   . Dementia ([place])   . Hx of concussion    [place]  . Hyperlipidemia   . Vitamin D deficiency, unspecified    Past Surgical History:  Procedure Laterality Date  . CHOLECYSTECTOMY      Allergies  Allergen Reactions  . Fish-Derived Products   . Pravachol [Pravastatin Sodium]     Allergies as of 03/03/2022       Reactions   Fish-derived Products    Pravachol [pravastatin Sodium]         Medication List        Accurate as of March 03, 2022  3:57 PM. If you have any questions, ask your nurse or doctor.          clonazePAM 0.5 MG tablet Commonly known as: KLONOPIN Take 1 tablet (0.5 mg total) by mouth at bedtime.   divalproex 125 MG DR tablet Commonly known as: DEPAKOTE Take 125 mg by mouth 3 (three) times daily. * DO NOT CRUSH OR CHEW* *HAZARDOUS DRUG: WEAR GLOVES*   donepezil 5 MG tablet Commonly known as: ARICEPT Take 5 mg by mouth 2 (two) times daily.   escitalopram 10 MG tablet Commonly known as: LEXAPRO Take 10 mg by mouth daily.   hydrOXYzine 25 MG capsule Commonly known as: VISTARIL Take 25 mg by mouth at bedtime.  LORazepam 0.5 MG tablet Commonly known as: ATIVAN Take 0.5 mg by mouth every 8 (eight) hours.   memantine 10 MG tablet Commonly known as: NAMENDA Take 10 mg by mouth 2 (two) times daily.   omeprazole 20 MG capsule Commonly known as: PRILOSEC Take 20 mg by mouth daily.   ondansetron 4 MG tablet Commonly known as: Zofran Take 1 tablet (4 mg total) by mouth every 8 (eight) hours as needed for nausea or vomiting.   POLYVINYL ALCOHOL-POVIDONE OP Place 2 drops into both eyes 3 (three) times daily. drops; 0.5-0.6 %; amt: 2 drops each eye; ophthalmic (eye)   promethazine 25 MG/ML injection Commonly known as: PHENERGAN Inject 25 mg into the vein every 6 (six) hours as  needed for nausea or vomiting.   QUEtiapine 25 MG tablet Commonly known as: SEROQUEL Take 25 mg by mouth 2 (two) times daily.        Review of Systems  Constitutional:  Negative for appetite change, fatigue and fever.  HENT:  Positive for hearing loss. Negative for congestion and voice change.   Eyes:  Negative for visual disturbance.  Respiratory:  Negative for cough.   Cardiovascular:  Negative for leg swelling.  Gastrointestinal:  Negative for abdominal pain and constipation.  Genitourinary:  Negative for dysuria, frequency and urgency.  Musculoskeletal:  Positive for arthralgias and gait problem. Negative for myalgias.       Rails or furniture walking sometimes.   Skin:  Negative for color change.  Neurological:  Negative for speech difficulty, weakness and headaches.       Dementia  Psychiatric/Behavioral:  Positive for agitation, behavioral problems and confusion. Negative for sleep disturbance. The patient is nervous/anxious.        Less emotional, behavioral outbursts     Immunization History  Administered Date(s) Administered  . Influenza-Unspecified 02/26/2019, 03/07/2020, 03/13/2021  . Moderna Sars-Covid-2 Vaccination 06/25/2019, 07/23/2019, 04/03/2020, 10/23/2020, 10/11/2021  . PFIZER(Purple Top)SARS-COV-2 Vaccination 02/12/2021  . Pneumococcal Conjugate-13 12/31/2018  . Pneumococcal Polysaccharide-23 02/24/2004  . Pneumococcal-Unspecified 05/22/2016  . Tdap 10/03/2017, 01/13/2021  . Zoster Recombinat (Shingrix) 04/29/2021  . Zoster, Live 05/26/2009  . Zoster, Unspecified 05/26/2009   Pertinent  Health Maintenance Due  Topic Date Due  . DEXA SCAN  Never done  . INFLUENZA VACCINE  12/24/2021      05/13/2020    8:00 PM 05/14/2020    8:07 AM 05/22/2020    7:52 PM 01/13/2021    6:42 PM 02/19/2022   12:53 AM  Fall Risk  Patient Fall Risk Level High fall risk High fall risk High fall risk Moderate fall risk High fall risk   Functional Status Survey:     There were no vitals filed for this visit. There is no height or weight on file to calculate BMI. Physical Exam Vitals and nursing note reviewed.  Constitutional:      Appearance: Normal appearance.  HENT:     Head: Normocephalic and atraumatic.     Mouth/Throat:     Mouth: Mucous membranes are moist.  Eyes:     Extraocular Movements: Extraocular movements intact.     Conjunctiva/sclera: Conjunctivae normal.     Pupils: Pupils are equal, round, and reactive to light.  Cardiovascular:     Rate and Rhythm: Normal rate and regular rhythm.     Heart sounds: No murmur heard. Pulmonary:     Effort: Pulmonary effort is normal.     Breath sounds: No rales.  Abdominal:     General: Bowel sounds are  normal.     Palpations: Abdomen is soft.     Tenderness: There is no abdominal tenderness.  Musculoskeletal:     Right lower leg: No edema.     Left lower leg: No edema.  Skin:    General: Skin is warm and dry.  Neurological:     General: No focal deficit present.     Mental Status: She is alert. Mental status is at baseline.     Gait: Gait abnormal.     Comments: Oriented to person  Psychiatric:     Comments: Confused, pleasant, conversing.     Labs reviewed: Recent Labs    10/22/21 0000 12/13/21 0000 02/19/22 0235  NA 134* 135* 137  K 4.3 4.3  4.3 3.9  CL 96* 98*  98* 106  CO2 31* 25*  25* 24  GLUCOSE  --   --  117*  BUN 13 14 14   CREATININE 1.2* 1.3*  1.3* 0.86  CALCIUM 8.8 9.2  9.2 7.7*   Recent Labs    10/22/21 0000 12/13/21 0000 02/19/22 0235  AST 11* 12* 18  ALT 7 6* 9  ALKPHOS 80 80 62  BILITOT  --   --  0.7  PROT  --   --  6.2*  ALBUMIN 3.7 4.0 2.8*   Recent Labs    06/12/21 0000 09/07/21 0000 12/13/21 0000 02/19/22 0116  WBC 5.3 5.3 5.7 10.4  NEUTROABS 3,217.00 2,756.00 4,047.00  --   HGB 12.1 12.0 13.0 13.5  HCT 38 37 39 40.0  MCV  --   --   --  87.5  PLT 184 201 204 214   Lab Results  Component Value Date   TSH 2.70 05/15/2021    No results found for: "HGBA1C" No results found for: "CHOL", "HDL", "LDLCALC", "LDLDIRECT", "TRIG", "CHOLHDL"  Significant Diagnostic Results in last 30 days:  CT ABDOMEN PELVIS W CONTRAST  Result Date: 02/19/2022 CLINICAL DATA:  88 year old female with history of acute onset of nonlocalized abdominal pain with nausea and vomiting for 1 day. EXAM: CT ABDOMEN AND PELVIS WITH CONTRAST TECHNIQUE: Multidetector CT imaging of the abdomen and pelvis was performed using the standard protocol following bolus administration of intravenous contrast. RADIATION DOSE REDUCTION: This exam was performed according to the departmental dose-optimization program which includes automated exposure control, adjustment of the mA and/or kV according to patient size and/or use of iterative reconstruction technique. CONTRAST:  134mL OMNIPAQUE IOHEXOL 300 MG/ML  SOLN COMPARISON:  CT of the abdomen and pelvis 05/12/2020. FINDINGS: Lower chest: Atherosclerotic calcifications in the descending thoracic aorta as well as the right coronary artery. Hepatobiliary: Well-defined 8.2 x 6.0 x 3.6 cm low-attenuation collection beneath the right hemidiaphragm adjacent to the superior aspect of the right lobe of the liver predominantly next to segment 8 (axial image 12 of series 2 and coronal image 69 of series 4), larger than remote prior study 05/12/2020. Previously noted indwelling catheter has been removed since that time. Remainder of the liver is otherwise unremarkable in appearance, with no new suspicious appearing solid hepatic lesions. No intra or extrahepatic biliary ductal dilatation. Status post cholecystectomy. Pancreas: No pancreatic mass. No pancreatic ductal dilatation. No pancreatic or peripancreatic fluid collections or inflammatory changes. Spleen: Unremarkable. Adrenals/Urinary Tract: Bilateral kidneys and adrenal glands are normal in appearance. No hydroureteronephrosis. Urinary bladder is normal in appearance. Stomach/Bowel:  The appearance of the stomach is normal. There is no pathologic dilatation of small bowel or colon. Numerous colonic diverticuli are noted, without focal surrounding  inflammatory changes to indicate an acute diverticulitis at this time. Postoperative changes in the sigmoid colon of prior partial colectomy with what appears to be an end-to-side anastomosis. The appendix is not confidently identified and may be surgically absent. Regardless, there are no inflammatory changes noted adjacent to the cecum to suggest the presence of an acute appendicitis at this time. Vascular/Lymphatic: Atherosclerotic calcifications throughout the abdominal aorta and pelvic vasculature, without evidence of aneurysm or dissection. No lymphadenopathy noted in the abdomen or pelvis. Reproductive: Uterus and ovaries are atrophic. Coarse calcifications in the uterine body, likely reflective of a small fibroid. Other: No significant volume of ascites.  No pneumoperitoneum. Musculoskeletal: There are no aggressive appearing lytic or blastic lesions noted in the visualized portions of the skeleton. IMPRESSION: 1. Subcapsular fluid collection adjacent to the superior aspect of the right lobe of the liver. This is unusual in appearance, but favored to be chronic given the presence of an indwelling catheter in this region on remote prior CT scan 05/12/2020. Although this may simply represent a chronic sterile fluid collection such as a seroma, the possibility of recurrent abscess is not excluded. Further clinical evaluation is recommended. 2. No other acute findings are noted elsewhere in the abdomen or pelvis to account for the patient's symptoms. 3. Extensive colonic diverticulosis without evidence of acute diverticulitis at this time. 4. Aortic atherosclerosis, in addition to at least right coronary artery disease. 5. Additional incidental findings, as above. Electronically Signed   By: Vinnie Langton M.D.   On: 02/19/2022 05:21     Assessment/Plan  No problem-specific Assessment & Plan notes found for this encounter.   Family/ staff Communication: plan of care reviewed with the patient and charge nurse.   Labs/tests ordered:  none  Time spend 35 minutes.

## 2022-03-05 ENCOUNTER — Non-Acute Institutional Stay (SKILLED_NURSING_FACILITY): Payer: Medicare Other | Admitting: Adult Health

## 2022-03-05 ENCOUNTER — Encounter: Payer: Self-pay | Admitting: Adult Health

## 2022-03-05 DIAGNOSIS — F01518 Vascular dementia, unspecified severity, with other behavioral disturbance: Secondary | ICD-10-CM | POA: Diagnosis not present

## 2022-03-05 DIAGNOSIS — R531 Weakness: Secondary | ICD-10-CM | POA: Diagnosis not present

## 2022-03-05 DIAGNOSIS — F418 Other specified anxiety disorders: Secondary | ICD-10-CM

## 2022-03-05 NOTE — Progress Notes (Signed)
Location:  [facility] Room Number: 103/A Place of Service:  SNF (31) Provider:  Durenda Age NP    Patient Care Team: Mast, Man X, NP as PCP - General (Internal Medicine)  Extended Emergency Contact Information Primary Emergency Contact: Krista Blue Address: [address]          [place], [place] [address] [name] of [place] Phone: [phone] Mobile Phone: [phone] Relation: Relative Secondary Emergency Contact: West,Doug Home Phone: [phone] Mobile Phone: [phone] Relation: Relative  Code Status: DNR  Goals of care: Advanced Directive information    03/05/2022   10:39 AM  Advanced Directives  Type of Advance Directive Healthcare Power of [place];Living will;Out of facility DNR (pink MOST or yellow form)  Copy of [place] in Chart? Yes - validated most recent copy scanned in chart (See row information)     Chief Complaint  Patient presents with   Acute Visit    Confusion, generalized weakness, and incontinence.    HPI:  Pt is a 88 y.o. female seen today for an acute visit for generalized weakness. Staff reported she is needing more help in her ADL care and more confused. No reported fever nor chills.   Latest PHQ-9 score 4, minimal depression. She takes Escitalopram, Quetiapine for depression  Latest BIMS score 4/15, ranging in severe cognitive impairment. She takes Donepezil and Memantine for dementia.  She was seen in the dayroom sitting in a recliner together with other residents. She denies having pain.    Past Medical History:  Diagnosis Date   Arthritis    Dementia ([place])    Hx of concussion    [place]   Hyperlipidemia    Vitamin D deficiency, unspecified    Past Surgical History:  Procedure Laterality Date   CHOLECYSTECTOMY      Allergies  Allergen Reactions   Fish-Derived Products    Pravachol  [Pravastatin Sodium]     Allergies as of 03/05/2022       Reactions   Fish-derived Products    Pravachol [pravastatin Sodium]         Medication List        Accurate as of March 05, 2022 10:45 AM. If you have any questions, ask your nurse or doctor.          clonazePAM 0.5 MG tablet Commonly known as: KLONOPIN Take 1 tablet (0.5 mg total) by mouth at bedtime.   divalproex 125 MG DR tablet Commonly known as: DEPAKOTE Take 125 mg by mouth 3 (three) times daily. * DO NOT CRUSH OR CHEW* *HAZARDOUS DRUG: WEAR GLOVES*   donepezil 5 MG tablet Commonly known as: ARICEPT Take 5 mg by mouth 2 (two) times daily.   escitalopram 10 MG tablet Commonly known as: LEXAPRO Take 10 mg by mouth daily.   hydrOXYzine 25 MG capsule Commonly known as: VISTARIL Take 25 mg by mouth at bedtime.   LORazepam 0.5 MG tablet Commonly known as: ATIVAN Take 0.5 mg by mouth every 8 (eight) hours.   memantine 10 MG tablet Commonly known as: NAMENDA Take 10 mg by mouth 2 (two) times daily.   omeprazole 20 MG capsule Commonly known as: PRILOSEC Take 20 mg by mouth daily.   ondansetron 4 MG tablet Commonly known as: Zofran Take 1 tablet (4 mg total) by mouth every 8 (eight) hours as needed for nausea or vomiting.   POLYVINYL ALCOHOL-POVIDONE OP Place 2 drops into both eyes 3 (  three) times daily. drops; 0.5-0.6 %; amt: 2 drops each eye; ophthalmic (eye)   promethazine 25 MG/ML injection Commonly known as: PHENERGAN Inject 25 mg into the vein every 6 (six) hours as needed for nausea or vomiting.   QUEtiapine 25 MG tablet Commonly known as: SEROQUEL Take 25 mg by mouth 2 (two) times daily.        Review of Systems  Unable to obtain due to dementia.  Immunization History  Administered Date(s) Administered   Influenza-Unspecified 02/26/2019, 03/07/2020, 03/13/2021   Moderna Sars-Covid-2 Vaccination 06/25/2019, 07/23/2019, 04/03/2020, 10/23/2020, 10/11/2021   PFIZER(Purple  Top)SARS-COV-2 Vaccination 02/12/2021   Pneumococcal Conjugate-13 12/31/2018   Pneumococcal Polysaccharide-23 02/24/2004   Pneumococcal-Unspecified 05/22/2016   Tdap 10/03/2017, 01/13/2021   Zoster Recombinat (Shingrix) 04/29/2021   Zoster, Live 05/26/2009   Zoster, Unspecified 05/26/2009   Pertinent  Health Maintenance Due  Topic Date Due   DEXA SCAN  Never done   INFLUENZA VACCINE  12/24/2021      05/13/2020    8:00 PM 05/14/2020    8:07 AM 05/22/2020    7:52 PM 01/13/2021    6:42 PM 02/19/2022   12:53 AM  Fall Risk  Patient Fall Risk Level High fall risk High fall risk High fall risk Moderate fall risk High fall risk   Functional Status Survey:    Vitals:   03/05/22 1027  BP: 128/84  Pulse: 81  Resp: 16  Temp: 98.4 F (36.9 C)  SpO2: 95%  Weight: 174 lb (78.9 kg)  Height: 5\' 6"  (1.676 m)   Body mass index is 28.08 kg/m. Physical Exam Constitutional:      General: She is not in acute distress.    Appearance: Normal appearance.  HENT:     Head: Normocephalic and atraumatic.     Nose: Nose normal.     Mouth/Throat:     Mouth: Mucous membranes are moist.  Eyes:     Conjunctiva/sclera: Conjunctivae normal.  Cardiovascular:     Rate and Rhythm: Normal rate and regular rhythm.  Pulmonary:     Effort: Pulmonary effort is normal.     Breath sounds: Normal breath sounds.  Abdominal:     General: Bowel sounds are normal.     Palpations: Abdomen is soft.  Musculoskeletal:        General: Normal range of motion.     Cervical back: Normal range of motion.  Skin:    General: Skin is warm and dry.  Neurological:     Mental Status: Mental status is at baseline. She is disoriented.  Psychiatric:        Mood and Affect: Mood normal.        Behavior: Behavior normal.     Labs reviewed: Recent Labs    10/22/21 0000 12/13/21 0000 02/19/22 0235  NA 134* 135* 137  K 4.3 4.3  4.3 3.9  CL 96* 98*  98* 106  CO2 31* 25*  25* 24  GLUCOSE  --   --  117*  BUN  13 14 14   CREATININE 1.2* 1.3*  1.3* 0.86  CALCIUM 8.8 9.2  9.2 7.7*   Recent Labs    10/22/21 0000 12/13/21 0000 02/19/22 0235  AST 11* 12* 18  ALT 7 6* 9  ALKPHOS 80 80 62  BILITOT  --   --  0.7  PROT  --   --  6.2*  ALBUMIN 3.7 4.0 2.8*   Recent Labs    06/12/21 0000 09/07/21 0000 12/13/21 0000 02/19/22 0116  WBC 5.3 5.3 5.7 10.4  NEUTROABS 3,217.00 2,756.00 4,047.00  --   HGB 12.1 12.0 13.0 13.5  HCT 38 37 39 40.0  MCV  --   --   --  87.5  PLT 184 201 204 214   Lab Results  Component Value Date   TSH 2.70 05/15/2021   No results found for: "HGBA1C" No results found for: "CHOL", "HDL", "LDLCALC", "LDLDIRECT", "TRIG", "CHOLHDL"  Significant Diagnostic Results in last 30 days:  CT ABDOMEN PELVIS W CONTRAST  Result Date: 02/19/2022 CLINICAL DATA:  88 year old female with history of acute onset of nonlocalized abdominal pain with nausea and vomiting for 1 day. EXAM: CT ABDOMEN AND PELVIS WITH CONTRAST TECHNIQUE: Multidetector CT imaging of the abdomen and pelvis was performed using the standard protocol following bolus administration of intravenous contrast. RADIATION DOSE REDUCTION: This exam was performed according to the departmental dose-optimization program which includes automated exposure control, adjustment of the mA and/or kV according to patient size and/or use of iterative reconstruction technique. CONTRAST:  161mL OMNIPAQUE IOHEXOL 300 MG/ML  SOLN COMPARISON:  CT of the abdomen and pelvis 05/12/2020. FINDINGS: Lower chest: Atherosclerotic calcifications in the descending thoracic aorta as well as the right coronary artery. Hepatobiliary: Well-defined 8.2 x 6.0 x 3.6 cm low-attenuation collection beneath the right hemidiaphragm adjacent to the superior aspect of the right lobe of the liver predominantly next to segment 8 (axial image 12 of series 2 and coronal image 69 of series 4), larger than remote prior study 05/12/2020. Previously noted indwelling catheter has  been removed since that time. Remainder of the liver is otherwise unremarkable in appearance, with no new suspicious appearing solid hepatic lesions. No intra or extrahepatic biliary ductal dilatation. Status post cholecystectomy. Pancreas: No pancreatic mass. No pancreatic ductal dilatation. No pancreatic or peripancreatic fluid collections or inflammatory changes. Spleen: Unremarkable. Adrenals/Urinary Tract: Bilateral kidneys and adrenal glands are normal in appearance. No hydroureteronephrosis. Urinary bladder is normal in appearance. Stomach/Bowel: The appearance of the stomach is normal. There is no pathologic dilatation of small bowel or colon. Numerous colonic diverticuli are noted, without focal surrounding inflammatory changes to indicate an acute diverticulitis at this time. Postoperative changes in the sigmoid colon of prior partial colectomy with what appears to be an end-to-side anastomosis. The appendix is not confidently identified and may be surgically absent. Regardless, there are no inflammatory changes noted adjacent to the cecum to suggest the presence of an acute appendicitis at this time. Vascular/Lymphatic: Atherosclerotic calcifications throughout the abdominal aorta and pelvic vasculature, without evidence of aneurysm or dissection. No lymphadenopathy noted in the abdomen or pelvis. Reproductive: Uterus and ovaries are atrophic. Coarse calcifications in the uterine body, likely reflective of a small fibroid. Other: No significant volume of ascites.  No pneumoperitoneum. Musculoskeletal: There are no aggressive appearing lytic or blastic lesions noted in the visualized portions of the skeleton. IMPRESSION: 1. Subcapsular fluid collection adjacent to the superior aspect of the right lobe of the liver. This is unusual in appearance, but favored to be chronic given the presence of an indwelling catheter in this region on remote prior CT scan 05/12/2020. Although this may simply represent a  chronic sterile fluid collection such as a seroma, the possibility of recurrent abscess is not excluded. Further clinical evaluation is recommended. 2. No other acute findings are noted elsewhere in the abdomen or pelvis to account for the patient's symptoms. 3. Extensive colonic diverticulosis without evidence of acute diverticulitis at this time. 4. Aortic atherosclerosis, in addition to at  least right coronary artery disease. 5. Additional incidental findings, as above. Electronically Signed   By: Vinnie Langton M.D.   On: 02/19/2022 05:21    Assessment/Plan  1. Generalized weakness -  has been needing more assistance with ADLs -   no fever nor shortness of breath -  check CBC, BMP and UA with CS  2. Depression with anxiety -  has mild depression per PHQ-9 score -  continue Escitalopram Quetiapine, Divalproex, Lorazepam and Clonazepam  3. Vascular dementia with behavior disturbance (HCC) -  has severe cognitive impairment -  continue Donepezil and Memantine -  continue supportive care  .   Family/ staff Communication:   Discussed plan of care with charge nurse.  Labs/tests ordered:    CBC, BMP and UA with CS

## 2022-03-05 NOTE — Progress Notes (Incomplete)
Location:  Skilled Nursing Facility at [facility]  Nursing Home Room Number: 103/A Place of Service:  SNF (31) Provider:  Kenard Gower NP    Patient Care Team: Mast, Man X, NP as PCP - General (Internal Medicine)  Extended Emergency Contact Information Primary Emergency Contact: Narda Amber Address: [address]          [place], [place] [address] [name] of [place] Home Phone: [phone] Mobile Phone: [phone] Relation: Relative Secondary Emergency Contact: West,Doug Home Phone: [phone] Mobile Phone: [phone] Relation: Relative  Code Status: DNR  Goals of care: Advanced Directive information    03/05/2022   10:39 AM  Advanced Directives  Type of Advance Directive Healthcare Power of [place];Living will;Out of facility DNR (pink MOST or yellow form)  Copy of Healthcare Power of Attorney in Chart? Yes - validated most recent copy scanned in chart (See row information)     Chief Complaint  Patient presents with  . Acute Visit    Confusion, generalized weakness, and incontinence.    HPI:  Pt is a 88 y.o. female seen today for an acute visit for generalized weakness. Staff reported she is needing more help in her ADL care. She is   Past Medical History:  Diagnosis Date  . Arthritis   . Dementia (HCC)   . Hx of concussion    [facility]  . Hyperlipidemia   . Vitamin D deficiency, unspecified    Past Surgical History:  Procedure Laterality Date  . CHOLECYSTECTOMY      Allergies  Allergen Reactions  . Fish-Derived Products   . Pravachol [Pravastatin Sodium]     Allergies as of 03/05/2022       Reactions   Fish-derived Products    Pravachol [pravastatin Sodium]         Medication List        Accurate as of March 05, 2022 10:45 AM. If you have any questions, ask your nurse or doctor.          clonazePAM 0.5 MG tablet Commonly known as: KLONOPIN Take 1 tablet (0.5 mg  total) by mouth at bedtime.   divalproex 125 MG DR tablet Commonly known as: DEPAKOTE Take 125 mg by mouth 3 (three) times daily. * DO NOT CRUSH OR CHEW* *HAZARDOUS DRUG: WEAR GLOVES*   donepezil 5 MG tablet Commonly known as: ARICEPT Take 5 mg by mouth 2 (two) times daily.   escitalopram 10 MG tablet Commonly known as: LEXAPRO Take 10 mg by mouth daily.   hydrOXYzine 25 MG capsule Commonly known as: VISTARIL Take 25 mg by mouth at bedtime.   LORazepam 0.5 MG tablet Commonly known as: ATIVAN Take 0.5 mg by mouth every 8 (eight) hours.   memantine 10 MG tablet Commonly known as: NAMENDA Take 10 mg by mouth 2 (two) times daily.   omeprazole 20 MG capsule Commonly known as: PRILOSEC Take 20 mg by mouth daily.   ondansetron 4 MG tablet Commonly known as: Zofran Take 1 tablet (4 mg total) by mouth every 8 (eight) hours as needed for nausea or vomiting.   POLYVINYL ALCOHOL-POVIDONE OP Place 2 drops into both eyes 3 (three) times daily. drops; 0.5-0.6 %; amt: 2 drops each eye; ophthalmic (eye)   promethazine 25 MG/ML injection Commonly known as: PHENERGAN Inject 25 mg into the vein every 6 (six) hours as needed for nausea or vomiting.   QUEtiapine 25 MG tablet Commonly known as: SEROQUEL Take 25 mg by mouth 2 (two) times  daily.        Review of Systems  Immunization History  Administered Date(s) Administered  . Influenza-Unspecified 02/26/2019, 03/07/2020, 03/13/2021  . Moderna Sars-Covid-2 Vaccination 06/25/2019, 07/23/2019, 04/03/2020, 10/23/2020, 10/11/2021  . PFIZER(Purple Top)SARS-COV-2 Vaccination 02/12/2021  . Pneumococcal Conjugate-13 12/31/2018  . Pneumococcal Polysaccharide-23 02/24/2004  . Pneumococcal-Unspecified 05/22/2016  . Tdap 10/03/2017, 01/13/2021  . Zoster Recombinat (Shingrix) 04/29/2021  . Zoster, Live 05/26/2009  . Zoster, Unspecified 05/26/2009   Pertinent  Health Maintenance Due  Topic Date Due  . DEXA SCAN  Never done  . INFLUENZA  VACCINE  12/24/2021      05/13/2020    8:00 PM 05/14/2020    8:07 AM 05/22/2020    7:52 PM 01/13/2021    6:42 PM 02/19/2022   12:53 AM  Fall Risk  Patient Fall Risk Level High fall risk High fall risk High fall risk Moderate fall risk High fall risk   Functional Status Survey:    Vitals:   03/05/22 1027  BP: 128/84  Pulse: 81  Resp: 16  Temp: 98.4 F (36.9 C)  SpO2: 95%  Weight: 174 lb (78.9 kg)  Height: 5\' 6"  (1.676 m)   Body mass index is 28.08 kg/m. Physical Exam  Labs reviewed: Recent Labs    10/22/21 0000 12/13/21 0000 02/19/22 0235  NA 134* 135* 137  K 4.3 4.3  4.3 3.9  CL 96* 98*  98* 106  CO2 31* 25*  25* 24  GLUCOSE  --   --  117*  BUN 13 14 14   CREATININE 1.2* 1.3*  1.3* 0.86  CALCIUM 8.8 9.2  9.2 7.7*   Recent Labs    10/22/21 0000 12/13/21 0000 02/19/22 0235  AST 11* 12* 18  ALT 7 6* 9  ALKPHOS 80 80 62  BILITOT  --   --  0.7  PROT  --   --  6.2*  ALBUMIN 3.7 4.0 2.8*   Recent Labs    06/12/21 0000 09/07/21 0000 12/13/21 0000 02/19/22 0116  WBC 5.3 5.3 5.7 10.4  NEUTROABS 3,217.00 2,756.00 4,047.00  --   HGB 12.1 12.0 13.0 13.5  HCT 38 37 39 40.0  MCV  --   --   --  87.5  PLT 184 201 204 214   Lab Results  Component Value Date   TSH 2.70 05/15/2021   No results found for: "HGBA1C" No results found for: "CHOL", "HDL", "LDLCALC", "LDLDIRECT", "TRIG", "CHOLHDL"  Significant Diagnostic Results in last 30 days:  CT ABDOMEN PELVIS W CONTRAST  Result Date: 02/19/2022 CLINICAL DATA:  88 year old female with history of acute onset of nonlocalized abdominal pain with nausea and vomiting for 1 day. EXAM: CT ABDOMEN AND PELVIS WITH CONTRAST TECHNIQUE: Multidetector CT imaging of the abdomen and pelvis was performed using the standard protocol following bolus administration of intravenous contrast. RADIATION DOSE REDUCTION: This exam was performed according to the departmental dose-optimization program which includes automated exposure  control, adjustment of the mA and/or kV according to patient size and/or use of iterative reconstruction technique. CONTRAST:  02/21/2022 OMNIPAQUE IOHEXOL 300 MG/ML  SOLN COMPARISON:  CT of the abdomen and pelvis 05/12/2020. FINDINGS: Lower chest: Atherosclerotic calcifications in the descending thoracic aorta as well as the right coronary artery. Hepatobiliary: Well-defined 8.2 x 6.0 x 3.6 cm low-attenuation collection beneath the right hemidiaphragm adjacent to the superior aspect of the right lobe of the liver predominantly next to segment 8 (axial image 12 of series 2 and coronal image 69 of series 4), larger  than remote prior study 05/12/2020. Previously noted indwelling catheter has been removed since that time. Remainder of the liver is otherwise unremarkable in appearance, with no new suspicious appearing solid hepatic lesions. No intra or extrahepatic biliary ductal dilatation. Status post cholecystectomy. Pancreas: No pancreatic mass. No pancreatic ductal dilatation. No pancreatic or peripancreatic fluid collections or inflammatory changes. Spleen: Unremarkable. Adrenals/Urinary Tract: Bilateral kidneys and adrenal glands are normal in appearance. No hydroureteronephrosis. Urinary bladder is normal in appearance. Stomach/Bowel: The appearance of the stomach is normal. There is no pathologic dilatation of small bowel or colon. Numerous colonic diverticuli are noted, without focal surrounding inflammatory changes to indicate an acute diverticulitis at this time. Postoperative changes in the sigmoid colon of prior partial colectomy with what appears to be an end-to-side anastomosis. The appendix is not confidently identified and may be surgically absent. Regardless, there are no inflammatory changes noted adjacent to the cecum to suggest the presence of an acute appendicitis at this time. Vascular/Lymphatic: Atherosclerotic calcifications throughout the abdominal aorta and pelvic vasculature, without evidence of  aneurysm or dissection. No lymphadenopathy noted in the abdomen or pelvis. Reproductive: Uterus and ovaries are atrophic. Coarse calcifications in the uterine body, likely reflective of a small fibroid. Other: No significant volume of ascites.  No pneumoperitoneum. Musculoskeletal: There are no aggressive appearing lytic or blastic lesions noted in the visualized portions of the skeleton. IMPRESSION: 1. Subcapsular fluid collection adjacent to the superior aspect of the right lobe of the liver. This is unusual in appearance, but favored to be chronic given the presence of an indwelling catheter in this region on remote prior CT scan 05/12/2020. Although this may simply represent a chronic sterile fluid collection such as a seroma, the possibility of recurrent abscess is not excluded. Further clinical evaluation is recommended. 2. No other acute findings are noted elsewhere in the abdomen or pelvis to account for the patient's symptoms. 3. Extensive colonic diverticulosis without evidence of acute diverticulitis at this time. 4. Aortic atherosclerosis, in addition to at least right coronary artery disease. 5. Additional incidental findings, as above. Electronically Signed   By: Vinnie Langton M.D.   On: 02/19/2022 05:21    Assessment/Plan There are no diagnoses linked to this encounter.   Family/ staff Communication:   Labs/tests ordered:

## 2022-03-08 ENCOUNTER — Telehealth: Payer: Self-pay | Admitting: Adult Health

## 2022-03-08 NOTE — Telephone Encounter (Signed)
Nurse called to report UA C and S result showing >100,000 colonies of Proteus. Nurse states that urine was done for general decline and patient has severe dementia. Has no urinary symptoms or fever. Since patient is asymptomatic will hold off on treatment. Nurse to monitor patient. If having symptoms nurse to call back. Possible colonization present. Will defer to primary provider, message sent in epic.

## 2022-04-06 ENCOUNTER — Telehealth: Payer: Self-pay | Admitting: Orthopedic Surgery

## 2022-04-06 NOTE — Telephone Encounter (Signed)
Nursing called to report fall with head laceration. Laceration to head stable/reinforced. She is not on a blood thinner. Neuro checks and vitals have been stable. Advised to continue neuro checks and notify on call provider if any changes.

## 2022-04-07 ENCOUNTER — Non-Acute Institutional Stay (SKILLED_NURSING_FACILITY): Payer: Medicare Other | Admitting: Nurse Practitioner

## 2022-04-07 ENCOUNTER — Encounter: Payer: Self-pay | Admitting: Nurse Practitioner

## 2022-04-07 DIAGNOSIS — F01C18 Vascular dementia, severe, with other behavioral disturbance: Secondary | ICD-10-CM

## 2022-04-07 DIAGNOSIS — N1831 Chronic kidney disease, stage 3a: Secondary | ICD-10-CM

## 2022-04-07 DIAGNOSIS — R2681 Unsteadiness on feet: Secondary | ICD-10-CM

## 2022-04-07 DIAGNOSIS — F418 Other specified anxiety disorders: Secondary | ICD-10-CM | POA: Diagnosis not present

## 2022-04-07 DIAGNOSIS — K219 Gastro-esophageal reflux disease without esophagitis: Secondary | ICD-10-CM

## 2022-04-07 DIAGNOSIS — R7989 Other specified abnormal findings of blood chemistry: Secondary | ICD-10-CM

## 2022-04-07 NOTE — Progress Notes (Signed)
Location:   SNF [facility] Room Number: 103 Place of Service:  SNF (31) Provider: Arna Snipe Dresean Beckel NP  Aniaya Bacha X, NP  Patient Care Team: Danyon Mcginness X, NP as PCP - General (Internal Medicine)  Extended Emergency Contact Information Primary Emergency Contact: Narda Amber Address: [address]          [place], [place] [address] [name] of [place] Home Phone: [phone] Mobile Phone: [phone] Relation: Relative Secondary Emergency Contact: West,Doug Home Phone: [phone] Mobile Phone: [phone] Relation: Relative  Code Status: DNR Goals of care: Advanced Directive information    03/05/2022   10:39 AM  Advanced Directives  Type of Advance Directive Healthcare Power of [place];Living will;Out of facility DNR (pink MOST or yellow form)  Copy of Healthcare Power of Attorney in Chart? Yes - validated most recent copy scanned in chart (See row information)     Chief Complaint  Patient presents with  . Acute Visit    Hit head    HPI:  Pt is a 88 y.o. female seen today for an acute visit for   04/06/22 sitting in w/c adjusted position, hit her head against a shelf when her chair rolled back, resulted 2cm laceration superior occiput. Denied headache, no focal neurological symptoms noted.     Serum Ca 7.7 02/19/22,  off Ca supplement, PTH<10             Dementia/behavioral issues, takes Depakote and Seroquel. Vistaril hs. On Namenda, Donepezil. . 07/22/21 MMSE 21/30             Depression/anxiety, takes Lexpro, Clonazepam, Depakote, Quetiapine, Lorazepam, Hydroxyzine nightly. Na 137 02/19/22 CKD stable. Bun/creat 14/0.86 02/19/22 Elevated TSH 6.72 02/15/21>>2.7 05/14/21.  Gait abnormality, furniture or walker, needs reminder for safety.  GERD, stable, taking Omeprazole. Hgb 13.5 02/19/22 Seroma per surgeon consultation 02/19/22 CT abd/pelvis with fluid collection in the liver at the same site of prior abscess in 2021               Past Medical History:   Diagnosis Date  . Arthritis   . Dementia (HCC)   . Hx of concussion    [facility]  . Hyperlipidemia   . Vitamin D deficiency, unspecified    Past Surgical History:  Procedure Laterality Date  . CHOLECYSTECTOMY      Allergies  Allergen Reactions  . Fish-Derived Products   . Pravachol [Pravastatin Sodium]     Allergies as of 04/07/2022       Reactions   Fish-derived Products    Pravachol [pravastatin Sodium]         Medication List        Accurate as of April 07, 2022  5:00 PM. If you have any questions, ask your nurse or doctor.          clonazePAM 0.5 MG tablet Commonly known as: KLONOPIN Take 1 tablet (0.5 mg total) by mouth at bedtime.   divalproex 125 MG DR tablet Commonly known as: DEPAKOTE Take 125 mg by mouth 3 (three) times daily. * DO NOT CRUSH OR CHEW* *HAZARDOUS DRUG: WEAR GLOVES*   donepezil 5 MG tablet Commonly known as: ARICEPT Take 5 mg by mouth 2 (two) times daily.   escitalopram 10 MG tablet Commonly known as: LEXAPRO Take 10 mg by mouth daily.   hydrOXYzine 25 MG capsule Commonly known as: VISTARIL Take 25 mg by mouth at bedtime.   memantine 10 MG tablet Commonly known as: NAMENDA Take 10 mg by mouth  2 (two) times daily.   omeprazole 20 MG capsule Commonly known as: PRILOSEC Take 20 mg by mouth daily.   ondansetron 4 MG tablet Commonly known as: Zofran Take 1 tablet (4 mg total) by mouth every 8 (eight) hours as needed for nausea or vomiting.   POLYVINYL ALCOHOL-POVIDONE OP Place 2 drops into both eyes 3 (three) times daily. drops; 0.5-0.6 %; amt: 2 drops each eye; ophthalmic (eye)   promethazine 25 MG/ML injection Commonly known as: PHENERGAN Inject 25 mg into the vein every 6 (six) hours as needed for nausea or vomiting.   QUEtiapine 25 MG tablet Commonly known as: SEROQUEL Take 25 mg by mouth 2 (two) times daily.        Review of Systems  Immunization History  Administered Date(s)  Administered  . Influenza-Unspecified 02/26/2019, 03/07/2020, 03/13/2021  . Moderna Sars-Covid-2 Vaccination 06/25/2019, 07/23/2019, 04/03/2020, 10/23/2020, 10/11/2021  . PFIZER(Purple Top)SARS-COV-2 Vaccination 02/12/2021  . Pneumococcal Conjugate-13 12/31/2018  . Pneumococcal Polysaccharide-23 02/24/2004  . Pneumococcal-Unspecified 05/22/2016  . Tdap 10/03/2017, 01/13/2021  . Zoster Recombinat (Shingrix) 04/29/2021  . Zoster, Live 05/26/2009  . Zoster, Unspecified 05/26/2009   Pertinent  Health Maintenance Due  Topic Date Due  . DEXA SCAN  Never done  . INFLUENZA VACCINE  12/24/2021      05/13/2020    8:00 PM 05/14/2020    8:07 AM 05/22/2020    7:52 PM 01/13/2021    6:42 PM 02/19/2022   12:53 AM  Fall Risk  Patient Fall Risk Level High fall risk High fall risk High fall risk Moderate fall risk High fall risk   Functional Status Survey:    Vitals:   04/07/22 1300  BP: 132/70  Pulse: 76  Resp: 18  Temp: (!) 97.5 F (36.4 C)  SpO2: 93%  Weight: 170 lb 6.4 oz (77.3 kg)   Body mass index is 27.5 kg/m. Physical Exam  Labs reviewed: Recent Labs    10/22/21 0000 12/13/21 0000 02/19/22 0235  NA 134* 135* 137  K 4.3 4.3  4.3 3.9  CL 96* 98*  98* 106  CO2 31* 25*  25* 24  GLUCOSE  --   --  117*  BUN 13 14 14   CREATININE 1.2* 1.3*  1.3* 0.86  CALCIUM 8.8 9.2  9.2 7.7*   Recent Labs    10/22/21 0000 12/13/21 0000 02/19/22 0235  AST 11* 12* 18  ALT 7 6* 9  ALKPHOS 80 80 62  BILITOT  --   --  0.7  PROT  --   --  6.2*  ALBUMIN 3.7 4.0 2.8*   Recent Labs    06/12/21 0000 09/07/21 0000 12/13/21 0000 02/19/22 0116  WBC 5.3 5.3 5.7 10.4  NEUTROABS 3,217.00 2,756.00 4,047.00  --   HGB 12.1 12.0 13.0 13.5  HCT 38 37 39 40.0  MCV  --   --   --  87.5  PLT 184 201 204 214   Lab Results  Component Value Date   TSH 2.70 05/15/2021   No results found for: "HGBA1C" No results found for: "CHOL", "HDL", "LDLCALC", "LDLDIRECT", "TRIG",  "CHOLHDL"  Significant Diagnostic Results in last 30 days:  No results found.  Assessment/Plan: No problem-specific Assessment & Plan notes found for this encounter.    Family/ staff Communication: plan of care reviewed with the patient and charge nurse.   Labs/tests ordered:  none  Time spend 35 minutes.

## 2022-04-08 NOTE — Assessment & Plan Note (Signed)
takes Lexpro, Clonazepam, Depakote, Quetiapine, Lorazepam, Hydroxyzine nightly. Na 137 02/19/22

## 2022-04-08 NOTE — Assessment & Plan Note (Signed)
stable, taking Omeprazole. Hgb 13.5 02/19/22

## 2022-04-08 NOTE — Assessment & Plan Note (Signed)
furniture or walker, needs reminder for safety.

## 2022-04-08 NOTE — Assessment & Plan Note (Signed)
Elevated TSH 6.72 02/15/21>>2.7 05/14/21.

## 2022-04-08 NOTE — Assessment & Plan Note (Signed)
Serum Ca 7.7 02/19/22,  off Ca supplement, PTH<10 

## 2022-04-08 NOTE — Assessment & Plan Note (Signed)
Bun/creat 14/0.86 02/19/22

## 2022-04-08 NOTE — Assessment & Plan Note (Signed)
Dementia/behavioral issues, takes Depakote and Seroquel. Vistaril hs. On Namenda, Donepezil. . 07/22/21 MMSE 21/30

## 2022-04-28 ENCOUNTER — Non-Acute Institutional Stay (SKILLED_NURSING_FACILITY): Payer: Medicare Other | Admitting: Nurse Practitioner

## 2022-04-28 DIAGNOSIS — K219 Gastro-esophageal reflux disease without esophagitis: Secondary | ICD-10-CM

## 2022-04-28 DIAGNOSIS — F418 Other specified anxiety disorders: Secondary | ICD-10-CM

## 2022-04-28 DIAGNOSIS — R7989 Other specified abnormal findings of blood chemistry: Secondary | ICD-10-CM

## 2022-04-28 DIAGNOSIS — N1831 Chronic kidney disease, stage 3a: Secondary | ICD-10-CM

## 2022-04-28 DIAGNOSIS — R2681 Unsteadiness on feet: Secondary | ICD-10-CM | POA: Diagnosis not present

## 2022-04-28 DIAGNOSIS — F01C18 Vascular dementia, severe, with other behavioral disturbance: Secondary | ICD-10-CM

## 2022-04-28 NOTE — Progress Notes (Signed)
Location:   SNF [facility] Room Number: 103 Place of Service:  SNF (31) Provider: Arna Snipe Koichi Platte NP  Caison Hearn X, NP  Patient Care Team: Manuel Dall X, NP as PCP - General (Internal Medicine)  Extended Emergency Contact Information Primary Emergency Contact: Narda Amber Address: [address]          [place], [place] [address] [name] of [place] Home Phone: [phone] Mobile Phone: [phone] Relation: Relative Secondary Emergency Contact: West,Doug Home Phone: [phone] Mobile Phone: [phone] Relation: Relative  Code Status:  DNR Goals of care: Advanced Directive information    03/05/2022   10:39 AM  Advanced Directives  Type of Advance Directive Healthcare Power of [place];Living will;Out of facility DNR (pink MOST or yellow form)  Copy of Healthcare Power of Attorney in Chart? Yes - validated most recent copy scanned in chart (See row information)     Chief Complaint  Patient presents with   Medical Management of Chronic Issues    HPI:  Pt is a 88 y.o. female seen today for medical management of chronic diseases.    Serum Ca 7.7 02/19/22,  off Ca supplement, PTH<10             Dementia/behavioral issues, takes Depakote and Seroquel. Vistaril hs. On Namenda, Donepezil. . 07/22/21 MMSE 21/30             Depression/anxiety, takes Lexpro, Clonazepam, Depakote, Quetiapine, Lorazepam, Hydroxyzine nightly. Na 137 03/07/22 CKD stable. Bun/creat 13/1.23 03/07/22 Elevated TSH 6.72 02/15/21>>2.7 05/14/21.  Gait abnormality, furniture or walker, needs reminder for safety.  GERD, stable, taking Omeprazole. Hgb 13.6 03/17/22 Seroma per surgeon consultation 02/19/22 CT abd/pelvis with fluid collection in the liver at the same site of prior abscess in 2021                Past Medical History:  Diagnosis Date   Arthritis    Dementia (HCC)    Hx of concussion    [facility]   Hyperlipidemia    Vitamin D deficiency, unspecified     Past Surgical History:  Procedure Laterality Date   CHOLECYSTECTOMY      Allergies  Allergen Reactions   Fish-Derived Products    Pravachol [Pravastatin Sodium]     Allergies as of 04/28/2022       Reactions   Fish-derived Products    Pravachol [pravastatin Sodium]         Medication List        Accurate as of April 28, 2022 11:59 PM. If you have any questions, ask your nurse or doctor.          clonazePAM 0.5 MG tablet Commonly known as: KLONOPIN Take 1 tablet (0.5 mg total) by mouth at bedtime.   divalproex 125 MG DR tablet Commonly known as: DEPAKOTE Take 125 mg by mouth 3 (three) times daily. * DO NOT CRUSH OR CHEW* *HAZARDOUS DRUG: WEAR GLOVES*   donepezil 5 MG tablet Commonly known as: ARICEPT Take 5 mg by mouth 2 (two) times daily.   escitalopram 10 MG tablet Commonly known as: LEXAPRO Take 10 mg by mouth daily.   hydrOXYzine 25 MG capsule Commonly known as: VISTARIL Take 25 mg by mouth at bedtime.   memantine 10 MG tablet Commonly known as: NAMENDA Take 10 mg by mouth 2 (two) times daily.   omeprazole 20 MG capsule Commonly known as: PRILOSEC Take 20 mg by mouth daily.   ondansetron 4 MG tablet Commonly known as: Zofran Take 1  tablet (4 mg total) by mouth every 8 (eight) hours as needed for nausea or vomiting.   POLYVINYL ALCOHOL-POVIDONE OP Place 2 drops into both eyes 3 (three) times daily. drops; 0.5-0.6 %; amt: 2 drops each eye; ophthalmic (eye)   promethazine 25 MG/ML injection Commonly known as: PHENERGAN Inject 25 mg into the vein every 6 (six) hours as needed for nausea or vomiting.   QUEtiapine 25 MG tablet Commonly known as: SEROQUEL Take 25 mg by mouth 2 (two) times daily.        Review of Systems  Constitutional:  Negative for appetite change, fatigue and fever.  HENT:  Positive for hearing loss. Negative for congestion and voice change.   Eyes:  Negative for visual disturbance.  Respiratory:  Negative for  cough.   Cardiovascular:  Negative for leg swelling.  Gastrointestinal:  Negative for abdominal pain and constipation.  Genitourinary:  Negative for dysuria, frequency and urgency.  Musculoskeletal:  Positive for arthralgias and gait problem. Negative for myalgias.       Rails or furniture walking sometimes.   Skin:  Negative for color change.  Neurological:  Negative for speech difficulty, weakness and headaches.       Dementia  Psychiatric/Behavioral:  Positive for agitation, behavioral problems and confusion. Negative for sleep disturbance. The patient is nervous/anxious.        Less emotional, behavioral outbursts     Immunization History  Administered Date(s) Administered   Influenza-Unspecified 02/26/2019, 03/07/2020, 03/13/2021   Moderna Sars-Covid-2 Vaccination 06/25/2019, 07/23/2019, 04/03/2020, 10/23/2020, 10/11/2021   PFIZER(Purple Top)SARS-COV-2 Vaccination 02/12/2021   Pneumococcal Conjugate-13 12/31/2018   Pneumococcal Polysaccharide-23 02/24/2004   Pneumococcal-Unspecified 05/22/2016   Tdap 10/03/2017, 01/13/2021   Zoster Recombinat (Shingrix) 04/29/2021   Zoster, Live 05/26/2009   Zoster, Unspecified 05/26/2009   Pertinent  Health Maintenance Due  Topic Date Due   DEXA SCAN  Never done   INFLUENZA VACCINE  12/24/2021      05/13/2020    8:00 PM 05/14/2020    8:07 AM 05/22/2020    7:52 PM 01/13/2021    6:42 PM 02/19/2022   12:53 AM  Fall Risk  Patient Fall Risk Level High fall risk High fall risk High fall risk Moderate fall risk High fall risk   Functional Status Survey:    Vitals:   04/28/22 1420  BP: 106/74  Pulse: 64  Resp: 20  Temp: 98.1 F (36.7 C)  SpO2: 95%  Weight: 170 lb 3.2 oz (77.2 kg)   Body mass index is 27.47 kg/m. Physical Exam Vitals and nursing note reviewed.  Constitutional:      Appearance: Normal appearance.  HENT:     Head: Normocephalic and atraumatic.     Mouth/Throat:     Mouth: Mucous membranes are moist.  Eyes:      Extraocular Movements: Extraocular movements intact.     Conjunctiva/sclera: Conjunctivae normal.     Pupils: Pupils are equal, round, and reactive to light.  Cardiovascular:     Rate and Rhythm: Normal rate and regular rhythm.     Heart sounds: No murmur heard. Pulmonary:     Effort: Pulmonary effort is normal.     Breath sounds: No rales.  Abdominal:     General: Bowel sounds are normal.     Palpations: Abdomen is soft.     Tenderness: There is no abdominal tenderness.  Musculoskeletal:     Right lower leg: No edema.     Left lower leg: No edema.  Skin:  General: Skin is warm and dry.  Neurological:     General: No focal deficit present.     Mental Status: She is alert. Mental status is at baseline.     Gait: Gait abnormal.     Comments: Oriented to person  Psychiatric:     Comments: Confused, pleasant, conversing.      Labs reviewed: Recent Labs    10/22/21 0000 12/13/21 0000 02/19/22 0235  NA 134* 135* 137  K 4.3 4.3  4.3 3.9  CL 96* 98*  98* 106  CO2 31* 25*  25* 24  GLUCOSE  --   --  117*  BUN 13 14 14   CREATININE 1.2* 1.3*  1.3* 0.86  CALCIUM 8.8 9.2  9.2 7.7*   Recent Labs    10/22/21 0000 12/13/21 0000 02/19/22 0235  AST 11* 12* 18  ALT 7 6* 9  ALKPHOS 80 80 62  BILITOT  --   --  0.7  PROT  --   --  6.2*  ALBUMIN 3.7 4.0 2.8*   Recent Labs    06/12/21 0000 09/07/21 0000 12/13/21 0000 02/19/22 0116  WBC 5.3 5.3 5.7 10.4  NEUTROABS 3,217.00 2,756.00 4,047.00  --   HGB 12.1 12.0 13.0 13.5  HCT 38 37 39 40.0  MCV  --   --   --  87.5  PLT 184 201 204 214   Lab Results  Component Value Date   TSH 2.70 05/15/2021   No results found for: "HGBA1C" No results found for: "CHOL", "HDL", "LDLCALC", "LDLDIRECT", "TRIG", "CHOLHDL"  Significant Diagnostic Results in last 30 days:  No results found.  Assessment/Plan  GERD (gastroesophageal reflux disease)  stable, taking Omeprazole.   Unsteady gait furniture or walker, needs reminder  for safety.   Elevated TSH Elevated TSH 6.72 02/15/21>>2.7 05/14/21.   CKD (chronic kidney disease) stage 3, GFR 30-59 ml/min (HCC) Bun/creat 13/1.23 03/07/22  Depression with anxiety Her mood is managed, takes Lexpro, Clonazepam, Depakote, Quetiapine, Lorazepam, Hydroxyzine nightly. Na 137 03/07/22  Vascular dementia ([place])  takes Depakote and Seroquel. Vistaril hs. On Namenda, Donepezil. . 07/22/21 MMSE 21/30   Family/ staff Communication: plan of care reviewed with the patient and charge nurse.   Labs/tests ordered:  none  Time spend 35 minutes.

## 2022-04-29 ENCOUNTER — Encounter: Payer: Self-pay | Admitting: Nurse Practitioner

## 2022-04-29 NOTE — Assessment & Plan Note (Signed)
Elevated TSH 6.72 02/15/21>>2.7 05/14/21.  

## 2022-04-29 NOTE — Assessment & Plan Note (Addendum)
stable, taking Omeprazole.  

## 2022-04-29 NOTE — Assessment & Plan Note (Signed)
takes Depakote and Seroquel. Vistaril hs. On Namenda, Donepezil. . 07/22/21 MMSE 21/30

## 2022-04-29 NOTE — Assessment & Plan Note (Addendum)
Her mood is managed, takes Lexpro, Clonazepam, Depakote, Quetiapine, Lorazepam, Hydroxyzine nightly. Na 137 03/07/22

## 2022-04-29 NOTE — Assessment & Plan Note (Signed)
furniture or walker, needs reminder for safety.  

## 2022-04-29 NOTE — Assessment & Plan Note (Addendum)
Bun/creat 13/1.23 03/07/22 

## 2022-05-06 ENCOUNTER — Non-Acute Institutional Stay (SKILLED_NURSING_FACILITY): Payer: Medicare Other | Admitting: Family Medicine

## 2022-05-06 DIAGNOSIS — K219 Gastro-esophageal reflux disease without esophagitis: Secondary | ICD-10-CM

## 2022-05-06 DIAGNOSIS — R451 Restlessness and agitation: Secondary | ICD-10-CM

## 2022-05-06 DIAGNOSIS — R2681 Unsteadiness on feet: Secondary | ICD-10-CM

## 2022-05-06 DIAGNOSIS — F01C18 Vascular dementia, severe, with other behavioral disturbance: Secondary | ICD-10-CM

## 2022-05-06 DIAGNOSIS — F418 Other specified anxiety disorders: Secondary | ICD-10-CM

## 2022-05-06 NOTE — Progress Notes (Signed)
Provider:  Jacalyn LefevreStephen Kveon Casanas, MD Location:      Place of Service:     PCP: Mast, Man X, NP Patient Care Team: Mast, Man X, NP as PCP - General (Internal Medicine)  Extended Emergency Contact Information Primary Emergency Contact: Narda AmberJeffreys,(POA) Margaret Address: [address]          [place], [place] [address] [place] Home Phone: [phone] Mobile Phone: [phone] Relation: Relative Secondary Emergency Contact: West,Doug Home Phone: [phone] Mobile Phone: [phone] Relation: Relative  Code Status:  Goals of Care: Advanced Directive information    03/05/2022   10:39 AM  Advanced Directives  Type of Advance Directive Healthcare Power of [place];Living will;Out of facility DNR (pink MOST or yellow form)  Copy of Healthcare Power of Attorney in Chart? Yes - validated most recent copy scanned in chart (See row information)      No chief complaint on file.   HPI: Patient is a 88 y.o. female seen today for medical management of chronic problems including dementia, gait instability and falls, hypertension, and GERD. I found her in her wheelchair in the hall in memory care.  She is alert and able to respond to questions.  She did ask me twice in the course of 5 minutes where I was from.  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  She does not recall any local family. Her medications include Lexapro clonazepam Depakote Seroquel lorazepam and hydroxyzine.  I believe we should try to reduce some of these medicines Also history of hypothyroidism and GERD Nursing does not report any new problems today  Past Medical History:  Diagnosis Date   Arthritis    Dementia (HCC)    Hx of concussion    [facility]   Hyperlipidemia    Vitamin D deficiency, unspecified    Past Surgical History:  Procedure Laterality Date   CHOLECYSTECTOMY      reports that she has never smoked.  She has never used smokeless tobacco. She reports that she does not drink alcohol and does not use drugs. Social History   Socioeconomic History   Marital status: Single    Spouse name: Not on file   Number of children: Not on file   Years of education: Not on file   Highest education level: Not on file  Occupational History   Not on file  Tobacco Use   Smoking status: Never   Smokeless tobacco: Never  Vaping Use   Vaping Use: Never used  Substance and Sexual Activity   Alcohol use: Never   Drug use: Never   Sexual activity: Never  Other Topics Concern   Not on file  Social History Narrative   Not on file   Social Determinants of Health   Financial Resource Strain: Not on file  Food Insecurity: Not on file  Transportation Needs: Not on file  Physical Activity: Not on file  Stress: Not on file  Social Connections: Not on file  Intimate Partner Violence: Not on file    Functional Status Survey:    Family History  Family history unknown: Yes    Health Maintenance  Topic Date Due   DEXA SCAN  Never done   Zoster Vaccines- Shingrix (2 of 2) 06/24/2021   INFLUENZA VACCINE  12/24/2021   COVID-19 Vaccine (7 - 2023-24 season) 01/24/2022   Medicare Annual Wellness (AWV)  07/12/2022   DTaP/Tdap/Td (3 - Td or  Tdap) 01/14/2031   Pneumonia Vaccine 64+ Years old  Completed   HPV VACCINES  Aged Out    Allergies  Allergen Reactions   Fish-Derived Products    Pravachol [Pravastatin Sodium]     Outpatient Encounter Medications as of 05/06/2022  Medication Sig   clonazePAM (KLONOPIN) 0.5 MG tablet Take 1 tablet (0.5 mg total) by mouth at bedtime.   divalproex (DEPAKOTE) 125 MG DR tablet Take 125 mg by mouth 3 (three) times daily. * DO NOT CRUSH OR CHEW* *HAZARDOUS DRUG: WEAR GLOVES*   donepezil (ARICEPT) 5 MG tablet Take 5 mg by mouth 2 (two) times daily.   escitalopram (LEXAPRO) 10 MG tablet Take 10 mg by mouth daily.   hydrOXYzine (VISTARIL) 25 MG capsule Take 25 mg  by mouth at bedtime.   memantine (NAMENDA) 10 MG tablet Take 10 mg by mouth 2 (two) times daily.   omeprazole (PRILOSEC) 20 MG capsule Take 20 mg by mouth daily.   ondansetron (ZOFRAN) 4 MG tablet Take 1 tablet (4 mg total) by mouth every 8 (eight) hours as needed for nausea or vomiting.   POLYVINYL ALCOHOL-POVIDONE OP Place 2 drops into both eyes 3 (three) times daily. drops; 0.5-0.6 %; amt: 2 drops each eye; ophthalmic (eye)   promethazine (PHENERGAN) 25 MG/ML injection Inject 25 mg into the vein every 6 (six) hours as needed for nausea or vomiting.   QUEtiapine (SEROQUEL) 25 MG tablet Take 25 mg by mouth 2 (two) times daily.   No facility-administered encounter medications on file as of 05/06/2022.    Review of Systems  Constitutional: Negative.   HENT: Negative.    Respiratory: Negative.    Musculoskeletal:  Positive for gait problem.  Psychiatric/Behavioral:  Positive for confusion.     There were no vitals filed for this visit. There is no height or weight on file to calculate BMI. Physical Exam Vitals and nursing note reviewed.  Eyes:     Extraocular Movements: Extraocular movements intact.     Pupils: Pupils are equal, round, and reactive to light.  Cardiovascular:     Rate and Rhythm: Normal rate and regular rhythm.  Pulmonary:     Effort: Pulmonary effort is normal.     Breath sounds: Normal breath sounds.  Abdominal:     General: Bowel sounds are normal.     Palpations: Abdomen is soft.  Skin:    General: Skin is warm and dry.  Neurological:     General: No focal deficit present.     Comments: Oriented to person but not place or time  Psychiatric:        Mood and Affect: Mood normal.        Behavior: Behavior normal.     Labs reviewed: Basic Metabolic Panel: Recent Labs    10/22/21 0000 12/13/21 0000 02/19/22 0235  NA 134* 135* 137  K 4.3 4.3  4.3 3.9  CL 96* 98*  98* 106  CO2 31* 25*  25* 24  GLUCOSE  --   --  117*  BUN 13 14 14   CREATININE  1.2* 1.3*  1.3* 0.86  CALCIUM 8.8 9.2  9.2 7.7*   Liver Function Tests: Recent Labs    10/22/21 0000 12/13/21 0000 02/19/22 0235  AST 11* 12* 18  ALT 7 6* 9  ALKPHOS 80 80 62  BILITOT  --   --  0.7  PROT  --   --  6.2*  ALBUMIN 3.7 4.0 2.8*   Recent Labs  09/07/21 0000 10/02/21 0000 02/19/22 0235  LIPASE 216 23 24   No results for input(s): "AMMONIA" in the last 8760 hours. CBC: Recent Labs    06/12/21 0000 09/07/21 0000 12/13/21 0000 02/19/22 0116  WBC 5.3 5.3 5.7 10.4  NEUTROABS 3,217.00 2,756.00 4,047.00  --   HGB 12.1 12.0 13.0 13.5  HCT 38 37 39 40.0  MCV  --   --   --  87.5  PLT 184 201 204 214   Cardiac Enzymes: No results for input(s): "CKTOTAL", "CKMB", "CKMBINDEX", "TROPONINI" in the last 8760 hours. BNP: Invalid input(s): "POCBNP" No results found for: "HGBA1C" Lab Results  Component Value Date   TSH 2.70 05/15/2021   Lab Results  Component Value Date   VITAMINB12 466 07/12/2019   No results found for: "FOLATE" No results found for: "IRON", "TIBC", "FERRITIN"  Imaging and Procedures obtained prior to SNF admission: CT ABDOMEN PELVIS W CONTRAST  Result Date: 02/19/2022 CLINICAL DATA:  88 year old female with history of acute onset of nonlocalized abdominal pain with nausea and vomiting for 1 day. EXAM: CT ABDOMEN AND PELVIS WITH CONTRAST TECHNIQUE: Multidetector CT imaging of the abdomen and pelvis was performed using the standard protocol following bolus administration of intravenous contrast. RADIATION DOSE REDUCTION: This exam was performed according to the departmental dose-optimization program which includes automated exposure control, adjustment of the mA and/or kV according to patient size and/or use of iterative reconstruction technique. CONTRAST:  OMNIPAQUE IOHEXOL 300 MG/ML  SOLN COMPARISON:  CT of the abdomen and pelvis 05/12/2020. FINDINGS: Lower chest: Atherosclerotic calcifications in the descending thoracic aorta as well as  the right coronary artery. Hepatobiliary: Well-defined 8.2 x 6.0 x 3.6 cm low-attenuation collection beneath the right hemidiaphragm adjacent to the superior aspect of the right lobe of the liver predominantly next to segment 8 (axial image 12 of series 2 and coronal image 69 of series 4), larger than remote prior study 05/12/2020. Previously noted indwelling catheter has been removed since that time. Remainder of the liver is otherwise unremarkable in appearance, with no new suspicious appearing solid hepatic lesions. No intra or extrahepatic biliary ductal dilatation. Status post cholecystectomy. Pancreas: No pancreatic mass. No pancreatic ductal dilatation. No pancreatic or peripancreatic fluid collections or inflammatory changes. Spleen: Unremarkable. Adrenals/Urinary Tract: Bilateral kidneys and adrenal glands are normal in appearance. No hydroureteronephrosis. Urinary bladder is normal in appearance. Stomach/Bowel: The appearance of the stomach is normal. There is no pathologic dilatation of small bowel or colon. Numerous colonic diverticuli are noted, without focal surrounding inflammatory changes to indicate an acute diverticulitis at this time. Postoperative changes in the sigmoid colon of prior partial colectomy with what appears to be an end-to-side anastomosis. The appendix is not confidently identified and may be surgically absent. Regardless, there are no inflammatory changes noted adjacent to the cecum to suggest the presence of an acute appendicitis at this time. Vascular/Lymphatic: Atherosclerotic calcifications throughout the abdominal aorta and pelvic vasculature, without evidence of aneurysm or dissection. No lymphadenopathy noted in the abdomen or pelvis. Reproductive: Uterus and ovaries are atrophic. Coarse calcifications in the uterine body, likely reflective of a small fibroid. Other: No significant volume of ascites.  No pneumoperitoneum. Musculoskeletal: There are no aggressive appearing  lytic or blastic lesions noted in the visualized portions of the skeleton. IMPRESSION: 1. Subcapsular fluid collection adjacent to the superior aspect of the right lobe of the liver. This is unusual in appearance, but favored to be chronic given the presence of an indwelling catheter in this  region on remote prior CT scan 05/12/2020. Although this may simply represent a chronic sterile fluid collection such as a seroma, the possibility of recurrent abscess is not excluded. Further clinical evaluation is recommended. 2. No other acute findings are noted elsewhere in the abdomen or pelvis to account for the patient's symptoms. 3. Extensive colonic diverticulosis without evidence of acute diverticulitis at this time. 4. Aortic atherosclerosis, in addition to at least right coronary artery disease. 5. Additional incidental findings, as above. Electronically Signed   By: Trudie Reed M.D.   On: 02/19/2022 05:21    Assessment/Plan 1. Agitation Patient apparently doing well on current regimen of risperidone, Lexapro and Namenda  2. Depression with anxiety Continue Lexapro 10 mg  3. Gastroesophageal reflux disease, unspecified whether esophagitis present Symptoms are controlled with Prilosec 20 mg/day  4. Unsteady gait Ambulates primarily with wheelchair can walk some holding onto furniture  5. 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  Reduced Seroquel from 25 to 12.5 mg twice daily.  Discontinue Aricept    Family/ staff Communication:   Labs/tests ordered:  Bertram Millard. Hyacinth Meeker, MD [facility] [address] [place], [place] [address] Office [phone]

## 2022-06-09 ENCOUNTER — Encounter: Payer: Self-pay | Admitting: Nurse Practitioner

## 2022-06-09 ENCOUNTER — Non-Acute Institutional Stay (SKILLED_NURSING_FACILITY): Payer: Medicare Other | Admitting: Nurse Practitioner

## 2022-06-09 DIAGNOSIS — F418 Other specified anxiety disorders: Secondary | ICD-10-CM

## 2022-06-09 DIAGNOSIS — K219 Gastro-esophageal reflux disease without esophagitis: Secondary | ICD-10-CM

## 2022-06-09 NOTE — Assessment & Plan Note (Signed)
Stable,  takes Lexpro, Clonazepam, Depakote, Quetiapine,  Hydroxyzine nightly. Na 137 03/07/22

## 2022-06-09 NOTE — Assessment & Plan Note (Signed)
stable, taking Omeprazole. Hgb 13.6 03/17/22

## 2022-06-09 NOTE — Assessment & Plan Note (Signed)
takes Depakote and Seroquel. Vistaril hs. On Namenda. 07/22/21 MMSE 21/30

## 2022-06-09 NOTE — Progress Notes (Unsigned)
Location:  [facility] Room Number: 103-A Place of Service:  SNF (31) Provider:  ManXie Hermina Barnard,NP  Dewanda Fennema X, NP  Patient Care Team: Sakeena Teall X, NP as PCP - General (Internal Medicine)  Extended Emergency Contact Information Primary Emergency Contact: Krista Blue Address: [place], [place] [address] [name] of [place] Phone: [phone] Mobile Phone: [phone] Relation: Relative Secondary Emergency Contact: [place] Home Phone: [phone] Mobile Phone: [phone] Relation: Relative  Code Status:  DNR Goals of care: Advanced Directive information    06/09/2022    9:59 AM  Advanced Directives  Does Patient Have a Medical Advance Directive? Yes  Type of Paramedic of [place];Living will;Out of facility DNR (pink MOST or yellow form)  Does patient want to make changes to medical advance directive? No - Patient declined  Copy of [place] in Chart? Yes - validated most recent copy scanned in chart (See row information)     Chief Complaint  Patient presents with   Medical Management of Chronic Issues    HPI:  Pt is a 89 y.o. female seen today for medical management of chronic diseases.    Serum Ca 7.7 02/19/22,  off Ca supplement, PTH<10             Dementia/behavioral issues, takes Depakote and Seroquel. Vistaril hs. On Namenda. 07/22/21 MMSE 21/30             Depression/anxiety, takes Lexpro, Clonazepam, Depakote, Quetiapine,  Hydroxyzine nightly. Na 137 03/07/22 CKD stable. Bun/creat 13/1.23 03/07/22 Elevated TSH 6.72 02/15/21>>2.7 05/14/21.  Gait abnormality, furniture or walker, needs reminder for safety.  GERD, stable, taking Omeprazole. Hgb 13.6 03/17/22 Seroma per surgeon consultation 02/19/22 CT abd/pelvis with fluid collection in the liver at the same site of prior abscess in 2021 Past Medical History:  Diagnosis Date   Arthritis    Dementia ([place])     Hx of concussion    [place]   Hyperlipidemia    Vitamin D deficiency, unspecified    Past Surgical History:  Procedure Laterality Date   CHOLECYSTECTOMY      Allergies  Allergen Reactions   Fish-Derived Products    Pravachol [Pravastatin Sodium]     Outpatient Encounter Medications as of 06/09/2022  Medication Sig   Artificial Tears ophthalmic solution Place 3 drops into both eyes 3 (three) times daily. related to Dry eye syndrome of bilateral lacrimal glands   clonazePAM (KLONOPIN) 0.5 MG tablet Take 1 tablet (0.5 mg total) by mouth at bedtime.   divalproex (DEPAKOTE) 125 MG DR tablet Take 125 mg by mouth 3 (three) times daily. * DO NOT CRUSH OR CHEW* *HAZARDOUS DRUG: WEAR GLOVES*   escitalopram (LEXAPRO) 10 MG tablet Take 10 mg by mouth daily.   hydrOXYzine (VISTARIL) 25 MG capsule Take 25 mg by mouth at bedtime.   memantine (NAMENDA) 10 MG tablet Take 10 mg by mouth 2 (two) times daily.   omeprazole (PRILOSEC) 20 MG capsule Take 20 mg by mouth daily.   QUEtiapine (SEROQUEL) 25 MG tablet Take 12.5 mg by mouth 2 (two) times daily.   ondansetron (ZOFRAN) 4 MG tablet Take 1 tablet (4 mg total) by mouth every 8 (eight) hours as needed for nausea or vomiting. (Patient not taking: Reported on 06/09/2022)   promethazine (PHENERGAN) 25 MG/ML injection Inject 25 mg into the vein every 6 (six) hours as needed for nausea or vomiting.   [  DISCONTINUED] donepezil (ARICEPT) 5 MG tablet Take 5 mg by mouth 2 (two) times daily.   [DISCONTINUED] POLYVINYL ALCOHOL-POVIDONE OP Place 2 drops into both eyes 3 (three) times daily. drops; 0.5-0.6 %; amt: 2 drops each eye; ophthalmic (eye)   No facility-administered encounter medications on file as of 06/09/2022.    Review of Systems  Constitutional:  Negative for appetite change, fatigue and fever.  HENT:  Positive for hearing loss. Negative for congestion and voice change.   Eyes:  Negative for visual disturbance.   Respiratory:  Negative for cough.   Cardiovascular:  Negative for leg swelling.  Gastrointestinal:  Negative for abdominal pain and constipation.  Genitourinary:  Negative for dysuria, frequency and urgency.  Musculoskeletal:  Positive for arthralgias and gait problem. Negative for myalgias.       Rails or furniture walking sometimes.   Skin:  Negative for color change.  Neurological:  Negative for speech difficulty, weakness and headaches.       Dementia  Psychiatric/Behavioral:  Positive for agitation, behavioral problems and confusion. Negative for sleep disturbance. The patient is nervous/anxious.        Less emotional, behavioral outbursts     Immunization History  Administered Date(s) Administered   Influenza-Unspecified 02/26/2019, 03/07/2020, 03/13/2021, 03/24/2022   Moderna Sars-Covid-2 Vaccination 06/25/2019, 07/23/2019, 04/03/2020, 10/23/2020, 10/11/2021   PFIZER(Purple Top)SARS-COV-2 Vaccination 02/12/2021   Pneumococcal Conjugate-13 12/31/2018   Pneumococcal Polysaccharide-23 02/24/2004   Pneumococcal-Unspecified 05/22/2016   Tdap 10/03/2017, 01/13/2021   Zoster Recombinat (Shingrix) 04/29/2021   Zoster, Live 05/26/2009   Zoster, Unspecified 05/26/2009   Pertinent  Health Maintenance Due  Topic Date Due   DEXA SCAN  Never done   INFLUENZA VACCINE  Completed      05/13/2020    8:00 PM 05/14/2020    8:07 AM 05/22/2020    7:52 PM 01/13/2021    6:42 PM 02/19/2022   12:53 AM  Fall Risk  (RETIRED) Patient Fall Risk Level High fall risk High fall risk High fall risk Moderate fall risk High fall risk   Functional Status Survey:    Vitals:   06/09/22 0955  BP: (!) 148/90  Pulse: 78  Resp: 18  Temp: (!) 97.2 F (36.2 C)  SpO2: 97%  Weight: 170 lb 3.2 oz (77.2 kg)  Height: 5\' 6"  (1.676 m)   Body mass index is 27.47 kg/m. Physical Exam Vitals and nursing note reviewed.  Constitutional:      Appearance: Normal appearance.  HENT:     Head: Normocephalic  and atraumatic.     Mouth/Throat:     Mouth: Mucous membranes are moist.  Eyes:     Extraocular Movements: Extraocular movements intact.     Conjunctiva/sclera: Conjunctivae normal.     Pupils: Pupils are equal, round, and reactive to light.  Cardiovascular:     Rate and Rhythm: Normal rate and regular rhythm.     Heart sounds: No murmur heard. Pulmonary:     Effort: Pulmonary effort is normal.     Breath sounds: No rales.  Abdominal:     General: Bowel sounds are normal.     Palpations: Abdomen is soft.     Tenderness: There is no abdominal tenderness.  Musculoskeletal:     Right lower leg: No edema.     Left lower leg: No edema.  Skin:    General: Skin is warm and dry.  Neurological:     General: No focal deficit present.     Mental Status: She is alert. Mental status  is at baseline.     Gait: Gait abnormal.     Comments: Oriented to person  Psychiatric:     Comments: Confused, pleasant, conversing.      Labs reviewed: Recent Labs    10/22/21 0000 12/13/21 0000 02/19/22 0235  NA 134* 135* 137  K 4.3 4.3  4.3 3.9  CL 96* 98*  98* 106  CO2 31* 25*  25* 24  GLUCOSE  --   --  117*  BUN 13 14 14   CREATININE 1.2* 1.3*  1.3* 0.86  CALCIUM 8.8 9.2  9.2 7.7*   Recent Labs    10/22/21 0000 12/13/21 0000 02/19/22 0235  AST 11* 12* 18  ALT 7 6* 9  ALKPHOS 80 80 62  BILITOT  --   --  0.7  PROT  --   --  6.2*  ALBUMIN 3.7 4.0 2.8*   Recent Labs    06/12/21 0000 09/07/21 0000 12/13/21 0000 02/19/22 0116  WBC 5.3 5.3 5.7 10.4  NEUTROABS 3,217.00 2,756.00 4,047.00  --   HGB 12.1 12.0 13.0 13.5  HCT 38 37 39 40.0  MCV  --   --   --  87.5  PLT 184 201 204 214   Lab Results  Component Value Date   TSH 2.70 05/15/2021   No results found for: "HGBA1C" No results found for: "CHOL", "HDL", "LDLCALC", "LDLDIRECT", "TRIG", "CHOLHDL"  Significant Diagnostic Results in last 30 days:  No results found.  Assessment/Plan Due Shingrix, DEXA  GERD  (gastroesophageal reflux disease)  stable, taking Omeprazole. Hgb 13.6 03/17/22  Depression with anxiety Stable,  takes Lexpro, Clonazepam, Depakote, Quetiapine,  Hydroxyzine nightly. Na 137 03/07/22  Vascular dementia ([place])  takes Depakote and Seroquel. Vistaril hs. On Namenda. 07/22/21 MMSE 21/30   Family/ staff Communication: plan of care reviewed with the patient and charge nurse.   Labs/tests ordered:  none  Time spend 25 minutes.

## 2022-06-24 ENCOUNTER — Encounter: Payer: Self-pay | Admitting: Nurse Practitioner

## 2022-06-24 ENCOUNTER — Non-Acute Institutional Stay: Payer: Medicare Other | Admitting: Nurse Practitioner

## 2022-06-24 DIAGNOSIS — F0390 Unspecified dementia without behavioral disturbance: Secondary | ICD-10-CM

## 2022-06-24 DIAGNOSIS — Z0000 Encounter for general adult medical examination without abnormal findings: Secondary | ICD-10-CM | POA: Diagnosis not present

## 2022-06-24 DIAGNOSIS — M810 Age-related osteoporosis without current pathological fracture: Secondary | ICD-10-CM | POA: Diagnosis not present

## 2022-06-24 NOTE — Assessment & Plan Note (Signed)
DEXA 06/20/22 t score -2.597, may consider Fosamax if HPOA desires.

## 2022-06-24 NOTE — Progress Notes (Addendum)
Subjective:   Sarah Larsen is a 89 y.o. female who presents for Medicare Annual (Subsequent) preventive examination @ SNF [facility] [address].  Cardiac Risk Factors include: advanced age (>41men, >50 women);dyslipidemia     Objective:    Today's Vitals   06/24/22 1421  BP: 126/64  Pulse: 81  Resp: 18  Temp: (!) 97.2 F (36.2 C)  SpO2: 97%  Weight: 169 lb 9.6 oz (76.9 kg)  Height: 5\' 6"  (1.676 m)   Body mass index is 27.37 kg/m.     06/27/2022   11:00 AM 06/24/2022    2:30 PM 06/09/2022    9:59 AM 03/05/2022   10:39 AM 02/19/2022   11:55 AM 02/19/2022   12:52 AM 01/28/2022   11:08 AM  Advanced Directives  Does Patient Have a Medical Advance Directive? Yes Yes Yes  Yes No Yes  Type of 03/30/2022 of [place];Living will;Out of facility DNR (pink MOST or yellow form) Healthcare Power of [place];Living will;Out of facility DNR (pink MOST or yellow form) Healthcare Power of [place];Living will;Out of facility DNR (pink MOST or yellow form) Healthcare Power of [place];Living will;Out of facility DNR (pink MOST or yellow form) Healthcare Power of [place];Living will;Out of facility DNR (pink MOST or yellow form)  Healthcare Power of [place];Living will;Out of facility DNR (pink MOST or yellow form)  Does patient want to make changes to medical advance directive? No - Patient declined No - Patient declined No - Patient declined  No - Patient declined  No - Patient declined  Copy of Healthcare Power of Attorney in Chart? Yes - validated most recent copy scanned in chart (See row information) Yes - validated most recent copy scanned in chart (See row information) Yes - validated most recent copy scanned in chart (See row information) Yes - validated most recent copy scanned in chart (See row information) Yes - validated most recent copy scanned in chart (See row information)  Yes - validated most recent copy scanned in chart (See row information)  Would patient like  information on creating a medical advance directive?      No - Patient declined   Pre-existing out of facility DNR order (yellow form or pink MOST form)     Yellow form placed in chart (order not valid for inpatient use)      Current Medications (verified) Outpatient Encounter Medications as of 06/24/2022  Medication Sig   Artificial Tears ophthalmic solution Place 3 drops into both eyes 3 (three) times daily. related to Dry eye syndrome of bilateral lacrimal glands   clonazePAM (KLONOPIN) 0.5 MG tablet Take 1 tablet (0.5 mg total) by mouth at bedtime.   divalproex (DEPAKOTE) 125 MG DR tablet Take 125 mg by mouth 3 (three) times daily. * DO NOT CRUSH OR CHEW* *HAZARDOUS DRUG: WEAR GLOVES*   escitalopram (LEXAPRO) 10 MG tablet Take 10 mg by mouth daily.   hydrOXYzine (VISTARIL) 25 MG capsule Take 25 mg by mouth at bedtime.   memantine (NAMENDA) 10 MG tablet Take 10 mg by mouth 2 (two) times daily.   omeprazole (PRILOSEC) 20 MG capsule Take 20 mg by mouth daily.   ondansetron (ZOFRAN) 4 MG tablet Take 1 tablet (4 mg total) by mouth every 8 (eight) hours as needed for nausea or vomiting.   QUEtiapine (SEROQUEL) 25 MG tablet Take 12.5 mg by mouth 2 (two) times daily.   promethazine (PHENERGAN) 25 MG/ML injection Inject 25 mg into the vein every 6 (six) hours as needed for  nausea or vomiting.   No facility-administered encounter medications on file as of 06/24/2022.    Allergies (verified) Fish-derived products and Pravachol [pravastatin sodium]   History: Past Medical History:  Diagnosis Date   Arthritis    Dementia ([place])    Hx of concussion    [place]   Hyperlipidemia    Vitamin D deficiency, unspecified    Past Surgical History:  Procedure Laterality Date   CHOLECYSTECTOMY     Family History  Family history unknown: Yes   Social History   Socioeconomic History   Marital status: Single    Spouse name: Not on file   Number of children: Not on file    Years of education: Not on file   Highest education level: Not on file  Occupational History   Not on file  Tobacco Use   Smoking status: Never   Smokeless tobacco: Never  Vaping Use   Vaping Use: Never used  Substance and Sexual Activity   Alcohol use: Never   Drug use: Never   Sexual activity: Never  Other Topics Concern   Not on file  Social History Narrative   Not on file   Social Determinants of Health   Financial Resource Strain: Not on file  Food Insecurity: Not on file  Transportation Needs: Not on file  Physical Activity: Not on file  Stress: Not on file  Social Connections: Not on file    Tobacco Counseling Counseling given: Not Answered   Clinical Intake:  Pre-visit preparation completed: Yes  Pain : No/denies pain     BMI - recorded: 27.37 Nutritional Status: BMI 25 -29 Overweight Nutritional Risks: Other (Comment) Diabetes: No  How often do you need to have someone help you when you read instructions, pamphlets, or other written materials from your doctor or pharmacy?: 4 - Often What is the last grade level you completed in school?: college  Diabetic?no  Interpreter Needed?: No  Information entered by :: Lulu Hirschmann Bretta Bang NP   Activities of Daily Living    06/24/2022    4:00 PM 07/16/2021   12:00 PM  In your present state of health, do you have any difficulty performing the following activities:  Hearing? 0 0  Vision? 0   Difficulty concentrating or making decisions? 1 1  Walking or climbing stairs? 1 1  Dressing or bathing? 1 1  Doing errands, shopping? 1 1  Preparing Food and eating ? N N  Using the Toilet? N N  In the past six months, have you accidently leaked urine? Y Y  Do you have problems with loss of bowel control? N N  Managing your Medications? Y Y  Managing your Finances? Tempie Donning  Housekeeping or managing your Housekeeping? Tempie Donning    Patient Care Team: Sie Formisano X, NP as PCP - General (Internal Medicine)  Indicate any recent  Medical Services you may have received from other than [facility] providers in the past year (date may be approximate).     Assessment:   This is a routine wellness examination for [facility].  Hearing/Vision screen No results found.  Dietary issues and exercise activities discussed: Current Exercise Habits: The patient does not participate in regular exercise at present, Exercise limited by: orthopedic condition(s)   Goals Addressed             This Visit's Progress    Maintain Mobility and Function       Evidence-based guidance:  Acknowledge and validate impact of pain,  loss of strength and potential disfigurement (hand osteoarthritis) on mental health and daily life, such as social isolation, anxiety, depression, impaired sexual relationship and   injury from falls.  Anticipate referral to physical or occupational therapy for assessment, therapeutic exercise and recommendation for adaptive equipment or assistive devices; encourage participation.  Assess impact on ability to perform activities of daily living, as well as engage in sports and leisure events or requirements of work or school.  Provide anticipatory guidance and reassurance about the benefit of exercise to maintain function; acknowledge and normalize fear that exercise may worsen symptoms.  Encourage regular exercise, at least 10 minutes at a time for 45 minutes per week; consider yoga, water exercise and proprioceptive exercises; encourage use of wearable activity tracker to increase motivation and adherence.  Encourage maintenance or resumption of daily activities, including employment, as pain allows and with minimal exposure to trauma.  Assist patient to advocate for adaptations to the work environment.  Consider level of pain and function, gender, age, lifestyle, patient preference, quality of life, readiness and ?ocapacity to benefit? when recommending patients for orthopaedic surgery consultation.  Explore strategies, such  as changes to medication regimen or activity that enables patient to anticipate and manage flare-ups that increase deconditioning and disability.  Explore patient preferences; encourage exposure to a broader range of activities that have been avoided for fear of experiencing pain.  Identify barriers to participation in therapy or exercise, such as pain with activity, anticipated or imagined pain.  Monitor postoperative joint replacement or any preexisting joint replacement for ongoing pain and loss of function; provide social support and encouragement throughout recovery.   Notes:        Depression Screen     No data to display          Fall Risk    06/27/2022   11:00 AM 06/24/2022    2:30 PM  [place] in the past year? 0 0  Number falls in past yr:  0  Injury with Fall? 0 0  Risk for fall due to : No Fall Risks No Fall Risks  Follow up Falls evaluation completed Falls evaluation completed    [place]:  Any stairs in or around the home? Yes  If so, are there any without handrails? No  Home free of loose throw rugs in walkways, pet beds, electrical cords, etc? Yes  Adequate lighting in your home to reduce risk of falls? Yes   ASSISTIVE DEVICES UTILIZED TO PREVENT FALLS:  Life alert? No  Use of a cane, walker or w/c? Yes  Grab bars in the bathroom? Yes  Shower chair or bench in shower? Yes  Elevated toilet seat or a handicapped toilet? Yes   TIMED UP AND GO:  Was the test performed? Yes .  Length of time to ambulate 10 feet: 16 sec.   Gait slow and steady with assistive device  Cognitive Function:    06/27/2022    1:53 PM 07/30/2021   11:06 AM  MMSE - Mini Mental State Exam  Orientation to time 0 1  Orientation to Place 1 3  Registration 2 3  Attention/ Calculation 0 5  Recall 0 0  Language- name 2 objects 2 2  Language- repeat 1 1  Language- follow 3 step command 3 3  Language- read & follow direction 1 1  Write a  sentence 0 1  Copy design 0 1  Total score 10 21  Immunizations Immunization History  Administered Date(s) Administered   Influenza-Unspecified 02/26/2019, 03/07/2020, 03/13/2021, 03/24/2022   Moderna Covid-19 Vaccine Bivalent Booster 79yrs & up 03/27/2022   Moderna Sars-Covid-2 Vaccination 06/25/2019, 07/23/2019, 04/03/2020, 10/23/2020, 10/11/2021   PFIZER(Purple Top)SARS-COV-2 Vaccination 02/12/2021   Pneumococcal Conjugate-13 12/31/2018   Pneumococcal Polysaccharide-23 02/24/2004   Pneumococcal-Unspecified 05/22/2016   Respiratory Syncytial Virus Vaccine,Recomb Aduvanted(Arexvy) 06/13/2022   Tdap 10/03/2017, 01/13/2021   Zoster Recombinat (Shingrix) 04/29/2021, 06/26/2021   Zoster, Live 06/12/2022    TDAP status: Up to date  Flu Vaccine status: Up to date  Pneumococcal vaccine status: Up to date  Covid-19 vaccine status: Information provided on how to obtain vaccines.   Qualifies for Shingles Vaccine? Yes   Zostavax completed Yes   Shingrix Completed?: Yes  Screening Tests Health Maintenance  Topic Date Due   COVID-19 Vaccine (8 - 2023-24 season) 05/22/2022   Medicare Annual Wellness (AWV)  06/25/2023   DTaP/Tdap/Td (3 - Td or Tdap) 01/14/2031   Pneumonia Vaccine 71+ Years old  Completed   INFLUENZA VACCINE  Completed   DEXA SCAN  Completed   Zoster Vaccines- Shingrix  Completed   HPV VACCINES  Aged Out    Health Maintenance  Health Maintenance Due  Topic Date Due   COVID-19 Vaccine (8 - 2023-24 season) 05/22/2022    Colorectal cancer screening: No longer required.   Mammogram status: No longer required due to aged out.  Bone Density status: Ordered 06/24/22. Pt provided with contact info and advised to call to schedule appt.  Lung Cancer Screening: (Low Dose CT Chest recommended if Age 45-80 years, 30 pack-year currently smoking OR have quit w/in 15years.) does not qualify.    Additional Screening:  Hepatitis C Screening: does not  qualify  Vision Screening: Recommended annual ophthalmology exams for early detection of glaucoma and other disorders of the eye. Is the patient up to date with their annual eye exam?  No  Who is the provider or what is the name of the office in which the patient attends annual eye exams? HPOA will provide.  If pt is not established with a provider, would they like to be referred to a provider to establish care? No .   Dental Screening: Recommended annual dental exams for proper oral hygiene  Community Resource Referral / Chronic Care Management: CRR required this visit?  No   CCM required this visit?  No      Plan:     I have personally reviewed and noted the following in the patient's chart:   Medical and social history Use of alcohol, tobacco or illicit drugs  Current medications and supplements including opioid prescriptions. Patient is not currently taking opioid prescriptions. Functional ability and status Nutritional status Physical activity Advanced directives List of other physicians Hospitalizations, surgeries, and ER visits in previous 12 months Vitals Screenings to include cognitive, depression, and falls Referrals and appointments  In addition, I have reviewed and discussed with patient certain preventive protocols, quality metrics, and best practice recommendations. A written personalized care plan for preventive services as well as general preventive health recommendations were provided to patient.     Arrin Ishler X Reylynn Vanalstine, NP   06/27/2022

## 2022-06-27 ENCOUNTER — Encounter: Payer: Self-pay | Admitting: Nurse Practitioner

## 2022-06-27 NOTE — Progress Notes (Unsigned)
Location:  [facility] Room Number: NO/103/A Place of Service:  SNF (31) Provider:  Kishon Garriga X, NP  Patient Care Team: Yamna Mackel X, NP as PCP - General (Internal Medicine)  Extended Emergency Contact Information Primary Emergency Contact: Krista Blue Address: [place], [place] [address] [name] of [place] Phone: [phone] Mobile Phone: [phone] Relation: Relative Secondary Emergency Contact: West,Doug Home Phone: [phone] Mobile Phone: [phone] Relation: Relative  Code Status:  DNR Goals of care: Advanced Directive information    06/27/2022   11:00 AM  Advanced Directives  Does Patient Have a Medical Advance Directive? Yes  Type of Paramedic of [place];Living will;Out of facility DNR (pink MOST or yellow form)  Does patient want to make changes to medical advance directive? No - Patient declined  Copy of [place] in Chart? Yes - validated most recent copy scanned in chart (See row information)     Chief Complaint  Patient presents with  . Medical Management of Chronic Issues    Patient is here for a follow up for chronic conditions   . Error    HPI:  Pt is a 89 y.o. female seen today for medical management of chronic diseases.     Past Medical History:  Diagnosis Date  . Arthritis   . Dementia ([place])   . Hx of concussion    [place]  . Hyperlipidemia   . Vitamin D deficiency, unspecified    Past Surgical History:  Procedure Laterality Date  . CHOLECYSTECTOMY      Allergies  Allergen Reactions  . Fish-Derived Products   . Pravachol [Pravastatin Sodium]     Outpatient Encounter Medications as of 06/27/2022  Medication Sig  . Artificial Tears ophthalmic solution Place 3 drops into both eyes 3 (three) times daily. related to Dry eye syndrome of bilateral lacrimal glands  . clonazePAM (KLONOPIN) 0.5 MG tablet  Take 1 tablet (0.5 mg total) by mouth at bedtime.  . divalproex (DEPAKOTE) 125 MG DR tablet Take 125 mg by mouth 3 (three) times daily. * DO NOT CRUSH OR CHEW* *HAZARDOUS DRUG: WEAR GLOVES*  . escitalopram (LEXAPRO) 10 MG tablet Take 10 mg by mouth daily.  . hydrOXYzine (VISTARIL) 25 MG capsule Take 25 mg by mouth at bedtime.  . memantine (NAMENDA) 10 MG tablet Take 10 mg by mouth 2 (two) times daily.  Marland Kitchen omeprazole (PRILOSEC) 20 MG capsule Take 20 mg by mouth daily.  . ondansetron (ZOFRAN) 4 MG tablet Take 1 tablet (4 mg total) by mouth every 8 (eight) hours as needed for nausea or vomiting.  Marland Kitchen QUEtiapine (SEROQUEL) 25 MG tablet Take 12.5 mg by mouth 2 (two) times daily.  . promethazine (PHENERGAN) 25 MG/ML injection Inject 25 mg into the vein every 6 (six) hours as needed for nausea or vomiting.   No facility-administered encounter medications on file as of 06/27/2022.    Review of Systems  Immunization History  Administered Date(s) Administered  . Influenza-Unspecified 02/26/2019, 03/07/2020, 03/13/2021, 03/24/2022  . Moderna Covid-19 Vaccine Bivalent Booster 48yrs & up 03/27/2022  . Moderna Sars-Covid-2 Vaccination 06/25/2019, 07/23/2019, 04/03/2020, 10/23/2020, 10/11/2021  . PFIZER(Purple Top)SARS-COV-2 Vaccination 02/12/2021  . Pneumococcal Conjugate-13 12/31/2018  . Pneumococcal Polysaccharide-23 02/24/2004  . Pneumococcal-Unspecified 05/22/2016  . Respiratory Syncytial Virus Vaccine,Recomb Aduvanted(Arexvy) 06/13/2022  . Tdap 10/03/2017, 01/13/2021  . Zoster Recombinat (Shingrix) 04/29/2021, 06/26/2021  . Zoster, Live 06/12/2022   Pertinent  Health Maintenance Due  Topic Date Due  . INFLUENZA VACCINE  Completed  . DEXA SCAN  Completed      05/22/2020    7:52 PM 01/13/2021    6:42 PM 02/19/2022   12:53 AM 06/24/2022    2:30 PM 06/27/2022   11:00 AM  Fall Risk  Falls in the past year?    0 0  Was there an injury with Fall?    0 0  Fall Risk Category Calculator    0    (RETIRED) Patient Fall Risk Level High fall risk Moderate fall risk High fall risk    Patient at Risk for Falls Due to    No Fall Risks No Fall Risks  Fall risk Follow up    Falls evaluation completed Falls evaluation completed   Functional Status Survey:    Vitals:   06/27/22 1104  BP: 98/63  Pulse: 85  Resp: 18  Temp: (!) 97.5 F (36.4 C)  SpO2: 97%  Weight: 172 lb 12.8 oz (78.4 kg)  Height: 5\' 6"  (1.676 m)   Body mass index is 27.89 kg/m. Physical Exam  Labs reviewed: Recent Labs    10/22/21 0000 12/13/21 0000 02/19/22 0235  NA 134* 135* 137  K 4.3 4.3  4.3 3.9  CL 96* 98*  98* 106  CO2 31* 25*  25* 24  GLUCOSE  --   --  117*  BUN 13 14 14   CREATININE 1.2* 1.3*  1.3* 0.86  CALCIUM 8.8 9.2  9.2 7.7*    Recent Labs    10/22/21 0000 12/13/21 0000 02/19/22 0235  AST 11* 12* 18  ALT 7 6* 9  ALKPHOS 80 80 62  BILITOT  --   --  0.7  PROT  --   --  6.2*  ALBUMIN 3.7 4.0 2.8*    Recent Labs    09/07/21 0000 12/13/21 0000 02/19/22 0116  WBC 5.3 5.7 10.4  NEUTROABS 2,756.00 4,047.00  --   HGB 12.0 13.0 13.5  HCT 37 39 40.0  MCV  --   --  87.5  PLT 201 204 214    Lab Results  Component Value Date   TSH 2.70 05/15/2021   No results found for: "HGBA1C" No results found for: "CHOL", "HDL", "LDLCALC", "LDLDIRECT", "TRIG", "CHOLHDL"  Significant Diagnostic Results in last 30 days:  No results found.  Assessment/Plan There are no diagnoses linked to this encounter.   Family/ staff Communication: ***  Labs/tests ordered:  ***   This encounter was created in error - please disregard. This encounter was created in error - please disregard. This encounter was created in error - please disregard.

## 2022-06-30 NOTE — Progress Notes (Signed)
This encounter was created in error - please disregard.

## 2022-06-30 NOTE — Progress Notes (Deleted)
This encounter was created in error - please disregard.

## 2022-07-14 ENCOUNTER — Non-Acute Institutional Stay (SKILLED_NURSING_FACILITY): Payer: Medicare Other | Admitting: Family Medicine

## 2022-07-14 DIAGNOSIS — R2681 Unsteadiness on feet: Secondary | ICD-10-CM

## 2022-07-14 DIAGNOSIS — F418 Other specified anxiety disorders: Secondary | ICD-10-CM | POA: Diagnosis not present

## 2022-07-14 DIAGNOSIS — R451 Restlessness and agitation: Secondary | ICD-10-CM

## 2022-07-14 DIAGNOSIS — F0390 Unspecified dementia without behavioral disturbance: Secondary | ICD-10-CM | POA: Diagnosis not present

## 2022-07-14 NOTE — Progress Notes (Signed)
Provider:  Alain Honey, MD Location:      Place of Service:     PCP: Mast, Man X, NP Patient Care Team: Mast, Man X, NP as PCP - General (Internal Medicine)  Extended Emergency Contact Information Primary Emergency Contact: Krista Blue Address: [place], [place] [address] [name] of [place] Phone: [phone] Mobile Phone: [phone] Relation: Relative Secondary Emergency Contact: West,Doug Home Phone: [phone] Mobile Phone: [phone] Relation: Relative  Code Status:  Goals of Care: Advanced Directive information    06/27/2022   11:00 AM  Advanced Directives  Does Patient Have a Medical Advance Directive? Yes  Type of Paramedic of [place];Living will;Out of facility DNR (pink MOST or yellow form)  Does patient want to make changes to medical advance directive? No - Patient declined  Copy of [place] in Chart? Yes - validated most recent copy scanned in chart (See row information)      No chief complaint on file.   HPI: [name] seen today for medical management of chronic problems including dementia with behavioral issues, depression and anxiety, and GERD. Staff reports no issues or problems.  Generally she has done well recently with behaviors.  This was questioned specifically with the idea of trying to taper her antipsychotic medicine. Appetite is reported okay sleep is reported okay although she was napping and midmorning when I visited her in the living room in memory care.  She voiced no complaints.  Past Medical History:  Diagnosis Date   Arthritis    Dementia ([place])    Hx of concussion    [place]   Hyperlipidemia    Vitamin D deficiency, unspecified    Past Surgical History:  Procedure Laterality Date   CHOLECYSTECTOMY      reports that she has never smoked. She has never used smokeless tobacco. She reports that  she does not drink alcohol and does not use drugs. Social History   Socioeconomic History   Marital status: Single    Spouse name: Not on file   Number of children: Not on file   Years of education: Not on file   Highest education level: Not on file  Occupational History   Not on file  Tobacco Use   Smoking status: Never   Smokeless tobacco: Never  Vaping Use   Vaping Use: Never used  Substance and Sexual Activity   Alcohol use: Never   Drug use: Never   Sexual activity: Never  Other Topics Concern   Not on file  Social History Narrative   Not on file   Social Determinants of Health   Financial Resource Strain: Not on file  Food Insecurity: Not on file  Transportation Needs: Not on file  Physical Activity: Not on file  Stress: Not on file  Social Connections: Not on file  Intimate Partner Violence: Not on file    Functional Status Survey:    Family History  Family history unknown: Yes    Health Maintenance  Topic Date Due   COVID-19 Vaccine (8 - 2023-24 season) 05/22/2022   Medicare Annual Wellness (AWV)  06/25/2023   DTaP/Tdap/Td (3 - Td or Tdap) 01/14/2031   Pneumonia Vaccine 58+ Years old  Completed   INFLUENZA VACCINE  Completed   DEXA SCAN  Completed   Zoster Vaccines- Shingrix  Completed   HPV VACCINES  Aged Out    Allergies  Allergen Reactions   Fish-Derived Products    Pravachol [Pravastatin Sodium]     Outpatient Encounter Medications as of 07/14/2022  Medication Sig   Artificial Tears ophthalmic solution Place 3 drops into both eyes 3 (three) times daily. related to Dry eye syndrome of bilateral lacrimal glands   clonazePAM (KLONOPIN) 0.5 MG tablet Take 1 tablet (0.5 mg total) by mouth at bedtime.   divalproex (DEPAKOTE) 125 MG DR tablet Take 125 mg by mouth 3 (three) times daily. * DO NOT CRUSH OR CHEW* *HAZARDOUS DRUG: WEAR GLOVES*   escitalopram (LEXAPRO) 10 MG tablet Take 10 mg by mouth daily.   hydrOXYzine (VISTARIL) 25 MG capsule Take  25 mg by mouth at bedtime.   memantine (NAMENDA) 10 MG tablet Take 10 mg by mouth 2 (two) times daily.   omeprazole (PRILOSEC) 20 MG capsule Take 20 mg by mouth daily.   ondansetron (ZOFRAN) 4 MG tablet Take 1 tablet (4 mg total) by mouth every 8 (eight) hours as needed for nausea or vomiting.   promethazine (PHENERGAN) 25 MG/ML injection Inject 25 mg into the vein every 6 (six) hours as needed for nausea or vomiting.   QUEtiapine (SEROQUEL) 25 MG tablet Take 12.5 mg by mouth 2 (two) times daily.   No facility-administered encounter medications on file as of 07/14/2022.    Review of Systems  Constitutional: Negative.   Respiratory: Negative.    Cardiovascular: Negative.   Musculoskeletal:  Positive for arthralgias.  Psychiatric/Behavioral:  Positive for behavioral problems and confusion.   All other systems reviewed and are negative.   There were no vitals filed for this visit. There is no height or weight on file to calculate BMI. Physical Exam Vitals and nursing note reviewed.  Constitutional:      Appearance: Normal appearance.  HENT:     Head: Normocephalic.  Cardiovascular:     Rate and Rhythm: Normal rate and regular rhythm.  Pulmonary:     Effort: Pulmonary effort is normal.     Breath sounds: Normal breath sounds.  Musculoskeletal:     Comments: Uses walker for ambulation  Skin:    General: Skin is warm and dry.  Neurological:     General: No focal deficit present.     Mental Status: She is alert.     Comments: Oriented to person but not place or time To carry on simple conversation  Psychiatric:        Mood and Affect: Mood normal.        Behavior: Behavior normal.     Labs reviewed: Basic Metabolic Panel: Recent Labs    10/22/21 0000 12/13/21 0000 02/19/22 0235  NA 134* 135* 137  K 4.3 4.3  4.3 3.9  CL 96* 98*  98* 106  CO2 31* 25*  25* 24  GLUCOSE  --   --  117*  BUN 13 14 14  $ CREATININE 1.2* 1.3*  1.3* 0.86  CALCIUM 8.8 9.2  9.2 7.7*    Liver Function Tests: Recent Labs    10/22/21 0000 12/13/21 0000 02/19/22 0235  AST 11* 12* 18  ALT 7 6* 9  ALKPHOS 80 80 62  BILITOT  --   --  0.7  PROT  --   --  6.2*  ALBUMIN 3.7 4.0 2.8*   Recent Labs    09/07/21 0000 10/02/21 0000 02/19/22 0235  LIPASE 216 23 24   No results for input(s): "AMMONIA" in the last 8760 hours. CBC: Recent Labs    09/07/21 0000  12/13/21 0000 02/19/22 0116  WBC 5.3 5.7 10.4  NEUTROABS 2,756.00 4,047.00  --   HGB 12.0 13.0 13.5  HCT 37 39 40.0  MCV  --   --  87.5  PLT 201 204 214   Cardiac Enzymes: No results for input(s): "CKTOTAL", "CKMB", "CKMBINDEX", "TROPONINI" in the last 8760 hours. BNP: Invalid input(s): "POCBNP" No results found for: "HGBA1C" Lab Results  Component Value Date   TSH 2.70 05/15/2021   Lab Results  Component Value Date   [id number] 07/12/2019   No results found for: "FOLATE" No results found for: "IRON", "TIBC", "FERRITIN"  Imaging and Procedures obtained prior to SNF admission: CT ABDOMEN PELVIS W CONTRAST  Result Date: 02/19/2022 CLINICAL DATA:  89 year old female with history of acute onset of nonlocalized abdominal pain with nausea and vomiting for 1 day. EXAM: CT ABDOMEN AND PELVIS WITH CONTRAST TECHNIQUE: Multidetector CT imaging of the abdomen and pelvis was performed using the standard protocol following bolus administration of intravenous contrast. RADIATION DOSE REDUCTION: This exam was performed according to the departmental dose-optimization program which includes automated exposure control, adjustment of the mA and/or kV according to patient size and/or use of iterative reconstruction technique. CONTRAST:  165m OMNIPAQUE IOHEXOL 300 MG/ML  SOLN COMPARISON:  CT of the abdomen and pelvis 05/12/2020. FINDINGS: Lower chest: Atherosclerotic calcifications in the descending thoracic aorta as well as the right coronary artery. Hepatobiliary: Well-defined 8.2 x 6.0 x 3.6 cm low-attenuation  collection beneath the right hemidiaphragm adjacent to the superior aspect of the right lobe of the liver predominantly next to segment 8 (axial image 12 of series 2 and coronal image 69 of series 4), larger than remote prior study 05/12/2020. Previously noted indwelling catheter has been removed since that time. Remainder of the liver is otherwise unremarkable in appearance, with no new suspicious appearing solid hepatic lesions. No intra or extrahepatic biliary ductal dilatation. Status post cholecystectomy. Pancreas: No pancreatic mass. No pancreatic ductal dilatation. No pancreatic or peripancreatic fluid collections or inflammatory changes. Spleen: Unremarkable. Adrenals/Urinary Tract: Bilateral kidneys and adrenal glands are normal in appearance. No hydroureteronephrosis. Urinary bladder is normal in appearance. Stomach/Bowel: The appearance of the stomach is normal. There is no pathologic dilatation of small bowel or colon. Numerous colonic diverticuli are noted, without focal surrounding inflammatory changes to indicate an acute diverticulitis at this time. Postoperative changes in the sigmoid colon of prior partial colectomy with what appears to be an end-to-side anastomosis. The appendix is not confidently identified and may be surgically absent. Regardless, there are no inflammatory changes noted adjacent to the cecum to suggest the presence of an acute appendicitis at this time. Vascular/Lymphatic: Atherosclerotic calcifications throughout the abdominal aorta and pelvic vasculature, without evidence of aneurysm or dissection. No lymphadenopathy noted in the abdomen or pelvis. Reproductive: Uterus and ovaries are atrophic. Coarse calcifications in the uterine body, likely reflective of a small fibroid. Other: No significant volume of ascites.  No pneumoperitoneum. Musculoskeletal: There are no aggressive appearing lytic or blastic lesions noted in the visualized portions of the skeleton. IMPRESSION: 1.  Subcapsular fluid collection adjacent to the superior aspect of the right lobe of the liver. This is unusual in appearance, but favored to be chronic given the presence of an indwelling catheter in this region on remote prior CT scan 05/12/2020. Although this may simply represent a chronic sterile fluid collection such as a seroma, the possibility of recurrent abscess is not excluded. Further clinical evaluation is recommended. 2. No other acute findings are noted  elsewhere in the abdomen or pelvis to account for the patient's symptoms. 3. Extensive colonic diverticulosis without evidence of acute diverticulitis at this time. 4. Aortic atherosclerosis, in addition to at least right coronary artery disease. 5. Additional incidental findings, as above. Electronically Signed   By: Vinnie Langton M.D.   On: 02/19/2022 05:21    Assessment/Plan 1. Agitation Staff reports less agitation recently.  Will try to taper her off of antipsychotic medicines.  Does have Depakote 3 times daily  2. Depression with anxiety Continue with Klonopin as well as Lexapro  3. 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  DC'd donepezil  4. Unsteady gait Needs walker.  History of falls but none recent    Family/ staff Communication:   Labs/tests ordered: none  Lillette Boxer. Sabra Heck, [place] [address] [place], [place] Office [phone]

## 2022-08-05 ENCOUNTER — Non-Acute Institutional Stay (SKILLED_NURSING_FACILITY): Payer: Medicare Other | Admitting: Nurse Practitioner

## 2022-08-05 ENCOUNTER — Encounter: Payer: Self-pay | Admitting: Nurse Practitioner

## 2022-08-05 DIAGNOSIS — F418 Other specified anxiety disorders: Secondary | ICD-10-CM

## 2022-08-05 DIAGNOSIS — F0390 Unspecified dementia without behavioral disturbance: Secondary | ICD-10-CM | POA: Diagnosis not present

## 2022-08-05 DIAGNOSIS — M810 Age-related osteoporosis without current pathological fracture: Secondary | ICD-10-CM | POA: Diagnosis not present

## 2022-08-05 DIAGNOSIS — N1831 Chronic kidney disease, stage 3a: Secondary | ICD-10-CM

## 2022-08-05 NOTE — Assessment & Plan Note (Signed)
Serum Ca 7.7 02/19/22,  off Ca supplement, PTH<10

## 2022-08-05 NOTE — Assessment & Plan Note (Signed)
takes Depakote and Seroquel. Vistaril hs. On Namenda. MMSE 10/30 06/27/22

## 2022-08-05 NOTE — Assessment & Plan Note (Signed)
stable, taking Omeprazole. Hgb 13.6 03/17/22

## 2022-08-05 NOTE — Assessment & Plan Note (Signed)
DEXA 06/20/22 t score -2.597, placed Fosamax and Ca, needs f/u CMP/eGFR and CBC/diff one month

## 2022-08-05 NOTE — Assessment & Plan Note (Signed)
takes Lexpro, Clonazepam, Depakote, Quetiapine,  Hydroxyzine nightly.

## 2022-08-05 NOTE — Assessment & Plan Note (Signed)
Bun/creat 13/1.23 03/07/22

## 2022-08-05 NOTE — Progress Notes (Signed)
Location:   [facility] Room Number: 106 Place of Service:  SNF (31) Provider: Arna Snipe Kinze Labo NP  Olney Monier X, NP  Patient Care Team: Gayland Nicol X, NP as PCP - General (Internal Medicine)  Extended Emergency Contact Information Primary Emergency Contact: Narda Amber Address: [address]          [place], [place] [address] [name] of [place] Home Phone: [phone] Mobile Phone: [phone] Relation: Relative Secondary Emergency Contact: West,Doug Home Phone: [phone] Mobile Phone: [phone] Relation: Relative  Code Status:  DNR Goals of care: Advanced Directive information    06/27/2022   11:00 AM  Advanced Directives  Does Patient Have a Medical Advance Directive? Yes  Type of Estate agent of [place];Living will;Out of facility DNR (pink MOST or yellow form)  Does patient want to make changes to medical advance directive? No - Patient declined  Copy of Healthcare Power of Attorney in Chart? Yes - validated most recent copy scanned in chart (See row information)     Chief Complaint  Patient presents with   Medical Management of Chronic Issues    HPI:  Pt is a 89 y.o. female seen today for medical management of chronic diseases.   Serum Ca 7.7 02/19/22,  off Ca supplement, PTH<10 DEXA 06/20/22 t score -2.597, placed Fosamax and Ca, needs f/u CMP/eGFR              Dementia/behavioral issues, takes Depakote and Seroquel. Vistaril hs. On Namenda. MMSE 10/30 06/27/22             Depression/anxiety, takes Lexpro, Clonazepam, Depakote, Quetiapine,  Hydroxyzine nightly.  CKD stable. Bun/creat 13/1.23 03/07/22 Elevated TSH 6.72 02/15/21>>2.7 05/14/21.  Gait abnormality, furniture or walker, needs reminder for safety.  GERD, stable, taking Omeprazole. Hgb 13.6 03/17/22 Seroma per surgeon consultation 02/19/22 CT abd/pelvis with fluid collection in the liver at the same site of prior abscess in 2021  Past Medical History:   Diagnosis Date   Arthritis    Dementia (HCC)    Hx of concussion    [facility]   Hyperlipidemia    Vitamin D deficiency, unspecified    Past Surgical History:  Procedure Laterality Date   CHOLECYSTECTOMY      Allergies  Allergen Reactions   Fish-Derived Products    Pravachol [Pravastatin Sodium]     Allergies as of 08/05/2022       Reactions   Fish-derived Products    Pravachol [pravastatin Sodium]         Medication List        Accurate as of August 05, 2022 11:59 PM. If you have any questions, ask your nurse or doctor.          Artificial Tears ophthalmic solution Place 3 drops into both eyes 3 (three) times daily. related to Dry eye syndrome of bilateral lacrimal glands   clonazePAM 0.5 MG tablet Commonly known as: KLONOPIN Take 1 tablet (0.5 mg total) by mouth at bedtime.   divalproex 125 MG DR tablet Commonly known as: DEPAKOTE Take 125 mg by mouth 3 (three) times daily. * DO NOT CRUSH OR CHEW* *HAZARDOUS DRUG: WEAR GLOVES*   escitalopram 10 MG tablet Commonly known as: LEXAPRO Take 10 mg by mouth daily.   hydrOXYzine 25 MG capsule Commonly known as: VISTARIL Take 25 mg by mouth at bedtime.   memantine 10 MG tablet Commonly known as: NAMENDA Take 10 mg by mouth 2 (two) times daily.   omeprazole 20  MG capsule Commonly known as: PRILOSEC Take 20 mg by mouth daily.   ondansetron 4 MG tablet Commonly known as: Zofran Take 1 tablet (4 mg total) by mouth every 8 (eight) hours as needed for nausea or vomiting.   promethazine 25 MG/ML injection Commonly known as: PHENERGAN Inject 25 mg into the vein every 6 (six) hours as needed for nausea or vomiting.   QUEtiapine 25 MG tablet Commonly known as: SEROQUEL Take 12.5 mg by mouth 2 (two) times daily.        Review of Systems  Constitutional:  Negative for appetite change, fatigue and fever.  HENT:  Positive for hearing loss. Negative for congestion and voice change.    Eyes:  Negative for visual disturbance.  Respiratory:  Negative for cough.   Cardiovascular:  Negative for leg swelling.  Gastrointestinal:  Negative for abdominal pain and constipation.  Genitourinary:  Negative for dysuria, frequency and urgency.  Musculoskeletal:  Positive for arthralgias and gait problem. Negative for myalgias.       Rails or furniture walking sometimes.   Skin:  Negative for color change.  Neurological:  Negative for speech difficulty, weakness and headaches.       Dementia  Psychiatric/Behavioral:  Positive for agitation, behavioral problems and confusion. Negative for sleep disturbance. The patient is nervous/anxious.        Less emotional, behavioral outbursts     Immunization History  Administered Date(s) Administered   Influenza-Unspecified 02/26/2019, 03/07/2020, 03/13/2021, 03/24/2022   Moderna Covid-19 Vaccine Bivalent Booster 18yrs & up 03/27/2022   Moderna Sars-Covid-2 Vaccination 06/25/2019, 07/23/2019, 04/03/2020, 10/23/2020, 10/11/2021   PFIZER(Purple Top)SARS-COV-2 Vaccination 02/12/2021   Pneumococcal Conjugate-13 12/31/2018   Pneumococcal Polysaccharide-23 02/24/2004   Pneumococcal-Unspecified 05/22/2016   Respiratory Syncytial Virus Vaccine,Recomb Aduvanted(Arexvy) 06/13/2022   Tdap 10/03/2017, 01/13/2021   Zoster Recombinat (Shingrix) 04/29/2021, 06/26/2021   Zoster, Live 06/12/2022   Pertinent  Health Maintenance Due  Topic Date Due   INFLUENZA VACCINE  Completed   DEXA SCAN  Completed      05/22/2020    7:52 PM 01/13/2021    6:42 PM 02/19/2022   12:53 AM 06/24/2022    2:30 PM 06/27/2022   11:00 AM  Fall Risk  Falls in the past year?    0 0  Was there an injury with Fall?    0 0  Fall Risk Category Calculator    0   (RETIRED) Patient Fall Risk Level High fall risk Moderate fall risk High fall risk    Patient at Risk for Falls Due to    No Fall Risks No Fall Risks  Fall risk Follow up    Falls evaluation completed Falls evaluation  completed   Functional Status Survey:    Vitals:   08/05/22 1345  BP: 118/70  Pulse: 78  Resp: 18  Temp: (!) 97.4 F (36.3 C)  SpO2: 96%  Weight: 171 lb 9.6 oz (77.8 kg)   Body mass index is 27.7 kg/m. Physical Exam Vitals and nursing note reviewed.  Constitutional:      Appearance: Normal appearance.  HENT:     Head: Normocephalic and atraumatic.     Mouth/Throat:     Mouth: Mucous membranes are moist.  Eyes:     Extraocular Movements: Extraocular movements intact.     Conjunctiva/sclera: Conjunctivae normal.     Pupils: Pupils are equal, round, and reactive to light.  Cardiovascular:     Rate and Rhythm: Normal rate and regular rhythm.     Heart sounds:  No murmur heard. Pulmonary:     Effort: Pulmonary effort is normal.     Breath sounds: No rales.  Abdominal:     General: Bowel sounds are normal.     Palpations: Abdomen is soft.     Tenderness: There is no abdominal tenderness.  Musculoskeletal:     Right lower leg: No edema.     Left lower leg: No edema.  Skin:    General: Skin is warm and dry.  Neurological:     General: No focal deficit present.     Mental Status: She is alert. Mental status is at baseline.     Gait: Gait abnormal.     Comments: Oriented to person  Psychiatric:     Comments: Confused, pleasant, conversing.      Labs reviewed: Recent Labs    10/22/21 0000 12/13/21 0000 02/19/22 0235  NA 134* 135* 137  K 4.3 4.3  4.3 3.9  CL 96* 98*  98* 106  CO2 31* 25*  25* 24  GLUCOSE  --   --  117*  BUN 13 14 14   CREATININE 1.2* 1.3*  1.3* 0.86  CALCIUM 8.8 9.2  9.2 7.7*   Recent Labs    10/22/21 0000 12/13/21 0000 02/19/22 0235  AST 11* 12* 18  ALT 7 6* 9  ALKPHOS 80 80 62  BILITOT  --   --  0.7  PROT  --   --  6.2*  ALBUMIN 3.7 4.0 2.8*   Recent Labs    09/07/21 0000 12/13/21 0000 02/19/22 0116  WBC 5.3 5.7 10.4  NEUTROABS 2,756.00 4,047.00  --   HGB 12.0 13.0 13.5  HCT 37 39 40.0  MCV  --   --  87.5  PLT 201  204 214   Lab Results  Component Value Date   TSH 2.70 05/15/2021   No results found for: "HGBA1C" No results found for: "CHOL", "HDL", "LDLCALC", "LDLDIRECT", "TRIG", "CHOLHDL"  Significant Diagnostic Results in last 30 days:  No results found.  Assessment/Plan  Osteoporosis DEXA 06/20/22 t score -2.597, placed Fosamax and Ca, needs f/u CMP/eGFR and CBC/diff one month  Hypercalcemia Serum Ca 7.7 02/19/22,  off Ca supplement, PTH<10  Senile dementia (HCC) takes Depakote and Seroquel. Vistaril hs. On Namenda. MMSE 10/30 06/27/22  Depression with anxiety  takes Lexpro, Clonazepam, Depakote, Quetiapine,  Hydroxyzine nightly.   CKD (chronic kidney disease) stage 3, GFR 30-59 ml/min (HCC) Bun/creat 13/1.23 03/07/22  GERD (gastroesophageal reflux disease)  stable, taking Omeprazole. Hgb 13.6 03/17/22   Family/ staff Communication: plan of care reviewed with the patient and charge nurse.   Labs/tests ordered: none  Time spend 35 minutes.

## 2022-09-02 LAB — HEPATIC FUNCTION PANEL
ALT: 5 U/L — AB (ref 7–35)
AST: 11 — AB (ref 13–35)
Alkaline Phosphatase: 71 (ref 25–125)
Bilirubin, Total: 0.6

## 2022-09-02 LAB — BASIC METABOLIC PANEL
BUN: 14 (ref 4–21)
CO2: 31 — AB (ref 13–22)
Chloride: 98 — AB (ref 99–108)
Creatinine: 1.1 (ref 0.5–1.1)
Glucose: 85
Potassium: 4.1 mEq/L (ref 3.5–5.1)
Sodium: 135 — AB (ref 137–147)

## 2022-09-02 LAB — COMPREHENSIVE METABOLIC PANEL
Albumin: 3.7 (ref 3.5–5.0)
Calcium: 8.8 (ref 8.7–10.7)
Globulin: 3.4
eGFR: 47

## 2022-09-02 LAB — CBC AND DIFFERENTIAL
HCT: 40 (ref 36–46)
Hemoglobin: 13.1 (ref 12.0–16.0)
Neutrophils Absolute: 4108
Platelets: 232 10*3/uL (ref 150–400)
WBC: 6.3

## 2022-09-02 LAB — CBC: RBC: 4.64 (ref 3.87–5.11)

## 2022-09-03 ENCOUNTER — Other Ambulatory Visit: Payer: Self-pay | Admitting: Adult Health

## 2022-09-03 DIAGNOSIS — F418 Other specified anxiety disorders: Secondary | ICD-10-CM

## 2022-09-03 DIAGNOSIS — F01C18 Vascular dementia, severe, with other behavioral disturbance: Secondary | ICD-10-CM

## 2022-09-03 MED ORDER — CLONAZEPAM 0.5 MG PO TABS: 0.5000 mg | ORAL_TABLET | Freq: Every day | ORAL | 0 refills | Status: DC

## 2022-09-08 ENCOUNTER — Encounter: Payer: Self-pay | Admitting: Nurse Practitioner

## 2022-09-08 ENCOUNTER — Non-Acute Institutional Stay (SKILLED_NURSING_FACILITY): Payer: Medicare Other | Admitting: Nurse Practitioner

## 2022-09-08 DIAGNOSIS — F0390 Unspecified dementia without behavioral disturbance: Secondary | ICD-10-CM

## 2022-09-08 DIAGNOSIS — R2681 Unsteadiness on feet: Secondary | ICD-10-CM

## 2022-09-08 DIAGNOSIS — K219 Gastro-esophageal reflux disease without esophagitis: Secondary | ICD-10-CM

## 2022-09-08 DIAGNOSIS — F418 Other specified anxiety disorders: Secondary | ICD-10-CM

## 2022-09-08 DIAGNOSIS — N1831 Chronic kidney disease, stage 3a: Secondary | ICD-10-CM

## 2022-09-08 NOTE — Assessment & Plan Note (Signed)
furniture or walker, needs reminder for safety.  ?

## 2022-09-08 NOTE — Progress Notes (Signed)
Location:  [facility] Room Number: 103-A Place of Service:  SNF [phone]) Provider:  Maloree Uplinger Dicie Beam    Patient Care Team: Debroah Shuttleworth X, NP as PCP - General (Internal Medicine)  Extended Emergency Contact Information Primary Emergency Contact: Narda Amber Address: [address]          [place], [place] [address] [name] of [place] Home Phone: [phone] Mobile Phone: [phone] Relation: Relative Secondary Emergency Contact: West,Doug Home Phone: [phone] Mobile Phone: [phone] Relation: Relative  Code Status:  DNR Goals of care: Advanced Directive information    09/08/2022   10:16 AM  Advanced Directives  Does Patient Have a Medical Advance Directive? Yes  Type of Estate agent of [place];Living will;Out of facility DNR (pink MOST or yellow form)  Does patient want to make changes to medical advance directive? No - Patient declined  Copy of Healthcare Power of Attorney in Chart? Yes - validated most recent copy scanned in chart (See row information)     Chief Complaint  Patient presents with  . Routine    HPI:  Pt is a 89 y.o. female seen today for medical management of chronic diseases.    Serum Ca 8.8 09/02/22   DEXA 06/20/22 t score -2.597, on Ca, Vit D, Alendronate             Dementia/behavioral issues, takes Depakote, off Seroquel. Vistaril hs. On Namenda. MMSE 10/30 06/27/22             Depression/anxiety, takes Lexpro, Clonazepam, Depakote, off Quetiapine, on Hydroxyzine nightly.  CKD stable. Bun/creat 14/1.1 09/02/22 Elevated TSH 6.72 02/15/21>>2.7 05/14/21.  Gait abnormality, furniture or walker, needs reminder for safety.  GERD, stable, taking Omeprazole. Hgb 13.1 09/02/22 Seroma per surgeon consultation 02/19/22 CT abd/pelvis with fluid collection in the liver at the same site of prior abscess in 2021   Past Medical History:  Diagnosis Date  . Arthritis   . Dementia   . Hx of concussion     [facility]  . Hyperlipidemia   . Vitamin D deficiency, unspecified    Past Surgical History:  Procedure Laterality Date  . CHOLECYSTECTOMY      Allergies  Allergen Reactions  . Fish-Derived Products   . Pravachol [Pravastatin Sodium]     Outpatient Encounter Medications as of 09/08/2022  Medication Sig  . alendronate (FOSAMAX) 70 MG tablet Take 70 mg by mouth once a week. Take with a full glass of water on an empty stomach.  . Artificial Tears ophthalmic solution Place 3 drops into both eyes 3 (three) times daily. related to Dry eye syndrome of bilateral lacrimal glands  . Calcium Carb-Cholecalciferol (CALTRATE 600+D3) 600-20 MG-MCG TABS Take 1 tablet by mouth in the morning and at bedtime.  . clonazePAM (KLONOPIN) 0.5 MG tablet Take 1 tablet (0.5 mg total) by mouth at bedtime.  . divalproex (DEPAKOTE) 125 MG DR tablet Take 125 mg by mouth 3 (three) times daily. * DO NOT CRUSH OR CHEW* *HAZARDOUS DRUG: WEAR GLOVES*  . escitalopram (LEXAPRO) 10 MG tablet Take 10 mg by mouth daily.  . hydrOXYzine (VISTARIL) 25 MG capsule Take 25 mg by mouth at bedtime.  . memantine (NAMENDA) 10 MG tablet Take 10 mg by mouth 2 (two) times daily.  Marland Kitchen omeprazole (PRILOSEC) 20 MG capsule Take 20 mg by mouth daily.  . ondansetron (ZOFRAN) 4 MG tablet Take 1 tablet (4 mg total) by mouth every 8 (eight) hours as needed for nausea or  vomiting. (Patient not taking: Reported on 09/08/2022)  . promethazine (PHENERGAN) 25 MG/ML injection Inject 25 mg into the vein every 6 (six) hours as needed for nausea or vomiting.  . [DISCONTINUED] QUEtiapine (SEROQUEL) 25 MG tablet Take 12.5 mg by mouth 2 (two) times daily.   No facility-administered encounter medications on file as of 09/08/2022.    Review of Systems  Constitutional:  Negative for appetite change, fatigue and fever.  HENT:  Positive for hearing loss. Negative for congestion and voice change.   Eyes:  Negative for visual disturbance.   Respiratory:  Negative for cough.   Cardiovascular:  Negative for leg swelling.  Gastrointestinal:  Negative for abdominal pain and constipation.  Genitourinary:  Negative for dysuria, frequency and urgency.  Musculoskeletal:  Positive for arthralgias and gait problem. Negative for myalgias.       Rails or furniture walking sometimes.   Skin:  Negative for color change.  Neurological:  Negative for speech difficulty, weakness and headaches.       Dementia  Psychiatric/Behavioral:  Positive for agitation, behavioral problems and confusion. Negative for sleep disturbance. The patient is nervous/anxious.        Less emotional, behavioral outbursts     Immunization History  Administered Date(s) Administered  . Influenza-Unspecified 02/26/2019, 03/07/2020, 03/13/2021, 03/24/2022  . Moderna Covid-19 Vaccine Bivalent Booster 63yrs & up 03/27/2022  . Moderna Sars-Covid-2 Vaccination 06/25/2019, 07/23/2019, 04/03/2020, 10/23/2020, 10/11/2021  . PFIZER(Purple Top)SARS-COV-2 Vaccination 02/12/2021  . Pneumococcal Conjugate-13 12/31/2018  . Pneumococcal Polysaccharide-23 02/24/2004  . Pneumococcal-Unspecified 05/22/2016  . Respiratory Syncytial Virus Vaccine,Recomb Aduvanted(Arexvy) 06/13/2022  . Tdap 10/03/2017, 01/13/2021  . Zoster Recombinat (Shingrix) 04/29/2021, 06/26/2021  . Zoster, Live 06/12/2022   Pertinent  Health Maintenance Due  Topic Date Due  . INFLUENZA VACCINE  12/25/2022  . DEXA SCAN  Completed      05/22/2020    7:52 PM 01/13/2021    6:42 PM 02/19/2022   12:53 AM 06/24/2022    2:30 PM 06/27/2022   11:00 AM  Fall Risk  Falls in the past year?    0 0  Was there an injury with Fall?    0 0  Fall Risk Category Calculator    0   (RETIRED) Patient Fall Risk Level High fall risk Moderate fall risk High fall risk    Patient at Risk for Falls Due to    No Fall Risks No Fall Risks  Fall risk Follow up    Falls evaluation completed Falls evaluation completed   Functional Status  Survey:    Vitals:   09/08/22 0956  BP: 126/70  Pulse: 76  Resp: 18  Temp: 98.1 F (36.7 C)  SpO2: 97%  Weight: 167 lb 6.4 oz (75.9 kg)  Height:  (1.676 m)   Body mass index is 27.02 kg/m. Physical Exam Vitals and nursing note reviewed.  Constitutional:      Appearance: Normal appearance.  HENT:     Head: Normocephalic and atraumatic.     Mouth/Throat:     Mouth: Mucous membranes are moist.  Eyes:     Extraocular Movements: Extraocular movements intact.     Conjunctiva/sclera: Conjunctivae normal.     Pupils: Pupils are equal, round, and reactive to light.  Cardiovascular:     Rate and Rhythm: Normal rate and regular rhythm.     Heart sounds: No murmur heard. Pulmonary:     Effort: Pulmonary effort is normal.     Breath sounds: No rales.  Abdominal:  General: Bowel sounds are normal.     Palpations: Abdomen is soft.     Tenderness: There is no abdominal tenderness.  Musculoskeletal:     Right lower leg: No edema.     Left lower leg: No edema.  Skin:    General: Skin is warm and dry.  Neurological:     General: No focal deficit present.     Mental Status: She is alert. Mental status is at baseline.     Gait: Gait abnormal.     Comments: Oriented to person  Psychiatric:     Comments: Confused, pleasant, conversing.     Labs reviewed: Recent Labs    12/13/21 0000 02/19/22 0235 09/02/22 0000  NA 135* 137 135*  K 4.3  4.3 3.9 4.1  CL 98*  98* 106 98*  CO2 25*  25* 24 31*  GLUCOSE  --  117*  --   BUN CREATININE 1.3*  1.3* 0.86 1.1  CALCIUM 9.2  9.2 7.7* 8.8   Recent Labs    12/13/21 0000 02/19/22 0235 09/02/22 0000  AST 12* 18 11*  ALT 6* 9 5*  ALKPHOS 80 62 71  BILITOT  --  0.7  --   PROT  --  6.2*  --   ALBUMIN 4.0 2.8* 3.7   Recent Labs    12/13/21 0000 02/19/22 0116 09/02/22 0000  WBC 5.7 10.4 6.3  NEUTROABS 4,047.00  --  4,108.00  HGB 13.0 13.5 13.1  HCT 39 40.0 40  MCV  --  87.5  --   PLT 204 214 232    Lab Results  Component Value Date   TSH 2.70 05/15/2021   No results found for: "HGBA1C" No results found for: "CHOL", "HDL", "LDLCALC", "LDLDIRECT", "TRIG", "CHOLHDL"  Significant Diagnostic Results in last 30 days:  No results found.  Assessment/Plan No problem-specific Assessment & Plan notes found for this encounter.     Family/ staff Communication: plan of care reviewed with the patient and charge nurse.   Labs/tests ordered:  none  Time spend 35 minutes.

## 2022-09-08 NOTE — Assessment & Plan Note (Signed)
Her mood is stable,  takes Lexpro, Clonazepam, Depakote, off Quetiapine, on Hydroxyzine nightly.

## 2022-09-08 NOTE — Assessment & Plan Note (Signed)
Serum Ca 8.8 09/02/22   DEXA 06/20/22 t score -2.597, on Ca, Vit D, Alendronate

## 2022-09-08 NOTE — Assessment & Plan Note (Signed)
takes Depakote, off Seroquel. Vistaril hs. On Namenda. MMSE 10/30 06/27/22

## 2022-09-08 NOTE — Assessment & Plan Note (Signed)
Bun/creat 14/1.1 09/02/22

## 2022-09-08 NOTE — Assessment & Plan Note (Signed)
stable, taking Omeprazole. Hgb 13.1 09/02/22

## 2022-09-09 ENCOUNTER — Encounter: Payer: Self-pay | Admitting: Nurse Practitioner

## 2022-10-10 ENCOUNTER — Other Ambulatory Visit: Payer: Self-pay | Admitting: Orthopedic Surgery

## 2022-10-10 DIAGNOSIS — F418 Other specified anxiety disorders: Secondary | ICD-10-CM

## 2022-10-10 DIAGNOSIS — F01C18 Vascular dementia, severe, with other behavioral disturbance: Secondary | ICD-10-CM

## 2022-10-10 MED ORDER — CLONAZEPAM 0.5 MG PO TABS: 0.5000 mg | ORAL_TABLET | Freq: Every day | ORAL | 0 refills | Status: DC

## 2022-10-28 ENCOUNTER — Encounter: Payer: Self-pay | Admitting: Nurse Practitioner

## 2022-10-28 ENCOUNTER — Non-Acute Institutional Stay (SKILLED_NURSING_FACILITY): Payer: Medicare Other | Admitting: Nurse Practitioner

## 2022-10-28 DIAGNOSIS — M810 Age-related osteoporosis without current pathological fracture: Secondary | ICD-10-CM

## 2022-10-28 DIAGNOSIS — K219 Gastro-esophageal reflux disease without esophagitis: Secondary | ICD-10-CM | POA: Diagnosis not present

## 2022-10-28 DIAGNOSIS — N1831 Chronic kidney disease, stage 3a: Secondary | ICD-10-CM | POA: Diagnosis not present

## 2022-10-28 DIAGNOSIS — F0390 Unspecified dementia without behavioral disturbance: Secondary | ICD-10-CM

## 2022-10-28 DIAGNOSIS — F418 Other specified anxiety disorders: Secondary | ICD-10-CM | POA: Diagnosis not present

## 2022-10-28 NOTE — Assessment & Plan Note (Signed)
Her mood is stable,  takes Lexpro, Clonazepam, Depakote, off Quetiapine, on Hydroxyzine nightly.  

## 2022-10-28 NOTE — Assessment & Plan Note (Signed)
Bun/creat 14/1.1 09/02/22 

## 2022-10-28 NOTE — Progress Notes (Signed)
Location:  [facility] Room Number: NO/55/A Place of Service:  SNF (31) Provider:  Liliahna Cudd X, NP  Patient Care Team: Ellinore Merced X, NP as PCP - General (Internal Medicine)  Extended Emergency Contact Information Primary Emergency Contact: Narda Amber Address: [address]          [place], [place] [address] [name] of [place] Home Phone: [phone] Mobile Phone: [phone] Relation: Relative Secondary Emergency Contact: West,Doug Home Phone: [phone] Mobile Phone: [phone] Relation: Relative  Code Status:  DNR Goals of care: Advanced Directive information    10/28/2022    9:49 AM  Advanced Directives  Does Patient Have a Medical Advance Directive? Yes  Type of Estate agent of [place];Living will;Out of facility DNR (pink MOST or yellow form)  Does patient want to make changes to medical advance directive? No - Patient declined  Copy of Healthcare Power of Attorney in Chart? Yes - validated most recent copy scanned in chart (See row information)     Chief Complaint  Patient presents with   Medical Management of Chronic Issues    Patient is here for a follow up for chronic conditions Patient is due for updated covid booster    HPI:  Pt is a 89 y.o. female seen today for medical management of chronic diseases.    Serum Ca 8.8 09/02/22   DEXA 06/20/22 t score -2.597, on Ca, Vit D, Alendronate             Dementia/behavioral issues, takes Depakote, off Seroquel. Vistaril hs. On Namenda. MMSE 10/30 06/27/22             Depression/anxiety, takes Lexpro, Clonazepam, Depakote, off Quetiapine, on Hydroxyzine nightly.  CKD stable. Bun/creat 14/1.1 09/02/22 Elevated TSH 6.72 02/15/21>>2.7 05/14/21.  Gait abnormality, furniture or walker, needs reminder for safety.  GERD, stable, taking Omeprazole. Hgb 13.1 09/02/22 Seroma per surgeon consultation 02/19/22 CT abd/pelvis with fluid collection in the liver at the same  site of prior abscess in 2021     Past Medical History:  Diagnosis Date   Arthritis    Dementia (HCC)    Hx of concussion    [facility]   Hyperlipidemia    Vitamin D deficiency, unspecified    Past Surgical History:  Procedure Laterality Date   CHOLECYSTECTOMY      Allergies  Allergen Reactions   Fish-Derived Products    Pravachol [Pravastatin Sodium]     Outpatient Encounter Medications as of 10/28/2022  Medication Sig   alendronate (FOSAMAX) 70 MG tablet Take 70 mg by mouth once a week. Take with a full glass of water on an empty stomach.   Artificial Tears ophthalmic solution Place 3 drops into both eyes 3 (three) times daily. related to Dry eye syndrome of bilateral lacrimal glands   Calcium Carb-Cholecalciferol (CALTRATE 600+D3) 600-20 MG-MCG TABS Take 1 tablet by mouth in the morning and at bedtime.   clonazePAM (KLONOPIN) 0.5 MG tablet Take 1 tablet (0.5 mg total) by mouth at bedtime.   divalproex (DEPAKOTE) 125 MG DR tablet Take 125 mg by mouth 3 (three) times daily. * DO NOT CRUSH OR CHEW* *HAZARDOUS DRUG: WEAR GLOVES*   escitalopram (LEXAPRO) 10 MG tablet Take 10 mg by mouth daily.   hydrOXYzine (VISTARIL) 25 MG capsule Take 25 mg by mouth at bedtime.   memantine (NAMENDA) 10 MG tablet Take 10 mg by mouth 2 (two) times daily.   omeprazole (PRILOSEC) 20 MG capsule Take 20 mg  by mouth daily.   [DISCONTINUED] ondansetron (ZOFRAN) 4 MG tablet Take 1 tablet (4 mg total) by mouth every 8 (eight) hours as needed for nausea or vomiting. (Patient not taking: Reported on 09/08/2022)   [DISCONTINUED] promethazine (PHENERGAN) 25 MG/ML injection Inject 25 mg into the vein every 6 (six) hours as needed for nausea or vomiting.   No facility-administered encounter medications on file as of 10/28/2022.    Review of Systems  Constitutional:  Negative for appetite change, fatigue and fever.  HENT:  Positive for hearing loss. Negative for congestion and voice change.    Eyes:  Negative for visual disturbance.  Respiratory:  Negative for cough.   Cardiovascular:  Negative for leg swelling.  Gastrointestinal:  Negative for abdominal pain and constipation.  Genitourinary:  Negative for dysuria, frequency and urgency.  Musculoskeletal:  Positive for arthralgias and gait problem. Negative for myalgias.       Rails or furniture walking sometimes.   Skin:  Negative for color change.  Neurological:  Negative for speech difficulty, weakness and headaches.       Dementia  Psychiatric/Behavioral:  Positive for confusion. Negative for agitation, behavioral problems and sleep disturbance. The patient is not nervous/anxious.        Less emotional, behavioral outbursts     Immunization History  Administered Date(s) Administered   Influenza-Unspecified 02/26/2019, 03/07/2020, 03/13/2021, 03/24/2022   Moderna Covid-19 Vaccine Bivalent Booster 86yrs & up 03/27/2022   Moderna Sars-Covid-2 Vaccination 06/25/2019, 07/23/2019, 04/03/2020, 10/23/2020, 10/11/2021   PFIZER(Purple Top)SARS-COV-2 Vaccination 02/12/2021   Pneumococcal Conjugate-13 12/31/2018   Pneumococcal Polysaccharide-23 02/24/2004   Pneumococcal-Unspecified 05/22/2016   Respiratory Syncytial Virus Vaccine,Recomb Aduvanted(Arexvy) 06/13/2022   Tdap 10/03/2017, 01/13/2021   Zoster Recombinat (Shingrix) 04/29/2021, 06/26/2021   Zoster, Live 06/12/2022   Pertinent  Health Maintenance Due  Topic Date Due   INFLUENZA VACCINE  12/25/2022   DEXA SCAN  Completed      01/13/2021    6:42 PM 02/19/2022   12:53 AM 06/24/2022    2:30 PM 06/27/2022   11:00 AM 10/28/2022    9:50 AM  Fall Risk  Falls in the past year?   0 0 0  Was there an injury with Fall?   0 0 0  Fall Risk Category Calculator   0  0  (RETIRED) Patient Fall Risk Level Moderate fall risk High fall risk     Patient at Risk for Falls Due to   No Fall Risks No Fall Risks History of fall(s)  Fall risk Follow up   Falls evaluation completed Falls  evaluation completed Falls evaluation completed   Functional Status Survey:    Vitals:   10/28/22 0957  BP: 138/80  Pulse: 73  Resp: 18  Temp: 97.7 F (36.5 C)  SpO2: 97%  Weight: 167 lb 12.8 oz (76.1 kg)  Height: 5\' 6"  (1.676 m)   Body mass index is 27.08 kg/m. Physical Exam Vitals and nursing note reviewed.  Constitutional:      Appearance: Normal appearance.  HENT:     Head: Normocephalic and atraumatic.     Mouth/Throat:     Mouth: Mucous membranes are moist.  Eyes:     Extraocular Movements: Extraocular movements intact.     Conjunctiva/sclera: Conjunctivae normal.     Pupils: Pupils are equal, round, and reactive to light.  Cardiovascular:     Rate and Rhythm: Normal rate and regular rhythm.     Heart sounds: No murmur heard. Pulmonary:     Effort: Pulmonary effort  is normal.     Breath sounds: No rales.  Abdominal:     General: Bowel sounds are normal.     Palpations: Abdomen is soft.     Tenderness: There is no abdominal tenderness.  Musculoskeletal:     Right lower leg: No edema.     Left lower leg: No edema.  Skin:    General: Skin is warm and dry.  Neurological:     General: No focal deficit present.     Mental Status: She is alert. Mental status is at baseline.     Gait: Gait abnormal.     Comments: Oriented to person  Psychiatric:     Comments: Confused, pleasant, conversing.      Labs reviewed: Recent Labs    12/13/21 0000 02/19/22 0235 09/02/22 0000  NA 135* 137 135*  K 4.3  4.3 3.9 4.1  CL 98*  98* 106 98*  CO2 25*  25* 24 31*  GLUCOSE  --  117*  --   BUN 14 14 14   CREATININE 1.3*  1.3* 0.86 1.1  CALCIUM 9.2  9.2 7.7* 8.8   Recent Labs    12/13/21 0000 02/19/22 0235 09/02/22 0000  AST 12* 18 11*  ALT 6* 9 5*  ALKPHOS 80 62 71  BILITOT  --  0.7  --   PROT  --  6.2*  --   ALBUMIN 4.0 2.8* 3.7   Recent Labs    12/13/21 0000 02/19/22 0116 09/02/22 0000  WBC 5.7 10.4 6.3  NEUTROABS 4,047.00  --  4,108.00  HGB  13.0 13.5 13.1  HCT 39 40.0 40  MCV  --  87.5  --   PLT 204 214 232   Lab Results  Component Value Date   TSH 2.70 05/15/2021   No results found for: "HGBA1C" No results found for: "CHOL", "HDL", "LDLCALC", "LDLDIRECT", "TRIG", "CHOLHDL"  Significant Diagnostic Results in last 30 days:  No results found.  Assessment/Plan GERD (gastroesophageal reflux disease) stable, taking Omeprazole. Hgb 13.1 09/02/22  CKD (chronic kidney disease) stage 3, GFR 30-59 ml/min (HCC) Bun/creat 14/1.1 09/02/22  Depression with anxiety Her mood is stable,  takes Lexpro, Clonazepam, Depakote, off Quetiapine, on Hydroxyzine nightly.   Senile dementia (HCC) No behavioral issues, moved out of memory care unit, takes Depakote, off Seroquel. Vistaril hs. On Namenda. MMSE 10/30 06/27/22  Osteoporosis Serum Ca 8.8 09/02/22   DEXA 06/20/22 t score -2.597, on Ca, Vit D, Alendronate     Family/ staff Communication: plan of care reviewed with the patient and charge nurse.   Labs/tests ordered:  none  Time spend 25 minutes.

## 2022-10-28 NOTE — Assessment & Plan Note (Signed)
Serum Ca 8.8 09/02/22   DEXA 06/20/22 t score -2.597, on Ca, Vit D, Alendronate 

## 2022-10-28 NOTE — Assessment & Plan Note (Signed)
No behavioral issues, moved out of memory care unit, takes Depakote, off Seroquel. Vistaril hs. On Namenda. MMSE 10/30 06/27/22

## 2022-10-28 NOTE — Assessment & Plan Note (Signed)
stable, taking Omeprazole. Hgb 13.1 09/02/22 

## 2022-11-06 ENCOUNTER — Non-Acute Institutional Stay (SKILLED_NURSING_FACILITY): Payer: Medicare Other | Admitting: Nurse Practitioner

## 2022-11-06 ENCOUNTER — Encounter: Payer: Self-pay | Admitting: Nurse Practitioner

## 2022-11-06 DIAGNOSIS — F418 Other specified anxiety disorders: Secondary | ICD-10-CM | POA: Diagnosis not present

## 2022-11-06 DIAGNOSIS — F0390 Unspecified dementia without behavioral disturbance: Secondary | ICD-10-CM | POA: Diagnosis not present

## 2022-11-06 DIAGNOSIS — N1831 Chronic kidney disease, stage 3a: Secondary | ICD-10-CM

## 2022-11-06 DIAGNOSIS — K219 Gastro-esophageal reflux disease without esophagitis: Secondary | ICD-10-CM | POA: Diagnosis not present

## 2022-11-06 NOTE — Assessment & Plan Note (Signed)
stable. Bun/creat 14/1.1 09/02/22

## 2022-11-06 NOTE — Assessment & Plan Note (Signed)
stable, taking Omeprazole. Hgb 13.1 09/02/22 

## 2022-11-06 NOTE — Progress Notes (Signed)
Location:  [facility] Room Number: NO/55/A Place of Service:  SNF (31) Provider: Tricia Oaxaca X, NP  Patient Care Team: Avamarie Crossley X, NP as PCP - General (Internal Medicine)  Extended Emergency Contact Information Primary Emergency Contact: Narda Amber Address: [address]          [place], [place] [address] [name] of [place] Home Phone: [phone] Mobile Phone: [phone] Relation: Relative Secondary Emergency Contact: West,Doug Home Phone: [phone] Mobile Phone: [phone] Relation: Relative  Code Status:  DNR Goals of care: Advanced Directive information    10/28/2022    9:49 AM  Advanced Directives  Does Patient Have a Medical Advance Directive? Yes  Type of Estate agent of [place];Living will;Out of facility DNR (pink MOST or yellow form)  Does patient want to make changes to medical advance directive? No - Patient declined  Copy of Healthcare Power of Attorney in Chart? Yes - validated most recent copy scanned in chart (See row information)     Chief Complaint  Patient presents with   Acute Visit    Patient is being seen for HPOA desires medication GDR    HPI:  Pt is a 89 y.o. female seen today for an acute visit for the patient's HPOA desires GDR. Serum Ca 8.8 09/02/22   DEXA 06/20/22 t score -2.597, on Ca, Vit D, Alendronate             Dementia/behavioral issues, takes Depakote, off Seroquel. Vistaril hs prn On Namenda. MMSE 10/30 06/27/22             Depression/anxiety, takes Lexpro, Clonazepam(decreased to 0.25mg /0.5mg  hs 11/06/22), Depakote, off Quetiapine, on Hydroxyzine hs prn  CKD stable. Bun/creat 14/1.1 09/02/22 Elevated TSH 6.72 02/15/21>>2.7 05/14/21.  Gait abnormality, furniture or walker, needs reminder for safety.  GERD, stable, taking Omeprazole. Hgb 13.1 09/02/22 Seroma per surgeon consultation 02/19/22 CT abd/pelvis with fluid collection in the liver at the same site of prior abscess in  2021       Past Medical History:  Diagnosis Date   Arthritis    Dementia (HCC)    Hx of concussion    [facility]   Hyperlipidemia    Vitamin D deficiency, unspecified    Past Surgical History:  Procedure Laterality Date   CHOLECYSTECTOMY      Allergies  Allergen Reactions   Fish-Derived Products    Pravachol [Pravastatin Sodium]     Outpatient Encounter Medications as of 11/06/2022  Medication Sig   alendronate (FOSAMAX) 70 MG tablet Take 70 mg by mouth once a week. Take with a full glass of water on an empty stomach.   Artificial Tears ophthalmic solution Place 3 drops into both eyes 3 (three) times daily. related to Dry eye syndrome of bilateral lacrimal glands   Calcium Carb-Cholecalciferol (CALTRATE 600+D3) 600-20 MG-MCG TABS Take 1 tablet by mouth in the morning and at bedtime.   clonazePAM (KLONOPIN) 0.5 MG tablet Take 1 tablet (0.5 mg total) by mouth at bedtime.   divalproex (DEPAKOTE) 125 MG DR tablet Take 125 mg by mouth 3 (three) times daily. * DO NOT CRUSH OR CHEW* *HAZARDOUS DRUG: WEAR GLOVES*   escitalopram (LEXAPRO) 10 MG tablet Take 10 mg by mouth daily.   hydrOXYzine (VISTARIL) 25 MG capsule Take 25 mg by mouth at bedtime.   memantine (NAMENDA) 10 MG tablet Take 10 mg by mouth 2 (two) times daily.   omeprazole (PRILOSEC) 20 MG capsule Take 20 mg by mouth daily.  No facility-administered encounter medications on file as of 11/06/2022.    Review of Systems  Constitutional:  Negative for appetite change, fatigue and fever.       Sleepy  HENT:  Positive for hearing loss. Negative for congestion and voice change.   Eyes:  Negative for visual disturbance.  Respiratory:  Negative for cough.   Cardiovascular:  Negative for leg swelling.  Gastrointestinal:  Negative for abdominal pain and constipation.  Genitourinary:  Negative for dysuria, frequency and urgency.  Musculoskeletal:  Positive for arthralgias and gait problem. Negative for  myalgias.       Rails or furniture walking sometimes.   Skin:  Negative for color change.  Neurological:  Negative for speech difficulty, weakness and headaches.       Dementia  Psychiatric/Behavioral:  Positive for confusion. Negative for agitation, behavioral problems and sleep disturbance. The patient is not nervous/anxious.        Less emotional, behavioral outbursts     Immunization History  Administered Date(s) Administered   Influenza-Unspecified 02/26/2019, 03/07/2020, 03/13/2021, 03/24/2022   Moderna Covid-19 Vaccine Bivalent Booster 19yrs & up 03/27/2022   Moderna Sars-Covid-2 Vaccination 06/25/2019, 07/23/2019, 04/03/2020, 10/23/2020, 10/11/2021   PFIZER(Purple Top)SARS-COV-2 Vaccination 02/12/2021   Pneumococcal Conjugate-13 12/31/2018   Pneumococcal Polysaccharide-23 02/24/2004   Pneumococcal-Unspecified 05/22/2016   Respiratory Syncytial Virus Vaccine,Recomb Aduvanted(Arexvy) 06/13/2022   Tdap 10/03/2017, 01/13/2021   Zoster Recombinat (Shingrix) 04/29/2021, 06/26/2021   Zoster, Live 06/12/2022   Pertinent  Health Maintenance Due  Topic Date Due   INFLUENZA VACCINE  12/25/2022   DEXA SCAN  Completed      01/13/2021    6:42 PM 02/19/2022   12:53 AM 06/24/2022    2:30 PM 06/27/2022   11:00 AM 10/28/2022    9:50 AM  Fall Risk  Falls in the past year?   0 0 0  Was there an injury with Fall?   0 0 0  Fall Risk Category Calculator   0  0  (RETIRED) Patient Fall Risk Level Moderate fall risk High fall risk     Patient at Risk for Falls Due to   No Fall Risks No Fall Risks History of fall(s)  Fall risk Follow up   Falls evaluation completed Falls evaluation completed Falls evaluation completed   Functional Status Survey:    Vitals:   11/06/22 1132  BP: 122/67  Pulse: 84  Resp: 18  Temp: 97.7 F (36.5 C)  SpO2: 97%  Weight: 167 lb 12.8 oz (76.1 kg)  Height: 5\' 6"  (1.676 m)   Body mass index is 27.08 kg/m. Physical Exam Vitals and nursing note reviewed.   Constitutional:      Appearance: Normal appearance.     Comments: Sleepy, able to be aroused, but falls back to sleep during my examination today.   HENT:     Head: Normocephalic and atraumatic.     Mouth/Throat:     Mouth: Mucous membranes are moist.  Eyes:     Extraocular Movements: Extraocular movements intact.     Conjunctiva/sclera: Conjunctivae normal.     Pupils: Pupils are equal, round, and reactive to light.  Cardiovascular:     Rate and Rhythm: Normal rate and regular rhythm.     Heart sounds: No murmur heard. Pulmonary:     Effort: Pulmonary effort is normal.     Breath sounds: No rales.  Abdominal:     General: Bowel sounds are normal.     Palpations: Abdomen is soft.     Tenderness:  There is no abdominal tenderness.  Musculoskeletal:     Right lower leg: No edema.     Left lower leg: No edema.  Skin:    General: Skin is warm and dry.  Neurological:     General: No focal deficit present.     Mental Status: She is alert. Mental status is at baseline.     Gait: Gait abnormal.     Comments: Oriented to person  Psychiatric:     Comments: Confused, pleasant, conversing.      Labs reviewed: Recent Labs    12/13/21 0000 02/19/22 0235 09/02/22 0000  NA 135* 137 135*  K 4.3  4.3 3.9 4.1  CL 98*  98* 106 98*  CO2 25*  25* 24 31*  GLUCOSE  --  117*  --   BUN 14 14 14   CREATININE 1.3*  1.3* 0.86 1.1  CALCIUM 9.2  9.2 7.7* 8.8   Recent Labs    12/13/21 0000 02/19/22 0235 09/02/22 0000  AST 12* 18 11*  ALT 6* 9 5*  ALKPHOS 80 62 71  BILITOT  --  0.7  --   PROT  --  6.2*  --   ALBUMIN 4.0 2.8* 3.7   Recent Labs    12/13/21 0000 02/19/22 0116 09/02/22 0000  WBC 5.7 10.4 6.3  NEUTROABS 4,047.00  --  4,108.00  HGB 13.0 13.5 13.1  HCT 39 40.0 40  MCV  --  87.5  --   PLT 204 214 232   Lab Results  Component Value Date   TSH 2.70 05/15/2021   No results found for: "HGBA1C" No results found for: "CHOL", "HDL", "LDLCALC", "LDLDIRECT",  "TRIG", "CHOLHDL"  Significant Diagnostic Results in last 30 days:  No results found.  Assessment/Plan Depression with anxiety The patient appears sedated, HPOA desires GDR, takes Lexpro, Clonazepam(decreased to 0.25mg /0.5mg  hs 11/06/22), Depakote, off Quetiapine, on Hydroxyzine hs prn(changed to prn 11/06/22), observe.   CKD (chronic kidney disease) stage 3, GFR 30-59 ml/min (HCC) stable. Bun/creat 14/1.1 09/02/22  GERD (gastroesophageal reflux disease) stable, taking Omeprazole. Hgb 13.1 09/02/22  Senile dementia (HCC)  takes Depakote, off Seroquel. Vistaril hs prn(GDR 11/06/22) On Namenda. MMSE 10/30 2/2./24     Family/ staff Communication: plan of care reviewed with the patient and charge nurse.   Labs/tests ordered:  none  Time spend 35 minutes.

## 2022-11-06 NOTE — Assessment & Plan Note (Signed)
The patient appears sedated, HPOA desires GDR, takes Lexpro, Clonazepam(decreased to 0.25mg /0.5mg  hs 11/06/22), Depakote, off Quetiapine, on Hydroxyzine hs prn(changed to prn 11/06/22), observe.

## 2022-11-06 NOTE — Assessment & Plan Note (Signed)
takes Depakote, off Seroquel. Vistaril hs prn(GDR 11/06/22) On Namenda. MMSE 10/30 2/2./24

## 2022-11-07 ENCOUNTER — Encounter: Payer: Self-pay | Admitting: Nurse Practitioner

## 2022-11-18 ENCOUNTER — Non-Acute Institutional Stay (SKILLED_NURSING_FACILITY): Payer: Medicare Other | Admitting: Family Medicine

## 2022-11-18 DIAGNOSIS — R451 Restlessness and agitation: Secondary | ICD-10-CM | POA: Diagnosis not present

## 2022-11-18 DIAGNOSIS — F418 Other specified anxiety disorders: Secondary | ICD-10-CM | POA: Diagnosis not present

## 2022-11-18 DIAGNOSIS — R2681 Unsteadiness on feet: Secondary | ICD-10-CM

## 2022-11-18 DIAGNOSIS — M810 Age-related osteoporosis without current pathological fracture: Secondary | ICD-10-CM | POA: Diagnosis not present

## 2022-11-18 NOTE — Progress Notes (Signed)
Provider:  Jacalyn Lefevre, MD Location:      Place of Service:     PCP: Mast, Man X, NP Patient Care Team: Mast, Man X, NP as PCP - General (Internal Medicine)  Extended Emergency Contact Information Primary Emergency Contact: Narda Amber Address: [address]          [place], [place] [address] [name] of [place] Home Phone: [phone] Mobile Phone: [phone] Relation: Relative Secondary Emergency Contact: West,Doug Home Phone: [phone] Mobile Phone: [phone] Relation: Relative  Code Status:  Goals of Care: Advanced Directive information    10/28/2022    9:49 AM  Advanced Directives  Does Patient Have a Medical Advance Directive? Yes  Type of Estate agent of [place];Living will;Out of facility DNR (pink MOST or yellow form)  Does patient want to make changes to medical advance directive? No - Patient declined  Copy of Healthcare Power of Attorney in Chart? Yes - validated most recent copy scanned in chart (See row information)      No chief complaint on file.   HPI: [name] seen today for medical management of chronic problems including dementia depression anxiety, chronic kidney disease, gait abnormality and GERD. Patient has moved from memory care to skilled care and this moved seems to be going well.  I found her in her room today trying to eat breakfast but having a difficult time.  Offered help but she declined.  She continues to take Namenda for her dementia and most recent MMSE was 10/30, 4 months ago. There have been no recent falls she does need reminders regarding safety.  She is now off Seroquel but takes Lexapro Klonopin and Depakote for depression anxiety as well as other behavioral issues related to her dementia. Nurses report no new problems and she offers no complaints  Past Medical History:  Diagnosis Date   Arthritis    Dementia (HCC)    Hx of concussion    [facility]  [place]   Hyperlipidemia    Vitamin D deficiency, unspecified    Past Surgical History:  Procedure Laterality Date   CHOLECYSTECTOMY      reports that she has never smoked. She has never used smokeless tobacco. She reports that she does not drink alcohol and does not use drugs. Social History   Socioeconomic History   Marital status: Single    Spouse name: Not on file   Number of children: Not on file   Years of education: Not on file   Highest education level: Not on file  Occupational History   Not on file  Tobacco Use   Smoking status: Never   Smokeless tobacco: Never  Vaping Use   Vaping Use: Never used  Substance and Sexual Activity   Alcohol use: Never   Drug use: Never   Sexual activity: Never  Other Topics Concern   Not on file  Social History Narrative   Not on file   Social Determinants of Health   Financial Resource Strain: Not on file  Food Insecurity: Not on file  Transportation Needs: Not on file  Physical Activity: Not on file  Stress: Not on file  Social Connections: Not on file  Intimate Partner Violence: Not on file    Functional Status Survey:    Family History  Family history unknown: Yes    Health Maintenance  Topic Date Due   COVID-19 Vaccine (8 - 2023-24 season) 12/25/2022 (Originally 05/22/2022)   INFLUENZA VACCINE  12/25/2022  Medicare Annual Wellness (AWV)  06/25/2023   DTaP/Tdap/Td (3 - Td or Tdap) 01/14/2031   Pneumonia Vaccine 14+ Years old  Completed   DEXA SCAN  Completed   Zoster Vaccines- Shingrix  Completed   HPV VACCINES  Aged Out    Allergies  Allergen Reactions   Fish-Derived Products    Pravachol [Pravastatin Sodium]     Outpatient Encounter Medications as of 11/18/2022  Medication Sig   alendronate (FOSAMAX) 70 MG tablet Take 70 mg by mouth once a week. Take with a full glass of water on an empty stomach.   Artificial Tears ophthalmic solution Place 3 drops into both eyes 3 (three) times daily. related to  Dry eye syndrome of bilateral lacrimal glands   Calcium Carb-Cholecalciferol (CALTRATE 600+D3) 600-20 MG-MCG TABS Take 1 tablet by mouth in the morning and at bedtime.   clonazePAM (KLONOPIN) 0.5 MG tablet Take 1 tablet (0.5 mg total) by mouth at bedtime.   divalproex (DEPAKOTE) 125 MG DR tablet Take 125 mg by mouth 3 (three) times daily. * DO NOT CRUSH OR CHEW* *HAZARDOUS DRUG: WEAR GLOVES*   escitalopram (LEXAPRO) 10 MG tablet Take 10 mg by mouth daily.   hydrOXYzine (VISTARIL) 25 MG capsule Take 25 mg by mouth at bedtime.   memantine (NAMENDA) 10 MG tablet Take 10 mg by mouth 2 (two) times daily.   omeprazole (PRILOSEC) 20 MG capsule Take 20 mg by mouth daily.   No facility-administered encounter medications on file as of 11/18/2022.    Review of Systems  Unable to perform ROS: Dementia    There were no vitals filed for this visit. There is no height or weight on file to calculate BMI. Physical Exam Vitals and nursing note reviewed.  Constitutional:      Appearance: Normal appearance.  HENT:     Mouth/Throat:     Mouth: Mucous membranes are moist.     Pharynx: Oropharynx is clear.  Eyes:     Extraocular Movements: Extraocular movements intact.     Pupils: Pupils are equal, round, and reactive to light.  Cardiovascular:     Rate and Rhythm: Normal rate and regular rhythm.  Pulmonary:     Effort: Pulmonary effort is normal.     Breath sounds: Normal breath sounds.  Abdominal:     General: Bowel sounds are normal.     Palpations: Abdomen is soft.  Neurological:     General: No focal deficit present.     Mental Status: She is alert and oriented to person, place, and time.  Psychiatric:        Mood and Affect: Mood normal.        Behavior: Behavior normal.     Labs reviewed: Basic Metabolic Panel: Recent Labs    12/13/21 0000 02/19/22 0235 09/02/22 0000  NA 135* 137 135*  K 4.3  4.3 3.9 4.1  CL 98*  98* 106 98*  CO2 25*  25* 24 31*  GLUCOSE  --  117*  --    BUN 14 14 14   CREATININE 1.3*  1.3* 0.86 1.1  CALCIUM 9.2  9.2 7.7* 8.8   Liver Function Tests: Recent Labs    12/13/21 0000 02/19/22 0235 09/02/22 0000  AST 12* 18 11*  ALT 6* 9 5*  ALKPHOS 80 62 71  BILITOT  --  0.7  --   PROT  --  6.2*  --   ALBUMIN 4.0 2.8* 3.7   Recent Labs    02/19/22 0235  LIPASE  24   No results for input(s): "AMMONIA" in the last 8760 hours. CBC: Recent Labs    12/13/21 0000 02/19/22 0116 09/02/22 0000  WBC 5.7 10.4 6.3  NEUTROABS 4,047.00  --  4,108.00  HGB 13.0 13.5 13.1  HCT 39 40.0 40  MCV  --  87.5  --   PLT 204 214 232   Cardiac Enzymes: No results for input(s): "CKTOTAL", "CKMB", "CKMBINDEX", "TROPONINI" in the last 8760 hours. BNP: Invalid input(s): "POCBNP" No results found for: "HGBA1C" Lab Results  Component Value Date   TSH 2.70 05/15/2021   Lab Results  Component Value Date   VITAMINB12 466 07/12/2019   No results found for: "FOLATE" No results found for: "IRON", "TIBC", "FERRITIN"  Imaging and Procedures obtained prior to SNF admission: CT ABDOMEN PELVIS W CONTRAST  Result Date: 02/19/2022 CLINICAL DATA:  89 year old female with history of acute onset of nonlocalized abdominal pain with nausea and vomiting for 1 day. EXAM: CT ABDOMEN AND PELVIS WITH CONTRAST TECHNIQUE: Multidetector CT imaging of the abdomen and pelvis was performed using the standard protocol following bolus administration of intravenous contrast. RADIATION DOSE REDUCTION: This exam was performed according to the departmental dose-optimization program which includes automated exposure control, adjustment of the mA and/or kV according to patient size and/or use of iterative reconstruction technique. CONTRAST:  OMNIPAQUE IOHEXOL 300 MG/ML  SOLN COMPARISON:  CT of the abdomen and pelvis 05/12/2020. FINDINGS: Lower chest: Atherosclerotic calcifications in the descending thoracic aorta as well as the right coronary artery. Hepatobiliary: Well-defined  8.2 x 6.0 x 3.6 cm low-attenuation collection beneath the right hemidiaphragm adjacent to the superior aspect of the right lobe of the liver predominantly next to segment 8 (axial image 12 of series 2 and coronal image 69 of series 4), larger than remote prior study 05/12/2020. Previously noted indwelling catheter has been removed since that time. Remainder of the liver is otherwise unremarkable in appearance, with no new suspicious appearing solid hepatic lesions. No intra or extrahepatic biliary ductal dilatation. Status post cholecystectomy. Pancreas: No pancreatic mass. No pancreatic ductal dilatation. No pancreatic or peripancreatic fluid collections or inflammatory changes. Spleen: Unremarkable. Adrenals/Urinary Tract: Bilateral kidneys and adrenal glands are normal in appearance. No hydroureteronephrosis. Urinary bladder is normal in appearance. Stomach/Bowel: The appearance of the stomach is normal. There is no pathologic dilatation of small bowel or colon. Numerous colonic diverticuli are noted, without focal surrounding inflammatory changes to indicate an acute diverticulitis at this time. Postoperative changes in the sigmoid colon of prior partial colectomy with what appears to be an end-to-side anastomosis. The appendix is not confidently identified and may be surgically absent. Regardless, there are no inflammatory changes noted adjacent to the cecum to suggest the presence of an acute appendicitis at this time. Vascular/Lymphatic: Atherosclerotic calcifications throughout the abdominal aorta and pelvic vasculature, without evidence of aneurysm or dissection. No lymphadenopathy noted in the abdomen or pelvis. Reproductive: Uterus and ovaries are atrophic. Coarse calcifications in the uterine body, likely reflective of a small fibroid. Other: No significant volume of ascites.  No pneumoperitoneum. Musculoskeletal: There are no aggressive appearing lytic or blastic lesions noted in the visualized portions  of the skeleton. IMPRESSION: 1. Subcapsular fluid collection adjacent to the superior aspect of the right lobe of the liver. This is unusual in appearance, but favored to be chronic given the presence of an indwelling catheter in this region on remote prior CT scan 05/12/2020. Although this may simply represent a chronic sterile fluid collection such as a  seroma, the possibility of recurrent abscess is not excluded. Further clinical evaluation is recommended. 2. No other acute findings are noted elsewhere in the abdomen or pelvis to account for the patient's symptoms. 3. Extensive colonic diverticulosis without evidence of acute diverticulitis at this time. 4. Aortic atherosclerosis, in addition to at least right coronary artery disease. 5. Additional incidental findings, as above. Electronically Signed   By: Trudie Reed M.D.   On: 02/19/2022 05:21    Assessment/Plan 1. Depression with anxiety Continue with Lexapro and Klonopin  2. Age-related osteoporosis without current pathological fracture Continue with bisphosphonate and calcium and vitamin D supplement  3. Unsteady gait Apparently not using walker but holding onto the wall and furniture  4. Agitation Off Seroquel    Family/ staff Communication:   Labs/tests ordered:  Bertram Millard. Hyacinth Meeker, MD [facility] [address] [place], [place] [address] Office [phone]

## 2022-12-08 ENCOUNTER — Other Ambulatory Visit: Payer: Self-pay | Admitting: Orthopedic Surgery

## 2022-12-08 DIAGNOSIS — F01C18 Vascular dementia, severe, with other behavioral disturbance: Secondary | ICD-10-CM

## 2022-12-08 DIAGNOSIS — F418 Other specified anxiety disorders: Secondary | ICD-10-CM

## 2022-12-08 MED ORDER — CLONAZEPAM 0.5 MG PO TABS: 0.5000 mg | ORAL_TABLET | Freq: Every day | ORAL | 0 refills | Status: DC

## 2022-12-23 ENCOUNTER — Encounter: Payer: Self-pay | Admitting: Nurse Practitioner

## 2022-12-23 ENCOUNTER — Non-Acute Institutional Stay (SKILLED_NURSING_FACILITY): Payer: Medicare Other | Admitting: Nurse Practitioner

## 2022-12-23 DIAGNOSIS — F0390 Unspecified dementia without behavioral disturbance: Secondary | ICD-10-CM

## 2022-12-23 DIAGNOSIS — N1831 Chronic kidney disease, stage 3a: Secondary | ICD-10-CM

## 2022-12-23 DIAGNOSIS — F418 Other specified anxiety disorders: Secondary | ICD-10-CM

## 2022-12-23 DIAGNOSIS — M810 Age-related osteoporosis without current pathological fracture: Secondary | ICD-10-CM

## 2022-12-23 DIAGNOSIS — R2681 Unsteadiness on feet: Secondary | ICD-10-CM

## 2022-12-23 DIAGNOSIS — K219 Gastro-esophageal reflux disease without esophagitis: Secondary | ICD-10-CM

## 2022-12-23 NOTE — Assessment & Plan Note (Signed)
furniture or walker, needs reminder for safety.  

## 2022-12-23 NOTE — Assessment & Plan Note (Signed)
Stable, takes Depakote, Namenda, off Seroquel. MMSE 10/30 06/27/22

## 2022-12-23 NOTE — Assessment & Plan Note (Signed)
DEXA 06/20/22 t score -2.597, on Ca, Vit D, Alendronate

## 2022-12-23 NOTE — Progress Notes (Signed)
Location:  [facility] Room Number: NO/55/A Place of Service:  SNF (31) Provider:  ,  X, NP  Patient Care Team: ,  X, NP as PCP - General (Internal Medicine)  Extended Emergency Contact Information Primary Emergency Contact: Narda Amber Address: [address]          [place], [place] [address] [name] of [place] Home Phone: [phone] Mobile Phone: [phone] Relation: Relative Secondary Emergency Contact: West,Doug Home Phone: [phone] Mobile Phone: [phone] Relation: Relative  Code Status:  DNR Goals of care: Advanced Directive information    12/23/2022   10:06 AM  Advanced Directives  Does Patient Have a Medical Advance Directive? Yes  Type of Estate agent of [place];Living will;Out of facility DNR (pink MOST or yellow form)  Does patient want to make changes to medical advance directive? No - Patient declined  Copy of Healthcare Power of Attorney in Chart? Yes - validated most recent copy scanned in chart (See row information)     Chief Complaint  Patient presents with   Medical agement of Chronic Issues    Routine Visit    HPI:  Pt is a 89 y.o. female seen today for medical management of chronic diseases.     Serum Ca 8.8 09/02/22   DEXA 06/20/22 t score -2.597, on Ca, Vit D, Alendronate             Dementia/behavioral issues, takes Depakote, Namenda, off Seroquel. MMSE 10/30 06/27/22             Depression/anxiety, takes Lexpro, Clonazepam(decreased to 0.25mg /0.5mg  hs 11/06/22), Depakote, Hydroxyzine hs prn since 11/06/22, off Quetiapine CKD stable. Bun/creat 14/1.1 09/02/22 Elevated TSH 6.72 02/15/21>>2.7 05/14/21.  Gait abnormality, furniture or walker, needs reminder for safety.  GERD, stable, taking Omeprazole. Hgb 13.1 09/02/22 Seroma per surgeon consultation 02/19/22 CT abd/pelvis with fluid collection in the liver at the same site of prior abscess in 2021    Past Medical  History:  Diagnosis Date   Arthritis    Dementia (HCC)    Hx of concussion    [facility] [place]   Hyperlipidemia    Vitamin D deficiency, unspecified    Past Surgical History:  Procedure Laterality Date   CHOLECYSTECTOMY      Allergies  Allergen Reactions   Fish-Derived Products    Pravachol [Pravastatin Sodium]     Outpatient Encounter Medications as of 12/23/2022  Medication Sig   alendronate (FOSAMAX) 70 MG tablet Take 70 mg by mouth once a week. Take with a full glass of water on an empty stomach.   Artificial Tears ophthalmic solution Place 3 drops into both eyes 3 (three) times daily. related to Dry eye syndrome of bilateral lacrimal glands   Calcium Carb-Cholecalciferol (CALTRATE 600+D3) 600-20 MG-MCG TABS Take 1 tablet by mouth in the morning and at bedtime.   clonazePAM (KLONOPIN) 0.5 MG tablet Take 1 tablet (0.5 mg total) by mouth at bedtime.   divalproex (DEPAKOTE) 125 MG DR tablet Take 125 mg by mouth 3 (three) times daily. * DO NOT CRUSH OR CHEW* *HAZARDOUS DRUG: WEAR GLOVES*   escitalopram (LEXAPRO) 10 MG tablet Take 10 mg by mouth daily.   hydrOXYzine (VISTARIL) 25 MG capsule Take 25 mg by mouth at bedtime.   memantine (NAMENDA) 10 MG tablet Take 10 mg by mouth 2 (two) times daily.   omeprazole (PRILOSEC) 20 MG capsule Take 20 mg by mouth daily.   No facility-administered encounter medications on file as of 12/23/2022.  Review of Systems  Constitutional:  Positive for unexpected weight change. Negative for appetite change, fatigue and fever.       About #6Ibs weight loss in the past a month or 2, f/u dietary  HENT:  Positive for hearing loss. Negative for congestion and voice change.   Eyes:  Negative for visual disturbance.  Respiratory:  Negative for cough.   Cardiovascular:  Negative for leg swelling.  Gastrointestinal:  Negative for abdominal pain and constipation.  Genitourinary:  Negative for dysuria, frequency and urgency.   Musculoskeletal:  Positive for arthralgias and gait problem. Negative for myalgias.       Rails or furniture walking sometimes.   Skin:  Negative for color change.  Neurological:  Negative for speech difficulty, weakness and headaches.       Dementia  Psychiatric/Behavioral:  Positive for confusion. Negative for agitation, behavioral problems and sleep disturbance. The patient is not nervous/anxious.        Less emotional, behavioral outbursts     Immunization History  Administered Date(s) Administered   Influenza-Unspecified 02/26/2019, 03/07/2020, 03/13/2021, 03/24/2022   Moderna Covid-19 Vaccine Bivalent Booster 55yrs & up 03/27/2022   Moderna Sars-Covid-2 Vaccination 05/28/2019, 06/25/2019, 07/23/2019, 04/03/2020, 10/23/2020, 10/11/2021   PFIZER(Purple Top)SARS-COV-2 Vaccination 02/12/2021   Pneumococcal Conjugate-13 12/31/2018   Pneumococcal Polysaccharide-23 02/24/2004   Pneumococcal-Unspecified 05/22/2016   Respiratory Syncytial Virus Vaccine,Recomb Aduvanted(Arexvy) 06/13/2022   Tdap 10/03/2017, 01/13/2021   Zoster Recombinant(Shingrix) 04/29/2021, 06/26/2021   Zoster, Live 06/12/2022   Pertinent  Health Maintenance Due  Topic Date Due   INFLUENZA VACCINE  12/25/2022   DEXA SCAN  Completed      01/13/2021    6:42 PM 02/19/2022   12:53 AM 06/24/2022    2:30 PM 06/27/2022   11:00 AM 10/28/2022    9:50 AM  Fall Risk  Falls in the past year?   0 0 0  Was there an injury with Fall?   0 0 0  Fall Risk Category Calculator   0  0  (RETIRED) Patient Fall Risk Level Moderate fall risk High fall risk     Patient at Risk for Falls Due to   No Fall Risks No Fall Risks History of fall(s)  Fall risk Follow up   Falls evaluation completed Falls evaluation completed Falls evaluation completed   Functional Status Survey:    Vitals:   12/23/22 0959  BP: 110/79  Pulse: 71  Resp: 17  Temp: (!) 97.4 F (36.3 C)  SpO2: 97%  Weight: 163 lb (73.9 kg)  Height: 5\' 6"  (1.676 m)   Body  mass index is 26.31 kg/m. Physical Exam Vitals and nursing note reviewed.  Constitutional:      Appearance: Normal appearance.  HENT:     Head: Normocephalic and atraumatic.     Mouth/Throat:     Mouth: Mucous membranes are moist.  Eyes:     Extraocular Movements: Extraocular movements intact.     Conjunctiva/sclera: Conjunctivae normal.     Pupils: Pupils are equal, round, and reactive to light.  Cardiovascular:     Rate and Rhythm: Normal rate and regular rhythm.     Heart sounds: No murmur heard. Pulmonary:     Effort: Pulmonary effort is normal.     Breath sounds: No rales.  Abdominal:     General: Bowel sounds are normal.     Palpations: Abdomen is soft.     Tenderness: There is no abdominal tenderness.  Musculoskeletal:     Right lower leg: No edema.  Left lower leg: No edema.  Skin:    General: Skin is warm and dry.  Neurological:     General: No focal deficit present.     Mental Status: She is alert. Mental status is at baseline.     Gait: Gait abnormal.     Comments: Oriented to person  Psychiatric:     Comments: Confused, pleasant, conversing.      Labs reviewed: Recent Labs    02/19/22 0235 09/02/22 0000  NA 137 135*  K 3.9 4.1  CL 106 98*  CO2 24 31*  GLUCOSE 117*  --   BUN 14 14  CREATININE 0.86 1.1  CALCIUM 7.7* 8.8   Recent Labs    02/19/22 0235 09/02/22 0000  AST 18 11*  ALT 9 5*  ALKPHOS 62 71  BILITOT 0.7  --   PROT 6.2*  --   ALBUMIN 2.8* 3.7   Recent Labs    02/19/22 0116 09/02/22 0000  WBC 10.4 6.3  NEUTROABS  --  4,108.00  HGB 13.5 13.1  HCT 40.0 40  MCV 87.5  --   PLT 214 232   Lab Results  Component Value Date   TSH 2.70 05/15/2021   No results found for: "HGBA1C" No results found for: "CHOL", "HDL", "LDLCALC", "LDLDIRECT", "TRIG", "CHOLHDL"  Significant Diagnostic Results in last 30 days:  No results found.  Assessment/Plan There are no diagnoses linked to this encounter.   Family/ staff  Communication: plan of care reviewed with the patient and charge nurse.   Labs/tests ordered:  none  Time spend 35 minutes.

## 2022-12-23 NOTE — Assessment & Plan Note (Signed)
stable, taking Omeprazole. Hgb 13.1 09/02/22

## 2022-12-23 NOTE — Assessment & Plan Note (Signed)
Ca 8.8 09/02/22

## 2022-12-23 NOTE — Assessment & Plan Note (Signed)
Stable,  takes Lexpro, Clonazepam(decreased to 0.25mg /0.5mg  hs 11/06/22), Depakote, Hydroxyzine hs prn since 11/06/22, off Quetiapine

## 2022-12-23 NOTE — Assessment & Plan Note (Signed)
stable. Bun/creat 14/1.1 09/02/22

## 2023-01-02 ENCOUNTER — Encounter: Payer: Self-pay | Admitting: Family Medicine

## 2023-01-05 ENCOUNTER — Encounter: Payer: Self-pay | Admitting: Family Medicine

## 2023-02-10 ENCOUNTER — Encounter: Payer: Self-pay | Admitting: Nurse Practitioner

## 2023-02-10 ENCOUNTER — Non-Acute Institutional Stay (SKILLED_NURSING_FACILITY): Payer: Medicare Other | Admitting: Nurse Practitioner

## 2023-02-10 DIAGNOSIS — N1831 Chronic kidney disease, stage 3a: Secondary | ICD-10-CM | POA: Diagnosis not present

## 2023-02-10 DIAGNOSIS — F0390 Unspecified dementia without behavioral disturbance: Secondary | ICD-10-CM

## 2023-02-10 DIAGNOSIS — Z66 Do not resuscitate: Secondary | ICD-10-CM | POA: Diagnosis not present

## 2023-02-10 DIAGNOSIS — F418 Other specified anxiety disorders: Secondary | ICD-10-CM

## 2023-02-10 DIAGNOSIS — M810 Age-related osteoporosis without current pathological fracture: Secondary | ICD-10-CM

## 2023-02-10 DIAGNOSIS — K219 Gastro-esophageal reflux disease without esophagitis: Secondary | ICD-10-CM

## 2023-02-10 NOTE — Progress Notes (Unsigned)
Location:  [facility] Room Number: N055-A Place of Service:  SNF (31) Provider:  Jenella Craigie X Ayala Ribble  Adolphus Hanf X, NP  Patient Care Team: Colene Mines X, NP as PCP - General (Internal Medicine)  Extended Emergency Contact Information Primary Emergency Contact: Narda Amber Address: [address]          [place], [place] [address] [name] of [place] Home Phone: [phone] Mobile Phone: [phone] Relation: Relative Secondary Emergency Contact: West,Doug Home Phone: [phone] Mobile Phone: [phone] Relation: Relative  Code Status:  DNR Goals of care: Advanced Directive information    02/10/2023   10:41 AM  Advanced Directives  Does Patient Have a Medical Advance Directive? Yes  Type of Estate agent of [place];Out of facility DNR (pink MOST or yellow form);Living will  Does patient want to make changes to medical advance directive? Yes (Inpatient - patient defers changing a medical advance directive at this time - Information given)  Copy of Healthcare Power of Attorney in Chart? Yes - validated most recent copy scanned in chart (See row information)     Chief Complaint  Patient presents with   Medical Management of Chronic Issues    HPI:  Pt is a 89 y.o. female seen today for managing chronic medical conditions.  Serum Ca 8.8 09/02/22   DEXA 06/20/22 t score -2.597, on Ca, Vit D, Alendronate             Dementia/behavioral issues, takes Depakote, Namenda, off Seroquel. MMSE 10/30 06/27/22             Depression/anxiety, takes Lexpro, Clonazepam(decreased to 0.25mg /0.5mg  hs 11/06/22), Depakote, off Hydroxyzine, Quetiapine CKD stable. Bun/creat 14/1.1 09/02/22 Elevated TSH 6.72 02/15/21>>2.7 05/14/21.  Gait abnormality, furniture or walker, needs reminder for safety.  GERD, stable, taking Omeprazole. Hgb 13.1 09/02/22 Seroma per surgeon consultation 02/19/22 CT abd/pelvis with fluid collection in the liver at the same site  of prior abscess in 2021       Past Medical History:  Diagnosis Date   Arthritis    Dementia (HCC)    Hx of concussion    [facility]   Hyperlipidemia    Vitamin D deficiency, unspecified    Past Surgical History:  Procedure Laterality Date   CHOLECYSTECTOMY      Allergies  Allergen Reactions   Fish-Derived Products    Pravachol [Pravastatin Sodium]     Outpatient Encounter Medications as of 02/10/2023  Medication Sig   alendronate (FOSAMAX) 70 MG tablet Take 70 mg by mouth once a week. Take with a full glass of water on an empty stomach.   Artificial Tears ophthalmic solution Place 3 drops into both eyes 3 (three) times daily. related to Dry eye syndrome of bilateral lacrimal glands   Calcium Carb-Cholecalciferol (CALTRATE 600+D3) 600-20 MG-MCG TABS Take 1 tablet by mouth in the morning and at bedtime.   clonazePAM (KLONOPIN) 0.5 MG tablet Take 1 tablet (0.5 mg total) by mouth at bedtime. (Patient taking differently: Take 0.25 mg by mouth at bedtime.)   divalproex (DEPAKOTE) 125 MG DR tablet Take 125 mg by mouth 2 (two) times daily. * DO NOT CRUSH OR CHEW* *HAZARDOUS DRUG: WEAR GLOVES*   escitalopram (LEXAPRO) 10 MG tablet Take 10 mg by mouth daily.   memantine (NAMENDA) 10 MG tablet Take 10 mg by mouth 2 (two) times daily.   omeprazole (PRILOSEC) 20 MG capsule Take 20 mg by mouth daily.   [DISCONTINUED] hydrOXYzine (VISTARIL) 25 MG capsule Take  25 mg by mouth at bedtime.   No facility-administered encounter medications on file as of 02/10/2023.    Review of Systems  Constitutional:  Negative for appetite change, fatigue and fever.  HENT:  Positive for hearing loss. Negative for congestion and voice change.   Eyes:  Negative for visual disturbance.  Respiratory:  Negative for cough.   Cardiovascular:  Negative for leg swelling.  Gastrointestinal:  Negative for abdominal pain and constipation.  Genitourinary:  Negative for dysuria, frequency and  urgency.  Musculoskeletal:  Positive for arthralgias and gait problem. Negative for myalgias.       Rails or furniture walking sometimes.   Skin:  Negative for color change.  Neurological:  Negative for speech difficulty, weakness and headaches.       Dementia  Psychiatric/Behavioral:  Positive for confusion. Negative for agitation, behavioral problems and sleep disturbance. The patient is not nervous/anxious.        Less emotional, behavioral outbursts     Immunization History  Administered Date(s) Administered   Influenza-Unspecified 02/26/2019, 03/07/2020, 03/13/2021, 03/24/2022   Moderna Covid-19 Vaccine Bivalent Booster 74yrs & up 03/27/2022   Moderna Sars-Covid-2 Vaccination 05/28/2019, 06/25/2019, 07/23/2019, 04/03/2020, 10/23/2020, 10/11/2021   PFIZER(Purple Top)SARS-COV-2 Vaccination 02/12/2021   Pneumococcal Conjugate-13 12/31/2018   Pneumococcal Polysaccharide-23 02/24/2004   Pneumococcal-Unspecified 05/22/2016   Respiratory Syncytial Virus Vaccine,Recomb Aduvanted(Arexvy) 06/13/2022   Tdap 10/03/2017, 01/13/2021   Zoster Recombinant(Shingrix) 04/29/2021, 06/26/2021   Zoster, Live 06/12/2022   Pertinent  Health Maintenance Due  Topic Date Due   INFLUENZA VACCINE  12/25/2022   DEXA SCAN  Completed      01/13/2021    6:42 PM 02/19/2022   12:53 AM 06/24/2022    2:30 PM 06/27/2022   11:00 AM 10/28/2022    9:50 AM  Fall Risk  Falls in the past year?   0 0 0  Was there an injury with Fall?   0 0 0  Fall Risk Category Calculator   0  0  (RETIRED) Patient Fall Risk Level Moderate fall risk High fall risk     Patient at Risk for Falls Due to   No Fall Risks No Fall Risks History of fall(s)  Fall risk Follow up   Falls evaluation completed Falls evaluation completed Falls evaluation completed   Functional Status Survey:    Vitals:   02/10/23 1036  BP: 128/78  Pulse: 68  Resp: 18  Temp: 97.6 F (36.4 C)  SpO2: 98%  Weight: 151 lb 14.4 oz (68.9 kg)  Height: 5\' 6"   (1.676 m)   Body mass index is 24.52 kg/m. Physical Exam Vitals and nursing note reviewed.  Constitutional:      Appearance: Normal appearance.  HENT:     Head: Normocephalic and atraumatic.     Mouth/Throat:     Mouth: Mucous membranes are moist.  Eyes:     Extraocular Movements: Extraocular movements intact.     Conjunctiva/sclera: Conjunctivae normal.     Pupils: Pupils are equal, round, and reactive to light.  Cardiovascular:     Rate and Rhythm: Normal rate and regular rhythm.     Heart sounds: No murmur heard. Pulmonary:     Effort: Pulmonary effort is normal.     Breath sounds: No rales.  Abdominal:     General: Bowel sounds are normal.     Palpations: Abdomen is soft.     Tenderness: There is no abdominal tenderness.  Musculoskeletal:     Right lower leg: No edema.  Left lower leg: No edema.  Skin:    General: Skin is warm and dry.  Neurological:     General: No focal deficit present.     Mental Status: She is alert. Mental status is at baseline.     Gait: Gait abnormal.     Comments: Oriented to person  Psychiatric:     Comments: Confused, pleasant, conversing.      Labs reviewed: Recent Labs    02/19/22 0235 09/02/22 0000  NA 137 135*  K 3.9 4.1  CL 106 98*  CO2 24 31*  GLUCOSE 117*  --   BUN 14 14  CREATININE 0.86 1.1  CALCIUM 7.7* 8.8   Recent Labs    02/19/22 0235 09/02/22 0000  AST 18 11*  ALT 9 5*  ALKPHOS 62 71  BILITOT 0.7  --   PROT 6.2*  --   ALBUMIN 2.8* 3.7   Recent Labs    02/19/22 0116 09/02/22 0000  WBC 10.4 6.3  NEUTROABS  --  4,108.00  HGB 13.5 13.1  HCT 40.0 40  MCV 87.5  --   PLT 214 232   Lab Results  Component Value Date   TSH 2.70 05/15/2021   No results found for: "HGBA1C" No results found for: "CHOL", "HDL", "LDLCALC", "LDLDIRECT", "TRIG", "CHOLHDL"  Significant Diagnostic Results in last 30 days:  No results found.  Assessment/Plan Senile dementia (HCC) Stable,  takes Depakote, Namenda, off  Seroquel. MMSE 10/30 06/27/22  Depression with anxiety takes Lexpro, Clonazepam(decreased to 0.25mg /0.5mg  hs 11/06/22), Depakote, off Hydroxyzine, Quetiapine  CKD (chronic kidney disease) stage 3, GFR 30-59 ml/min (HCC) stable. Bun/creat 14/1.1 09/02/22  GERD (gastroesophageal reflux disease) stable, taking Omeprazole. Hgb 13.1 09/02/22  Osteoporosis DEXA 06/20/22 t score -2.597, on Ca, Vit D, Alendronate     Family/ staff Communication: plan of care reviewed with the patient and charge nurse.   Labs/tests ordered:  none  Time spend 25 minutes.

## 2023-02-10 NOTE — Assessment & Plan Note (Signed)
stable, taking Omeprazole. Hgb 13.1 09/02/22

## 2023-02-10 NOTE — Assessment & Plan Note (Signed)
Stable, takes Depakote, Namenda, off Seroquel. MMSE 10/30 06/27/22

## 2023-02-10 NOTE — Assessment & Plan Note (Signed)
stable. Bun/creat 14/1.1 09/02/22

## 2023-02-10 NOTE — Assessment & Plan Note (Signed)
takes Lexpro, Clonazepam(decreased to 0.25mg /0.5mg  hs 11/06/22), Depakote, off Hydroxyzine, Quetiapine

## 2023-02-10 NOTE — Assessment & Plan Note (Signed)
DEXA 06/20/22 t score -2.597, on Ca, Vit D, Alendronate

## 2023-03-02 ENCOUNTER — Non-Acute Institutional Stay (SKILLED_NURSING_FACILITY): Payer: BC Managed Care – PPO | Admitting: Sports Medicine

## 2023-03-02 DIAGNOSIS — R051 Acute cough: Secondary | ICD-10-CM

## 2023-03-02 NOTE — Progress Notes (Unsigned)
Provider:   Venita Sheffield MD Location:   [facility]    Place of Service:   SNF [place]  PCP: Mast, Man X, NP Patient Care Team: Mast, Man X, NP as PCP - General (Internal Medicine)  Extended Emergency Contact Information Primary Emergency Contact: Sarah Larsen Address: [address]          [place], [place] [address] Sarah Larsen Home Phone: [phone] Mobile Phone: [phone] Relation: Relative Secondary Emergency Contact: Sarah Larsen Home Phone: [phone] Mobile Phone: [phone] Relation: Relative  Code Status:  Goals of Care: Advanced Directive information    02/10/2023   10:41 AM  Advanced Directives  Does Patient Have a Medical Advance Directive? Yes  Type of Estate agent of [place];Out of facility DNR (pink MOST or yellow form);Living will  Does patient want to make changes to medical advance directive? Yes (Inpatient - patient defers changing a medical advance directive at this time - Information given)  Copy of Healthcare Power of Attorney in Chart? Yes - validated most recent copy scanned in chart (See row information)      No chief complaint on file.   HPI: [name] seen today for routine visit   Pt is a poor historian most of the history is obtained from nursing staff As per staff pt noted with increasing cough with phlegm production since few days  She is afebrile Pt knows her name, not oriented to time or place As per nursing staff pt confused at times. She has trouble finding words and unable to express her needs. She is incontinent with bowel and bladder and needs 1 person assistance with all her ADLS. Pt is wheel chair dependent and needs staff to push her wheelchair. She needs assistance with all her ADLS  No new agitation or confusion as per nursing staff          Past Medical History:  Diagnosis Date   Arthritis    Dementia (HCC)    Hx of concussion     [facility]   Hyperlipidemia    Vitamin D deficiency, unspecified    Past Surgical History:  Procedure Laterality Date   CHOLECYSTECTOMY      reports that she has never smoked. She has never used smokeless tobacco. She reports that she does not drink alcohol and does not use drugs. Social History   Socioeconomic History   Marital status: Single    Spouse name: Not on file   Number of children: Not on file   Years of education: Not on file   Highest education level: Not on file  Occupational History   Not on file  Tobacco Use   Smoking status: Never   Smokeless tobacco: Never  Vaping Use   Vaping status: Never Used  Substance and Sexual Activity   Alcohol use: Never   Drug use: Never   Sexual activity: Never  Other Topics Concern   Not on file  Social History Narrative   Not on file   Social Determinants of Health   Financial Resource Strain: Not on file  Food Insecurity: Not on file  Transportation Needs: Not on file  Physical Activity: Not on file  Stress: Not on file  Social Connections: Not on file  Intimate Partner Violence: Not on file    Functional Status Survey:    Family History  Family history unknown: Yes    Health Maintenance  Topic Date Due   INFLUENZA VACCINE  12/25/2022   COVID-19 Vaccine (9 - 2023-24 season) 01/25/2023   Medicare Annual Wellness (AWV)  06/25/2023   DTaP/Tdap/Td (3 - Td or Tdap) 01/14/2031   Pneumonia Vaccine 29+ Years old  Completed   DEXA SCAN  Completed   Zoster Vaccines- Shingrix  Completed   HPV VACCINES  Aged Out    Allergies  Allergen Reactions   Fish-Derived Products    Pravachol [Pravastatin Sodium]     Outpatient Encounter Medications as of 03/02/2023  Medication Sig   alendronate (FOSAMAX) 70 MG tablet Take 70 mg by mouth once a week. Take with a full glass of water on an empty stomach.   Artificial Tears ophthalmic solution Place 3 drops into both eyes 3 (three) times daily. related  to Dry eye syndrome of bilateral lacrimal glands   Calcium Carb-Cholecalciferol (CALTRATE 600+D3) 600-20 MG-MCG TABS Take 1 tablet by mouth in the morning and at bedtime.   clonazePAM (KLONOPIN) 0.5 MG tablet Take 1 tablet (0.5 mg total) by mouth at bedtime. (Patient taking differently: Take 0.25 mg by mouth at bedtime.)   divalproex (DEPAKOTE) 125 MG DR tablet Take 125 mg by mouth 2 (two) times daily. * DO NOT CRUSH OR CHEW* *HAZARDOUS DRUG: WEAR GLOVES*   escitalopram (LEXAPRO) 10 MG tablet Take 10 mg by mouth daily.   memantine (NAMENDA) 10 MG tablet Take 10 mg by mouth 2 (two) times daily.   omeprazole (PRILOSEC) 20 MG capsule Take 20 mg by mouth daily.   No facility-administered encounter medications on file as of 03/02/2023.    Review of Systems  There were no vitals filed for this visit. There is no height or weight on file to calculate BMI. Physical Exam Cardiovascular:     Rate and Rhythm: Normal rate.     Heart sounds: Normal heart sounds.  Pulmonary:     Effort: No respiratory distress.     Breath sounds: No wheezing or rales.  Abdominal:     General: Bowel sounds are normal. There is no distension.     Tenderness: There is no abdominal tenderness.  Musculoskeletal:        General: No swelling.  Neurological:     Mental Status: Mental status is at baseline.     Labs reviewed: Basic Metabolic Panel: Recent Labs    09/02/22 0000  NA 135*  K 4.1  CL 98*  CO2 31*  BUN 14  CREATININE 1.1  CALCIUM 8.8   Liver Function Tests: Recent Labs    09/02/22 0000  AST 11*  ALT 5*  ALKPHOS 71  ALBUMIN 3.7   No results for input(s): "LIPASE", "AMYLASE" in the last 8760 hours. No results for input(s): "AMMONIA" in the last 8760 hours. CBC: Recent Labs    09/02/22 0000  WBC 6.3  NEUTROABS 4,108.00  HGB 13.1  HCT 40  PLT 232   Cardiac Enzymes: No results for input(s): "CKTOTAL", "CKMB", "CKMBINDEX", "TROPONINI" in the last 8760 hours. BNP: Invalid input(s):  "POCBNP" No results found for: "HGBA1C" Lab Results  Component Value Date   TSH 2.70 05/15/2021   Lab Results  Component Value Date   VITAMINB12 466 07/12/2019   No results found for: "FOLATE" No results found for: "IRON", "TIBC", "FERRITIN"  Imaging and Procedures obtained prior to SNF admission: CT ABDOMEN PELVIS W CONTRAST  Result Date: 02/19/2022 CLINICAL DATA:  89 year old female with history of acute onset of nonlocalized abdominal pain with nausea and vomiting for 1 day. EXAM: CT ABDOMEN AND PELVIS WITH CONTRAST  TECHNIQUE: Multidetector CT imaging of the abdomen and pelvis was performed using the standard protocol following bolus administration of intravenous contrast. RADIATION DOSE REDUCTION: This exam was performed according to the departmental dose-optimization program which includes automated exposure control, adjustment of the mA and/or kV according to patient size and/or use of iterative reconstruction technique. CONTRAST:  OMNIPAQUE IOHEXOL 300 MG/ML  SOLN COMPARISON:  CT of the abdomen and pelvis 05/12/2020. FINDINGS: Lower chest: Atherosclerotic calcifications in the descending thoracic aorta as well as the right coronary artery. Hepatobiliary: Well-defined 8.2 Larsen 6.0 Larsen 3.6 cm low-attenuation collection beneath the right hemidiaphragm adjacent to the superior aspect of the right lobe of the liver predominantly next to segment 8 (axial image 12 of series 2 and coronal image 69 of series 4), larger than remote prior study 05/12/2020. Previously noted indwelling catheter has been removed since that time. Remainder of the liver is otherwise unremarkable in appearance, with no new suspicious appearing solid hepatic lesions. No intra or extrahepatic biliary ductal dilatation. Status post cholecystectomy. Pancreas: No pancreatic mass. No pancreatic ductal dilatation. No pancreatic or peripancreatic fluid collections or inflammatory changes. Spleen: Unremarkable. Adrenals/Urinary Tract:  Bilateral kidneys and adrenal glands are normal in appearance. No hydroureteronephrosis. Urinary bladder is normal in appearance. Stomach/Bowel: The appearance of the stomach is normal. There is no pathologic dilatation of small bowel or colon. Numerous colonic diverticuli are noted, without focal surrounding inflammatory changes to indicate an acute diverticulitis at this time. Postoperative changes in the sigmoid colon of prior partial colectomy with what appears to be an end-to-side anastomosis. The appendix is not confidently identified and may be surgically absent. Regardless, there are no inflammatory changes noted adjacent to the cecum to suggest the presence of an acute appendicitis at this time. Vascular/Lymphatic: Atherosclerotic calcifications throughout the abdominal aorta and pelvic vasculature, without evidence of aneurysm or dissection. No lymphadenopathy noted in the abdomen or pelvis. Reproductive: Uterus and ovaries are atrophic. Coarse calcifications in the uterine body, likely reflective of a small fibroid. Other: No significant volume of ascites.  No pneumoperitoneum. Musculoskeletal: There are no aggressive appearing lytic or blastic lesions noted in the visualized portions of the skeleton. IMPRESSION: 1. Subcapsular fluid collection adjacent to the superior aspect of the right lobe of the liver. This is unusual in appearance, but favored to be chronic given the presence of an indwelling catheter in this region on remote prior CT scan 05/12/2020. Although this may simply represent a chronic sterile fluid collection such as a seroma, the possibility of recurrent abscess is not excluded. Further clinical evaluation is recommended. 2. No other acute findings are noted elsewhere in the abdomen or pelvis to account for the patient's symptoms. 3. Extensive colonic diverticulosis without evidence of acute diverticulitis at this time. 4. Aortic atherosclerosis, in addition to at least right coronary  artery disease. 5. Additional incidental findings, as above. Electronically Signed   By: Trudie Reed M.D.   On: 02/19/2022 05:21    Assessment/Plan Cough  Staff did not notice runny nose, fevers  Lungs clear  Will order  Ordered cbc, bmp, chest Larsen ray  Robitussin q6 prn for cough  Monitor vitals every shift   Major Neurocognitive disorder Pt with baseline confusion  No recent agitation  Clonazepam was recently decreased to 0.25 mg from 0.5mg   Currently on depakote  Last MMSE 10/30  2024  Cont with supportive care  High risk of falling  On fall precautions Wheel chair assist   H/o Osteoporosis Last DEXA scan  06/20/2022  Cont with calcium, vit d , fosamax  CKD  Will check bmp avoid nephrotoxic meds  GERD  Stable  Cont with omeprazole    Family/ staff Communication:  care plan discussed with the nursing staff  Labs/tests ordered: cbc, bmp, chest Larsen ray

## 2023-03-03 LAB — BASIC METABOLIC PANEL
BUN: 12 (ref 4–21)
CO2: 29 — AB (ref 13–22)
Chloride: 101 (ref 99–108)
Creatinine: 1.1 (ref 0.5–1.1)
Glucose: 114
Potassium: 4.2 meq/L (ref 3.5–5.1)
Sodium: 139 (ref 137–147)

## 2023-03-03 LAB — CBC: RBC: 4.49 (ref 3.87–5.11)

## 2023-03-03 LAB — COMPREHENSIVE METABOLIC PANEL
Calcium: 8.6 — AB (ref 8.7–10.7)
eGFR: 51

## 2023-03-03 LAB — CBC AND DIFFERENTIAL
HCT: 42 (ref 36–46)
Hemoglobin: 13.3 (ref 12.0–16.0)
Neutrophils Absolute: 6064
Platelets: 293 10*3/uL (ref 150–400)
WBC: 8

## 2023-03-04 ENCOUNTER — Encounter: Payer: Self-pay | Admitting: Sports Medicine

## 2023-03-10 ENCOUNTER — Other Ambulatory Visit: Payer: Self-pay | Admitting: Adult Health

## 2023-03-10 DIAGNOSIS — F418 Other specified anxiety disorders: Secondary | ICD-10-CM

## 2023-03-10 DIAGNOSIS — F01C18 Vascular dementia, severe, with other behavioral disturbance: Secondary | ICD-10-CM

## 2023-03-10 MED ORDER — CLONAZEPAM 0.5 MG PO TABS: 0.2500 mg | ORAL_TABLET | Freq: Every day | ORAL | 0 refills | Status: DC

## 2023-04-07 ENCOUNTER — Non-Acute Institutional Stay (SKILLED_NURSING_FACILITY): Payer: Medicare Other | Admitting: Nurse Practitioner

## 2023-04-07 ENCOUNTER — Encounter: Payer: Self-pay | Admitting: Nurse Practitioner

## 2023-04-07 DIAGNOSIS — F418 Other specified anxiety disorders: Secondary | ICD-10-CM

## 2023-04-07 DIAGNOSIS — N1831 Chronic kidney disease, stage 3a: Secondary | ICD-10-CM

## 2023-04-07 DIAGNOSIS — R2681 Unsteadiness on feet: Secondary | ICD-10-CM

## 2023-04-07 DIAGNOSIS — Z66 Do not resuscitate: Secondary | ICD-10-CM

## 2023-04-07 DIAGNOSIS — R634 Abnormal weight loss: Secondary | ICD-10-CM | POA: Diagnosis not present

## 2023-04-07 DIAGNOSIS — F0390 Unspecified dementia without behavioral disturbance: Secondary | ICD-10-CM

## 2023-04-07 DIAGNOSIS — R627 Adult failure to thrive: Secondary | ICD-10-CM

## 2023-04-07 DIAGNOSIS — K219 Gastro-esophageal reflux disease without esophagitis: Secondary | ICD-10-CM

## 2023-04-07 NOTE — Assessment & Plan Note (Signed)
06/20/22 t score -2.597, on Ca, Vit D, Alendronate

## 2023-04-07 NOTE — Assessment & Plan Note (Signed)
takes Depakote, Namenda, off Seroquel. MMSE 10/30 06/27/22

## 2023-04-07 NOTE — Assessment & Plan Note (Signed)
furniture or walker, needs reminder for safety.  

## 2023-04-07 NOTE — Assessment & Plan Note (Signed)
#  6 IBS in the past month, denied GI symptoms, f/u dietary recommendation, obtain TSH, weekly weight x4

## 2023-04-07 NOTE — Assessment & Plan Note (Signed)
stable, Bun/creat 12/1.1 03/03/23

## 2023-04-07 NOTE — Assessment & Plan Note (Signed)
takes Lexpro, Clonazepam(decreased to 0.25mg /0.5mg  hs 11/06/22), Depakote, off Hydroxyzine, Quetiapine

## 2023-04-07 NOTE — Progress Notes (Unsigned)
Location:  [facility] Room Number: N055-A Place of Service:  SNF (31) Provider:  Jonte Wollam,ManX,NP  Girtrude Enslin X, NP  Patient Care Team: Monda Chastain X, NP as PCP - General (Internal Medicine)  Extended Emergency Contact Information Primary Emergency Contact: Narda Amber Address: [address]          [place], [place] [address] [name] of [place] Home Phone: [phone] Mobile Phone: [phone] Relation: Relative Secondary Emergency Contact: West,Doug Home Phone: [phone] Mobile Phone: [phone] Relation: Relative  Code Status:  DNR Goals of care: Advanced Directive information    04/07/2023   11:37 AM  Advanced Directives  Does Patient Have a Medical Advance Directive? Yes  Type of Estate agent of [place];Out of facility DNR (pink MOST or yellow form);Living will  Does patient want to make changes to medical advance directive? No - Patient declined  Copy of Healthcare Power of Attorney in Chart? Yes - validated most recent copy scanned in chart (See row information)  Pre-existing out of facility DNR order (yellow form or pink MOST form) Pink MOST form placed in chart (order not valid for inpatient use)     Chief Complaint  Patient presents with   Medical Management of Chronic Issues    Routine visit and discuss covid and flu vaccines.    HPI:  Pt is a 89 y.o. female seen today for medical management of chronic diseases.   Serum Ca 8.6 03/03/23 DEXA 06/20/22 t score -2.597, on Ca, Vit D, Alendronate             Dementia/behavioral issues, takes Depakote, Namenda, off Seroquel. MMSE 10/30 06/27/22             Depression/anxiety, takes Lexpro, Clonazepam(decreased to 0.25mg /0.5mg  hs 11/06/22), Depakote, off Hydroxyzine, Quetiapine  Adult failure to thrive/Weight loss, #6 IBS in the past month CKD stable, Bun/creat 12/1.1 03/03/23 Elevated TSH 6.72 02/15/21>>2.7 05/14/21.  Gait abnormality, furniture or walker,  needs reminder for safety.  GERD, stable, taking Omeprazole. Hgb 13.3 03/03/23 Seroma per surgeon consultation 02/19/22 CT abd/pelvis with fluid collection in the liver at the same site of prior abscess in 2021      Past Medical History:  Diagnosis Date   Arthritis    Dementia (HCC)    Hx of concussion    [facility]   Hyperlipidemia    Vitamin D deficiency, unspecified    Past Surgical History:  Procedure Laterality Date   CHOLECYSTECTOMY      Allergies  Allergen Reactions   Fish-Derived Products    Pravachol [Pravastatin Sodium]     Outpatient Encounter Medications as of 04/07/2023  Medication Sig   alendronate (FOSAMAX) 70 MG tablet Take 70 mg by mouth once a week. Take with a full glass of water on an empty stomach.   Artificial Tears ophthalmic solution Place 3 drops into both eyes 3 (three) times daily. related to Dry eye syndrome of bilateral lacrimal glands   Calcium Carb-Cholecalciferol (CALTRATE 600+D3) 600-20 MG-MCG TABS Take 1 tablet by mouth in the morning and at bedtime.   clonazePAM (KLONOPIN) 0.5 MG tablet Take 0.5 tablets (0.25 mg total) by mouth at bedtime.   divalproex (DEPAKOTE) 125 MG DR tablet Take 125 mg by mouth 2 (two) times daily. * DO NOT CRUSH OR CHEW* *HAZARDOUS DRUG: WEAR GLOVES*   escitalopram (LEXAPRO) 10 MG tablet Take 10 mg by mouth daily.   memantine (NAMENDA) 10 MG tablet Take 10 mg by mouth 2 (two) times  daily.   omeprazole (PRILOSEC) 20 MG capsule Take 20 mg by mouth daily.   No facility-administered encounter medications on file as of 04/07/2023.    Review of Systems  Constitutional:  Positive for unexpected weight change. Negative for appetite change, fatigue and fever.  HENT:  Positive for hearing loss. Negative for congestion and trouble swallowing.   Eyes:  Negative for visual disturbance.  Respiratory:  Negative for cough.   Cardiovascular:  Negative for leg swelling.  Gastrointestinal:  Negative for  abdominal pain and constipation.  Genitourinary:  Negative for dysuria, frequency and urgency.  Musculoskeletal:  Positive for arthralgias and gait problem.       Rails or furniture walking sometimes.   Skin:  Negative for color change.  Neurological:  Negative for speech difficulty, weakness and headaches.       Dementia  Psychiatric/Behavioral:  Positive for confusion. Negative for behavioral problems and sleep disturbance. The patient is not nervous/anxious.        Less emotional, behavioral outbursts     Immunization History  Administered Date(s) Administered   Influenza-Unspecified 02/26/2019, 03/07/2020, 03/13/2021, 03/24/2022   Moderna Covid-19 Vaccine Bivalent Booster 29yrs & up 03/27/2022, 03/11/2023   Moderna Sars-Covid-2 Vaccination 05/28/2019, 06/25/2019, 07/23/2019, 04/03/2020, 10/23/2020, 10/11/2021   PFIZER(Purple Top)SARS-COV-2 Vaccination 02/12/2021   Pneumococcal Conjugate-13 12/31/2018   Pneumococcal Polysaccharide-23 02/24/2004   Pneumococcal-Unspecified 05/22/2016   Respiratory Syncytial Virus Vaccine,Recomb Aduvanted(Arexvy) 06/13/2022   Tdap 10/03/2017, 01/13/2021   Zoster Recombinant(Shingrix) 04/29/2021, 06/26/2021   Zoster, Live 06/12/2022   Pertinent  Health Maintenance Due  Topic Date Due   INFLUENZA VACCINE  12/25/2022   DEXA SCAN  Completed      01/13/2021    6:42 PM 02/19/2022   12:53 AM 06/24/2022    2:30 PM 06/27/2022   11:00 AM 10/28/2022    9:50 AM  Fall Risk  Falls in the past year?   0 0 0  Was there an injury with Fall?   0 0 0  Fall Risk Category Calculator   0  0  (RETIRED) Patient Fall Risk Level Moderate fall risk High fall risk     Patient at Risk for Falls Due to   No Fall Risks No Fall Risks History of fall(s)  Fall risk Follow up   Falls evaluation completed Falls evaluation completed Falls evaluation completed   Functional Status Survey:    Vitals:   04/07/23 1133  BP: 116/75  Pulse: 78  Resp: 16  Temp: (!) 97.1 F (36.2 C)   SpO2: (!) 88%  Weight: 144 lb 1.6 oz (65.4 kg)  Height: 5\' 6"  (1.676 m)   Body mass index is 23.26 kg/m. Physical Exam Vitals and nursing note reviewed.  Constitutional:      Appearance: Normal appearance.  HENT:     Head: Normocephalic and atraumatic.     Mouth/Throat:     Mouth: Mucous membranes are moist.  Eyes:     Extraocular Movements: Extraocular movements intact.     Conjunctiva/sclera: Conjunctivae normal.     Pupils: Pupils are equal, round, and reactive to light.  Cardiovascular:     Rate and Rhythm: Normal rate and regular rhythm.     Heart sounds: No murmur heard. Pulmonary:     Effort: Pulmonary effort is normal.     Breath sounds: No rales.  Abdominal:     General: Bowel sounds are normal.     Palpations: Abdomen is soft.     Tenderness: There is no abdominal tenderness.  Musculoskeletal:  Right lower leg: No edema.     Left lower leg: No edema.  Skin:    General: Skin is warm and dry.  Neurological:     General: No focal deficit present.     Mental Status: She is alert. Mental status is at baseline.     Gait: Gait abnormal.     Comments: Oriented to person  Psychiatric:     Comments: Confused, pleasant, conversing.      Labs reviewed: Recent Labs    09/02/22 0000 03/03/23 0000  NA 135* 139  K 4.1 4.2  CL 98* 101  CO2 31* 29*  BUN 14 12  CREATININE 1.1 1.1  CALCIUM 8.8 8.6*   Recent Labs    09/02/22 0000  AST 11*  ALT 5*  ALKPHOS 71  ALBUMIN 3.7   Recent Labs    09/02/22 0000 03/03/23 0000  WBC 6.3 8.0  NEUTROABS 4,108.00 6,064.00  HGB 13.1 13.3  HCT 40 42  PLT 232 293   Lab Results  Component Value Date   TSH 2.70 05/15/2021   No results found for: "HGBA1C" No results found for: "CHOL", "HDL", "LDLCALC", "LDLDIRECT", "TRIG", "CHOLHDL"  Significant Diagnostic Results in last 30 days:  No results found.  Assessment/Plan Weight loss #6 IBS in the past month, denied GI symptoms, f/u dietary recommendation, obtain  TSH, weekly weight x4  CKD (chronic kidney disease) stage 3, GFR 30-59 ml/min (HCC) stable, Bun/creat 12/1.1 03/03/23  Unsteady gait furniture or walker, needs reminder for safety.   GERD (gastroesophageal reflux disease) stable, taking Omeprazole. Hgb 13.3 03/03/23  Depression with anxiety  takes Lexpro, Clonazepam(decreased to 0.25mg /0.5mg  hs 11/06/22), Depakote, off Hydroxyzine, Quetiapine  Senile dementia (HCC)  takes Depakote, Namenda, off Seroquel. MMSE 10/30 06/27/22  Osteoporosis 06/20/22 t score -2.597, on Ca, Vit D, Alendronate  Adult failure to thrive Continue supportive care, hospice referral    Family/ staff Communication: plan of care reviewed with the patient and charge nurse  Labs/tests ordered:  TSH  30 minutes.

## 2023-04-07 NOTE — Assessment & Plan Note (Signed)
Continue supportive care, hospice referral

## 2023-04-07 NOTE — Assessment & Plan Note (Signed)
stable, taking Omeprazole. Hgb 13.3 03/03/23

## 2023-04-09 LAB — TSH: TSH: 3.08 (ref 0.41–5.90)

## 2023-04-27 ENCOUNTER — Encounter: Payer: Self-pay | Admitting: Nurse Practitioner

## 2023-04-27 ENCOUNTER — Non-Acute Institutional Stay (SKILLED_NURSING_FACILITY): Payer: Medicare Other | Admitting: Nurse Practitioner

## 2023-04-27 DIAGNOSIS — N1831 Chronic kidney disease, stage 3a: Secondary | ICD-10-CM | POA: Diagnosis not present

## 2023-04-27 DIAGNOSIS — K219 Gastro-esophageal reflux disease without esophagitis: Secondary | ICD-10-CM | POA: Diagnosis not present

## 2023-04-27 DIAGNOSIS — F418 Other specified anxiety disorders: Secondary | ICD-10-CM

## 2023-04-27 DIAGNOSIS — R2681 Unsteadiness on feet: Secondary | ICD-10-CM | POA: Diagnosis not present

## 2023-04-27 DIAGNOSIS — F0390 Unspecified dementia without behavioral disturbance: Secondary | ICD-10-CM

## 2023-04-27 NOTE — Assessment & Plan Note (Signed)
furniture or walker, needs reminder for safety. risk for falling, last fall 04/23/23 when the patient was found sitting on the floor in her room, w/o injury.

## 2023-04-27 NOTE — Progress Notes (Unsigned)
Location:   [facility] Room Number: [id number] Place of Service:  SNF (31) Provider: Arna Snipe Deane Melick NP  Makaelyn Aponte X, NP  Patient Care Team: Kamarion Zagami X, NP as PCP - General (Internal Medicine)  Extended Emergency Contact Information Primary Emergency Contact: Narda Amber Address: [address]          [place], [place] [address] [name] of [place] Home Phone: [phone] Mobile Phone: [phone] Relation: Relative Secondary Emergency Contact: West,Doug Home Phone: [phone] Mobile Phone: [phone] Relation: Relative  Code Status:  DNR Goals of care: Advanced Directive information    04/07/2023   11:37 AM  Advanced Directives  Does Patient Have a Medical Advance Directive? Yes  Type of Estate agent of [place];Out of facility DNR (pink MOST or yellow form);Living will  Does patient want to make changes to medical advance directive? No - Patient declined  Copy of Healthcare Power of Attorney in Chart? Yes - validated most recent copy scanned in chart (See row information)  Pre-existing out of facility DNR order (yellow form or pink MOST form) Pink MOST form placed in chart (order not valid for inpatient use)     Chief Complaint  Patient presents with  . Medical Management of Chronic Issues    HPI:  Pt is a 89 y.o. female seen today for medical management of chronic diseases.   Serum Ca 8.6 03/03/23 DEXA 06/20/22 t score -2.597, on Ca, Vit D, Alendronate             Dementia/behavioral issues, takes Depakote, Namenda, off Seroquel. MMSE 10/30 06/27/22             Depression/anxiety, takes Lexpro, Clonazepam(decreased to 0.25mg /0.5mg  hs 11/06/22), Depakote, off Hydroxyzine, Quetiapine             Adult failure to thrive/Weight loss, gradual weight loss.  CKD stable, Bun/creat 12/1.1 03/03/23 Elevated TSH 6.72 02/15/21>>2.7 05/14/21.  Gait abnormality, furniture or walker, needs reminder for safety. risk for falling, last fall  04/23/23 when the patient was found sitting on the floor in her room, w/o injury.  GERD, stable, taking Omeprazole. Hgb 13.3 03/03/23 Seroma per surgeon consultation 02/19/22 CT abd/pelvis with fluid collection in the liver at the same site of prior abscess in 2021    Past Medical History:  Diagnosis Date  . Arthritis   . Dementia (HCC)   . Hx of concussion    [facility]  . Hyperlipidemia   . Vitamin D deficiency, unspecified    Past Surgical History:  Procedure Laterality Date  . CHOLECYSTECTOMY      Allergies  Allergen Reactions  . Fish-Derived Products   . Pravachol [Pravastatin Sodium]     Allergies as of 04/27/2023       Reactions   Fish-derived Products    Pravachol [pravastatin Sodium]         Medication List        Accurate as of April 27, 2023  4:45 PM. If you have any questions, ask your nurse or doctor.          alendronate 70 MG tablet Commonly known as: FOSAMAX Take 70 mg by mouth once a week. Take with a full glass of water on an empty stomach.   Artificial Tears ophthalmic solution Place 3 drops into both eyes 3 (three) times daily. related to Dry eye syndrome of bilateral lacrimal glands   Caltrate 600+D3 600-20 MG-MCG Tabs Generic drug: Calcium Carb-Cholecalciferol Take 1 tablet by mouth in  the morning and at bedtime.   clonazePAM 0.5 MG tablet Commonly known as: KLONOPIN Take 0.5 tablets (0.25 mg total) by mouth at bedtime.   divalproex 125 MG DR tablet Commonly known as: DEPAKOTE Take 125 mg by mouth 2 (two) times daily. * DO NOT CRUSH OR CHEW* *HAZARDOUS DRUG: WEAR GLOVES*   escitalopram 10 MG tablet Commonly known as: LEXAPRO Take 10 mg by mouth daily.   memantine 10 MG tablet Commonly known as: NAMENDA Take 10 mg by mouth 2 (two) times daily.   omeprazole 20 MG capsule Commonly known as: PRILOSEC Take 20 mg by mouth daily.        Review of Systems  Constitutional:  Positive for unexpected weight  change. Negative for appetite change, fatigue and fever.  HENT:  Positive for hearing loss. Negative for congestion and trouble swallowing.   Eyes:  Negative for visual disturbance.  Respiratory:  Negative for cough.   Cardiovascular:  Negative for leg swelling.  Gastrointestinal:  Negative for abdominal pain and constipation.  Genitourinary:  Negative for dysuria, frequency and urgency.  Musculoskeletal:  Positive for arthralgias and gait problem.       Rails or furniture walking sometimes.   Skin:  Negative for color change.  Neurological:  Negative for speech difficulty, weakness and headaches.       Dementia  Psychiatric/Behavioral:  Positive for confusion. Negative for behavioral problems and sleep disturbance. The patient is not nervous/anxious.        Less emotional, behavioral outbursts     Immunization History  Administered Date(s) Administered  . Influenza-Unspecified 02/26/2019, 03/07/2020, 03/13/2021, 03/24/2022  . Moderna Covid-19 Vaccine Bivalent Booster 47yrs & up 03/27/2022, 03/11/2023  . Moderna Sars-Covid-2 Vaccination 05/28/2019, 06/25/2019, 07/23/2019, 04/03/2020, 10/23/2020, 10/11/2021  . PFIZER(Purple Top)SARS-COV-2 Vaccination 02/12/2021  . Pneumococcal Conjugate-13 12/31/2018  . Pneumococcal Polysaccharide-23 02/24/2004  . Pneumococcal-Unspecified 05/22/2016  . Respiratory Syncytial Virus Vaccine,Recomb Aduvanted(Arexvy) 06/13/2022  . Tdap 10/03/2017, 01/13/2021  . Zoster Recombinant(Shingrix) 04/29/2021, 06/26/2021  . Zoster, Live 06/12/2022   Pertinent  Health Maintenance Due  Topic Date Due  . INFLUENZA VACCINE  12/25/2022  . DEXA SCAN  Completed      01/13/2021    6:42 PM 02/19/2022   12:53 AM 06/24/2022    2:30 PM 06/27/2022   11:00 AM 10/28/2022    9:50 AM  Fall Risk  Falls in the past year?   0 0 0  Was there an injury with Fall?   0 0 0  Fall Risk Category Calculator   0  0  (RETIRED) Patient Fall Risk Level Moderate fall risk High fall risk      Patient at Risk for Falls Due to   No Fall Risks No Fall Risks History of fall(s)  Fall risk Follow up   Falls evaluation completed Falls evaluation completed Falls evaluation completed   Functional Status Survey:    Vitals:   04/27/23 1644  BP: 112/69  Pulse: 84  Resp: 18  Temp: 97.8 F (36.6 C)  SpO2: 98%   There is no height or weight on file to calculate BMI. Physical Exam Vitals and nursing note reviewed.  Constitutional:      Appearance: Normal appearance.  HENT:     Head: Normocephalic and atraumatic.     Mouth/Throat:     Mouth: Mucous membranes are moist.  Eyes:     Extraocular Movements: Extraocular movements intact.     Conjunctiva/sclera: Conjunctivae normal.     Pupils: Pupils are equal, round, and reactive  to light.  Cardiovascular:     Rate and Rhythm: Normal rate and regular rhythm.     Heart sounds: No murmur heard. Pulmonary:     Effort: Pulmonary effort is normal.     Breath sounds: No rales.  Abdominal:     General: Bowel sounds are normal.     Palpations: Abdomen is soft.     Tenderness: There is no abdominal tenderness.  Musculoskeletal:     Right lower leg: No edema.     Left lower leg: No edema.  Skin:    General: Skin is warm and dry.  Neurological:     General: No focal deficit present.     Mental Status: She is alert. Mental status is at baseline.     Gait: Gait abnormal.     Comments: Oriented to person  Psychiatric:     Comments: Confused, pleasant, conversing.     Labs reviewed: Recent Labs    09/02/22 0000 03/03/23 0000  NA 135* 139  K 4.1 4.2  CL 98* 101  CO2 31* 29*  BUN 14 12  CREATININE 1.1 1.1  CALCIUM 8.8 8.6*   Recent Labs    09/02/22 0000  AST 11*  ALT 5*  ALKPHOS 71  ALBUMIN 3.7   Recent Labs    09/02/22 0000 03/03/23 0000  WBC 6.3 8.0  NEUTROABS 4,108.00 6,064.00  HGB 13.1 13.3  HCT 40 42  PLT 232 293   Lab Results  Component Value Date   TSH 2.70 05/15/2021   No results found for:  "HGBA1C" No results found for: "CHOL", "HDL", "LDLCALC", "LDLDIRECT", "TRIG", "CHOLHDL"  Significant Diagnostic Results in last 30 days:  No results found.  Assessment/Plan  No problem-specific Assessment & Plan notes found for this encounter.   Family/ staff Communication: plan of care reviewed with the patient and charge nurse.   Labs/tests ordered:  none  Time spend 30 minutes.

## 2023-04-27 NOTE — Assessment & Plan Note (Signed)
Her mood/behaviors are managed, takes Lexpro, Clonazepam(decreased to 0.25mg /0.5mg  hs 11/06/22), Depakote, off Hydroxyzine, Quetiapine

## 2023-04-27 NOTE — Assessment & Plan Note (Signed)
DEXA 06/20/22 t score -2.597, on Ca, Vit D, Alendronate

## 2023-04-27 NOTE — Assessment & Plan Note (Signed)
Adult failure to thrive/Weight loss, gradual weight loss. takes Depakote, Namenda, off Seroquel. MMSE 10/30 06/27/22

## 2023-04-27 NOTE — Assessment & Plan Note (Signed)
 stable, Bun/creat 12/1.1 03/03/23

## 2023-04-27 NOTE — Assessment & Plan Note (Signed)
 stable, taking Omeprazole. Hgb 13.3 03/03/23

## 2023-05-04 ENCOUNTER — Encounter: Payer: Self-pay | Admitting: Nurse Practitioner

## 2023-05-04 ENCOUNTER — Non-Acute Institutional Stay (SKILLED_NURSING_FACILITY): Payer: Medicare Other | Admitting: Nurse Practitioner

## 2023-05-04 DIAGNOSIS — W19XXXA Unspecified fall, initial encounter: Secondary | ICD-10-CM

## 2023-05-04 DIAGNOSIS — K219 Gastro-esophageal reflux disease without esophagitis: Secondary | ICD-10-CM

## 2023-05-04 DIAGNOSIS — Z66 Do not resuscitate: Secondary | ICD-10-CM

## 2023-05-04 DIAGNOSIS — R627 Adult failure to thrive: Secondary | ICD-10-CM

## 2023-05-04 DIAGNOSIS — T148XXA Other injury of unspecified body region, initial encounter: Secondary | ICD-10-CM | POA: Insufficient documentation

## 2023-05-04 DIAGNOSIS — N1831 Chronic kidney disease, stage 3a: Secondary | ICD-10-CM

## 2023-05-04 DIAGNOSIS — F0390 Unspecified dementia without behavioral disturbance: Secondary | ICD-10-CM | POA: Diagnosis not present

## 2023-05-04 DIAGNOSIS — F418 Other specified anxiety disorders: Secondary | ICD-10-CM

## 2023-05-04 DIAGNOSIS — M810 Age-related osteoporosis without current pathological fracture: Secondary | ICD-10-CM

## 2023-05-04 NOTE — Assessment & Plan Note (Signed)
,   stable, taking Omeprazole. Hgb 13.3 03/03/23

## 2023-05-04 NOTE — Assessment & Plan Note (Signed)
Depression/anxiety, takes Lexpro, Clonazepam(decreased to 0.25mg /0.5mg  hs 11/06/22), Depakote, off Hydroxyzine, Quetiapine

## 2023-05-04 NOTE — Assessment & Plan Note (Signed)
 stable, Bun/creat 12/1.1 03/03/23

## 2023-05-04 NOTE — Assessment & Plan Note (Signed)
Serum Ca 8.6 03/03/23 DEXA 06/20/22 t score -2.597, on Ca, Vit D, Alendronate

## 2023-05-04 NOTE — Assessment & Plan Note (Signed)
Dementia/behavioral issues, takes Depakote, Namenda, off Seroquel. MMSE 10/30 06/27/22

## 2023-05-04 NOTE — Assessment & Plan Note (Signed)
Gait abnormality, furniture or walker, needs reminder for safety. risk for falling

## 2023-05-04 NOTE — Progress Notes (Unsigned)
Location:  [facility] Room Number: [id number]-A Place of Service:  SNF (31) Provider:  Glyn Ade NP  Titus Drone X, NP  Patient Care Team: Durwin Davisson X, NP as PCP - General (Internal Medicine)  Extended Emergency Contact Information Primary Emergency Contact: Narda Amber Address: [address]          [place], [place] [address] [name] of [place] Home Phone: [phone] Mobile Phone: [phone] Relation: Relative Secondary Emergency Contact: West,Doug Home Phone: [phone] Mobile Phone: [phone] Relation: Relative  Code Status:  DNR Goals of care: Advanced Directive information    05/04/2023   12:50 PM  Advanced Directives  Does Patient Have a Medical Advance Directive? Yes  Type of Estate agent of [place];Out of facility DNR (pink MOST or yellow form);Living will  Does patient want to make changes to medical advance directive? No - Patient declined  Copy of Healthcare Power of Attorney in Chart? Yes - validated most recent copy scanned in chart (See row information)  Pre-existing out of facility DNR order (yellow form or pink MOST form) Pink MOST form placed in chart (order not valid for inpatient use)     Chief Complaint  Patient presents with  . Acute Visit    Fall and abrasion on right forehead and left wrist    HPI:  Pt is a 89 y.o. female seen today for an acute visit for skin abrasions right forehead, bridge of nose, and left wrist sustained from fall, no active bleeding or s/s of infection. No focal neurological deficits noted.  Serum Ca 8.6 03/03/23 DEXA 06/20/22 t score -2.597, on Ca, Vit D, Alendronate             Dementia/behavioral issues, takes Depakote, Namenda, off Seroquel. MMSE 10/30 06/27/22             Depression/anxiety, takes Lexpro, Clonazepam(decreased to 0.25mg /0.5mg  hs 11/06/22), Depakote, off Hydroxyzine, Quetiapine             Adult failure to thrive/Weight loss, gradual weight loss.   CKD stable, Bun/creat 12/1.1 03/03/23 Elevated TSH 6.72 02/15/21>>2.7 05/14/21.  Gait abnormality, furniture or walker, needs reminder for safety. risk for falling GERD, stable, taking Omeprazole. Hgb 13.3 03/03/23 Seroma per surgeon consultation 02/19/22 CT abd/pelvis with fluid collection in the liver at the same site of prior abscess in 2021     Past Medical History:  Diagnosis Date  . Arthritis   . Dementia (HCC)   . Hx of concussion    [facility]  . Hyperlipidemia   . Vitamin D deficiency, unspecified    Past Surgical History:  Procedure Laterality Date  . CHOLECYSTECTOMY      Allergies  Allergen Reactions  . Fish-Derived Products   . Pravachol [Pravastatin Sodium]     Outpatient Encounter Medications as of 05/04/2023  Medication Sig  . alendronate (FOSAMAX) 70 MG tablet Take 70 mg by mouth once a week. Take with a full glass of water on an empty stomach.  . Artificial Tears ophthalmic solution Place 3 drops into both eyes 3 (three) times daily. related to Dry eye syndrome of bilateral lacrimal glands  . Calcium Carb-Cholecalciferol (CALTRATE 600+D3) 600-20 MG-MCG TABS Take 1 tablet by mouth in the morning and at bedtime.  . clonazePAM (KLONOPIN) 0.5 MG tablet Take 0.5 tablets (0.25 mg total) by mouth at bedtime.  . divalproex (DEPAKOTE) 125 MG DR tablet Take 125 mg by mouth 2 (two) times daily. * DO NOT CRUSH OR  CHEW* *HAZARDOUS DRUG: WEAR GLOVES*  . escitalopram (LEXAPRO) 10 MG tablet Take 10 mg by mouth daily.  . memantine (NAMENDA) 10 MG tablet Take 10 mg by mouth 2 (two) times daily.  Marland Kitchen omeprazole (PRILOSEC) 20 MG capsule Take 20 mg by mouth daily.   No facility-administered encounter medications on file as of 05/04/2023.    Review of Systems  Constitutional:  Positive for unexpected weight change. Negative for appetite change, fatigue and fever.  HENT:  Positive for hearing loss. Negative for congestion and trouble swallowing.   Eyes:   Negative for visual disturbance.  Respiratory:  Negative for cough.   Cardiovascular:  Negative for leg swelling.  Gastrointestinal:  Negative for abdominal pain and constipation.  Genitourinary:  Negative for dysuria, frequency and urgency.  Musculoskeletal:  Positive for arthralgias and gait problem.       Rails or furniture walking sometimes.   Skin:  Positive for wound.  Neurological:  Negative for speech difficulty, weakness and headaches.       Dementia  Psychiatric/Behavioral:  Positive for confusion. Negative for behavioral problems and sleep disturbance. The patient is not nervous/anxious.        Less emotional, behavioral outbursts     Immunization History  Administered Date(s) Administered  . Influenza, High Dose Seasonal PF 04/14/2023  . Influenza-Unspecified 02/26/2019, 03/07/2020, 03/13/2021, 03/24/2022  . Moderna Covid-19 Vaccine Bivalent Booster 26yrs & up 03/27/2022, 03/11/2023  . Moderna Sars-Covid-2 Vaccination 05/28/2019, 06/25/2019, 07/23/2019, 04/03/2020, 10/23/2020, 10/11/2021  . PFIZER(Purple Top)SARS-COV-2 Vaccination 02/12/2021  . Pneumococcal Conjugate-13 12/31/2018  . Pneumococcal Polysaccharide-23 02/24/2004  . Pneumococcal-Unspecified 05/22/2016  . Respiratory Syncytial Virus Vaccine,Recomb Aduvanted(Arexvy) 06/13/2022  . Tdap 10/03/2017, 01/13/2021  . Zoster Recombinant(Shingrix) 04/29/2021, 06/26/2021  . Zoster, Live 06/12/2022   Pertinent  Health Maintenance Due  Topic Date Due  . INFLUENZA VACCINE  Completed  . DEXA SCAN  Completed      01/13/2021    6:42 PM 02/19/2022   12:53 AM 06/24/2022    2:30 PM 06/27/2022   11:00 AM 10/28/2022    9:50 AM  Fall Risk  Falls in the past year?   0 0 0  Was there an injury with Fall?   0 0 0  Fall Risk Category Calculator   0  0  (RETIRED) Patient Fall Risk Level Moderate fall risk High fall risk     Patient at Risk for Falls Due to   No Fall Risks No Fall Risks History of fall(s)  Fall risk Follow up    Falls evaluation completed Falls evaluation completed Falls evaluation completed   Functional Status Survey:    Vitals:   05/04/23 1247  BP: (!) 105/56  Pulse: 76  Resp: 18  Temp: (!) 97.5 F (36.4 C)  SpO2: 96%  Weight: 143 lb 9.6 oz (65.1 kg)  Height: 5\' 6"  (1.676 m)   Body mass index is 23.18 kg/m. Physical Exam Vitals and nursing note reviewed.  Constitutional:      Appearance: Normal appearance.  HENT:     Head: Normocephalic and atraumatic.     Mouth/Throat:     Mouth: Mucous membranes are moist.  Eyes:     Extraocular Movements: Extraocular movements intact.     Conjunctiva/sclera: Conjunctivae normal.     Pupils: Pupils are equal, round, and reactive to light.  Cardiovascular:     Rate and Rhythm: Normal rate and regular rhythm.     Heart sounds: No murmur heard. Pulmonary:     Effort: Pulmonary effort is  normal.     Breath sounds: No rales.  Abdominal:     General: Bowel sounds are normal.     Palpations: Abdomen is soft.     Tenderness: There is no abdominal tenderness.  Musculoskeletal:     Right lower leg: No edema.     Left lower leg: No edema.  Skin:    General: Skin is warm and dry.     Findings: Bruising present.     Comments: Right above eyebrow forehead, bridge of now, left wrist skin abrasions, no active bleed or s/s of infection.   Neurological:     General: No focal deficit present.     Mental Status: She is alert. Mental status is at baseline.     Gait: Gait abnormal.     Comments: Oriented to person  Psychiatric:     Comments: Confused, pleasant, conversing.     Labs reviewed: Recent Labs    09/02/22 0000 03/03/23 0000  NA 135* 139  K 4.1 4.2  CL 98* 101  CO2 31* 29*  BUN 14 12  CREATININE 1.1 1.1  CALCIUM 8.8 8.6*   Recent Labs    09/02/22 0000  AST 11*  ALT 5*  ALKPHOS 71  ALBUMIN 3.7   Recent Labs    09/02/22 0000 03/03/23 0000  WBC 6.3 8.0  NEUTROABS 4,108.00 6,064.00  HGB 13.1 13.3  HCT 40 42  PLT 232 293    Lab Results  Component Value Date   TSH 3.08 04/09/2023   No results found for: "HGBA1C" No results found for: "CHOL", "HDL", "LDLCALC", "LDLDIRECT", "TRIG", "CHOLHDL"  Significant Diagnostic Results in last 30 days:  No results found.  Assessment/Plan Skin abrasion skin abrasions right forehead, bridge of nose, and left wrist sustained from fall, no active bleeding or s/s of infection. No focal neurological deficits noted.   Fall Gait abnormality, furniture or walker, needs reminder for safety. risk for falling  Senile dementia (HCC) Dementia/behavioral issues, takes Depakote, Namenda, off Seroquel. MMSE 10/30 06/27/22  Depression with anxiety   Depression/anxiety, takes Lexpro, Clonazepam(decreased to 0.25mg /0.5mg  hs 11/06/22), Depakote, off Hydroxyzine, Quetiapine  Adult failure to thrive  Adult failure to thrive/Weight loss, gradual weight loss.   CKD (chronic kidney disease) stage 3, GFR 30-59 ml/min (HCC)  stable, Bun/creat 12/1.1 03/03/23  GERD (gastroesophageal reflux disease) , stable, taking Omeprazole. Hgb 13.3 03/03/23  Osteoporosis Serum Ca 8.6 03/03/23 DEXA 06/20/22 t score -2.597, on Ca, Vit D, Alendronate     Family/ staff Communication: plan of care reviewed with the patient and charge nurse.   Labs/tests ordered:  none  Time spend 30 minutes.

## 2023-05-04 NOTE — Assessment & Plan Note (Signed)
skin abrasions right forehead, bridge of nose, and left wrist sustained from fall, no active bleeding or s/s of infection. No focal neurological deficits noted.

## 2023-05-04 NOTE — Assessment & Plan Note (Signed)
Adult failure to thrive/Weight loss, gradual weight loss.

## 2023-05-25 ENCOUNTER — Emergency Department (HOSPITAL_COMMUNITY)

## 2023-05-25 ENCOUNTER — Other Ambulatory Visit: Payer: Self-pay

## 2023-05-25 ENCOUNTER — Non-Acute Institutional Stay (SKILLED_NURSING_FACILITY): Payer: Medicare Other | Admitting: Sports Medicine

## 2023-05-25 ENCOUNTER — Encounter: Payer: Self-pay | Admitting: Sports Medicine

## 2023-05-25 ENCOUNTER — Emergency Department (HOSPITAL_COMMUNITY)
Admission: EM | Admit: 2023-05-25 | Discharge: 2023-05-25 | Disposition: A | Discharge status: Skilled Nursing Facility | Attending: Emergency Medicine | Admitting: Emergency Medicine

## 2023-05-25 ENCOUNTER — Encounter (HOSPITAL_COMMUNITY): Payer: Self-pay

## 2023-05-25 DIAGNOSIS — Z515 Encounter for palliative care: Secondary | ICD-10-CM

## 2023-05-25 DIAGNOSIS — R296 Repeated falls: Secondary | ICD-10-CM | POA: Diagnosis not present

## 2023-05-25 DIAGNOSIS — F0390 Unspecified dementia without behavioral disturbance: Secondary | ICD-10-CM | POA: Insufficient documentation

## 2023-05-25 DIAGNOSIS — S062X0D Diffuse traumatic brain injury without loss of consciousness, subsequent encounter: Secondary | ICD-10-CM

## 2023-05-25 DIAGNOSIS — W19XXXA Unspecified fall, initial encounter: Secondary | ICD-10-CM | POA: Insufficient documentation

## 2023-05-25 DIAGNOSIS — S01112A Laceration without foreign body of left eyelid and periocular area, initial encounter: Secondary | ICD-10-CM | POA: Diagnosis not present

## 2023-05-25 DIAGNOSIS — S0993XA Unspecified injury of face, initial encounter: Secondary | ICD-10-CM | POA: Diagnosis present

## 2023-05-25 DIAGNOSIS — S0181XA Laceration without foreign body of other part of head, initial encounter: Secondary | ICD-10-CM

## 2023-05-25 LAB — URINALYSIS, ROUTINE W REFLEX MICROSCOPIC
Bilirubin Urine: NEGATIVE
Glucose, UA: NEGATIVE mg/dL
Hgb urine dipstick: NEGATIVE
Ketones, ur: 5 mg/dL — AB
Nitrite: NEGATIVE
Protein, ur: 30 mg/dL — AB
Specific Gravity, Urine: 1.023 (ref 1.005–1.030)
pH: 6 (ref 5.0–8.0)

## 2023-05-25 MED ORDER — LIDOCAINE-EPINEPHRINE (PF) 2 %-1:200000 IJ SOLN
20.0000 mL | Freq: Once | INTRAMUSCULAR | Status: AC
  Administered 2023-05-25: 20 mL
  Filled 2023-05-25: qty 20

## 2023-05-25 NOTE — ED Notes (Signed)
[facility] contacted for pt ride

## 2023-05-25 NOTE — ED Notes (Signed)
Attempted to call report and got voicemail.

## 2023-05-25 NOTE — Discharge Instructions (Signed)
You were seen today after a fall.  He sustained a laceration.  Your imaging is negative.  Sutures will absorb on their own.  Keep clean and dressed.  Allow Steri-Strips to fall off on their own.  Apply triple antibiotic ointment.

## 2023-05-25 NOTE — Progress Notes (Signed)
Location:  [facility] Room Number: [phone] Place of Service:  SNF (31) Provider:  Dr. Venita Sheffield  PCP: Mast, Man X, NP  Patient Care Team: Mast, Man X, NP as PCP - General (Internal Medicine)  Extended Emergency Contact Information Primary Emergency Contact: Narda Amber Address: [address]          [place], [place] [address] [name] of [place] Home Phone: [phone] Mobile Phone: [phone] Relation: Relative Secondary Emergency Contact: West,Doug Home Phone: [phone] Mobile Phone: [phone] Relation: Relative  Code Status:  DNR Goals of care: Advanced Directive information    05/25/2023   10:20 AM  Advanced Directives  Does Patient Have a Medical Advance Directive? Yes  Type of Estate agent of [place];Out of facility DNR (pink MOST or yellow form);Living will  Does patient want to make changes to medical advance directive? No - Patient declined  Copy of Healthcare Power of Attorney in Chart? Yes - validated most recent copy scanned in chart (See row information)                         NON HOSPICE RELATED VISIT  Chief Complaint  Patient presents with   Acute Visit    ER Follow up    HPI:  Pt is a 89 y.o. female seen today for an acute visit for ER Follow up As per nursing staff patient had unwitnessed fall.  She was found on the floor with face down and sustained a laceration on left side of forehead. Patient was taken to the emergency room.  She had a CT head and neck with no acute pathology.  Patient seen and examined in the living room.  She is sitting in a wheelchair,, seems comfortable and does not appear to be distressed. Patient knows her name, not oriented to time or place.  She cannot remember what she had for lunch.  She is not able to comprehend the conversation Patient denies headache, nausea, vomiting, abdominal pain, dysuria, chest pain, shortness of breath. Complains of  soreness when touching her left forehead.     Past Medical History:  Diagnosis Date   Arthritis    Dementia (HCC)    Hx of concussion    [facility]   Hyperlipidemia    Vitamin D deficiency, unspecified    Past Surgical History:  Procedure Laterality Date   CHOLECYSTECTOMY      Allergies  Allergen Reactions   Fish-Derived Products    Pravachol [Pravastatin Sodium]     Outpatient Encounter Medications as of 05/25/2023  Medication Sig   alendronate (FOSAMAX) 70 MG tablet Take 70 mg by mouth once a week. Take with a full glass of water on an empty stomach.   Artificial Tears ophthalmic solution Place 3 drops into both eyes 3 (three) times daily. related to Dry eye syndrome of bilateral lacrimal glands   Calcium Carb-Cholecalciferol (CALTRATE 600+D3) 600-20 MG-MCG TABS Take 1 tablet by mouth in the morning and at bedtime.   clonazePAM (KLONOPIN) 0.5 MG tablet Take 0.5 tablets (0.25 mg total) by mouth at bedtime.   divalproex (DEPAKOTE) 125 MG DR tablet Take 125 mg by mouth 2 (two) times daily. * DO NOT CRUSH OR CHEW* *HAZARDOUS DRUG: WEAR GLOVES*   escitalopram (LEXAPRO) 10 MG tablet Take 10 mg by mouth daily.   memantine (NAMENDA) 10 MG tablet Take 10 mg by mouth 2 (two) times daily.   omeprazole (PRILOSEC) 20 MG capsule  Take 20 mg by mouth daily.   No facility-administered encounter medications on file as of 05/25/2023.    Review of Systems  Unable to perform ROS: Dementia  Constitutional:  Negative for fever.  HENT:  Negative for sore throat.   Respiratory:  Negative for cough and shortness of breath.   Cardiovascular:  Negative for chest pain and leg swelling.  Gastrointestinal:  Negative for abdominal distention, abdominal pain, blood in stool, constipation, diarrhea and vomiting.  Genitourinary:  Negative for dysuria, hematuria and urgency.  Neurological:  Negative for dizziness.  Psychiatric/Behavioral:  Positive for confusion.      Immunization History  Administered Date(s) Administered   Influenza, High Dose Seasonal PF 04/14/2023   Influenza-Unspecified 02/26/2019, 03/07/2020, 03/13/2021, 03/24/2022   Moderna Covid-19 Vaccine Bivalent Booster 53yrs & up 03/27/2022, 03/11/2023   Moderna Sars-Covid-2 Vaccination 05/28/2019, 06/25/2019, 07/23/2019, 04/03/2020, 10/23/2020, 10/11/2021   PFIZER(Purple Top)SARS-COV-2 Vaccination 02/12/2021   Pneumococcal Conjugate-13 12/31/2018   Pneumococcal Polysaccharide-23 02/24/2004   Pneumococcal-Unspecified 05/22/2016   Respiratory Syncytial Virus Vaccine,Recomb Aduvanted(Arexvy) 06/13/2022   Tdap 10/03/2017, 01/13/2021   Zoster Recombinant(Shingrix) 04/29/2021, 06/26/2021   Zoster, Live 06/12/2022   Pertinent  Health Maintenance Due  Topic Date Due   INFLUENZA VACCINE  Completed   DEXA SCAN  Completed      01/13/2021    6:42 PM 02/19/2022   12:53 AM 06/24/2022    2:30 PM 06/27/2022   11:00 AM 10/28/2022    9:50 AM  Fall Risk  Falls in the past year?   0 0 0  Was there an injury with Fall?   0 0 0  Fall Risk Category Calculator   0  0  (RETIRED) Patient Fall Risk Level Moderate fall risk High fall risk     Patient at Risk for Falls Due to   No Fall Risks No Fall Risks History of fall(s)  Fall risk Follow up   Falls evaluation completed Falls evaluation completed Falls evaluation completed   Functional Status Survey:    Vitals:   05/25/23 1015  BP: 120/71  Pulse: 75  Resp: 18  Temp: (!) 97.5 F (36.4 C)  SpO2: 96%  Weight: 139 lb 8 oz (63.3 kg)  Height: 5\' 6"  (1.676 m)   Body mass index is 22.52 kg/m. Physical Exam Constitutional:      Appearance: Normal appearance.  HENT:     Head: Normocephalic.     Comments: Steri strips on left forehead Cardiovascular:     Rate and Rhythm: Normal rate and regular rhythm.  Pulmonary:     Effort: Pulmonary effort is normal. No respiratory distress.     Breath sounds: Normal breath sounds. No wheezing.  Abdominal:      General: Bowel sounds are normal. There is no distension.     Tenderness: There is no abdominal tenderness. There is no guarding or rebound.     Comments:    Musculoskeletal:        General: No swelling or tenderness.  Neurological:     Mental Status: She is alert. Mental status is at baseline.     Sensory: No sensory deficit.     Motor: No weakness.     Labs reviewed: Recent Labs    09/02/22 0000 03/03/23 0000  NA 135* 139  K 4.1 4.2  CL 98* 101  CO2 31* 29*  BUN 14 12  CREATININE 1.1 1.1  CALCIUM 8.8 8.6*   Recent Labs    09/02/22 0000  AST 11*  ALT 5*  ALKPHOS 71  ALBUMIN 3.7   Recent Labs    09/02/22 0000 03/03/23 0000  WBC 6.3 8.0  NEUTROABS 4,108.00 6,064.00  HGB 13.1 13.3  HCT 40 42  PLT 232 293   Lab Results  Component Value Date   TSH 3.08 04/09/2023   No results found for: "HGBA1C" No results found for: "CHOL", "HDL", "LDLCALC", "LDLDIRECT", "TRIG", "CHOLHDL"  Significant Diagnostic Results in last 30 days:  CT Cervical Spine Wo Contrast Result Date: 05/25/2023 CLINICAL DATA:  unwitnessed fall EXAM: CT CERVICAL SPINE WITHOUT CONTRAST TECHNIQUE: Multidetector CT imaging of the cervical spine was performed without intravenous contrast. Multiplanar CT image reconstructions were also generated. RADIATION DOSE REDUCTION: This exam was performed according to the departmental dose-optimization program which includes automated exposure control, adjustment of the mA and/or kV according to patient size and/or use of iterative reconstruction technique. COMPARISON:  CT cervical spine 05/22/2020 FINDINGS: Alignment: Normal. Skull base and vertebrae: Multilevel moderate severe degenerative changes spine and the C4-C6 levels. Severe osseous neuroforaminal stenosis at the left C5-C6 level. No acute fracture. No aggressive appearing focal osseous lesion or focal pathologic process. Soft tissues and spinal canal: No prevertebral fluid or swelling. No visible canal  hematoma. Upper chest: Biapical pleural/pulmonary scarring. Other: Atherosclerotic plaque of the carotid arteries and vertebral arteries. IMPRESSION: No acute displaced fracture or traumatic listhesis of the cervical spine. Electronically Signed   By: Tish Frederickson M.D.   On: 05/25/2023 03:25   CT Head Wo Contrast Result Date: 05/25/2023 CLINICAL DATA:  Head trauma EXAM: CT HEAD WITHOUT CONTRAST TECHNIQUE: Contiguous axial images were obtained from the base of the skull through the vertex without intravenous contrast. RADIATION DOSE REDUCTION: This exam was performed according to the departmental dose-optimization program which includes automated exposure control, adjustment of the mA and/or kV according to patient size and/or use of iterative reconstruction technique. COMPARISON:  01/13/2021 FINDINGS: Brain: Faint hyperdensity near the frontal horn of the right lateral ventricle, likely mineralization. No mass, hemorrhage or extra-axial collection. Generalized volume loss. Chronic ischemic white matter changes. Vascular: Atherosclerotic calcification of the vertebral and internal carotid arteries at the skull base. No abnormal hyperdensity of the major intracranial arteries or dural venous sinuses. Skull: The visualized skull base, calvarium and extracranial soft tissues are normal. Sinuses/Orbits: No fluid levels or advanced mucosal thickening of the visualized paranasal sinuses. No mastoid or middle ear effusion. Normal orbits. Other: None. IMPRESSION: 1. No acute intracranial abnormality. 2. Faint hyperdensity near the frontal horn of the right lateral ventricle, likely mineralization. 3. Generalized volume loss and chronic ischemic white matter changes. Electronically Signed   By: Deatra Robinson M.D.   On: 05/25/2023 02:15    Assessment/Plan  1. Laceration and contusion of cerebral cortex, left, without loss of consciousness, subsequent encounter (Primary) Patient has Steri-Strips on left  forehead Tenderness on palpation Will monitor for signs of infection Continue with the bacitracin Continue with Tylenol as needed for pain  2. Unwitnessed fall Continue with neurochecks Fall precautions  3. Hospice care patient Continue with supportive care.   Care plan discussed with the nursing staff  30 minTotal time spent for obtaining history,  performing a medically appropriate examination and evaluation, reviewing the tests, ,  documenting clinical information in the electronic or other health record, independently interpreting results ,care coordination (not separately reported)

## 2023-05-25 NOTE — ED Provider Notes (Signed)
[place] EMERGENCY DEPARTMENT AT [facility] Provider Note   CSN: [id number] Arrival date & time: 05/25/23  0015     History  Chief Complaint  Patient presents with   Marletta Lor    Sarah Larsen is a 89 y.o. female.  HPI     This is an 89 year old female with history of dementia who presents following a fall.  Per EMS reports she had an unwitnessed fall.  She sustained a laceration to the left eyebrow.  She at baseline is oriented to herself only per her report.  Patient cannot give me any history.  Not reportedly on any blood thinners.  Level 5 caveat for dementia  Home Medications Prior to Admission medications   Medication Sig Start Date End Date Taking? Authorizing Provider  alendronate (FOSAMAX) 70 MG tablet Take 70 mg by mouth once a week. Take with a full glass of water on an empty stomach.    [provider]  Artificial Tears ophthalmic solution Place 3 drops into both eyes 3 (three) times daily. related to Dry eye syndrome of bilateral lacrimal glands    [provider]  Calcium Carb-Cholecalciferol (CALTRATE 600+D3) 600-20 MG-MCG TABS Take 1 tablet by mouth in the morning and at bedtime.    [provider]  clonazePAM (KLONOPIN) 0.5 MG tablet Take 0.5 tablets (0.25 mg total) by mouth at bedtime. 03/10/23   Medina-Vargas, Monina C, NP  divalproex (DEPAKOTE) 125 MG DR tablet Take 125 mg by mouth 2 (two) times daily. * DO NOT CRUSH OR CHEW* *HAZARDOUS DRUG: WEAR GLOVES*    [provider]  escitalopram (LEXAPRO) 10 MG tablet Take 10 mg by mouth daily.    [provider]  memantine (NAMENDA) 10 MG tablet Take 10 mg by mouth 2 (two) times daily.    [provider]  omeprazole (PRILOSEC) 20 MG capsule Take 20 mg by mouth daily.    [provider]      Allergies    Fish-derived products and Pravachol [pravastatin sodium]    Review of Systems   Review of Systems  Unable to perform ROS: Dementia   Allergic/Immunologic: Immunocompromised state: dem.    Physical Exam Updated Vital Signs BP 115/68 (BP Location: Right Arm)   Pulse 64   Temp 97.6 F (36.4 C) (Oral)   Resp 18   SpO2 92%  Physical Exam Vitals and nursing note reviewed.  Constitutional:      Appearance: She is well-developed. She is not ill-appearing.     Comments: ABCs intact  HENT:     Head: Normocephalic.     Comments: 4 cm laceration adjacent to the left eyebrow laterally, no active bleeding, slight underlying bruising, swelling noted    Mouth/Throat:     Mouth: Mucous membranes are moist.  Eyes:     Pupils: Pupils are equal, round, and reactive to light.  Cardiovascular:     Rate and Rhythm: Normal rate and regular rhythm.     Heart sounds: Normal heart sounds.  Pulmonary:     Effort: Pulmonary effort is normal. No respiratory distress.     Breath sounds: No wheezing.  Abdominal:     Palpations: Abdomen is soft.  Musculoskeletal:     Cervical back: Neck supple.     Comments: Is all 4 extremities, no obvious deformities  Skin:    General: Skin is warm and dry.  Neurological:     Mental Status: She is alert.     Comments: Oriented only to self  Psychiatric:        Mood and Affect: Mood normal.     ED Results / Procedures / Treatments   Labs (all labs ordered are listed, but only abnormal results are displayed) Labs Reviewed  URINALYSIS, ROUTINE W REFLEX MICROSCOPIC - Abnormal; Notable for the following components:      Result Value   APPearance CLOUDY (*)    Ketones, ur 5 (*)    Protein, ur 30 (*)    Leukocytes,Ua TRACE (*)    Bacteria, UA RARE (*)    All other components within normal limits    EKG EKG Interpretation Date/Time:  Monday May 25 2023 00:58:52 EST Ventricular Rate:  80 PR Interval:  203 QRS Duration:  80 QT Interval:  388 QTC Calculation: 448 R Axis:   148  Text Interpretation: Sinus rhythm Probable lateral infarct, age indeterminate Confirmed by Ross Marcus ([id number]) on 05/25/2023 2:44:30 AM  Radiology CT Cervical Spine Wo Contrast Result Date: 05/25/2023 CLINICAL DATA:  unwitnessed fall EXAM: CT CERVICAL SPINE WITHOUT CONTRAST TECHNIQUE: Multidetector CT imaging of the cervical spine was performed without intravenous contrast. Multiplanar CT image reconstructions were also generated. RADIATION DOSE REDUCTION: This exam was performed according to the departmental dose-optimization program which includes automated exposure control, adjustment of the mA and/or kV according to patient size and/or use of iterative reconstruction technique. COMPARISON:  CT cervical spine 05/22/2020 FINDINGS: Alignment: Normal. Skull base and vertebrae: Multilevel moderate severe degenerative changes spine and the C4-C6 levels. Severe osseous neuroforaminal stenosis at the left C5-C6 level. No acute fracture. No aggressive appearing focal osseous lesion or focal pathologic process. Soft tissues and spinal canal: No prevertebral fluid or swelling. No visible canal hematoma. Upper chest: Biapical pleural/pulmonary scarring. Other: Atherosclerotic plaque of the carotid arteries and vertebral arteries. IMPRESSION: No acute displaced fracture or traumatic listhesis of the cervical spine. Electronically Signed   By: Tish Frederickson M.D.   On: 05/25/2023 03:25   CT Head Wo Contrast Result Date: 05/25/2023 CLINICAL DATA:  Head trauma EXAM: CT HEAD WITHOUT CONTRAST TECHNIQUE: Contiguous axial images were obtained from the base of the skull through the vertex without intravenous contrast. RADIATION DOSE REDUCTION: This exam was performed according to the departmental dose-optimization program which includes automated exposure control, adjustment of the mA and/or kV according to patient size and/or use of iterative reconstruction technique. COMPARISON:  01/13/2021 FINDINGS: Brain: Faint hyperdensity near the frontal horn of the right lateral ventricle, likely mineralization. No mass,  hemorrhage or extra-axial collection. Generalized volume loss. Chronic ischemic white matter changes. Vascular: Atherosclerotic calcification of the vertebral and internal carotid arteries at the skull base. No abnormal hyperdensity of the major intracranial arteries or dural venous sinuses. Skull: The visualized skull base, calvarium and extracranial soft tissues are normal. Sinuses/Orbits: No fluid levels or advanced mucosal thickening of the visualized paranasal sinuses. No mastoid or middle ear effusion. Normal orbits. Other: None. IMPRESSION: 1. No acute intracranial abnormality. 2. Faint hyperdensity near the frontal horn of the right lateral ventricle, likely mineralization. 3. Generalized volume loss and chronic ischemic white matter changes. Electronically Signed   By: Deatra Robinson M.D.   On: 05/25/2023 02:15    Procedures .Laceration Repair  Date/Time: 05/25/2023 4:35 AM  Performed by: Shon Baton, MD Authorized by: Shon Baton, MD   Consent:    Consent obtained:  Emergent situation   Consent given by: Urgent consent, patient unable to consent secondary to dementia. Universal protocol:    Patient identity confirmed:  Arm band Anesthesia:    Anesthesia method:  Local infiltration   Local anesthetic:  Lidocaine 2% WITH epi Laceration details:    Location:  Face   Face location:  L eyebrow   Length (cm):  4   Depth (mm):  3 Pre-procedure details:    Preparation:  Patient was prepped and draped in usual sterile fashion Exploration:    Limited defect created (wound extended): no     Wound exploration: wound explored through full range of motion     Contaminated: no   Treatment:    Area cleansed with:  Povidone-iodine   Amount of cleaning:  Standard   Irrigation solution:  Sterile saline   Irrigation method:  Pressure wash   Visualized foreign bodies/material removed: no     Debridement:  None   Undermining:  None Skin repair:    Repair method:  Sutures and  Steri-Strips   Suture size:  5-0   Suture material:  Fast-absorbing gut   Suture technique:  Simple interrupted   Number of sutures:  3   Number of Steri-Strips:  2 Approximation:    Approximation:  Close Repair type:    Repair type:  Simple Post-procedure details:    Dressing:  Open (no dressing)   Procedure completion:  Tolerated     Medications Ordered in ED Medications  lidocaine-EPINEPHrine (XYLOCAINE W/EPI) 2 %-1:200000 (PF) injection 20 mL (20 mLs Infiltration Given 05/25/23 0423)    ED Course/ Medical Decision Making/ A&P                                 Medical Decision Making Amount and/or Complexity of Data Reviewed Labs: ordered. Radiology: ordered.  Risk Prescription drug management.   This patient presents to the ED for concern of fall, this involves an extensive number of treatment options, and is a complaint that carries with it a high risk of complications and morbidity.  I considered the following differential and admission for this acute, potentially life threatening condition.  The differential diagnosis includes traumatic head injury, laceration, long bone injury, neck injury  MDM:    This is a 89 year old female who presents after an unwitnessed fall.  She is nontoxic.  She is reportedly at her baseline.  Only obvious injury is a laceration to the left eyebrow.  CT head neck obtained and showed no evidence of acute traumatic injury.  Urinalysis is reassuring.  EKG without any acute arrhythmia.  Laceration repaired at bedside.  (Labs, imaging, consults)  Labs: I Ordered, and personally interpreted labs.  The pertinent results include: Urinalysis  Imaging Studies ordered: I ordered imaging studies including CT head, cervical spine I independently visualized and interpreted imaging. I agree with the radiologist interpretation  Additional history obtained from EMS.  External records from outside source obtained and reviewed including prior  evaluations  Cardiac Monitoring: The patient was maintained on a cardiac monitor.  If on the cardiac monitor, I personally viewed and interpreted the cardiac monitored which showed an underlying rhythm of: Sinus  Reevaluation: After the interventions noted above, I reevaluated the patient and found that they have :improved  Social Determinants of Health:  lives in nursing  Disposition: Discharge  Co morbidities that complicate the patient evaluation  Past Medical History:  Diagnosis Date   Arthritis    Dementia (HCC)    Hx of concussion    [facility] [place]   Hyperlipidemia  Vitamin D deficiency, unspecified      Medicines Meds ordered this encounter  Medications   lidocaine-EPINEPHrine (XYLOCAINE W/EPI) 2 %-1:200000 (PF) injection 20 mL    I have reviewed the patients home medicines and have made adjustments as needed  Problem List / ED Course: Problem List Items Addressed This Visit       Other   Fall - Primary   Other Visit Diagnoses       Facial laceration, initial encounter                       Final Clinical Impression(s) / ED Diagnoses Final diagnoses:  Fall, initial encounter  Facial laceration, initial encounter    Rx / DC Orders ED Discharge Orders     None         [place], Mayer Masker, MD 05/25/23 [phone]

## 2023-05-25 NOTE — ED Triage Notes (Signed)
Pt arrives EMS from [facility] with reports of unwitnessed fall. Pt reports to EMS head, neck and back pain. Pt unable to verbalize any areas of pain on arrival to ER. Pt oriented to self only, staff reports this is baseline for pt. Pt has laceration above left eye and placed in c-collar by EMS.

## 2023-06-08 ENCOUNTER — Non-Acute Institutional Stay (SKILLED_NURSING_FACILITY): Payer: BC Managed Care – PPO | Admitting: Sports Medicine

## 2023-06-08 DIAGNOSIS — Z515 Encounter for palliative care: Secondary | ICD-10-CM

## 2023-06-08 DIAGNOSIS — K219 Gastro-esophageal reflux disease without esophagitis: Secondary | ICD-10-CM

## 2023-06-08 DIAGNOSIS — F411 Generalized anxiety disorder: Secondary | ICD-10-CM

## 2023-06-08 DIAGNOSIS — G309 Alzheimer's disease, unspecified: Secondary | ICD-10-CM

## 2023-06-08 DIAGNOSIS — F02818 Dementia in other diseases classified elsewhere, unspecified severity, with other behavioral disturbance: Secondary | ICD-10-CM

## 2023-06-08 NOTE — Progress Notes (Signed)
 Provider:  Jackalyn Blazing MD Location:   [facility]   Place of Service:   Skilled care   PCP: Mast, Man X, NP Patient Care Team: Mast, Man X, NP as PCP - General (Internal Medicine)  Extended Emergency Contact Information Primary Emergency Contact: Jeffreys,(POA) Margaret Address: [address]          [place], [place] [address] [place] Home Phone: [phone] Mobile Phone: [phone] Relation: Relative Secondary Emergency Contact: West,Doug Home Phone: [phone] Mobile Phone: [phone] Relation: Relative  Code Status:  Goals of Care: Advanced Directive information    05/25/2023   10:20 AM  Advanced Directives  Does Patient Have a Medical Advance Directive? Yes  Type of Estate Agent of [place];Out of facility DNR (pink MOST or yellow form);Living will  Does patient want to make changes to medical advance directive? No - Patient declined  Copy of Healthcare Power of Attorney in Chart? Yes - validated most recent copy scanned in chart (See row information)                                   HOSPICE RELATED VISIT    No chief complaint on file.   HPI: Patient is a 89 y.o. female seen today for routine visit  Pt knows her name, cannot remember what she  had  for lunch  As per staff pt at times tries to hit the staff  Her behaviors are mostly in the mornings She can feed herself Non ambulatory  She is seen in the living room coloring  Denies joint pains Pt denies chest pain, SOB, abdominal pain, nausea, vomiting, dysuria, bloody or dark stools. Pt needs 1 person assistance with transfers and uses gait belt for transfers     Past Medical History:  Diagnosis Date   Arthritis    Dementia (HCC)    Hx of concussion    [facility]    Hyperlipidemia    Vitamin D deficiency, unspecified    Past Surgical History:  Procedure Laterality Date   CHOLECYSTECTOMY      reports that she has  never smoked. She has never used smokeless tobacco. She reports that she does not drink alcohol and does not use drugs. Social History   Socioeconomic History   Marital status: Single    Spouse name: Not on file   Number of children: Not on file   Years of education: Not on file   Highest education level: Not on file  Occupational History   Not on file  Tobacco Use   Smoking status: Never   Smokeless tobacco: Never  Vaping Use   Vaping status: Never Used  Substance and Sexual Activity   Alcohol use: Never   Drug use: Never   Sexual activity: Never  Other Topics Concern   Not on file  Social History Narrative   Not on file   Social Drivers of Health   Financial Resource Strain: Not on file  Food Insecurity: Not on file  Transportation Needs: Not on file  Physical Activity: Not on file  Stress: Not on file  Social Connections: Not on file  Intimate Partner Violence: Not on file    Functional Status Survey:    Family History  Family history unknown: Yes    Health Maintenance  Topic Date Due   COVID-19 Vaccine (10 - 2024-25 season) 05/06/2023   DTaP/Tdap/Td (3 - Td or  Tdap) 01/14/2031   Pneumonia Vaccine 43+ Years old  Completed   INFLUENZA VACCINE  Completed   DEXA SCAN  Completed   Zoster Vaccines- Shingrix  Completed   HPV VACCINES  Aged Out    Allergies  Allergen Reactions   Fish-Derived Products    Pravachol [Pravastatin Sodium]     Outpatient Encounter Medications as of 06/08/2023  Medication Sig   alendronate (FOSAMAX) 70 MG tablet Take 70 mg by mouth once a week. Take with a full glass of water on an empty stomach.   Artificial Tears ophthalmic solution Place 3 drops into both eyes 3 (three) times daily. related to Dry eye syndrome of bilateral lacrimal glands   Calcium Carb-Cholecalciferol (CALTRATE 600+D3) 600-20 MG-MCG TABS Take 1 tablet by mouth in the morning and at bedtime.   clonazePAM  (KLONOPIN ) 0.5 MG tablet Take 0.5 tablets (0.25 mg total)  by mouth at bedtime.   divalproex  (DEPAKOTE ) 125 MG DR tablet Take 125 mg by mouth 2 (two) times daily. * DO NOT CRUSH OR CHEW* *HAZARDOUS DRUG: WEAR GLOVES*   escitalopram (LEXAPRO) 10 MG tablet Take 10 mg by mouth daily.   memantine  (NAMENDA ) 10 MG tablet Take 10 mg by mouth 2 (two) times daily.   omeprazole (PRILOSEC) 20 MG capsule Take 20 mg by mouth daily.   No facility-administered encounter medications on file as of 06/08/2023.    Review of Systems  Unable to perform ROS: Dementia  Constitutional:  Negative for fever.  Respiratory:  Negative for cough and shortness of breath.   Cardiovascular:  Negative for chest pain.  Gastrointestinal:  Negative for abdominal pain, blood in stool, constipation, diarrhea and nausea.  Genitourinary:  Negative for hematuria.  Neurological:  Negative for dizziness.  Psychiatric/Behavioral:  Positive for agitation and behavioral problems.     There were no vitals filed for this visit. There is no height or weight on file to calculate BMI. Physical Exam Constitutional:      Appearance: Normal appearance.  HENT:     Head: Normocephalic and atraumatic.  Cardiovascular:     Rate and Rhythm: Normal rate and regular rhythm.  Pulmonary:     Effort: Pulmonary effort is normal. No respiratory distress.     Breath sounds: Normal breath sounds. No wheezing.  Abdominal:     General: Bowel sounds are normal. There is no distension.     Tenderness: There is no abdominal tenderness. There is no guarding or rebound.     Comments:    Musculoskeletal:        General: No swelling or tenderness.  Skin:    Comments: Laceration  scabbed and healing well on her left forehead  Neurological:     Mental Status: She is alert. Mental status is at baseline.     Sensory: No sensory deficit.     Motor: No weakness.     Labs reviewed: Basic Metabolic Panel: Recent Labs    09/02/22 0000 03/03/23 0000  NA 135* 139  K 4.1 4.2  CL 98* 101  CO2 31* 29*  BUN 14  12  CREATININE 1.1 1.1  CALCIUM 8.8 8.6*   Liver Function Tests: Recent Labs    09/02/22 0000  AST 11*  ALT 5*  ALKPHOS 71  ALBUMIN 3.7   No results for input(s): LIPASE, AMYLASE in the last 8760 hours. No results for input(s): AMMONIA in the last 8760 hours. CBC: Recent Labs    09/02/22 0000 03/03/23 0000  WBC 6.3 8.0  NEUTROABS 4,108.00  6,064.00  HGB 13.1 13.3  HCT 40 42  PLT 232 293   Cardiac Enzymes: No results for input(s): CKTOTAL, CKMB, CKMBINDEX, TROPONINI in the last 8760 hours. BNP: Invalid input(s): POCBNP No results found for: HGBA1C Lab Results  Component Value Date   TSH 3.08 04/09/2023   Lab Results  Component Value Date   VITAMINB12 466 07/12/2019   No results found for: FOLATE No results found for: IRON, TIBC, FERRITIN  Imaging and Procedures obtained prior to SNF admission: CT Cervical Spine Wo Contrast Result Date: 05/25/2023 CLINICAL DATA:  unwitnessed fall EXAM: CT CERVICAL SPINE WITHOUT CONTRAST TECHNIQUE: Multidetector CT imaging of the cervical spine was performed without intravenous contrast. Multiplanar CT image reconstructions were also generated. RADIATION DOSE REDUCTION: This exam was performed according to the departmental dose-optimization program which includes automated exposure control, adjustment of the mA and/or kV according to patient size and/or use of iterative reconstruction technique. COMPARISON:  CT cervical spine 05/22/2020 FINDINGS: Alignment: Normal. Skull base and vertebrae: Multilevel moderate severe degenerative changes spine and the C4-C6 levels. Severe osseous neuroforaminal stenosis at the left C5-C6 level. No acute fracture. No aggressive appearing focal osseous lesion or focal pathologic process. Soft tissues and spinal canal: No prevertebral fluid or swelling. No visible canal hematoma. Upper chest: Biapical pleural/pulmonary scarring. Other: Atherosclerotic plaque of the carotid arteries and  vertebral arteries. IMPRESSION: No acute displaced fracture or traumatic listhesis of the cervical spine. Electronically Signed   By: Morgane  Naveau M.D.   On: 05/25/2023 03:25   CT Head Wo Contrast Result Date: 05/25/2023 CLINICAL DATA:  Head trauma EXAM: CT HEAD WITHOUT CONTRAST TECHNIQUE: Contiguous axial images were obtained from the base of the skull through the vertex without intravenous contrast. RADIATION DOSE REDUCTION: This exam was performed according to the departmental dose-optimization program which includes automated exposure control, adjustment of the mA and/or kV according to patient size and/or use of iterative reconstruction technique. COMPARISON:  01/13/2021 FINDINGS: Brain: Faint hyperdensity near the frontal horn of the right lateral ventricle, likely mineralization. No mass, hemorrhage or extra-axial collection. Generalized volume loss. Chronic ischemic white matter changes. Vascular: Atherosclerotic calcification of the vertebral and internal carotid arteries at the skull base. No abnormal hyperdensity of the major intracranial arteries or dural venous sinuses. Skull: The visualized skull base, calvarium and extracranial soft tissues are normal. Sinuses/Orbits: No fluid levels or advanced mucosal thickening of the visualized paranasal sinuses. No mastoid or middle ear effusion. Normal orbits. Other: None. IMPRESSION: 1. No acute intracranial abnormality. 2. Faint hyperdensity near the frontal horn of the right lateral ventricle, likely mineralization. 3. Generalized volume loss and chronic ischemic white matter changes. Electronically Signed   By: Franky Stanford M.D.   On: 05/25/2023 02:15    Assessment/Plan Major Neurocognitive disorder  Functional Assessment Staging Scale: 7c - Ambulatory ability is lost (cannot walk without personal assistance).  Cont with namenda    GAD  Pt with intermittent behavioral manifestations Cont with lexapro, depakote , klonopin   GERD  Cont with  omeprazole   Hospice care patient  Cont with supportive care Pt does not seems to be in pain    GERD  On omeprazole     30 min Total time spent for obtaining history,  performing a medically appropriate examination and evaluation, reviewing the tests,   documenting clinical information in the electronic or other health record, independently interpreting results ,care coordination (not separately reported)

## 2023-06-11 ENCOUNTER — Encounter: Payer: Self-pay | Admitting: Sports Medicine

## 2023-06-11 NOTE — Progress Notes (Deleted)
 Sarah Larsen

## 2023-07-03 ENCOUNTER — Encounter: Payer: Self-pay | Admitting: Nurse Practitioner

## 2023-07-03 ENCOUNTER — Non-Acute Institutional Stay (SKILLED_NURSING_FACILITY): Payer: Self-pay | Admitting: Nurse Practitioner

## 2023-07-03 DIAGNOSIS — K219 Gastro-esophageal reflux disease without esophagitis: Secondary | ICD-10-CM

## 2023-07-03 DIAGNOSIS — F418 Other specified anxiety disorders: Secondary | ICD-10-CM

## 2023-07-03 DIAGNOSIS — F0390 Unspecified dementia without behavioral disturbance: Secondary | ICD-10-CM | POA: Diagnosis not present

## 2023-07-03 DIAGNOSIS — R627 Adult failure to thrive: Secondary | ICD-10-CM | POA: Diagnosis not present

## 2023-07-03 DIAGNOSIS — N1831 Chronic kidney disease, stage 3a: Secondary | ICD-10-CM | POA: Diagnosis not present

## 2023-07-03 NOTE — Progress Notes (Signed)
 Location:   [facility] Room Number: 15 Place of Service:  SNF (31) Provider: Larwance Laressa Bolinger NP  Levaughn Puccinelli X, NP  Patient Care Team: Fizza Scales X, NP as PCP - General (Internal Medicine)  Extended Emergency Contact Information Primary Emergency Contact: Sallyann DELTA Quant Address: [address]          [place], [place] [address] [place] Home Phone: [phone] Mobile Phone: [phone] Relation: Relative Secondary Emergency Contact: West,Doug Home Phone: [phone] Mobile Phone: [phone] Relation: Relative  Code Status:  DNR Goals of care: Advanced Directive information    05/25/2023   10:20 AM  Advanced Directives  Does Patient Have a Medical Advance Directive? Yes  Type of Estate Agent of [place];Out of facility DNR (pink MOST or yellow form);Living will  Does patient want to make changes to medical advance directive? No - Patient declined  Copy of Healthcare Power of Attorney in Chart? Yes - validated most recent copy scanned in chart (See row information)     Chief Complaint  Patient presents with   Medical Management of Chronic Issues    HPI:  Pt is a 89 y.o. female seen today for medical management of chronic diseases.    DEXA 06/20/22 t score -2.597, off Ca, Vit D, Alendronate, goal of care is comfort measures.              Dementia/behavioral issues, takes Depakote , Namenda , off Seroquel . MMSE 10/30 06/27/22             Depression/anxiety, takes Lexpro, Clonazepam , Depakote , off Hydroxyzine, Quetiapine , TSH 3.08 04/09/23             Adult failure to thrive/Weight loss, gradual weight loss. Under Hospice service.  CKD stable, Bun/creat 12/1.1 03/03/23 Elevated TSH 3.08 04/09/23, normalized.  Gait abnormality, w/c for mobility.  GERD, stable, taking Omeprazole. Hgb 13.3 03/03/23 Seroma per surgeon consultation 02/19/22 CT abd/pelvis with fluid collection in the liver at the same site of prior abscess in 2021        Past Medical History:  Diagnosis Date   Arthritis    Dementia (HCC)    Hx of concussion    [facility]    Hyperlipidemia    Vitamin D deficiency, unspecified    Past Surgical History:  Procedure Laterality Date   CHOLECYSTECTOMY      Allergies  Allergen Reactions   Fish-Derived Products    Pravachol [Pravastatin Sodium]     Allergies as of 07/03/2023       Reactions   Fish-derived Products    Pravachol [pravastatin Sodium]         Medication List        Accurate as of July 03, 2023 11:59 PM. If you have any questions, ask your nurse or doctor.          alendronate 70 MG tablet Commonly known as: FOSAMAX Take 70 mg by mouth once a week. Take with a full glass of water on an empty stomach.   Artificial Tears ophthalmic solution Place 3 drops into both eyes 3 (three) times daily. related to Dry eye syndrome of bilateral lacrimal glands   Caltrate 600+D3 600-20 MG-MCG Tabs Generic drug: Calcium Carb-Cholecalciferol Take 1 tablet by mouth in the morning and at bedtime.   clonazePAM  0.5 MG tablet Commonly known as: KLONOPIN  Take 0.5 tablets (0.25 mg total) by mouth at bedtime.   divalproex  125 MG DR tablet Commonly known as: DEPAKOTE  Take 125 mg by mouth  2 (two) times daily. * DO NOT CRUSH OR CHEW* *HAZARDOUS DRUG: WEAR GLOVES*   escitalopram 10 MG tablet Commonly known as: LEXAPRO Take 10 mg by mouth daily.   memantine  10 MG tablet Commonly known as: NAMENDA  Take 10 mg by mouth 2 (two) times daily.   omeprazole 20 MG capsule Commonly known as: PRILOSEC Take 20 mg by mouth daily.        Review of Systems  Unable to perform ROS: Dementia    Immunization History  Administered Date(s) Administered   Influenza, High Dose Seasonal PF 04/14/2023   Influenza-Unspecified 02/26/2019, 03/07/2020, 03/13/2021, 03/24/2022   Moderna Covid-19 Vaccine Bivalent Booster 2yrs & up 03/27/2022, 03/11/2023   Moderna Sars-Covid-2  Vaccination 05/28/2019, 06/25/2019, 07/23/2019, 04/03/2020, 10/23/2020, 10/11/2021   PFIZER(Purple Top)SARS-COV-2 Vaccination 02/12/2021   Pneumococcal Conjugate-13 12/31/2018   Pneumococcal Polysaccharide-23 02/24/2004   Pneumococcal-Unspecified 05/22/2016   Respiratory Syncytial Virus Vaccine,Recomb Aduvanted(Arexvy) 06/13/2022   Tdap 10/03/2017, 01/13/2021   Zoster Recombinant(Shingrix) 04/29/2021, 06/26/2021   Zoster, Live 06/12/2022   Pertinent  Health Maintenance Due  Topic Date Due   INFLUENZA VACCINE  Completed   DEXA SCAN  Completed      01/13/2021    6:42 PM 02/19/2022   12:53 AM 06/24/2022    2:30 PM 06/27/2022   11:00 AM 10/28/2022    9:50 AM  Fall Risk  Falls in the past year?   0 0 0  Was there an injury with Fall?   0 0 0  Fall Risk Category Calculator   0  0  (RETIRED) Patient Fall Risk Level Moderate fall risk High fall risk     Patient at Risk for Falls Due to   No Fall Risks No Fall Risks History of fall(s)  Fall risk Follow up   Falls evaluation completed Falls evaluation completed Falls evaluation completed   Functional Status Survey:    Vitals:   07/03/23 1427  BP: 108/71  Pulse: 79  Resp: 16  Temp: (!) 97.4 F (36.3 C)  SpO2: 97%  Weight: 136 lb 14.4 oz (62.1 kg)   Body mass index is 22.1 kg/m. Physical Exam Vitals and nursing note reviewed.  Constitutional:      Appearance: Normal appearance.  HENT:     Head: Normocephalic and atraumatic.     Mouth/Throat:     Mouth: Mucous membranes are moist.  Eyes:     Extraocular Movements: Extraocular movements intact.     Conjunctiva/sclera: Conjunctivae normal.     Pupils: Pupils are equal, round, and reactive to light.  Cardiovascular:     Rate and Rhythm: Normal rate and regular rhythm.     Heart sounds: No murmur heard. Pulmonary:     Effort: Pulmonary effort is normal.     Breath sounds: No rales.  Abdominal:     General: Bowel sounds are normal.     Palpations: Abdomen is soft.      Tenderness: There is no abdominal tenderness.  Musculoskeletal:     Right lower leg: No edema.     Left lower leg: No edema.  Skin:    General: Skin is warm and dry.     Findings: No bruising.  Neurological:     General: No focal deficit present.     Mental Status: She is alert. Mental status is at baseline.     Gait: Gait abnormal.     Comments: Oriented to person W/c for mobility  Psychiatric:     Comments: Confused, pleasant, conversing.  Labs reviewed: Recent Labs    09/02/22 0000 03/03/23 0000  NA 135* 139  K 4.1 4.2  CL 98* 101  CO2 31* 29*  BUN 14 12  CREATININE 1.1 1.1  CALCIUM 8.8 8.6*   Recent Labs    09/02/22 0000  AST 11*  ALT 5*  ALKPHOS 71  ALBUMIN 3.7   Recent Labs    09/02/22 0000 03/03/23 0000  WBC 6.3 8.0  NEUTROABS 4,108.00 6,064.00  HGB 13.1 13.3  HCT 40 42  PLT 232 293   Lab Results  Component Value Date   TSH 3.08 04/09/2023   No results found for: HGBA1C No results found for: CHOL, HDL, LDLCALC, LDLDIRECT, TRIG, CHOLHDL  Significant Diagnostic Results in last 30 days:  No results found.  Assessment/Plan  Senile dementia (HCC) Stable, takes Depakote , Namenda , off Seroquel . MMSE 10/30 06/27/22  Depression with anxiety Stable,  takes Lexpro, Clonazepam , 06/24/23 increased Depakote  to 250mg  bid, off Hydroxyzine, Quetiapine , TSH 3.08 04/09/23  Adult failure to thrive  gradual weight loss. Under Hospice service.   CKD (chronic kidney disease) stage 3, GFR 30-59 ml/min (HCC) stable, Bun/creat 12/1.1 03/03/23   Family/ staff Communication: plan of care reviewed with the patient and charge nurse.   Labs/tests ordered:  none

## 2023-07-06 NOTE — Assessment & Plan Note (Signed)
 stable, taking Omeprazole. Hgb 13.3 03/03/23

## 2023-07-06 NOTE — Assessment & Plan Note (Signed)
 stable, Bun/creat 12/1.1 03/03/23

## 2023-07-06 NOTE — Assessment & Plan Note (Signed)
 gradual weight loss. Under Hospice service.

## 2023-07-06 NOTE — Assessment & Plan Note (Signed)
 Stable,  takes Lexpro, Clonazepam , 06/24/23 increased Depakote  to 250mg  bid, off Hydroxyzine, Quetiapine , TSH 3.08 04/09/23

## 2023-07-06 NOTE — Assessment & Plan Note (Signed)
Stable, takes Depakote, Namenda, off Seroquel. MMSE 10/30 06/27/22

## 2023-07-23 ENCOUNTER — Encounter: Payer: Self-pay | Admitting: Nurse Practitioner

## 2023-07-23 ENCOUNTER — Non-Acute Institutional Stay: Payer: Self-pay | Admitting: Nurse Practitioner

## 2023-07-23 DIAGNOSIS — Z0000 Encounter for general adult medical examination without abnormal findings: Secondary | ICD-10-CM

## 2023-07-23 DIAGNOSIS — Z66 Do not resuscitate: Secondary | ICD-10-CM | POA: Diagnosis not present

## 2023-07-23 NOTE — Progress Notes (Signed)
 Subjective:   Sarah Larsen is a 89 y.o. female who presents for Medicare Annual (Subsequent) preventive examination.  Visit Complete: In person  Patient Medicare AWV questionnaire was completed by the patient on 07/23/23; I have confirmed that all information answered by patient is correct and no changes since this date.  Cardiac Risk Factors include: advanced age (>68men, >71 women);dyslipidemia;hypertension;sedentary lifestyle     Objective:    Today's Vitals   07/23/23 1514  BP: 123/75  Pulse: 78  Temp: (!) 97.4 F (36.3 C)  SpO2: 97%  Weight: 142 lb (64.4 kg)  Height: 5\' 6"  (1.676 m)  PainSc: 0-No pain   Body mass index is 22.92 kg/m.     07/23/2023    3:18 PM 05/25/2023   10:20 AM 05/04/2023   12:50 PM 04/07/2023   11:37 AM 02/10/2023   10:41 AM 12/23/2022   10:06 AM 10/28/2022    9:49 AM  Advanced Directives  Does Patient Have a Medical Advance Directive? Yes Yes Yes Yes Yes Yes Yes  Type of Estate agent of [place];Out of facility DNR (pink MOST or yellow form);Living will Healthcare Power of [place];Out of facility DNR (pink MOST or yellow form);Living will Healthcare Power of [place];Out of facility DNR (pink MOST or yellow form);Living will Healthcare Power of [place];Out of facility DNR (pink MOST or yellow form);Living will Healthcare Power of [place];Out of facility DNR (pink MOST or yellow form);Living will Healthcare Power of [place];Living will;Out of facility DNR (pink MOST or yellow form) Healthcare Power of [place];Living will;Out of facility DNR (pink MOST or yellow form)  Does patient want to make changes to medical advance directive? No - Patient declined No - Patient declined No - Patient declined No - Patient declined Yes (Inpatient - patient defers changing a medical advance directive at this time - Information given) No - Patient declined No - Patient declined  Copy of Healthcare Power of Attorney in Chart? Yes - validated most  recent copy scanned in chart (See row information) Yes - validated most recent copy scanned in chart (See row information) Yes - validated most recent copy scanned in chart (See row information) Yes - validated most recent copy scanned in chart (See row information) Yes - validated most recent copy scanned in chart (See row information) Yes - validated most recent copy scanned in chart (See row information) Yes - validated most recent copy scanned in chart (See row information)  Pre-existing out of facility DNR order (yellow form or pink MOST form) Yellow form placed in chart (order not valid for inpatient use);Pink MOST form placed in chart (order not valid for inpatient use)  Pink MOST form placed in chart (order not valid for inpatient use) Pink MOST form placed in chart (order not valid for inpatient use)       Current Medications (verified) Outpatient Encounter Medications as of 07/23/2023  Medication Sig   Artificial Tears ophthalmic solution Place 3 drops into both eyes 3 (three) times daily. related to Dry eye syndrome of bilateral lacrimal glands   clonazePAM (KLONOPIN) 0.25 MG disintegrating tablet Take 0.25 mg by mouth at bedtime.   divalproex (DEPAKOTE) 125 MG DR tablet Take 250 mg by mouth 2 (two) times daily. * DO NOT CRUSH OR CHEW* *HAZARDOUS DRUG: WEAR GLOVES*   escitalopram (LEXAPRO) 10 MG tablet Take 10 mg by mouth daily.   memantine (NAMENDA) 10 MG tablet Take 10 mg by mouth 2 (two) times daily.   omeprazole (PRILOSEC) 20 MG capsule Take  20 mg by mouth daily.   alendronate (FOSAMAX) 70 MG tablet Take 70 mg by mouth once a week. Take with a full glass of water on an empty stomach.   Calcium Carb-Cholecalciferol (CALTRATE 600+D3) 600-20 MG-MCG TABS Take 1 tablet by mouth in the morning and at bedtime. (Patient not taking: Reported on 07/23/2023)   clonazePAM (KLONOPIN) 0.5 MG tablet Take 0.5 tablets (0.25 mg total) by mouth at bedtime. (Patient not taking: Reported on 07/23/2023)   No  facility-administered encounter medications on file as of 07/23/2023.    Allergies (verified) Fish-derived products and Pravachol [pravastatin sodium]   History: Past Medical History:  Diagnosis Date   Arthritis    Dementia (HCC)    Hx of concussion    [facility]   Hyperlipidemia    Vitamin D deficiency, unspecified    Past Surgical History:  Procedure Laterality Date   CHOLECYSTECTOMY     Family History  Family history unknown: Yes   Social History   Socioeconomic History   Marital status: Single    Spouse name: Not on file   Number of children: Not on file   Years of education: Not on file   Highest education level: Not on file  Occupational History   Not on file  Tobacco Use   Smoking status: Never   Smokeless tobacco: Never  Vaping Use   Vaping status: Never Used  Substance and Sexual Activity   Alcohol use: Never   Drug use: Never   Sexual activity: Never  Other Topics Concern   Not on file  Social History Narrative   Not on file   Social Drivers of Health   Financial Resource Strain: Not on file  Food Insecurity: Not on file  Transportation Needs: Not on file  Physical Activity: Not on file  Stress: Not on file  Social Connections: Not on file    Tobacco Counseling Counseling given: Not Answered   Clinical Intake:  Pre-visit preparation completed: Yes  Pain : No/denies pain Pain Score: 0-No pain     BMI - recorded: 22.53 Nutritional Status: BMI of 19-24  Normal Nutritional Risks: None (FTT) Diabetes: No  How often do you need to have someone help you when you read instructions, pamphlets, or other written materials from your doctor or pharmacy?: 5 - Always What is the last grade level you completed in school?: college  Interpreter Needed?: No  Information entered by :: Brietta Manso Nedra Hai NP   Activities of Daily Living    07/23/2023    3:04 PM  In your present state of health, do you have any difficulty  performing the following activities:  Hearing? 0  Vision? 0  Difficulty concentrating or making decisions? 1  Walking or climbing stairs? 1  Dressing or bathing? 1  Doing errands, shopping? 1  Preparing Food and eating ? N  Using the Toilet? Y  In the past six months, have you accidently leaked urine? Y  Do you have problems with loss of bowel control? Y  Managing your Medications? Y  Managing your Finances? Y  Housekeeping or managing your Housekeeping? Y    Patient Care Team: Ameliya Nicotra X, NP as PCP - General (Internal Medicine)  Indicate any recent Medical Services you may have received from other than [facility] providers in the past year (date may be approximate).     Assessment:   This is a routine wellness examination for [facility].  Hearing/Vision screen No results found.   Goals Addressed  This Visit's Progress    Anxiety Symptoms Monitored and Managed       Evidence-based guidance:  Discuss adherence to medication therapy; assess and manage barriers, such as costs, side effects, feeling little or no improvement, stigma or low motivation.  Discuss past experiences with psychotropic medication, potential side effects and need to continue medication until treatment becomes effective (e.g., 4 to 8 weeks).  Explain the interplay of stress and physical symptoms, as well as the relationship between illness and emotional problems.  Explore complementary and alternative therapy options, such as applied relaxation, mindfulness, meditation, music therapy, aromatherapy, acupuncture or massage.  Prepare patient for antidepressant pharmacologic therapy which may include additional short-term medications until maintenance medications demonstrate therapeutic effect.   Assess response; monitor and manage side effects.  Prepare patient for use of pharmacologic therapy (e.g., anxiolytic, selective serotonin-reuptake inhibitor, serotonin norepinephrine-reuptake inhibitor, tricyclic  antidepressant, antiepileptic, antipsychotic for comorbid depression,   phosphodiesterase-inhibitor for sexual dysfunction, sedative or hypnotic for sleep disturbance); monitor and manage side effects.  Prepare patient for long-term pharmacologic therapy to prevent relapse (e.g., 12 months after symptom improvement).  Promote cognitive behavioral therapy, individualized to severity of symptoms [e.g., nonfacilitated self-help, facilitated (computerized or manual-based) self-help, face-to-face individual treatment].  Encourage patient to develop a self-management plan that identifies and manages lifestyle triggers (e.g., caffeine, stimulants, nicotine, stress), improves sleep, exercise or physical activity based on tolerance.  Prepare patient for a referral to a psychiatrist when patient has a poor response to treatment, atypical presentation or comorbid psychiatric disorder.  Provide frequent follow-up (e.g., every 2 weeks for the first 6 to 8 weeks of treatment and monthly thereafter).  Explore means to support work reintegration, such as modified working hours, peer support or coaching or a community jobs program.   Notes:        Depression Screen    07/23/2023    3:06 PM  PHQ 2/9 Scores  Exception Documentation Medical reason    Fall Risk    10/28/2022    9:50 AM 06/27/2022   11:00 AM 06/24/2022    2:30 PM  Fall Risk   Falls in the past year? 0 0 0  Number falls in past yr: 0  0  Injury with Fall? 0 0 0  Risk for fall due to : History of fall(s) No Fall Risks No Fall Risks  Follow up Falls evaluation completed Falls evaluation completed Falls evaluation completed    MEDICARE RISK AT HOME: Medicare Risk at Home Any stairs in or around the home?: Yes If so, are there any without handrails?: No Home free of loose throw rugs in walkways, pet beds, electrical cords, etc?: Yes Adequate lighting in your home to reduce risk of falls?: Yes Life alert?: No Use of a cane, walker or w/c?:  Yes Grab bars in the bathroom?: Yes Shower chair or bench in shower?: Yes Elevated toilet seat or a handicapped toilet?: Yes  TIMED UP AND GO:  Was the test performed?  No    Cognitive Function:    06/27/2022    1:53 PM 07/30/2021   11:06 AM  MMSE - Mini Mental State Exam  Orientation to time 0 1  Orientation to Place 1 3  Registration 2 3  Attention/ Calculation 0 5  Recall 0 0  Language- name 2 objects 2 2  Language- repeat 1 1  Language- follow 3 step command 3 3  Language- read & follow direction 1 1  Write a sentence 0 1  Copy design  0 1  Total score 10 21        Immunizations Immunization History  Administered Date(s) Administered   Influenza, High Dose Seasonal PF 04/14/2023   Influenza-Unspecified 02/26/2019, 03/07/2020, 03/13/2021, 03/24/2022   Moderna Covid-19 Vaccine Bivalent Booster 68yrs & up 03/27/2022, 03/11/2023   Moderna Sars-Covid-2 Vaccination 05/28/2019, 06/25/2019, 07/23/2019, 04/03/2020, 10/23/2020, 10/11/2021   PFIZER(Purple Top)SARS-COV-2 Vaccination 02/12/2021   Pneumococcal Conjugate-13 12/31/2018   Pneumococcal Polysaccharide-23 02/24/2004   Pneumococcal-Unspecified 05/22/2016   Respiratory Syncytial Virus Vaccine,Recomb Aduvanted(Arexvy) 06/13/2022   Tdap 10/03/2017, 01/13/2021   Zoster Recombinant(Shingrix) 04/29/2021, 06/26/2021   Zoster, Live 06/12/2022    TDAP status: Up to date  Flu Vaccine status: Up to date  Pneumococcal vaccine status: Up to date  Covid-19 vaccine status: Completed vaccines  Qualifies for Shingles Vaccine? Yes   Zostavax completed Yes   Shingrix Completed?: Yes  Screening Tests Health Maintenance  Topic Date Due   COVID-19 Vaccine (10 - 2024-25 season) 05/26/2024 (Originally 05/06/2023)   DTaP/Tdap/Td (3 - Td or Tdap) 01/14/2031   Pneumonia Vaccine 50+ Years old  Completed   INFLUENZA VACCINE  Completed   DEXA SCAN  Completed   Zoster Vaccines- Shingrix  Completed   HPV VACCINES  Aged Out     Health Maintenance  There are no preventive care reminders to display for this patient.   Colorectal cancer screening: No longer required.   Mammogram status: No longer required due to aged out.  Bone Density status: Completed 06/21/23. Results reflect: Bone density results: OSTEOPOROSIS. Repeat every 3 years.  Lung Cancer Screening: (Low Dose CT Chest recommended if Age 17-80 years, 20 pack-year currently smoking OR have quit w/in 15years.) does not qualify.   Lung Cancer Screening Referral: NA  Additional Screening:  Hepatitis C Screening: does not qualify;   Vision Screening: Recommended annual ophthalmology exams for early detection of glaucoma and other disorders of the eye. Is the patient up to date with their annual eye exam?  No  Who is the provider or what is the name of the office in which the patient attends annual eye exams? [facility] will provide if desires.  If pt is not established with a provider, would they like to be referred to a provider to establish care? No .   Dental Screening: Recommended annual dental exams for proper oral hygiene  Diabetic Foot Exam: NA  Community Resource Referral / Chronic Care Management: CRR required this visit?  No   CCM required this visit?  No     Plan:     I have personally reviewed and noted the following in the patient's chart:   Medical and social history Use of alcohol, tobacco or illicit drugs  Current medications and supplements including opioid prescriptions. Patient is not currently taking opioid prescriptions. Functional ability and status Nutritional status Physical activity Advanced directives List of other physicians Hospitalizations, surgeries, and ER visits in previous 12 months Vitals Screenings to include cognitive, depression, and falls Referrals and appointments  In addition, I have reviewed and discussed with patient certain preventive protocols, quality metrics, and best practice  recommendations. A written personalized care plan for preventive services as well as general preventive health recommendations were provided to patient.     Akshaj Besancon X Nichelle Renwick, NP   07/28/2023   After Visit Summary: (In Person-Declined) Patient declined AVS at this time.

## 2023-07-28 ENCOUNTER — Encounter: Payer: Self-pay | Admitting: Nurse Practitioner

## 2023-07-31 ENCOUNTER — Non-Acute Institutional Stay (SKILLED_NURSING_FACILITY): Payer: Self-pay | Admitting: Nurse Practitioner

## 2023-07-31 ENCOUNTER — Encounter: Payer: Self-pay | Admitting: Nurse Practitioner

## 2023-07-31 DIAGNOSIS — F0390 Unspecified dementia without behavioral disturbance: Secondary | ICD-10-CM

## 2023-07-31 DIAGNOSIS — K219 Gastro-esophageal reflux disease without esophagitis: Secondary | ICD-10-CM

## 2023-07-31 DIAGNOSIS — N1831 Chronic kidney disease, stage 3a: Secondary | ICD-10-CM | POA: Diagnosis not present

## 2023-07-31 DIAGNOSIS — R627 Adult failure to thrive: Secondary | ICD-10-CM | POA: Diagnosis not present

## 2023-07-31 DIAGNOSIS — F418 Other specified anxiety disorders: Secondary | ICD-10-CM

## 2023-07-31 NOTE — Assessment & Plan Note (Signed)
 stable, Bun/creat 12/1.1 03/03/23

## 2023-07-31 NOTE — Assessment & Plan Note (Signed)
 stable, taking Omeprazole. Hgb 13.3 03/03/23

## 2023-07-31 NOTE — Assessment & Plan Note (Signed)
 gradual weight loss. Under Hospice service.

## 2023-07-31 NOTE — Assessment & Plan Note (Signed)
 takes Lexpro, Clonazepam, Depakote, off Hydroxyzine, Quetiapine, TSH 3.08 04/09/23

## 2023-07-31 NOTE — Assessment & Plan Note (Signed)
 takes Depakote, Namenda, off Seroquel. MMSE 10/30 06/27/22

## 2023-07-31 NOTE — Progress Notes (Signed)
 Location:   [facility]  Nursing Home Room Number: 37 Place of Service:  SNF (31) Provider: Arna Snipe Weyman Bogdon NP  Saveah Bahar X, NP  Patient Care Team: Shamira Toutant X, NP as PCP - General (Internal Medicine)  Extended Emergency Contact Information Primary Emergency Contact: Narda Amber Address: [address]          [place], [place] [address] [name] of [place] Home Phone: [phone] Mobile Phone: [phone] Relation: Relative Secondary Emergency Contact: West,Doug Home Phone: [phone] Mobile Phone: [phone] Relation: Relative  Code Status:  DNR Goals of care: Advanced Directive information    07/23/2023    3:18 PM  Advanced Directives  Does Patient Have a Medical Advance Directive? Yes  Type of Estate agent of [place];Out of facility DNR (pink MOST or yellow form);Living will  Does patient want to make changes to medical advance directive? No - Patient declined  Copy of Healthcare Power of Attorney in Chart? Yes - validated most recent copy scanned in chart (See row information)  Pre-existing out of facility DNR order (yellow form or pink MOST form) Yellow form placed in chart (order not valid for inpatient use);Pink MOST form placed in chart (order not valid for inpatient use)     Chief Complaint  Patient presents with   Medical Management of Chronic Issues    HPI:  Pt is a 89 y.o. female seen today for medical management of chronic diseases.   DEXA 06/20/22 t score -2.597, off Ca, Vit D, Alendronate, goal of care is comfort measures.              Dementia/behavioral issues, takes Depakote, Namenda, off Seroquel. MMSE 10/30 06/27/22             Depression/anxiety, takes Lexpro, Clonazepam, Depakote, off Hydroxyzine, Quetiapine, TSH 3.08 04/09/23             Adult failure to thrive/Weight loss, gradual weight loss. Under Hospice service.  CKD stable, Bun/creat 12/1.1 03/03/23 Elevated TSH 3.08 04/09/23, normalized.  Gait abnormality,  w/c for mobility.  GERD, stable, taking Omeprazole. Hgb 13.3 03/03/23 Seroma per surgeon consultation 02/19/22 CT abd/pelvis with fluid collection in the liver at the same site of prior abscess in 2021                  Past Medical History:  Diagnosis Date   Arthritis    Dementia (HCC)    Hx of concussion    [facility]   Hyperlipidemia    Vitamin D deficiency, unspecified    Past Surgical History:  Procedure Laterality Date   CHOLECYSTECTOMY      Allergies  Allergen Reactions   Fish-Derived Products    Pravachol [Pravastatin Sodium]     Allergies as of 07/31/2023       Reactions   Fish-derived Products    Pravachol [pravastatin Sodium]         Medication List        Accurate as of July 31, 2023 11:59 PM. If you have any questions, ask your nurse or doctor.          alendronate 70 MG tablet Commonly known as: FOSAMAX Take 70 mg by mouth once a week. Take with a full glass of water on an empty stomach.   Artificial Tears ophthalmic solution Place 3 drops into both eyes 3 (three) times daily. related to Dry eye syndrome of bilateral lacrimal glands   Caltrate 600+D3 600-20 MG-MCG Tabs Generic drug: Calcium Carb-Cholecalciferol  Take 1 tablet by mouth in the morning and at bedtime.   clonazePAM 0.25 MG disintegrating tablet Commonly known as: KLONOPIN Take 0.25 mg by mouth at bedtime.   clonazePAM 0.5 MG tablet Commonly known as: KLONOPIN Take 0.5 tablets (0.25 mg total) by mouth at bedtime.   divalproex 125 MG DR tablet Commonly known as: DEPAKOTE Take 250 mg by mouth 2 (two) times daily. * DO NOT CRUSH OR CHEW* *HAZARDOUS DRUG: WEAR GLOVES*   escitalopram 10 MG tablet Commonly known as: LEXAPRO Take 10 mg by mouth daily.   memantine 10 MG tablet Commonly known as: NAMENDA Take 10 mg by mouth 2 (two) times daily.   omeprazole 20 MG capsule Commonly known as: PRILOSEC Take 20 mg by mouth daily.        Review of  Systems  Immunization History  Administered Date(s) Administered   Influenza, High Dose Seasonal PF 04/14/2023   Influenza-Unspecified 02/26/2019, 03/07/2020, 03/13/2021, 03/24/2022   Moderna Covid-19 Vaccine Bivalent Booster 25yrs & up 03/27/2022, 03/11/2023   Moderna Sars-Covid-2 Vaccination 05/28/2019, 06/25/2019, 07/23/2019, 04/03/2020, 10/23/2020, 10/11/2021   PFIZER(Purple Top)SARS-COV-2 Vaccination 02/12/2021   Pneumococcal Conjugate-13 12/31/2018   Pneumococcal Polysaccharide-23 02/24/2004   Pneumococcal-Unspecified 05/22/2016   Respiratory Syncytial Virus Vaccine,Recomb Aduvanted(Arexvy) 06/13/2022   Tdap 10/03/2017, 01/13/2021   Zoster Recombinant(Shingrix) 04/29/2021, 06/26/2021   Zoster, Live 06/12/2022   Pertinent  Health Maintenance Due  Topic Date Due   INFLUENZA VACCINE  Completed   DEXA SCAN  Completed      01/13/2021    6:42 PM 02/19/2022   12:53 AM 06/24/2022    2:30 PM 06/27/2022   11:00 AM 10/28/2022    9:50 AM  Fall Risk  Falls in the past year?   0 0 0  Was there an injury with Fall?   0 0 0  Fall Risk Category Calculator   0  0  (RETIRED) Patient Fall Risk Level Moderate fall risk High fall risk     Patient at Risk for Falls Due to   No Fall Risks No Fall Risks History of fall(s)  Fall risk Follow up   Falls evaluation completed Falls evaluation completed Falls evaluation completed   Functional Status Survey:    Vitals:   07/31/23 1118  BP: 120/64  Pulse: 64  Resp: 16  Temp: (!) 97.4 F (36.3 C)  SpO2: 97%  Weight: 143 lb 14.4 oz (65.3 kg)   Body mass index is 23.23 kg/m. Physical Exam  Labs reviewed: Recent Labs    09/02/22 0000 03/03/23 0000  NA 135* 139  K 4.1 4.2  CL 98* 101  CO2 31* 29*  BUN 14 12  CREATININE 1.1 1.1  CALCIUM 8.8 8.6*   Recent Labs    09/02/22 0000  AST 11*  ALT 5*  ALKPHOS 71  ALBUMIN 3.7   Recent Labs    09/02/22 0000 03/03/23 0000  WBC 6.3 8.0  NEUTROABS 4,108.00 6,064.00  HGB 13.1 13.3  HCT 40  42  PLT 232 293   Lab Results  Component Value Date   TSH 3.08 04/09/2023   No results found for: "HGBA1C" No results found for: "CHOL", "HDL", "LDLCALC", "LDLDIRECT", "TRIG", "CHOLHDL"  Significant Diagnostic Results in last 30 days:  No results found.  Assessment/Plan  GERD (gastroesophageal reflux disease) stable, taking Omeprazole. Hgb 13.3 03/03/23  CKD (chronic kidney disease) stage 3, GFR 30-59 ml/min (HCC) stable, Bun/creat 12/1.1 03/03/23  Adult failure to thrive gradual weight loss. Under Hospice service.   Depression with  anxiety takes Lexpro, Clonazepam, Depakote, off Hydroxyzine, Quetiapine, TSH 3.08 04/09/23  Senile dementia (HCC) takes Depakote, Namenda, off Seroquel. MMSE 10/30 06/27/22   Family/ staff Communication: plan of care reviewed with the patient and charge nurse.   Labs/tests ordered:  none

## 2023-09-03 ENCOUNTER — Non-Acute Institutional Stay (SKILLED_NURSING_FACILITY): Payer: Self-pay | Admitting: Sports Medicine

## 2023-09-03 DIAGNOSIS — F0390 Unspecified dementia without behavioral disturbance: Secondary | ICD-10-CM

## 2023-09-03 DIAGNOSIS — K219 Gastro-esophageal reflux disease without esophagitis: Secondary | ICD-10-CM

## 2023-09-03 DIAGNOSIS — Z515 Encounter for palliative care: Secondary | ICD-10-CM | POA: Diagnosis not present

## 2023-09-03 DIAGNOSIS — R451 Restlessness and agitation: Secondary | ICD-10-CM

## 2023-09-03 NOTE — Progress Notes (Signed)
 Provider:  Dr. Tye Gall Location:  [facility] Place of Service:   Skilled care   PCP: Mast, Man X, NP Patient Care Team: Mast, Man X, NP as PCP - General (Internal Medicine)  Extended Emergency Contact Information Primary Emergency Contact: Jeffreys,(POA) Margaret Address: [address]          [place], [place] [address] [place] Home Phone: [phone] Mobile Phone: [phone] Relation: Relative Secondary Emergency Contact: West,Doug Home Phone: [phone] Mobile Phone: [phone] Relation: Relative  Goals of Care: Advanced Directive information    07/23/2023    3:18 PM  Advanced Directives  Does Patient Have a Medical Advance Directive? Yes  Type of Estate agent of [place];Out of facility DNR (pink MOST or yellow form);Living will  Does patient want to make changes to medical advance directive? No - Patient declined  Copy of Healthcare Power of Attorney in Chart? Yes - validated most recent copy scanned in chart (See row information)  Pre-existing out of facility DNR order (yellow form or pink MOST form) Yellow form placed in chart (order not valid for inpatient use);Pink MOST form placed in chart (order not valid for inpatient use)      No chief complaint on file.      History of Present Illness  89 yr old F with h/o Dementia, depression, CKD is evaluated for chronic disease management  Pt seen and examined in the living room  She knows her name, does not comprehend  She is verbal but does not comprehend and participate in the conversations As per staff pt gets agitated, refuses care  Needs help with all her ADLS Ambulates with a wheel chair   08/31/2023 08:38 96/63 mmHg (Sitting r/arm) 08/25/2023 22:14 111/75 mmHg 08/25/2023 22:13 111/75 mmHg 08/25/2023 21:50 111/75 mmHg (Sitting l/arm) 08/24/2023 15:04 115/65 mmHg (Sitting l/arm) 08/10/2023 22:19 136/78 mmHg (Lying r/arm) 08/10/2023 14:27  106/65 mmHg 07/28/2023 21:21 120/64 mmHg (Lying l/arm)       Past Medical History:  Diagnosis Date   Arthritis    Dementia (HCC)    Hx of concussion    [facility]    Hyperlipidemia    Vitamin D deficiency, unspecified    Past Surgical History:  Procedure Laterality Date   CHOLECYSTECTOMY      reports that she has never smoked. She has never used smokeless tobacco. She reports that she does not drink alcohol and does not use drugs. Social History   Socioeconomic History   Marital status: Single    Spouse name: Not on file   Number of children: Not on file   Years of education: Not on file   Highest education level: Not on file  Occupational History   Not on file  Tobacco Use   Smoking status: Never   Smokeless tobacco: Never  Vaping Use   Vaping status: Never Used  Substance and Sexual Activity   Alcohol use: Never   Drug use: Never   Sexual activity: Never  Other Topics Concern   Not on file  Social History Narrative   Not on file   Social Drivers of Health   Financial Resource Strain: Not on file  Food Insecurity: Not on file  Transportation Needs: Not on file  Physical Activity: Not on file  Stress: Not on file  Social Connections: Not on file  Intimate Partner Violence: Not on file    Functional Status Survey:    Family History  Family history unknown: Yes  Health Maintenance  Topic Date Due   COVID-19 Vaccine (10 - 2024-25 season) 05/26/2024 (Originally 05/06/2023)   INFLUENZA VACCINE  12/25/2023   DTaP/Tdap/Td (3 - Td or Tdap) 01/14/2031   Pneumonia Vaccine 71+ Years old  Completed   DEXA SCAN  Completed   Zoster Vaccines- Shingrix  Completed   HPV VACCINES  Aged Out   Meningococcal B Vaccine  Aged Out    Allergies  Allergen Reactions   Fish-Derived Products    Pravachol [Pravastatin Sodium]     Outpatient Encounter Medications as of 09/03/2023  Medication Sig   alendronate (FOSAMAX) 70 MG tablet Take 70 mg  by mouth once a week. Take with a full glass of water on an empty stomach.   Artificial Tears ophthalmic solution Place 3 drops into both eyes 3 (three) times daily. related to Dry eye syndrome of bilateral lacrimal glands   Calcium Carb-Cholecalciferol (CALTRATE 600+D3) 600-20 MG-MCG TABS Take 1 tablet by mouth in the morning and at bedtime. (Patient not taking: Reported on 07/23/2023)   clonazePAM  (KLONOPIN ) 0.25 MG disintegrating tablet Take 0.25 mg by mouth at bedtime.   clonazePAM  (KLONOPIN ) 0.5 MG tablet Take 0.5 tablets (0.25 mg total) by mouth at bedtime. (Patient not taking: Reported on 07/23/2023)   divalproex  (DEPAKOTE ) 125 MG DR tablet Take 250 mg by mouth 2 (two) times daily. * DO NOT CRUSH OR CHEW* *HAZARDOUS DRUG: WEAR GLOVES*   escitalopram (LEXAPRO) 10 MG tablet Take 10 mg by mouth daily.   memantine  (NAMENDA ) 10 MG tablet Take 10 mg by mouth 2 (two) times daily.   omeprazole (PRILOSEC) 20 MG capsule Take 20 mg by mouth daily.   No facility-administered encounter medications on file as of 09/03/2023.    Review of Systems  Unable to perform ROS: Dementia  Constitutional:  Negative for fever.  Psychiatric/Behavioral:  Positive for agitation and behavioral problems.    Negative unless indicated in HPI.  There were no vitals filed for this visit. There is no height or weight on file to calculate BMI. BP Readings from Last 3 Encounters:  07/31/23 120/64  07/23/23 123/75  07/03/23 108/71   Wt Readings from Last 3 Encounters:  07/31/23 143 lb 14.4 oz (65.3 kg)  07/23/23 142 lb (64.4 kg)  07/03/23 136 lb 14.4 oz (62.1 kg)   Physical Exam Constitutional:      Appearance: Normal appearance.  HENT:     Head: Normocephalic and atraumatic.  Cardiovascular:     Rate and Rhythm: Normal rate and regular rhythm.  Pulmonary:     Effort: Pulmonary effort is normal. No respiratory distress.     Breath sounds: Normal breath sounds. No wheezing.  Abdominal:     General: Bowel  sounds are normal. There is no distension.     Tenderness: There is no abdominal tenderness. There is no guarding or rebound.     Comments:    Musculoskeletal:        General: No swelling.  Skin:    General: Skin is dry.  Neurological:     Mental Status: She is alert. Mental status is at baseline.     Motor: No weakness.     Labs reviewed: Basic Metabolic Panel: Recent Labs    03/03/23 0000  NA 139  K 4.2  CL 101  CO2 29*  BUN 12  CREATININE 1.1  CALCIUM 8.6*   Liver Function Tests: No results for input(s): "AST", "ALT", "ALKPHOS", "BILITOT", "PROT", "ALBUMIN" in the last 8760 hours. No results for  input(s): "LIPASE", "AMYLASE" in the last 8760 hours. No results for input(s): "AMMONIA" in the last 8760 hours. CBC: Recent Labs    03/03/23 0000  WBC 8.0  NEUTROABS 6,064.00  HGB 13.3  HCT 42  PLT 293   Cardiac Enzymes: No results for input(s): "CKTOTAL", "CKMB", "CKMBINDEX", "TROPONINI" in the last 8760 hours. BNP: Invalid input(s): "POCBNP" No results found for: "HGBA1C" Lab Results  Component Value Date   TSH 3.08 04/09/2023   Lab Results  Component Value Date   VITAMINB12 466 07/12/2019   No results found for: "FOLATE" No results found for: "IRON", "TIBC", "FERRITIN"  Imaging and Procedures obtained prior to SNF admission: CT Cervical Spine Wo Contrast Result Date: 05/25/2023 CLINICAL DATA:  unwitnessed fall EXAM: CT CERVICAL SPINE WITHOUT CONTRAST TECHNIQUE: Multidetector CT imaging of the cervical spine was performed without intravenous contrast. Multiplanar CT image reconstructions were also generated. RADIATION DOSE REDUCTION: This exam was performed according to the departmental dose-optimization program which includes automated exposure control, adjustment of the mA and/or kV according to patient size and/or use of iterative reconstruction technique. COMPARISON:  CT cervical spine 05/22/2020 FINDINGS: Alignment: Normal. Skull base and vertebrae:  Multilevel moderate severe degenerative changes spine and the C4-C6 levels. Severe osseous neuroforaminal stenosis at the left C5-C6 level. No acute fracture. No aggressive appearing focal osseous lesion or focal pathologic process. Soft tissues and spinal canal: No prevertebral fluid or swelling. No visible canal hematoma. Upper chest: Biapical pleural/pulmonary scarring. Other: Atherosclerotic plaque of the carotid arteries and vertebral arteries. IMPRESSION: No acute displaced fracture or traumatic listhesis of the cervical spine. Electronically Signed   By: Morgane  Naveau M.D.   On: 05/25/2023 03:25   CT Head Wo Contrast Result Date: 05/25/2023 CLINICAL DATA:  Head trauma EXAM: CT HEAD WITHOUT CONTRAST TECHNIQUE: Contiguous axial images were obtained from the base of the skull through the vertex without intravenous contrast. RADIATION DOSE REDUCTION: This exam was performed according to the departmental dose-optimization program which includes automated exposure control, adjustment of the mA and/or kV according to patient size and/or use of iterative reconstruction technique. COMPARISON:  01/13/2021 FINDINGS: Brain: Faint hyperdensity near the frontal horn of the right lateral ventricle, likely mineralization. No mass, hemorrhage or extra-axial collection. Generalized volume loss. Chronic ischemic white matter changes. Vascular: Atherosclerotic calcification of the vertebral and internal carotid arteries at the skull base. No abnormal hyperdensity of the major intracranial arteries or dural venous sinuses. Skull: The visualized skull base, calvarium and extracranial soft tissues are normal. Sinuses/Orbits: No fluid levels or advanced mucosal thickening of the visualized paranasal sinuses. No mastoid or middle ear effusion. Normal orbits. Other: None. IMPRESSION: 1. No acute intracranial abnormality. 2. Faint hyperdensity near the frontal horn of the right lateral ventricle, likely mineralization. 3.  Generalized volume loss and chronic ischemic white matter changes. Electronically Signed   By: Juanetta Nordmann M.D.   On: 05/25/2023 02:15    Assessment and Plan Assessment & Plan    1. Hospice care patient (Primary) Pt appears comfortable and does not appear to be in distress  2. Major neurocognitive disorder (HCC) 3. Agitation Cont with supportive care As per staff pt gets agitated and refuses care  Cont with lexapro, depakote , klonipin  4. Gastroesophageal reflux disease, unspecified whether esophagitis present  Cont with omeprazole   30 min Total time spent for obtaining history,  performing a medically appropriate examination and evaluation,   documenting clinical information in the electronic or other health record, independently interpreting results ,care coordination (  not separately reported)

## 2023-09-14 ENCOUNTER — Encounter: Payer: Self-pay | Admitting: Sports Medicine

## 2023-09-23 ENCOUNTER — Encounter: Payer: Self-pay | Admitting: Nurse Practitioner

## 2023-09-23 ENCOUNTER — Non-Acute Institutional Stay (SKILLED_NURSING_FACILITY): Payer: Self-pay | Admitting: Nurse Practitioner

## 2023-09-23 DIAGNOSIS — R627 Adult failure to thrive: Secondary | ICD-10-CM

## 2023-09-23 DIAGNOSIS — F0390 Unspecified dementia without behavioral disturbance: Secondary | ICD-10-CM

## 2023-09-23 DIAGNOSIS — F418 Other specified anxiety disorders: Secondary | ICD-10-CM

## 2023-09-23 NOTE — Assessment & Plan Note (Signed)
 Stable, takes Lexpro, Clonazepam , Depakote , off Hydroxyzine, Quetiapine , TSH 3.08 04/09/23

## 2023-09-23 NOTE — Assessment & Plan Note (Signed)
 gradual weight loss. Under Hospice service.

## 2023-09-23 NOTE — Progress Notes (Signed)
 Location:   [facility] Room Number: 51 Place of Service:  SNF (31) Provider: Abner Hoffman Wynetta Seith NP  Bellah Alia X, NP  Patient Care Team: Modena Bellemare X, NP as PCP - General (Internal Medicine)  Extended Emergency Contact Information Primary Emergency Contact: Byron Castellani Address: [address]          [place], [place] [address] [place] Home Phone: [phone] Mobile Phone: [phone] Relation: Relative Secondary Emergency Contact: West,Doug Home Phone: [phone] Mobile Phone: [phone] Relation: Relative  Code Status:  DNR Goals of care: Advanced Directive information    07/23/2023    3:18 PM  Advanced Directives  Does Patient Have a Medical Advance Directive? Yes  Type of Estate agent of [place];Out of facility DNR (pink MOST or yellow form);Living will  Does patient want to make changes to medical advance directive? No - Patient declined  Copy of Healthcare Power of Attorney in Chart? Yes - validated most recent copy scanned in chart (See row information)  Pre-existing out of facility DNR order (yellow form or pink MOST form) Yellow form placed in chart (order not valid for inpatient use);Pink MOST form placed in chart (order not valid for inpatient use)     Chief Complaint  Patient presents with  . Medical Management of Chronic Issues    HPI:  Pt is a 89 y.o. female seen today for medical management of chronic diseases.     DEXA 06/20/22 t score -2.597, off Ca, Vit D, Alendronate, goal of care is comfort measures.              Dementia/behavioral issues, takes Depakote , Namenda , off Seroquel . MMSE 10/30 06/27/22             Depression/anxiety, intermittent agitations,  takes Lexpro, Clonazepam , Depakote , off Hydroxyzine, Quetiapine , TSH 3.08 04/09/23             Adult failure to thrive/Weight loss, gradual weight loss. Under Hospice service.  CKD stable, Bun/creat 12/1.1 03/03/23 Elevated TSH 3.08 04/09/23,  normalized.  Gait abnormality, w/c for mobility.  GERD, stable, taking Omeprazole. Hgb 13.3 03/03/23 Seroma per surgeon consultation 02/19/22 CT abd/pelvis with fluid collection in the liver at the same site of prior abscess in 2021                   Past Medical History:  Diagnosis Date  . Arthritis   . Dementia (HCC)   . Hx of concussion    [facility]   . Hyperlipidemia   . Vitamin D deficiency, unspecified    Past Surgical History:  Procedure Laterality Date  . CHOLECYSTECTOMY      Allergies  Allergen Reactions  . Fish-Derived Products   . Pravachol [Pravastatin Sodium]     Allergies as of 09/23/2023       Reactions   Fish-derived Products    Pravachol [pravastatin Sodium]         Medication List        Accurate as of September 23, 2023 11:59 PM. If you have any questions, ask your nurse or doctor.          alendronate 70 MG tablet Commonly known as: FOSAMAX Take 70 mg by mouth once a week. Take with a full glass of water on an empty stomach.   Artificial Tears ophthalmic solution Place 3 drops into both eyes 3 (three) times daily. related to Dry eye syndrome of bilateral lacrimal glands   Caltrate 600+D3 600-20 MG-MCG  Tabs Generic drug: Calcium Carb-Cholecalciferol Take 1 tablet by mouth in the morning and at bedtime.   clonazePAM  0.25 MG disintegrating tablet Commonly known as: KLONOPIN  Take 0.25 mg by mouth at bedtime.   clonazePAM  0.5 MG tablet Commonly known as: KLONOPIN  Take 0.5 tablets (0.25 mg total) by mouth at bedtime.   divalproex  125 MG DR tablet Commonly known as: DEPAKOTE  Take 250 mg by mouth 2 (two) times daily. * DO NOT CRUSH OR CHEW* *HAZARDOUS DRUG: WEAR GLOVES*   escitalopram 10 MG tablet Commonly known as: LEXAPRO Take 10 mg by mouth daily.   memantine  10 MG tablet Commonly known as: NAMENDA  Take 10 mg by mouth 2 (two) times daily.   omeprazole 20 MG capsule Commonly known as: PRILOSEC Take 20 mg by  mouth daily.        Review of Systems  Unable to perform ROS: Dementia    Immunization History  Administered Date(s) Administered  . Influenza, High Dose Seasonal PF 04/14/2023  . Influenza-Unspecified 02/26/2019, 03/07/2020, 03/13/2021, 03/24/2022  . Moderna Covid-19 Vaccine Bivalent Booster 62yrs & up 03/27/2022, 03/11/2023  . Moderna Sars-Covid-2 Vaccination 05/28/2019, 06/25/2019, 07/23/2019, 04/03/2020, 10/23/2020, 10/11/2021  . PFIZER(Purple Top)SARS-COV-2 Vaccination 02/12/2021  . Pneumococcal Conjugate-13 12/31/2018  . Pneumococcal Polysaccharide-23 02/24/2004  . Pneumococcal-Unspecified 05/22/2016  . Respiratory Syncytial Virus Vaccine,Recomb Aduvanted(Arexvy) 06/13/2022  . Tdap 10/03/2017, 01/13/2021  . Zoster Recombinant(Shingrix) 04/29/2021, 06/26/2021  . Zoster, Live 06/12/2022   Pertinent  Health Maintenance Due  Topic Date Due  . INFLUENZA VACCINE  12/25/2023  . DEXA SCAN  Completed      01/13/2021    6:42 PM 02/19/2022   12:53 AM 06/24/2022    2:30 PM 06/27/2022   11:00 AM 10/28/2022    9:50 AM  Fall Risk  Falls in the past year?   0 0 0  Was there an injury with Fall?   0 0 0  Fall Risk Category Calculator   0  0  (RETIRED) Patient Fall Risk Level Moderate fall risk High fall risk     Patient at Risk for Falls Due to   No Fall Risks No Fall Risks History of fall(s)  Fall risk Follow up   Falls evaluation completed Falls evaluation completed Falls evaluation completed   Functional Status Survey:    Vitals:   09/23/23 1245  BP: 119/67  Pulse: 80  Resp: 18  Temp: (!) 97.4 F (36.3 C)  SpO2: 97%  Weight: 139 lb 12.8 oz (63.4 kg)   Body mass index is 22.56 kg/m. Physical Exam Vitals and nursing note reviewed.  Constitutional:      Appearance: Normal appearance.  HENT:     Head: Normocephalic and atraumatic.     Mouth/Throat:     Mouth: Mucous membranes are moist.  Eyes:     Extraocular Movements: Extraocular movements intact.      Conjunctiva/sclera: Conjunctivae normal.     Pupils: Pupils are equal, round, and reactive to light.  Cardiovascular:     Rate and Rhythm: Normal rate and regular rhythm.     Heart sounds: No murmur heard. Pulmonary:     Effort: Pulmonary effort is normal.     Breath sounds: No rales.  Abdominal:     General: Bowel sounds are normal.     Palpations: Abdomen is soft.     Tenderness: There is no abdominal tenderness.  Musculoskeletal:     Right lower leg: No edema.     Left lower leg: No edema.  Skin:  General: Skin is warm and dry.     Findings: No bruising.  Neurological:     General: No focal deficit present.     Mental Status: She is alert. Mental status is at baseline.     Gait: Gait abnormal.     Comments: Oriented to person W/c for mobility  Psychiatric:     Comments: Confused, pleasant, conversing, intermittent agitation    Labs reviewed: Recent Labs    03/03/23 0000  NA 139  K 4.2  CL 101  CO2 29*  BUN 12  CREATININE 1.1  CALCIUM 8.6*   No results for input(s): "AST", "ALT", "ALKPHOS", "BILITOT", "PROT", "ALBUMIN" in the last 8760 hours. Recent Labs    03/03/23 0000  WBC 8.0  NEUTROABS 6,064.00  HGB 13.3  HCT 42  PLT 293   Lab Results  Component Value Date   TSH 3.08 04/09/2023   No results found for: "HGBA1C" No results found for: "CHOL", "HDL", "LDLCALC", "LDLDIRECT", "TRIG", "CHOLHDL"  Significant Diagnostic Results in last 30 days:  No results found.  Assessment/Plan  Depression with anxiety Stable, takes Lexpro, Clonazepam , Depakote , off Hydroxyzine, Quetiapine , TSH 3.08 04/09/23  Adult failure to thrive  gradual weight loss. Under Hospice service.   Senile dementia (HCC)  takes Depakote , Namenda , off Seroquel . MMSE 10/30 06/27/22, supportive care  GERD (gastroesophageal reflux disease) Stable, stable, taking Omeprazole. Hgb 13.3 03/03/23   Family/ staff Communication: plan of care reviewed with the patient and charge nurse.    Labs/tests ordered:  none

## 2023-09-23 NOTE — Assessment & Plan Note (Signed)
 takes Depakote , Namenda , off Seroquel . MMSE 10/30 06/27/22, supportive care

## 2023-09-23 NOTE — Assessment & Plan Note (Signed)
 Stable, stable, taking Omeprazole. Hgb 13.3 03/03/23

## 2023-10-12 ENCOUNTER — Encounter: Payer: Self-pay | Admitting: Nurse Practitioner

## 2023-10-12 ENCOUNTER — Non-Acute Institutional Stay (SKILLED_NURSING_FACILITY): Payer: Self-pay | Admitting: Nurse Practitioner

## 2023-10-12 DIAGNOSIS — K219 Gastro-esophageal reflux disease without esophagitis: Secondary | ICD-10-CM | POA: Diagnosis not present

## 2023-10-12 DIAGNOSIS — R627 Adult failure to thrive: Secondary | ICD-10-CM | POA: Diagnosis not present

## 2023-10-12 DIAGNOSIS — F418 Other specified anxiety disorders: Secondary | ICD-10-CM

## 2023-10-12 DIAGNOSIS — F0390 Unspecified dementia without behavioral disturbance: Secondary | ICD-10-CM | POA: Diagnosis not present

## 2023-10-12 NOTE — Assessment & Plan Note (Signed)
 stable, taking Omeprazole. Hgb 13.3 03/03/23

## 2023-10-12 NOTE — Assessment & Plan Note (Addendum)
 intermittent agitations,  increasing Lexpro 10/5mg  qd, on Clonazepam  0.25mg  qhs, increasing Depakote  250mg  tid po for mood/behaviors,  off Hydroxyzine, Quetiapine , TSH 3.08 04/09/23

## 2023-10-12 NOTE — Assessment & Plan Note (Signed)
 Adult failure to thrive/Weight loss, gradual weight loss. Under Hospice service.

## 2023-10-12 NOTE — Progress Notes (Signed)
 Location:   [facility] Room Number: 50 Place of Service:  SNF (31) Provider: Abner Hoffman Gearald Stonebraker NP  Janean Eischen X, NP  Patient Care Team: Daeveon Zweber X, NP as PCP - General (Internal Medicine)  Extended Emergency Contact Information Primary Emergency Contact: Byron Castellani Address: [address]          [place], [place] [address] [place] Home Phone: [phone] Mobile Phone: [phone] Relation: Relative Secondary Emergency Contact: West,Doug Home Phone: [phone] Mobile Phone: [phone] Relation: Relative  Code Status: DNR Goals of care: Advanced Directive information    07/23/2023    3:18 PM  Advanced Directives  Does Patient Have a Medical Advance Directive? Yes  Type of Estate agent of [place];Out of facility DNR (pink MOST or yellow form);Living will  Does patient want to make changes to medical advance directive? No - Patient declined  Copy of Healthcare Power of Attorney in Chart? Yes - validated most recent copy scanned in chart (See row information)  Pre-existing out of facility DNR order (yellow form or pink MOST form) Yellow form placed in chart (order not valid for inpatient use);Pink MOST form placed in chart (order not valid for inpatient use)     Chief Complaint  Patient presents with   Acute Visit    Intermittent emotional and behavioral outburst episodes.     HPI:  Pt is a 89 y.o. female seen today for an acute visit for intermittent emotional and behavioral outburst episodes, not new, otherwise the patient is in her usual state of health.    DEXA 06/20/22 t score -2.597, off Ca, Vit D, Alendronate, goal of care is comfort measures.              Dementia/behavioral issues, takes Depakote , Namenda , off Seroquel . MMSE 10/30 06/27/22             Depression/anxiety, intermittent agitations, takes Lexpro, Clonazepam , Depakote , off Hydroxyzine, Quetiapine , TSH 3.08 04/09/23             Adult failure to  thrive/Weight loss, gradual weight loss. Under Hospice service.  CKD stable, Bun/creat 12/1.1 03/03/23 Elevated TSH 3.08 04/09/23, normalized.  Gait abnormality, w/c for mobility.  GERD, stable, taking Omeprazole. Hgb 13.3 03/03/23 Seroma per surgeon consultation 02/19/22 CT abd/pelvis with fluid collection in the liver at the same site of prior abscess in 2021                 Past Medical History:  Diagnosis Date   Arthritis    Dementia (HCC)    Hx of concussion    [facility]    Hyperlipidemia    Vitamin D deficiency, unspecified    Past Surgical History:  Procedure Laterality Date   CHOLECYSTECTOMY      Allergies  Allergen Reactions   Fish-Derived Products    Pravachol [Pravastatin Sodium]     Allergies as of 10/12/2023       Reactions   Fish-derived Products    Pravachol [pravastatin Sodium]         Medication List        Accurate as of Oct 12, 2023 11:59 AM. If you have any questions, ask your nurse or doctor.          alendronate 70 MG tablet Commonly known as: FOSAMAX Take 70 mg by mouth once a week. Take with a full glass of water on an empty stomach.   Artificial Tears ophthalmic solution Place 3 drops into both eyes  3 (three) times daily. related to Dry eye syndrome of bilateral lacrimal glands   Caltrate 600+D3 600-20 MG-MCG Tabs Generic drug: Calcium Carb-Cholecalciferol Take 1 tablet by mouth in the morning and at bedtime.   clonazePAM  0.25 MG disintegrating tablet Commonly known as: KLONOPIN  Take 0.25 mg by mouth at bedtime.   clonazePAM  0.5 MG tablet Commonly known as: KLONOPIN  Take 0.5 tablets (0.25 mg total) by mouth at bedtime.   divalproex  125 MG DR tablet Commonly known as: DEPAKOTE  Take 250 mg by mouth 2 (two) times daily. * DO NOT CRUSH OR CHEW* *HAZARDOUS DRUG: WEAR GLOVES*   escitalopram 10 MG tablet Commonly known as: LEXAPRO Take 10 mg by mouth daily.   memantine  10 MG tablet Commonly known as:  NAMENDA  Take 10 mg by mouth 2 (two) times daily.   omeprazole 20 MG capsule Commonly known as: PRILOSEC Take 20 mg by mouth daily.        Review of Systems  Unable to perform ROS: Dementia    Immunization History  Administered Date(s) Administered   Influenza, High Dose Seasonal PF 04/14/2023   Influenza-Unspecified 02/26/2019, 03/07/2020, 03/13/2021, 03/24/2022   Moderna Covid-19 Vaccine Bivalent Booster 57yrs & up 03/27/2022, 03/11/2023   Moderna Sars-Covid-2 Vaccination 05/28/2019, 06/25/2019, 07/23/2019, 04/03/2020, 10/23/2020, 10/11/2021   PFIZER(Purple Top)SARS-COV-2 Vaccination 02/12/2021   Pneumococcal Conjugate-13 12/31/2018   Pneumococcal Polysaccharide-23 02/24/2004   Pneumococcal-Unspecified 05/22/2016   Respiratory Syncytial Virus Vaccine,Recomb Aduvanted(Arexvy) 06/13/2022   Tdap 10/03/2017, 01/13/2021   Zoster Recombinant(Shingrix) 04/29/2021, 06/26/2021   Zoster, Live 06/12/2022   Pertinent  Health Maintenance Due  Topic Date Due   INFLUENZA VACCINE  12/25/2023   DEXA SCAN  Completed      01/13/2021    6:42 PM 02/19/2022   12:53 AM 06/24/2022    2:30 PM 06/27/2022   11:00 AM 10/28/2022    9:50 AM  Fall Risk  Falls in the past year?   0 0 0  Was there an injury with Fall?   0 0 0  Fall Risk Category Calculator   0  0  (RETIRED) Patient Fall Risk Level Moderate fall risk High fall risk     Patient at Risk for Falls Due to   No Fall Risks No Fall Risks History of fall(s)  Fall risk Follow up   Falls evaluation completed Falls evaluation completed Falls evaluation completed   Functional Status Survey:    Vitals:   10/12/23 1147  BP: 112/73  Pulse: 84  Resp: 18  Temp: (!) 97.1 F (36.2 C)  SpO2: 98%  Weight: 132 lb 12.8 oz (60.2 kg)   Body mass index is 21.43 kg/m. Physical Exam Vitals and nursing note reviewed.  Constitutional:      Appearance: Normal appearance.     Comments: Gradual weight loss  HENT:     Head: Normocephalic and  atraumatic.     Mouth/Throat:     Mouth: Mucous membranes are moist.  Eyes:     Extraocular Movements: Extraocular movements intact.     Conjunctiva/sclera: Conjunctivae normal.     Pupils: Pupils are equal, round, and reactive to light.  Cardiovascular:     Rate and Rhythm: Normal rate and regular rhythm.     Heart sounds: No murmur heard. Pulmonary:     Effort: Pulmonary effort is normal.     Breath sounds: No rales.  Abdominal:     General: Bowel sounds are normal.     Palpations: Abdomen is soft.     Tenderness: There is  no abdominal tenderness.  Musculoskeletal:     Right lower leg: No edema.     Left lower leg: No edema.  Skin:    General: Skin is warm and dry.  Neurological:     General: No focal deficit present.     Mental Status: She is alert. Mental status is at baseline.     Gait: Gait abnormal.     Comments: Oriented to person W/c for mobility  Psychiatric:     Comments: Confused, pleasant, conversing, intermittent agitation     Labs reviewed: Recent Labs    03/03/23 0000  NA 139  K 4.2  CL 101  CO2 29*  BUN 12  CREATININE 1.1  CALCIUM 8.6*   No results for input(s): "AST", "ALT", "ALKPHOS", "BILITOT", "PROT", "ALBUMIN" in the last 8760 hours. Recent Labs    03/03/23 0000  WBC 8.0  NEUTROABS 6,064.00  HGB 13.3  HCT 42  PLT 293   Lab Results  Component Value Date   TSH 3.08 04/09/2023   No results found for: "HGBA1C" No results found for: "CHOL", "HDL", "LDLCALC", "LDLDIRECT", "TRIG", "CHOLHDL"  Significant Diagnostic Results in last 30 days:  No results found.  Assessment/Plan: Senile dementia (HCC) Dementia/behavioral issues, increasing Depakote  250mg  tid po, continue Namenda , off Seroquel . MMSE 10/30 06/27/22  Depression with anxiety intermittent agitations,  increasing Lexpro 10/5mg  qd, on Clonazepam  0.25mg  qhs, increasing Depakote  250mg  tid po for mood/behaviors,  off Hydroxyzine, Quetiapine , TSH 3.08 04/09/23  Adult failure to  thrive  Adult failure to thrive/Weight loss, gradual weight loss. Under Hospice service.   GERD (gastroesophageal reflux disease) stable, taking Omeprazole. Hgb 13.3 03/03/23    Family/ staff Communication: plan of care reviewed with the patient, HPOA, and charge nurse.   Labs/tests ordered:  none

## 2023-10-12 NOTE — Assessment & Plan Note (Addendum)
 Dementia/behavioral issues, increasing Depakote  250mg  tid po, continue Namenda , off Seroquel . MMSE 10/30 06/27/22

## 2023-10-26 ENCOUNTER — Non-Acute Institutional Stay (SKILLED_NURSING_FACILITY): Payer: Self-pay | Admitting: Nurse Practitioner

## 2023-10-26 ENCOUNTER — Encounter: Payer: Self-pay | Admitting: Nurse Practitioner

## 2023-10-26 DIAGNOSIS — R2681 Unsteadiness on feet: Secondary | ICD-10-CM

## 2023-10-26 DIAGNOSIS — R419 Unspecified symptoms and signs involving cognitive functions and awareness: Secondary | ICD-10-CM

## 2023-10-26 DIAGNOSIS — F418 Other specified anxiety disorders: Secondary | ICD-10-CM

## 2023-10-26 DIAGNOSIS — R627 Adult failure to thrive: Secondary | ICD-10-CM

## 2023-10-26 NOTE — Assessment & Plan Note (Signed)
 intermittent agitations, takes Lexpro, Clonazepam , Depakote  was increased to 250mg  tid 10/09/23 for better mood control, off Hydroxyzine, Quetiapine , TSH 3.08 04/09/23

## 2023-10-26 NOTE — Assessment & Plan Note (Signed)
 takes Depakote, Namenda, off Seroquel. MMSE 10/30 06/27/22

## 2023-10-26 NOTE — Assessment & Plan Note (Signed)
 w/c for mobility, risk of falling due to lack of safety awareness and needs assistance with transfer.

## 2023-10-26 NOTE — Progress Notes (Signed)
 Location:   [facility] Room Number: [phone] Place of Service:  SNF (31) Provider: Abner Hoffman Rainah Kirshner NP  Janesa Dockery X, NP  Patient Care Team: Windy Dudek X, NP as PCP - General (Internal Medicine)  Extended Emergency Contact Information Primary Emergency Contact: Byron Castellani Address: [address]          [place], [place] [address] [place] Home Phone: [phone] Mobile Phone: [phone] Relation: Relative Secondary Emergency Contact: West,Doug Home Phone: [phone] Mobile Phone: [phone] Relation: Relative  Code Status:  DNR Goals of care: Advanced Directive information    07/23/2023    3:18 PM  Advanced Directives  Does Patient Have a Medical Advance Directive? Yes  Type of Estate agent of [place];Out of facility DNR (pink MOST or yellow form);Living will  Does patient want to make changes to medical advance directive? No - Patient declined  Copy of Healthcare Power of Attorney in Chart? Yes - validated most recent copy scanned in chart (See row information)  Pre-existing out of facility DNR order (yellow form or pink MOST form) Yellow form placed in chart (order not valid for inpatient use);Pink MOST form placed in chart (order not valid for inpatient use)     Chief Complaint  Patient presents with   Medical Management of Chronic Issues    HPI:  Pt is a 89 y.o. female seen today for medical management of chronic diseases.   DEXA 06/20/22 t score -2.597, off Ca, Vit D, Alendronate, goal of care is comfort measures.              Dementia/behavioral issues, takes Depakote , Namenda , off Seroquel . MMSE 10/30 06/27/22             Depression/anxiety, intermittent agitations, takes Lexpro, Clonazepam , Depakote , off Hydroxyzine, Quetiapine , TSH 3.08 04/09/23             Adult failure to thrive/Weight loss, gradual weight loss. Under Hospice service.  CKD stable, Bun/creat 12/1.1 03/03/23 Elevated TSH 3.08 04/09/23,  normalized.  Gait abnormality, w/c for mobility, risk of falling due to lack of safety awareness and needs assistance with transfer.  GERD, stable, taking Omeprazole. Hgb 13.3 03/03/23 Seroma per surgeon consultation 02/19/22 CT abd/pelvis with fluid collection in the liver at the same site of prior abscess in 2021                  Past Medical History:  Diagnosis Date   Arthritis    Dementia (HCC)    Hx of concussion    [facility]    Hyperlipidemia    Vitamin D deficiency, unspecified    Past Surgical History:  Procedure Laterality Date   CHOLECYSTECTOMY      Allergies  Allergen Reactions   Fish-Derived Products    Pravachol [Pravastatin Sodium]     Allergies as of 10/26/2023       Reactions   Fish-derived Products    Pravachol [pravastatin Sodium]         Medication List        Accurate as of October 26, 2023 11:59 PM. If you have any questions, ask your nurse or doctor.          alendronate 70 MG tablet Commonly known as: FOSAMAX Take 70 mg by mouth once a week. Take with a full glass of water on an empty stomach.   Artificial Tears ophthalmic solution Place 3 drops into both eyes 3 (three) times daily. related to Dry eye syndrome  of bilateral lacrimal glands   Caltrate 600+D3 600-20 MG-MCG Tabs Generic drug: Calcium Carb-Cholecalciferol Take 1 tablet by mouth in the morning and at bedtime.   clonazePAM  0.25 MG disintegrating tablet Commonly known as: KLONOPIN  Take 0.25 mg by mouth at bedtime.   clonazePAM  0.5 MG tablet Commonly known as: KLONOPIN  Take 0.5 tablets (0.25 mg total) by mouth at bedtime.   divalproex  125 MG DR tablet Commonly known as: DEPAKOTE  Take 250 mg by mouth 2 (two) times daily. * DO NOT CRUSH OR CHEW* *HAZARDOUS DRUG: WEAR GLOVES*   escitalopram 10 MG tablet Commonly known as: LEXAPRO Take 10 mg by mouth daily.   memantine  10 MG tablet Commonly known as: NAMENDA  Take 10 mg by mouth 2 (two) times daily.    omeprazole 20 MG capsule Commonly known as: PRILOSEC Take 20 mg by mouth daily.        Review of Systems  Unable to perform ROS: Dementia    Immunization History  Administered Date(s) Administered   Influenza, High Dose Seasonal PF 04/14/2023   Influenza-Unspecified 02/26/2019, 03/07/2020, 03/13/2021, 03/24/2022   Moderna Covid-19 Vaccine Bivalent Booster 51yrs & up 03/27/2022, 03/11/2023   Moderna Sars-Covid-2 Vaccination 05/28/2019, 06/25/2019, 07/23/2019, 04/03/2020, 10/23/2020, 10/11/2021   PFIZER(Purple Top)SARS-COV-2 Vaccination 02/12/2021   Pneumococcal Conjugate-13 12/31/2018   Pneumococcal Polysaccharide-23 02/24/2004   Pneumococcal-Unspecified 05/22/2016   Respiratory Syncytial Virus Vaccine,Recomb Aduvanted(Arexvy) 06/13/2022   Tdap 10/03/2017, 01/13/2021   Zoster Recombinant(Shingrix) 04/29/2021, 06/26/2021   Zoster, Live 06/12/2022   Pertinent  Health Maintenance Due  Topic Date Due   INFLUENZA VACCINE  12/25/2023   DEXA SCAN  Completed      01/13/2021    6:42 PM 02/19/2022   12:53 AM 06/24/2022    2:30 PM 06/27/2022   11:00 AM 10/28/2022    9:50 AM  Fall Risk  Falls in the past year?   0 0 0  Was there an injury with Fall?   0 0 0  Fall Risk Category Calculator   0  0  (RETIRED) Patient Fall Risk Level Moderate fall risk High fall risk     Patient at Risk for Falls Due to   No Fall Risks No Fall Risks History of fall(s)  Fall risk Follow up   Falls evaluation completed Falls evaluation completed Falls evaluation completed   Functional Status Survey:    Vitals:   10/26/23 1204  BP: 116/72  Pulse: 76  Resp: 16  Temp: (!) 97.3 F (36.3 C)  SpO2: 98%  Weight: 133 lb 14.4 oz (60.7 kg)   Body mass index is 21.61 kg/m. Physical Exam Vitals and nursing note reviewed.  Constitutional:      Appearance: Normal appearance.     Comments: Gradual weight loss  HENT:     Head: Normocephalic and atraumatic.     Mouth/Throat:     Mouth: Mucous membranes  are moist.  Eyes:     Extraocular Movements: Extraocular movements intact.     Conjunctiva/sclera: Conjunctivae normal.     Pupils: Pupils are equal, round, and reactive to light.  Cardiovascular:     Rate and Rhythm: Normal rate and regular rhythm.     Heart sounds: No murmur heard. Pulmonary:     Effort: Pulmonary effort is normal.     Breath sounds: No rales.  Abdominal:     General: Bowel sounds are normal.     Palpations: Abdomen is soft.     Tenderness: There is no abdominal tenderness.  Musculoskeletal:  Right lower leg: No edema.     Left lower leg: No edema.  Skin:    General: Skin is warm and dry.  Neurological:     General: No focal deficit present.     Mental Status: She is alert. Mental status is at baseline.     Gait: Gait abnormal.     Comments: Oriented to person W/c for mobility  Psychiatric:     Comments: Confused, pleasant, conversing, intermittent agitation     Labs reviewed: Recent Labs    03/03/23 0000  NA 139  K 4.2  CL 101  CO2 29*  BUN 12  CREATININE 1.1  CALCIUM 8.6*   No results for input(s): "AST", "ALT", "ALKPHOS", "BILITOT", "PROT", "ALBUMIN" in the last 8760 hours. Recent Labs    03/03/23 0000  WBC 8.0  NEUTROABS 6,064.00  HGB 13.3  HCT 42  PLT 293   Lab Results  Component Value Date   TSH 3.08 04/09/2023   No results found for: "HGBA1C" No results found for: "CHOL", "HDL", "LDLCALC", "LDLDIRECT", "TRIG", "CHOLHDL"  Significant Diagnostic Results in last 30 days:  No results found.  Assessment/Plan  Neurocognitive disorder takes Depakote , Namenda , off Seroquel . MMSE 10/30 06/27/22  Depression with anxiety intermittent agitations, takes Lexpro, Clonazepam , Depakote  was increased to 250mg  tid 10/09/23 for better mood control, off Hydroxyzine, Quetiapine , TSH 3.08 04/09/23  Adult failure to thrive  gradual weight loss. Under Hospice service.   Unsteady gait w/c for mobility, risk of falling due to lack of safety  awareness and needs assistance with transfer.   GERD (gastroesophageal reflux disease) stable, taking Omeprazole. Hgb 13.3 03/03/23   Family/ staff Communication: plan of care reviewed with the patient and charge nurse.   Labs/tests ordered:  none

## 2023-10-26 NOTE — Assessment & Plan Note (Signed)
 gradual weight loss. Under Hospice service.

## 2023-10-26 NOTE — Assessment & Plan Note (Signed)
 stable, taking Omeprazole. Hgb 13.3 03/03/23

## 2023-12-04 ENCOUNTER — Non-Acute Institutional Stay (SKILLED_NURSING_FACILITY): Payer: Self-pay | Admitting: Sports Medicine

## 2023-12-04 ENCOUNTER — Encounter: Payer: Self-pay | Admitting: Sports Medicine

## 2023-12-04 DIAGNOSIS — F3289 Other specified depressive episodes: Secondary | ICD-10-CM | POA: Diagnosis not present

## 2023-12-04 DIAGNOSIS — Z515 Encounter for palliative care: Secondary | ICD-10-CM | POA: Diagnosis not present

## 2023-12-04 DIAGNOSIS — F02818 Dementia in other diseases classified elsewhere, unspecified severity, with other behavioral disturbance: Secondary | ICD-10-CM

## 2023-12-04 DIAGNOSIS — K219 Gastro-esophageal reflux disease without esophagitis: Secondary | ICD-10-CM | POA: Diagnosis not present

## 2023-12-04 DIAGNOSIS — G309 Alzheimer's disease, unspecified: Secondary | ICD-10-CM

## 2023-12-04 NOTE — Progress Notes (Signed)
 Location:  [facility]   Nursing Home Room Number: [id number] Place of Service:  SNF (31) Provider: Sherlynn Madden, MD  Mast, Man X, NP  Patient Care Team: Mast, Man X, NP as PCP - General (Internal Medicine)  Extended Emergency Contact Information Primary Emergency Contact: Sallyann DELTA Quant Address: [address]          [place], [place] [address] [place] Home Phone: [phone] Mobile Phone: [phone] Relation: Relative Secondary Emergency Contact: West,Doug Home Phone: [phone] Mobile Phone: [phone] Relation: Relative  Code Status:   Goals of care: Advanced Directive information    07/23/2023    3:18 PM  Advanced Directives  Does Patient Have a Medical Advance Directive? Yes  Type of Estate agent of [place];Out of facility DNR (pink MOST or yellow form);Living will  Does patient want to make changes to medical advance directive? No - Patient declined  Copy of Healthcare Power of Attorney in Chart? Yes - validated most recent copy scanned in chart (See row information)  Pre-existing out of facility DNR order (yellow form or pink MOST form) Yellow form placed in chart (order not valid for inpatient use);Pink MOST form placed in chart (order not valid for inpatient use)     Chief Complaint  Patient presents with   Medical Management of Chronic Issues    Routine Visit                             Hospice care visit  HPI:  Pt is a 89 y.o. female with past medical history of dementia, osteoporosis, depression is seen today for medical management of chronic diseases.   Patient seen and examined in her room.  She is laying on her bed.  She opens her eyes on calling her name.  She knows her name but does not participate much in the conversation.  She is nonambulatory and needs assistance with transferring.  She needs assistance with feeding.  She eats about 25% of her meals.  No agitation per nursing staff. No  skin breakdowns.   Past Medical History:  Diagnosis Date   Arthritis    Dementia (HCC)    Hx of concussion    [facility]    Hyperlipidemia    Vitamin D deficiency, unspecified    Past Surgical History:  Procedure Laterality Date   CHOLECYSTECTOMY      Allergies  Allergen Reactions   Fish-Derived Products    Pravachol [Pravastatin Sodium]     Allergies as of 12/04/2023       Reactions   Fish-derived Products    Pravachol [pravastatin Sodium]         Medication List        Accurate as of December 04, 2023  2:19 PM. If you have any questions, ask your nurse or doctor.          alendronate 70 MG tablet Commonly known as: FOSAMAX Take 70 mg by mouth once a week. Take with a full glass of water on an empty stomach.   Artificial Tears ophthalmic solution Place 3 drops into both eyes 3 (three) times daily. related to Dry eye syndrome of bilateral lacrimal glands   Caltrate 600+D3 600-20 MG-MCG Tabs Generic drug: Calcium Carb-Cholecalciferol Take 1 tablet by mouth in the morning and at bedtime.   clonazePAM  0.25 MG disintegrating tablet Commonly known as: KLONOPIN  Take 0.25 mg by mouth at bedtime.   clonazePAM  0.5 MG  tablet Commonly known as: KLONOPIN  Take 0.5 tablets (0.25 mg total) by mouth at bedtime.   divalproex  125 MG DR tablet Commonly known as: DEPAKOTE  Take 250 mg by mouth 2 (two) times daily. * DO NOT CRUSH OR CHEW* *HAZARDOUS DRUG: WEAR GLOVES*   escitalopram 10 MG tablet Commonly known as: LEXAPRO Take 10 mg by mouth daily.   memantine  10 MG tablet Commonly known as: NAMENDA  Take 10 mg by mouth 2 (two) times daily.   omeprazole 20 MG capsule Commonly known as: PRILOSEC Take 20 mg by mouth daily.        Review of Systems  Constitutional:  Negative for fever.  Respiratory:  Negative for cough.   Gastrointestinal:  Negative for blood in stool.  Genitourinary:  Negative for hematuria.  Psychiatric/Behavioral:   Negative for agitation.     Immunization History  Administered Date(s) Administered   Influenza, High Dose Seasonal PF 04/14/2023   Influenza-Unspecified 02/26/2019, 03/07/2020, 03/13/2021, 03/24/2022   Moderna Covid-19 Vaccine Bivalent Booster 26yrs & up 03/27/2022, 03/11/2023   Moderna Sars-Covid-2 Vaccination 05/28/2019, 06/25/2019, 07/23/2019, 04/03/2020, 10/23/2020, 10/11/2021   PFIZER(Purple Top)SARS-COV-2 Vaccination 02/12/2021   Pneumococcal Conjugate-13 12/31/2018   Pneumococcal Polysaccharide-23 02/24/2004   Pneumococcal-Unspecified 05/22/2016   Respiratory Syncytial Virus Vaccine,Recomb Aduvanted(Arexvy) 06/13/2022   Tdap 10/03/2017, 01/13/2021   Zoster Recombinant(Shingrix) 04/29/2021, 06/26/2021   Zoster, Live 06/12/2022   Pertinent  Health Maintenance Due  Topic Date Due   INFLUENZA VACCINE  12/25/2023   DEXA SCAN  Completed      01/13/2021    6:42 PM 02/19/2022   12:53 AM 06/24/2022    2:30 PM 06/27/2022   11:00 AM 10/28/2022    9:50 AM  Fall Risk  Falls in the past year?   0 0 0  Was there an injury with Fall?   0 0 0  Fall Risk Category Calculator   0  0  (RETIRED) Patient Fall Risk Level Moderate fall risk  High fall risk      Patient at Risk for Falls Due to   No Fall Risks No Fall Risks History of fall(s)  Fall risk Follow up   Falls evaluation completed Falls evaluation completed Falls evaluation completed     Data saved with a previous flowsheet row definition   Functional Status Survey:    Vitals:   12/04/23 1415  BP: 112/79  Pulse: 75  Resp: 16  Temp: (!) 97.1 F (36.2 C)  SpO2: 98%  Weight: 132 lb 12.8 oz (60.2 kg)  Height: 5' 6 (1.676 m)   Body mass index is 21.43 kg/m. Physical Exam Constitutional:      Appearance: Normal appearance.  HENT:     Head: Normocephalic.  Cardiovascular:     Rate and Rhythm: Normal rate and regular rhythm.  Pulmonary:     Effort: Pulmonary effort is normal. No respiratory distress.     Breath sounds:  Normal breath sounds. No wheezing.  Abdominal:     General: Bowel sounds are normal. There is no distension.     Tenderness: There is no abdominal tenderness. There is no guarding or rebound.     Comments:    Musculoskeletal:        General: No swelling.  Neurological:     Mental Status: She is alert. Mental status is at baseline.     Motor: No weakness.     Labs reviewed: Recent Labs    03/03/23 0000  NA 139  K 4.2  CL 101  CO2 29*  BUN 12  CREATININE 1.1  CALCIUM 8.6*   No results for input(s): AST, ALT, ALKPHOS, BILITOT, PROT, ALBUMIN in the last 8760 hours. Recent Labs    03/03/23 0000  WBC 8.0  NEUTROABS 6,064.00  HGB 13.3  HCT 42  PLT 293   Lab Results  Component Value Date   TSH 3.08 04/09/2023   No results found for: HGBA1C No results found for: CHOL, HDL, LDLCALC, LDLDIRECT, TRIG, CHOLHDL  Significant Diagnostic Results in last 30 days:  No results found.  Assessment/Plan  Major neurocognitive disorder with behavioral disturbances Patient seems pleasant and comfortable and does not appear to be in distress. Patient has normal ambulatory and wheelchair dependent. She does not participate in conversation. No agitation per nursing staff Continue with the Klonopin , Depakote  Per nursing staff patient has behavioral episodes when tried to taper Klonopin  in the past. Will stop Namenda  if family agrees. Functional Assessment Staging Scale: 7d - Cannot sit up without assistance.   Hospice care patient Patient seems pleasant and comfortable and does not appear to be in distress Continue with comfort care  Depression Stable Continue with Lexapro  GERD Continue with omeprazole 20 mg    30 min Total time spent for obtaining history,  performing a medically appropriate examination and evaluation, reviewing the tests,  documenting clinical information in the electronic or other health record, care coordination (not separately  reported)

## 2023-12-28 ENCOUNTER — Non-Acute Institutional Stay (SKILLED_NURSING_FACILITY): Payer: Self-pay | Admitting: Nurse Practitioner

## 2023-12-28 ENCOUNTER — Encounter: Payer: Self-pay | Admitting: Nurse Practitioner

## 2023-12-28 DIAGNOSIS — F0390 Unspecified dementia without behavioral disturbance: Secondary | ICD-10-CM

## 2023-12-28 DIAGNOSIS — F418 Other specified anxiety disorders: Secondary | ICD-10-CM

## 2023-12-28 DIAGNOSIS — K219 Gastro-esophageal reflux disease without esophagitis: Secondary | ICD-10-CM

## 2023-12-28 NOTE — Assessment & Plan Note (Signed)
 Dementia/behavioral issues, takes Depakote, Namenda, off Seroquel. MMSE 10/30 06/27/22

## 2023-12-28 NOTE — Assessment & Plan Note (Signed)
 intermittent agitations, takes Lexpro, Clonazepam , Depakote , off Hydroxyzine, Quetiapine , TSH 3.08 04/09/23

## 2023-12-28 NOTE — Progress Notes (Unsigned)
 Location:   [facility] Room Number: [id number] Place of Service:  SNF (31) Provider: Larwance Emree Locicero NP  Pasty Manninen X, NP  Patient Care Team: Inioluwa Boulay X, NP as PCP - General (Internal Medicine)  Extended Emergency Contact Information Primary Emergency Contact: Sallyann DELTA Quant Address: [address]          [place], [place] [address] [place] Home Phone: [phone] Mobile Phone: [phone] Relation: Relative Secondary Emergency Contact: West,Doug Home Phone: [phone] Mobile Phone: [phone] Relation: Relative  Code Status:  DNR Goals of care: Advanced Directive information    07/23/2023    3:18 PM  Advanced Directives  Does Patient Have a Medical Advance Directive? Yes  Type of Estate agent of [place];Out of facility DNR (pink MOST or yellow form);Living will  Does patient want to make changes to medical advance directive? No - Patient declined  Copy of Healthcare Power of Attorney in Chart? Yes - validated most recent copy scanned in chart (See row information)  Pre-existing out of facility DNR order (yellow form or pink MOST form) Yellow form placed in chart (order not valid for inpatient use);Pink MOST form placed in chart (order not valid for inpatient use)     Chief Complaint  Patient presents with   Medicare Wellness    HPI:  Pt is a 89 y.o. female seen today for medical management of chronic diseases.   DEXA 06/20/22 t score -2.597, off Ca, Vit D, Alendronate, goal of care is comfort measures.              Dementia/behavioral issues, takes Depakote , Namenda , off Seroquel . MMSE 10/30 06/27/22             Depression/anxiety, intermittent agitations, takes Lexpro, Clonazepam , Depakote , off Hydroxyzine, Quetiapine , TSH 3.08 04/09/23             Adult failure to thrive/Weight loss, gradual weight loss. Under Hospice service.  CKD stable, Bun/creat 12/1.1 03/03/23 Elevated TSH 3.08 04/09/23, normalized.  Gait  abnormality, w/c for mobility, risk of falling due to lack of safety awareness and needs assistance with transfer.  GERD, stable, taking Omeprazole. Hgb 13.3 03/03/23 Seroma per surgeon consultation 02/19/22 CT abd/pelvis with fluid collection in the liver at the same site of prior abscess in 2021                    Past Medical History:  Diagnosis Date   Arthritis    Dementia (HCC)    Hx of concussion    [facility]    Hyperlipidemia    Vitamin D deficiency, unspecified    Past Surgical History:  Procedure Laterality Date   CHOLECYSTECTOMY      Allergies  Allergen Reactions   Fish-Derived Products    Pravachol [Pravastatin Sodium]     Allergies as of 12/28/2023       Reactions   Fish-derived Products    Pravachol [pravastatin Sodium]         Medication List        Accurate as of December 28, 2023 11:59 PM. If you have any questions, ask your nurse or doctor.          Artificial Tears ophthalmic solution Place 3 drops into both eyes 3 (three) times daily. related to Dry eye syndrome of bilateral lacrimal glands   clonazePAM  0.25 MG disintegrating tablet Commonly known as: KLONOPIN  Take 0.25 mg by mouth at bedtime.   divalproex  125 MG DR tablet Commonly  known as: DEPAKOTE  Take 250 mg by mouth 2 (two) times daily. * DO NOT CRUSH OR CHEW* *HAZARDOUS DRUG: WEAR GLOVES*   escitalopram 10 MG tablet Commonly known as: LEXAPRO Take 10 mg by mouth daily.   memantine  10 MG tablet Commonly known as: NAMENDA  Take 10 mg by mouth 2 (two) times daily.   omeprazole 20 MG capsule Commonly known as: PRILOSEC Take 20 mg by mouth daily.        Review of Systems  Unable to perform ROS: Dementia    Immunization History  Administered Date(s) Administered   Influenza, High Dose Seasonal PF 04/14/2023   Influenza-Unspecified 02/26/2019, 03/07/2020, 03/13/2021, 03/24/2022   Moderna Covid-19 Vaccine Bivalent Booster 36yrs & up 03/27/2022, 03/11/2023    Moderna Sars-Covid-2 Vaccination 05/28/2019, 06/25/2019, 07/23/2019, 04/03/2020, 10/23/2020, 10/11/2021   PFIZER(Purple Top)SARS-COV-2 Vaccination 02/12/2021   Pneumococcal Conjugate-13 12/31/2018   Pneumococcal Polysaccharide-23 02/24/2004   Pneumococcal-Unspecified 05/22/2016   Respiratory Syncytial Virus Vaccine,Recomb Aduvanted(Arexvy) 06/13/2022   Tdap 10/03/2017, 01/13/2021   Zoster Recombinant(Shingrix) 04/29/2021, 06/26/2021   Zoster, Live 06/12/2022   Pertinent  Health Maintenance Due  Topic Date Due   INFLUENZA VACCINE  12/25/2023   DEXA SCAN  Completed      01/13/2021    6:42 PM 02/19/2022   12:53 AM 06/24/2022    2:30 PM 06/27/2022   11:00 AM 10/28/2022    9:50 AM  Fall Risk  Falls in the past year?   0 0 0  Was there an injury with Fall?   0 0 0  Fall Risk Category Calculator   0  0  (RETIRED) Patient Fall Risk Level Moderate fall risk  High fall risk      Patient at Risk for Falls Due to   No Fall Risks No Fall Risks History of fall(s)  Fall risk Follow up   Falls evaluation completed Falls evaluation completed Falls evaluation completed     Data saved with a previous flowsheet row definition   Functional Status Survey:    Vitals:   12/28/23 1344  BP: 99/60  Pulse: 75  Resp: 15  Temp: 97.9 F (36.6 C)  SpO2: 95%  Weight: 128 lb 4.8 oz (58.2 kg)   Body mass index is 20.71 kg/m. Physical Exam Vitals and nursing note reviewed.  Constitutional:      Comments: Gradual weight loss Appears sleepy  HENT:     Head: Normocephalic and atraumatic.     Mouth/Throat:     Mouth: Mucous membranes are moist.  Eyes:     Extraocular Movements: Extraocular movements intact.     Conjunctiva/sclera: Conjunctivae normal.     Pupils: Pupils are equal, round, and reactive to light.  Cardiovascular:     Rate and Rhythm: Normal rate and regular rhythm.     Heart sounds: No murmur heard. Pulmonary:     Effort: Pulmonary effort is normal.     Breath sounds: No rales.   Abdominal:     General: Bowel sounds are normal.     Palpations: Abdomen is soft.     Tenderness: There is no abdominal tenderness.  Musculoskeletal:     Right lower leg: No edema.     Left lower leg: No edema.  Skin:    General: Skin is warm and dry.  Neurological:     General: No focal deficit present.     Mental Status: She is alert. Mental status is at baseline.     Gait: Gait abnormal.     Comments: Oriented to  person W/c for mobility  Psychiatric:     Comments: Confused, pleasant, conversing, intermittent agitation     Labs reviewed: Recent Labs    03/03/23 0000  NA 139  K 4.2  CL 101  CO2 29*  BUN 12  CREATININE 1.1  CALCIUM 8.6*   No results for input(s): AST, ALT, ALKPHOS, BILITOT, PROT, ALBUMIN in the last 8760 hours. Recent Labs    03/03/23 0000  WBC 8.0  NEUTROABS 6,064.00  HGB 13.3  HCT 42  PLT 293   Lab Results  Component Value Date   TSH 3.08 04/09/2023   No results found for: HGBA1C No results found for: CHOL, HDL, LDLCALC, LDLDIRECT, TRIG, CHOLHDL  Significant Diagnostic Results in last 30 days:  No results found.  Assessment/Plan  Major neurocognitive disorder (HCC) Dementia/behavioral issues, takes Depakote , Namenda , off Seroquel . MMSE 10/30 06/27/22  Depression with anxiety  intermittent agitations, decrease Depakote  125mg  qid due to sleepiness, off Hydroxyzine, Quetiapine , Lexapro, Clonazepam , TSH 3.08 04/09/23  GERD (gastroesophageal reflux disease)  intermittent agitations, takes Lexpro, Clonazepam , Depakote , off Hydroxyzine, Quetiapine , TSH 3.08 04/09/23   Family/ staff Communication: Plan of care reviewed with the patient and the charge nurse  Labs/tests ordered: None

## 2023-12-28 NOTE — Assessment & Plan Note (Signed)
 intermittent agitations, decrease Depakote  125mg  qid due to sleepiness, off Hydroxyzine, Quetiapine , Lexapro, Clonazepam , TSH 3.08 04/09/23

## 2024-01-26 ENCOUNTER — Encounter: Payer: Self-pay | Admitting: Nurse Practitioner

## 2024-01-26 ENCOUNTER — Non-Acute Institutional Stay (SKILLED_NURSING_FACILITY): Payer: Self-pay | Admitting: Nurse Practitioner

## 2024-01-26 DIAGNOSIS — K219 Gastro-esophageal reflux disease without esophagitis: Secondary | ICD-10-CM

## 2024-01-26 DIAGNOSIS — F418 Other specified anxiety disorders: Secondary | ICD-10-CM

## 2024-01-26 DIAGNOSIS — F0390 Unspecified dementia without behavioral disturbance: Secondary | ICD-10-CM

## 2024-01-26 NOTE — Assessment & Plan Note (Signed)
 stable, taking Omeprazole. Hgb 13.3 03/03/23

## 2024-01-26 NOTE — Progress Notes (Signed)
 Location:  [facility] Room Number: N053-A Place of Service:  SNF (31) Provider:  Leslieanne Cobarrubias X, NP  Patient Care Team: Kealohilani Maiorino X, NP as PCP - General (Internal Medicine)  Extended Emergency Contact Information Primary Emergency Contact: Jeffreys,(POA) Margaret Address: [address]          [place], [place] [address] [place] Home Phone: [phone] Mobile Phone: [phone] Relation: Relative Secondary Emergency Contact: West,Doug Home Phone: [phone] Mobile Phone: [phone] Relation: Relative  Code Status:  DNR Goals of care: Advanced Directive information    07/23/2023    3:18 PM  Advanced Directives  Does Patient Have a Medical Advance Directive? Yes  Type of Estate agent of [place];Out of facility DNR (pink MOST or yellow form);Living will  Does patient want to make changes to medical advance directive? No - Patient declined  Copy of Healthcare Power of Attorney in Chart? Yes - validated most recent copy scanned in chart (See row information)  Pre-existing out of facility DNR order (yellow form or pink MOST form) Yellow form placed in chart (order not valid for inpatient use);Pink MOST form placed in chart (order not valid for inpatient use)     Chief Complaint  Patient presents with   Medical Management of Chronic Issues    Routine visit     HPI:  Pt is a 89 y.o. female seen today for medical management of chronic diseases.     DEXA 06/20/22 t score -2.597, off Ca, Vit D, Alendronate, goal of care is comfort measures.              Dementia/behavioral issues,  off Namenda , Seroquel , Depakote , MMSE 10/30 06/27/22             Depression/anxiety, intermittent agitations, takes Lexpro, off Depakote , Clonazepam , Hydroxyzine, Quetiapine , TSH 3.08 04/09/23             Adult failure to thrive/Weight loss, gradual weight loss. Under Hospice service.  CKD stable, Bun/creat 12/1.1 03/03/23 Elevated TSH 3.08  04/09/23, normalized.  Gait abnormality, w/c for mobility, risk of falling due to lack of safety awareness and needs assistance with transfer.  GERD, stable, taking Omeprazole. Hgb 13.3 03/03/23 Seroma per surgeon consultation 02/19/22 CT abd/pelvis with fluid collection in the liver at the same site of prior abscess in 2021    Past Medical History:  Diagnosis Date   Arthritis    Dementia (HCC)    Hx of concussion    [facility]    Hyperlipidemia    Vitamin D deficiency, unspecified    Past Surgical History:  Procedure Laterality Date   CHOLECYSTECTOMY      Allergies  Allergen Reactions   Fish-Derived Products    Pravachol [Pravastatin Sodium]     Outpatient Encounter Medications as of 01/26/2024  Medication Sig   Artificial Tears ophthalmic solution Place 3 drops into both eyes 3 (three) times daily. related to Dry eye syndrome of bilateral lacrimal glands   escitalopram (LEXAPRO) 10 MG tablet Take 10 mg by mouth daily.   Nutritional Supplements (BOOST VERY HIGH CALORIE) LIQD Take 1 Can by mouth 2 (two) times daily after a meal.   omeprazole (PRILOSEC) 20 MG capsule Take 20 mg by mouth daily.   [DISCONTINUED] clonazePAM  (KLONOPIN ) 0.25 MG disintegrating tablet Take 0.25 mg by mouth at bedtime. (Patient not taking: Reported on 01/26/2024)   [DISCONTINUED] divalproex  (DEPAKOTE ) 125 MG DR tablet Take 250 mg by mouth 2 (two) times daily. *  DO NOT CRUSH OR CHEW* *HAZARDOUS DRUG: WEAR GLOVES* (Patient not taking: Reported on 01/26/2024)   [DISCONTINUED] memantine  (NAMENDA ) 10 MG tablet Take 10 mg by mouth 2 (two) times daily. (Patient not taking: Reported on 01/26/2024)   No facility-administered encounter medications on file as of 01/26/2024.    Review of Systems  Unable to perform ROS: Dementia    Immunization History  Administered Date(s) Administered   INFLUENZA, HIGH DOSE SEASONAL PF 04/14/2023   Influenza-Unspecified 02/26/2019, 03/07/2020, 03/13/2021,  03/24/2022   Moderna Covid-19 Vaccine Bivalent Booster 58yrs & up 03/27/2022, 03/11/2023   Moderna Sars-Covid-2 Vaccination 05/28/2019, 06/25/2019, 07/23/2019, 04/03/2020, 10/23/2020, 10/11/2021   PFIZER(Purple Top)SARS-COV-2 Vaccination 02/12/2021   Pneumococcal Conjugate-13 12/31/2018   Pneumococcal Polysaccharide-23 02/24/2004   Pneumococcal-Unspecified 05/22/2016   Respiratory Syncytial Virus Vaccine,Recomb Aduvanted(Arexvy) 06/13/2022   Tdap 10/03/2017, 01/13/2021   Zoster Recombinant(Shingrix) 04/29/2021, 06/26/2021   Zoster, Live 06/12/2022   Pertinent  Health Maintenance Due  Topic Date Due   INFLUENZA VACCINE  02/23/2024 (Originally 12/25/2023)   DEXA SCAN  Completed      01/13/2021    6:42 PM 02/19/2022   12:53 AM 06/24/2022    2:30 PM 06/27/2022   11:00 AM 10/28/2022    9:50 AM  Fall Risk  Falls in the past year?   0 0 0  Was there an injury with Fall?   0 0 0  Fall Risk Category Calculator   0  0  (RETIRED) Patient Fall Risk Level Moderate fall risk  High fall risk      Patient at Risk for Falls Due to   No Fall Risks No Fall Risks History of fall(s)  Fall risk Follow up   Falls evaluation completed Falls evaluation completed Falls evaluation completed     Data saved with a previous flowsheet row definition   Functional Status Survey:    Vitals:   01/26/24 0856  BP: 114/76  Pulse: 83  Weight: 125 lb (56.7 kg)  Height: 5' 6 (1.676 m)   Body mass index is 20.18 kg/m. Physical Exam Vitals and nursing note reviewed.  Constitutional:      Appearance: Normal appearance.     Comments: Gradual weight loss   HENT:     Head: Normocephalic and atraumatic.     Mouth/Throat:     Mouth: Mucous membranes are moist.  Eyes:     Extraocular Movements: Extraocular movements intact.     Conjunctiva/sclera: Conjunctivae normal.     Pupils: Pupils are equal, round, and reactive to light.  Cardiovascular:     Rate and Rhythm: Normal rate and regular rhythm.     Heart  sounds: No murmur heard. Pulmonary:     Effort: Pulmonary effort is normal.     Breath sounds: No rales.  Abdominal:     General: Bowel sounds are normal.     Palpations: Abdomen is soft.     Tenderness: There is no abdominal tenderness.  Musculoskeletal:     Right lower leg: No edema.     Left lower leg: No edema.  Skin:    General: Skin is warm and dry.  Neurological:     General: No focal deficit present.     Mental Status: She is alert. Mental status is at baseline.     Gait: Gait abnormal.     Comments: Oriented to person W/c for mobility  Psychiatric:     Comments: Confused, pleasant, conversing, intermittent agitation     Labs reviewed: Recent Labs    03/03/23  0000  NA 139  K 4.2  CL 101  CO2 29*  BUN 12  CREATININE 1.1  CALCIUM 8.6*   No results for input(s): AST, ALT, ALKPHOS, BILITOT, PROT, ALBUMIN in the last 8760 hours. Recent Labs    03/03/23 0000  WBC 8.0  NEUTROABS 6,064.00  HGB 13.3  HCT 42  PLT 293   Lab Results  Component Value Date   TSH 3.08 04/09/2023   No results found for: HGBA1C No results found for: CHOL, HDL, LDLCALC, LDLDIRECT, TRIG, CHOLHDL  Significant Diagnostic Results in last 30 days:  No results found.  Assessment/Plan Major neurocognitive disorder (HCC) Adult failure to thrive/Weight loss, gradual weight loss. Under Hospice service.  off Namenda , Seroquel , Depakote , MMSE 10/30 06/27/22  Depression with anxiety , intermittent agitations, takes Lexpro, off Depakote , Clonazepam , Hydroxyzine, Quetiapine , TSH 3.08 04/09/23     Family/ staff Communication: Plan of care reviewed with the patient and charge nurse  Labs/tests ordered: None

## 2024-01-26 NOTE — Assessment & Plan Note (Signed)
,   intermittent agitations, takes Lexpro, off Depakote , Clonazepam , Hydroxyzine, Quetiapine , TSH 3.08 04/09/23

## 2024-01-26 NOTE — Assessment & Plan Note (Signed)
 Adult failure to thrive/Weight loss, gradual weight loss. Under Hospice service.  off Namenda , Seroquel , Depakote , MMSE 10/30 06/27/22

## 2024-02-05 ENCOUNTER — Encounter: Payer: Self-pay | Admitting: Sports Medicine

## 2024-02-05 NOTE — Progress Notes (Signed)
 This encounter was created in error - please disregard.

## 2024-02-29 ENCOUNTER — Non-Acute Institutional Stay (SKILLED_NURSING_FACILITY): Payer: Self-pay | Admitting: Sports Medicine

## 2024-02-29 DIAGNOSIS — F32A Depression, unspecified: Secondary | ICD-10-CM | POA: Diagnosis not present

## 2024-02-29 DIAGNOSIS — Z515 Encounter for palliative care: Secondary | ICD-10-CM

## 2024-02-29 DIAGNOSIS — G309 Alzheimer's disease, unspecified: Secondary | ICD-10-CM

## 2024-02-29 DIAGNOSIS — F02818 Dementia in other diseases classified elsewhere, unspecified severity, with other behavioral disturbance: Secondary | ICD-10-CM

## 2024-02-29 NOTE — Progress Notes (Unsigned)
 Provider:  Dr. Jackalyn Blazing Location:  [facility] Place of Service:   Skilled care   PCP: Mast, Man X, NP Patient Care Team: Mast, Man X, NP as PCP - General (Internal Medicine)  Extended Emergency Contact Information Primary Emergency Contact: Jeffreys,(POA) Margaret Address: [address]          [place], [place] [address] [place] Home Phone: [phone] Mobile Phone: [phone] Relation: Relative Secondary Emergency Contact: West,Doug Home Phone: [phone] Mobile Phone: [phone] Relation: Relative  Goals of Care: Advanced Directive information    02/05/2024   12:18 PM  Advanced Directives  Does Patient Have a Medical Advance Directive? Yes  Type of Estate agent of [place];Out of facility DNR (pink MOST or yellow form);Living will  Does patient want to make changes to medical advance directive? No - Patient declined  Copy of Healthcare Power of Attorney in Chart? Yes - validated most recent copy scanned in chart (See row information)      No chief complaint on file.                 Hospice related visit  History of Present Illness  89 yr old F with h/o Dementia, Depression, CKD, GERD is seen today for chronic disease management. Pt seen and examined in her room  Seems pleasant and comfortable  She does not participate in conversation  As per nursing staff pt refuses care She needs 2 person assistance and transferring.    03/01/2024 11:14 121.4 Lbs (Wheelchair) -3.0% change from last weight [ Comparison Weight 01/23/2024, 130.0 lbs, -3.8%, -5.0 lbs] MDS: -10.0% change over 180 day(s) [ Comparison Weight 07/29/2023, 144.0 lbs, -13.2%, -19.0 lbs] 01/25/2024 13:24 125 Lbs (Wheelchair) 01/23/2024 08:07 130 Lbs (Wheelchair) 01/13/2024 11:07 130 Lbs (Wheelchair) -3.0% change from last weight [ Comparison Weight 12/09/2023, 132.8 lbs, -3.2%, -4.3 lbs] 12/25/2023 13:19 128.5 Lbs  (Wheelchair) 12/09/2023 11:27 132.8 Lbs (Wheelchair) 11/25/2023 09:44 132.8 Lbs (Wheelchair) 11/24/2023 12:45 132.8 Lbs (Wheelchair     Past Medical History:  Diagnosis Date   Arthritis    Dementia (HCC)    Hx of concussion    [facility]    Hyperlipidemia    Vitamin D deficiency, unspecified    Past Surgical History:  Procedure Laterality Date   CHOLECYSTECTOMY      reports that she has never smoked. She has never used smokeless tobacco. She reports that she does not drink alcohol and does not use drugs. Social History   Socioeconomic History   Marital status: Single    Spouse name: Not on file   Number of children: Not on file   Years of education: Not on file   Highest education level: Not on file  Occupational History   Not on file  Tobacco Use   Smoking status: Never   Smokeless tobacco: Never  Vaping Use   Vaping status: Never Used  Substance and Sexual Activity   Alcohol use: Never   Drug use: Never   Sexual activity: Never  Other Topics Concern   Not on file  Social History Narrative   Not on file   Social Drivers of Health   Financial Resource Strain: Not on file  Food Insecurity: Not on file  Transportation Needs: Not on file  Physical Activity: Not on file  Stress: Not on file  Social Connections: Not on file  Intimate Partner Violence: Not on file    Functional Status Survey:    Family History  Family history  unknown: Yes    Health Maintenance  Topic Date Due   Influenza Vaccine  12/25/2023   COVID-19 Vaccine (10 - 2025-26 season) 01/25/2024   DTaP/Tdap/Td (3 - Td or Tdap) 01/14/2031   Pneumococcal Vaccine: 50+ Years  Completed   DEXA SCAN  Completed   Zoster Vaccines- Shingrix  Completed   Meningococcal B Vaccine  Aged Out    Allergies  Allergen Reactions   Fish-Derived Products    Pravachol [Pravastatin Sodium]     Outpatient Encounter Medications as of 02/29/2024  Medication Sig   Artificial Tears  ophthalmic solution Place 3 drops into both eyes 3 (three) times daily. related to Dry eye syndrome of bilateral lacrimal glands   escitalopram (LEXAPRO) 10 MG tablet Take 10 mg by mouth daily.   Nutritional Supplements (BOOST VERY HIGH CALORIE) LIQD Take 1 Can by mouth 2 (two) times daily after a meal. (Patient not taking: Reported on 02/05/2024)   omeprazole (PRILOSEC) 20 MG capsule Take 20 mg by mouth daily.   No facility-administered encounter medications on file as of 02/29/2024.    Review of Systems  Constitutional:  Negative for fever.  Respiratory:  Negative for cough.   Gastrointestinal:  Negative for blood in stool and vomiting.  Genitourinary:  Negative for hematuria.  Psychiatric/Behavioral:  Positive for confusion.    Negative unless indicated in HPI.  There were no vitals filed for this visit. There is no height or weight on file to calculate BMI. BP Readings from Last 3 Encounters:  02/05/24 120/60  01/26/24 114/76  12/28/23 99/60   Wt Readings from Last 3 Encounters:  02/05/24 125 lb (56.7 kg)  01/26/24 125 lb (56.7 kg)  12/28/23 128 lb 4.8 oz (58.2 kg)   Physical Exam Constitutional:      Appearance: Normal appearance.  HENT:     Head: Normocephalic and atraumatic.  Cardiovascular:     Rate and Rhythm: Normal rate and regular rhythm.  Pulmonary:     Effort: Pulmonary effort is normal. No respiratory distress.     Breath sounds: Normal breath sounds. No wheezing.  Abdominal:     General: Bowel sounds are normal. There is no distension.     Tenderness: There is no abdominal tenderness. There is no guarding or rebound.     Comments:    Neurological:     Mental Status: She is alert. Mental status is at baseline.     Motor: No weakness.     Labs reviewed: Basic Metabolic Panel: Recent Labs    03/03/23 0000  NA 139  K 4.2  CL 101  CO2 29*  BUN 12  CREATININE 1.1  CALCIUM 8.6*   Liver Function Tests: No results for input(s): AST, ALT,  ALKPHOS, BILITOT, PROT, ALBUMIN in the last 8760 hours. No results for input(s): LIPASE, AMYLASE in the last 8760 hours. No results for input(s): AMMONIA in the last 8760 hours. CBC: Recent Labs    03/03/23 0000  WBC 8.0  NEUTROABS 6,064.00  HGB 13.3  HCT 42  PLT 293   Cardiac Enzymes: No results for input(s): CKTOTAL, CKMB, CKMBINDEX, TROPONINI in the last 8760 hours. BNP: Invalid input(s): POCBNP No results found for: HGBA1C Lab Results  Component Value Date   TSH 3.08 04/09/2023   Lab Results  Component Value Date   VITAMINB12 466 07/12/2019   No results found for: FOLATE No results found for: IRON, TIBC, FERRITIN  Imaging and Procedures obtained prior to SNF admission: CT Cervical Spine Wo Contrast Result  Date: 05/25/2023 CLINICAL DATA:  unwitnessed fall EXAM: CT CERVICAL SPINE WITHOUT CONTRAST TECHNIQUE: Multidetector CT imaging of the cervical spine was performed without intravenous contrast. Multiplanar CT image reconstructions were also generated. RADIATION DOSE REDUCTION: This exam was performed according to the departmental dose-optimization program which includes automated exposure control, adjustment of the mA and/or kV according to patient size and/or use of iterative reconstruction technique. COMPARISON:  CT cervical spine 05/22/2020 FINDINGS: Alignment: Normal. Skull base and vertebrae: Multilevel moderate severe degenerative changes spine and the C4-C6 levels. Severe osseous neuroforaminal stenosis at the left C5-C6 level. No acute fracture. No aggressive appearing focal osseous lesion or focal pathologic process. Soft tissues and spinal canal: No prevertebral fluid or swelling. No visible canal hematoma. Upper chest: Biapical pleural/pulmonary scarring. Other: Atherosclerotic plaque of the carotid arteries and vertebral arteries. IMPRESSION: No acute displaced fracture or traumatic listhesis of the cervical spine. Electronically  Signed   By: Morgane  Naveau M.D.   On: 05/25/2023 03:25   CT Head Wo Contrast Result Date: 05/25/2023 CLINICAL DATA:  Head trauma EXAM: CT HEAD WITHOUT CONTRAST TECHNIQUE: Contiguous axial images were obtained from the base of the skull through the vertex without intravenous contrast. RADIATION DOSE REDUCTION: This exam was performed according to the departmental dose-optimization program which includes automated exposure control, adjustment of the mA and/or kV according to patient size and/or use of iterative reconstruction technique. COMPARISON:  01/13/2021 FINDINGS: Brain: Faint hyperdensity near the frontal horn of the right lateral ventricle, likely mineralization. No mass, hemorrhage or extra-axial collection. Generalized volume loss. Chronic ischemic white matter changes. Vascular: Atherosclerotic calcification of the vertebral and internal carotid arteries at the skull base. No abnormal hyperdensity of the major intracranial arteries or dural venous sinuses. Skull: The visualized skull base, calvarium and extracranial soft tissues are normal. Sinuses/Orbits: No fluid levels or advanced mucosal thickening of the visualized paranasal sinuses. No mastoid or middle ear effusion. Normal orbits. Other: None. IMPRESSION: 1. No acute intracranial abnormality. 2. Faint hyperdensity near the frontal horn of the right lateral ventricle, likely mineralization. 3. Generalized volume loss and chronic ischemic white matter changes. Electronically Signed   By: Franky Stanford M.D.   On: 05/25/2023 02:15    Assessment and Plan Assessment & Plan  Major Neurocognitive disorder with behavioral disturbances Hospice care patient Intermittent agitation  Cont with supportive care  Functional Assessment Staging Scale: 7d - Cannot sit up without assistance.   Depression  Cont with lexapro   GERD Cont with omeprazole

## 2024-03-04 ENCOUNTER — Encounter: Payer: Self-pay | Admitting: Sports Medicine

## 2024-04-07 ENCOUNTER — Non-Acute Institutional Stay (SKILLED_NURSING_FACILITY): Payer: Self-pay | Admitting: Nurse Practitioner

## 2024-04-07 ENCOUNTER — Encounter: Payer: Self-pay | Admitting: Nurse Practitioner

## 2024-04-07 DIAGNOSIS — F0390 Unspecified dementia without behavioral disturbance: Secondary | ICD-10-CM | POA: Diagnosis not present

## 2024-04-07 DIAGNOSIS — K219 Gastro-esophageal reflux disease without esophagitis: Secondary | ICD-10-CM

## 2024-04-07 DIAGNOSIS — F418 Other specified anxiety disorders: Secondary | ICD-10-CM

## 2024-04-08 ENCOUNTER — Encounter: Payer: Self-pay | Admitting: Nurse Practitioner

## 2024-04-08 NOTE — Assessment & Plan Note (Signed)
 stable, taking Omeprazole. Hgb 13.3 03/03/23

## 2024-04-08 NOTE — Assessment & Plan Note (Signed)
 Depression/anxiety, intermittent agitations, on Lexpro, Depakote , off Clonazepam , Hydroxyzine, Quetiapine , TSH 3.08 04/09/23

## 2024-04-08 NOTE — Assessment & Plan Note (Signed)
 Dementia/behavioral issues,  off Namenda , Seroquel , MMSE 10/30 06/27/22  Adult failure to thrive/Weight loss, gradual weight loss. Under Hospice service.

## 2024-04-08 NOTE — Progress Notes (Signed)
 Location:   [facility] Room Number: 60 Place of Service:  SNF (31) Provider: Larwance Willman Cuny NP  Nesta Kimple X, NP  Patient Care Team: Matt Delpizzo X, NP as PCP - General (Internal Medicine)  Extended Emergency Contact Information Primary Emergency Contact: Sallyann DELTA Quant Address: [address]          [place], [place] [address] [place] Home Phone: [phone] Mobile Phone: [phone] Relation: Relative Secondary Emergency Contact: West,Doug Home Phone: [phone] Mobile Phone: [phone] Relation: Relative  Code Status:  DNR Goals of care: Advanced Directive information    02/05/2024   12:18 PM  Advanced Directives  Does Patient Have a Medical Advance Directive? Yes  Type of Estate Agent of [place];Out of facility DNR (pink MOST or yellow form);Living will  Does patient want to make changes to medical advance directive? No - Patient declined  Copy of Healthcare Power of Attorney in Chart? Yes - validated most recent copy scanned in chart (See row information)     Chief Complaint  Patient presents with   Medical Management of Chronic Issues    Routine Visit    HPI:  Pt is a 89 y.o. female seen today for medical management of chronic diseases.    DEXA 06/20/22 t score -2.597, off Ca, Vit D, Alendronate, goal of care is comfort measures.              Dementia/behavioral issues,  off Namenda , Seroquel , MMSE 10/30 06/27/22             Depression/anxiety, intermittent agitations, on Lexpro, Depakote , off Clonazepam , Hydroxyzine, Quetiapine , TSH 3.08 04/09/23             Adult failure to thrive/Weight loss, gradual weight loss. Under Hospice service.  CKD stable, Bun/creat 12/1.1 03/03/23 Elevated TSH 3.08 04/09/23, normalized.  Gait abnormality, w/c for mobility, risk of falling due to lack of safety awareness and needs assistance with transfer.  GERD, stable, taking Omeprazole. Hgb 13.3 03/03/23 Seroma per surgeon  consultation 02/19/22 CT abd/pelvis with fluid collection in the liver at the same site of prior abscess in 2021     Past Medical History:  Diagnosis Date   Arthritis    Dementia (HCC)    Hx of concussion    [facility]    Hyperlipidemia    Vitamin D deficiency, unspecified    Past Surgical History:  Procedure Laterality Date   CHOLECYSTECTOMY      Allergies  Allergen Reactions   Fish Protein-Containing Drug Products    Pravachol [Pravastatin Sodium]     Allergies as of 04/07/2024       Reactions   Fish Protein-containing Drug Products    Pravachol [pravastatin Sodium]         Medication List        Accurate as of April 07, 2024 11:59 PM. If you have any questions, ask your nurse or doctor.          Artificial Tears ophthalmic solution Place 3 drops into both eyes 3 (three) times daily. related to Dry eye syndrome of bilateral lacrimal glands   Boost Very High Calorie Liqd Take 1 Can by mouth 2 (two) times daily after a meal.   divalproex  125 MG capsule Commonly known as: DEPAKOTE  SPRINKLE Take 125 mg by mouth 2 (two) times daily.   escitalopram 10 MG tablet Commonly known as: LEXAPRO Take 10 mg by mouth daily.   omeprazole 20 MG capsule Commonly known as: PRILOSEC  Take 20 mg by mouth daily.        Review of Systems  Unable to perform ROS: Dementia    Immunization History  Administered Date(s) Administered   INFLUENZA, HIGH DOSE SEASONAL PF 04/14/2023, 03/01/2024   Influenza-Unspecified 02/26/2019, 03/07/2020, 03/13/2021, 03/24/2022   Moderna Covid-19 Vaccine Bivalent Booster 62yrs & up 03/27/2022, 03/11/2023   Moderna Sars-Covid-2 Vaccination 05/28/2019, 06/25/2019, 07/23/2019, 04/03/2020, 10/23/2020, 10/11/2021   PFIZER(Purple Top)SARS-COV-2 Vaccination 02/12/2021   Pneumococcal Conjugate-13 12/31/2018   Pneumococcal Polysaccharide-23 02/24/2004   Pneumococcal-Unspecified 05/22/2016   Respiratory Syncytial Virus  Vaccine,Recomb Aduvanted(Arexvy) 06/13/2022   Tdap 10/03/2017, 01/13/2021   Unspecified SARS-COV-2 Vaccination 03/21/2024   Zoster Recombinant(Shingrix) 04/29/2021, 06/26/2021   Zoster, Live 06/12/2022   Pertinent  Health Maintenance Due  Topic Date Due   Influenza Vaccine  Completed   DEXA SCAN  Completed      01/13/2021    6:42 PM 02/19/2022   12:53 AM 06/24/2022    2:30 PM 06/27/2022   11:00 AM 10/28/2022    9:50 AM  Fall Risk  Falls in the past year?   0 0 0  Was there an injury with Fall?   0 0 0  Fall Risk Category Calculator   0  0  (RETIRED) Patient Fall Risk Level Moderate fall risk  High fall risk      Patient at Risk for Falls Due to   No Fall Risks No Fall Risks History of fall(s)  Fall risk Follow up   Falls evaluation completed Falls evaluation completed Falls evaluation completed     Data saved with a previous flowsheet row definition   Functional Status Survey:    Vitals:   04/07/24 1318  BP: 105/72  Pulse: 90  Resp: 16  Temp: (!) 97.5 F (36.4 C)  SpO2: 92%  Weight: 124 lb 3.2 oz (56.3 kg)  Height: 5' 6 (1.676 m)   Body mass index is 20.05 kg/m. Physical Exam Vitals and nursing note reviewed.  Constitutional:      Appearance: Normal appearance.     Comments: Gradual weight loss   HENT:     Head: Normocephalic and atraumatic.     Mouth/Throat:     Mouth: Mucous membranes are moist.  Eyes:     Extraocular Movements: Extraocular movements intact.     Conjunctiva/sclera: Conjunctivae normal.     Pupils: Pupils are equal, round, and reactive to light.  Cardiovascular:     Rate and Rhythm: Normal rate and regular rhythm.     Heart sounds: No murmur heard. Pulmonary:     Effort: Pulmonary effort is normal.     Breath sounds: No rales.  Abdominal:     General: Bowel sounds are normal.     Palpations: Abdomen is soft.     Tenderness: There is no abdominal tenderness.  Musculoskeletal:     Right lower leg: No edema.     Left lower leg: No edema.   Skin:    General: Skin is warm and dry.  Neurological:     General: No focal deficit present.     Mental Status: She is alert. Mental status is at baseline.     Gait: Gait abnormal.     Comments: Oriented to person W/c for mobility  Psychiatric:     Comments: Confused, pleasant, conversing, intermittent agitation     Labs reviewed: No results for input(s): NA, K, CL, CO2, GLUCOSE, BUN, CREATININE, CALCIUM, MG, PHOS in the last 8760 hours. No results for input(s): AST, ALT,  ALKPHOS, BILITOT, PROT, ALBUMIN in the last 8760 hours. No results for input(s): WBC, NEUTROABS, HGB, HCT, MCV, PLT in the last 8760 hours. Lab Results  Component Value Date   TSH 3.08 04/09/2023   No results found for: HGBA1C No results found for: CHOL, HDL, LDLCALC, LDLDIRECT, TRIG, CHOLHDL  Significant Diagnostic Results in last 30 days:  No results found.  Assessment/Plan  GERD (gastroesophageal reflux disease)  stable, taking Omeprazole. Hgb 13.3 03/03/23  Depression with anxiety  Depression/anxiety, intermittent agitations, on Lexpro, Depakote , off Clonazepam , Hydroxyzine, Quetiapine , TSH 3.08 04/09/23   Family/ staff Communication: plan of care reviewed with the patient and charge nurse.   Labs/tests ordered:  none

## 2024-04-15 ENCOUNTER — Encounter: Payer: Self-pay | Admitting: Nurse Practitioner

## 2024-04-15 ENCOUNTER — Non-Acute Institutional Stay (SKILLED_NURSING_FACILITY): Payer: Self-pay | Admitting: Nurse Practitioner

## 2024-04-15 DIAGNOSIS — K219 Gastro-esophageal reflux disease without esophagitis: Secondary | ICD-10-CM | POA: Diagnosis not present

## 2024-04-15 DIAGNOSIS — R627 Adult failure to thrive: Secondary | ICD-10-CM | POA: Diagnosis not present

## 2024-04-15 DIAGNOSIS — F418 Other specified anxiety disorders: Secondary | ICD-10-CM | POA: Diagnosis not present

## 2024-04-15 DIAGNOSIS — F0390 Unspecified dementia without behavioral disturbance: Secondary | ICD-10-CM | POA: Diagnosis not present

## 2024-04-15 NOTE — Progress Notes (Signed)
 Location:   [facility] Room Number: 77 Place of Service:  SNF (31) Provider: Larwance Jovonni Borquez NP  Palma Buster X, NP  Patient Care Team: Jezel Basto X, NP as PCP - General (Internal Medicine)  Extended Emergency Contact Information Primary Emergency Contact: Jeffreys,(POA) Margaret Address: [address]          [place], [place] [address] [place] Home Phone: [phone] Mobile Phone: [phone] Relation: Relative Secondary Emergency Contact: West,Doug Home Phone: [phone] Mobile Phone: [phone] Relation: Relative  Code Status: DNR Goals of care: Advanced Directive information    02/05/2024   12:18 PM  Advanced Directives  Does Patient Have a Medical Advance Directive? Yes  Type of Estate Agent of [place];Out of facility DNR (pink MOST or yellow form);Living will  Does patient want to make changes to medical advance directive? No - Patient declined  Copy of Healthcare Power of Attorney in Chart? Yes - validated most recent copy scanned in chart (See row information)     Chief Complaint  Patient presents with   Acute Visit    Aggressive and combative behaviors    HPI:  Pt is a 89 y.o. female seen today for an acute visit for persistent aggressive and combative behaviors especially during ADL assistance.  Presently taking Depakote  125 mg twice daily and Lexapro 10 mg/day DEXA 06/20/22 t score -2.597, off Ca, Vit D, Alendronate, goal of care is comfort measures.              Dementia/behavioral issues,  off Namenda , Seroquel , MMSE 10/30 06/27/22             Depression/anxiety, intermittent agitations, on Lexpro, Depakote , off Clonazepam , Hydroxyzine, Quetiapine , TSH 3.08 04/09/23             Adult failure to thrive/Weight loss, gradual weight loss. Under Hospice service.  CKD stable, Bun/creat 12/1.1 03/03/23 Elevated TSH 3.08 04/09/23, normalized.  Gait abnormality, w/c for mobility, risk of falling due to lack of safety  awareness and needs assistance with transfer.  GERD, stable, taking Omeprazole. Hgb 13.3 03/03/23 Seroma per surgeon consultation 02/19/22 CT abd/pelvis with fluid collection in the liver at the same site of prior abscess in 2021     Past Medical History:  Diagnosis Date   Arthritis    Dementia (HCC)    Hx of concussion    [facility]    Hyperlipidemia    Vitamin D deficiency, unspecified    Past Surgical History:  Procedure Laterality Date   CHOLECYSTECTOMY      Allergies  Allergen Reactions   Fish Protein-Containing Drug Products    Pravachol [Pravastatin Sodium]     Allergies as of 04/15/2024       Reactions   Fish Protein-containing Drug Products    Pravachol [pravastatin Sodium]         Medication List        Accurate as of April 15, 2024  4:09 PM. If you have any questions, ask your nurse or doctor.          Artificial Tears ophthalmic solution Place 3 drops into both eyes 3 (three) times daily. related to Dry eye syndrome of bilateral lacrimal glands   Boost Very High Calorie Liqd Take 1 Can by mouth 2 (two) times daily after a meal.   divalproex  125 MG capsule Commonly known as: DEPAKOTE  SPRINKLE Take 125 mg by mouth 3 (three) times daily.   escitalopram 10 MG tablet Commonly known as:  LEXAPRO Take 10 mg by mouth daily.   omeprazole 20 MG capsule Commonly known as: PRILOSEC Take 20 mg by mouth daily.        Review of Systems  Unable to perform ROS: Dementia    Immunization History  Administered Date(s) Administered   INFLUENZA, HIGH DOSE SEASONAL PF 04/14/2023, 03/01/2024   Influenza-Unspecified 02/26/2019, 03/07/2020, 03/13/2021, 03/24/2022   Moderna Covid-19 Vaccine Bivalent Booster 74yrs & up 03/27/2022, 03/11/2023   Moderna Sars-Covid-2 Vaccination 05/28/2019, 06/25/2019, 07/23/2019, 04/03/2020, 10/23/2020, 10/11/2021   PFIZER(Purple Top)SARS-COV-2 Vaccination 02/12/2021   Pneumococcal Conjugate-13  12/31/2018   Pneumococcal Polysaccharide-23 02/24/2004   Pneumococcal-Unspecified 05/22/2016   Respiratory Syncytial Virus Vaccine,Recomb Aduvanted(Arexvy) 06/13/2022   Tdap 10/03/2017, 01/13/2021   Unspecified SARS-COV-2 Vaccination 03/21/2024   Zoster Recombinant(Shingrix) 04/29/2021, 06/26/2021   Zoster, Live 06/12/2022   Pertinent  Health Maintenance Due  Topic Date Due   Influenza Vaccine  Completed   Bone Density Scan  Completed      01/13/2021    6:42 PM 02/19/2022   12:53 AM 06/24/2022    2:30 PM 06/27/2022   11:00 AM 10/28/2022    9:50 AM  Fall Risk  Falls in the past year?   0 0 0  Was there an injury with Fall?   0 0 0  Fall Risk Category Calculator   0  0  (RETIRED) Patient Fall Risk Level Moderate fall risk  High fall risk      Patient at Risk for Falls Due to   No Fall Risks No Fall Risks History of fall(s)  Fall risk Follow up   Falls evaluation completed Falls evaluation completed Falls evaluation completed     Data saved with a previous flowsheet row definition   Functional Status Survey:    Vitals:   04/15/24 1603  BP: 90/60  Pulse: 79  Resp: 16  Temp: (!) 97.5 F (36.4 C)  SpO2: 92%  Weight: 124 lb 3.2 oz (56.3 kg)   Body mass index is 20.05 kg/m. Physical Exam Vitals and nursing note reviewed.  Constitutional:      Appearance: Normal appearance.     Comments: Gradual weight loss   HENT:     Head: Normocephalic and atraumatic.     Mouth/Throat:     Mouth: Mucous membranes are moist.  Eyes:     Extraocular Movements: Extraocular movements intact.     Conjunctiva/sclera: Conjunctivae normal.     Pupils: Pupils are equal, round, and reactive to light.  Cardiovascular:     Rate and Rhythm: Normal rate and regular rhythm.     Heart sounds: No murmur heard. Pulmonary:     Effort: Pulmonary effort is normal.     Breath sounds: No rales.  Abdominal:     General: Bowel sounds are normal.     Palpations: Abdomen is soft.     Tenderness: There is  no abdominal tenderness.  Musculoskeletal:     Right lower leg: No edema.     Left lower leg: No edema.  Skin:    General: Skin is warm and dry.  Neurological:     General: No focal deficit present.     Mental Status: She is alert. Mental status is at baseline.     Gait: Gait abnormal.     Comments: Oriented to person W/c for mobility  Psychiatric:     Comments: Confused, pleasant, conversing, intermittent agitation     Labs reviewed: No results for input(s): NA, K, CL, CO2, GLUCOSE, BUN, CREATININE, CALCIUM, MG,  PHOS in the last 8760 hours. No results for input(s): AST, ALT, ALKPHOS, BILITOT, PROT, ALBUMIN in the last 8760 hours. No results for input(s): WBC, NEUTROABS, HGB, HCT, MCV, PLT in the last 8760 hours. Lab Results  Component Value Date   TSH 3.08 04/09/2023   No results found for: HGBA1C No results found for: CHOL, HDL, LDLCALC, LDLDIRECT, TRIG, CHOLHDL  Significant Diagnostic Results in last 30 days:  No results found.  Assessment/Plan: Major neurocognitive disorder (HCC) off Namenda , Seroquel , MMSE 10/30 06/27/22 Will increase Depakote  125 mg to 3 times daily/twice daily to better manage aggressive and combative behaviors  Depression with anxiety intermittent agitations, on Lexpro, Depakote , off Clonazepam , Hydroxyzine, Quetiapine , TSH 3.08 04/09/23  Adult failure to thrive Adult failure to thrive/Weight loss, gradual weight loss. Under Hospice service.     Family/ staff Communication: Plan of care reviewed with the patient and charge nurse  Labs/tests ordered: None

## 2024-04-15 NOTE — Assessment & Plan Note (Signed)
 stable, taking Omeprazole. Hgb 13.3 03/03/23

## 2024-04-15 NOTE — Assessment & Plan Note (Signed)
 Adult failure to thrive/Weight loss, gradual weight loss. Under Hospice service.

## 2024-04-15 NOTE — Assessment & Plan Note (Signed)
 off Namenda , Seroquel , MMSE 10/30 06/27/22 Will increase Depakote  125 mg to 3 times daily/twice daily to better manage aggressive and combative behaviors

## 2024-04-15 NOTE — Assessment & Plan Note (Signed)
 intermittent agitations, on Lexpro, Depakote , off Clonazepam , Hydroxyzine, Quetiapine , TSH 3.08 04/09/23

## 2024-05-02 ENCOUNTER — Non-Acute Institutional Stay (SKILLED_NURSING_FACILITY): Payer: Self-pay | Admitting: Nurse Practitioner

## 2024-05-02 DIAGNOSIS — K219 Gastro-esophageal reflux disease without esophagitis: Secondary | ICD-10-CM

## 2024-05-02 DIAGNOSIS — F418 Other specified anxiety disorders: Secondary | ICD-10-CM

## 2024-05-02 DIAGNOSIS — F0390 Unspecified dementia without behavioral disturbance: Secondary | ICD-10-CM

## 2024-05-02 NOTE — Assessment & Plan Note (Signed)
,   intermittent agitations, on Lexpro, failed GDR of Depakote , off Clonazepam , Hydroxyzine, Quetiapine , TSH 3.08 04/09/23

## 2024-05-02 NOTE — Progress Notes (Unsigned)
 Location:   [facility] Room Number: 76 Place of Service:  SNF (31) Provider: Larwance Michaelyn Wall NP  Deanndra Kirley X, NP  Patient Care Team: Puneet Masoner X, NP as PCP - General (Internal Medicine)  Extended Emergency Contact Information Primary Emergency Contact: Sallyann DELTA Quant Address: [address]          [place], [place] [address] [place] Home Phone: [phone] Mobile Phone: [phone] Relation: Relative Secondary Emergency Contact: West,Doug Home Phone: [phone] Mobile Phone: [phone] Relation: Relative  Code Status:  DNR Goals of care: Advanced Directive information    02/05/2024   12:18 PM  Advanced Directives  Does Patient Have a Medical Advance Directive? Yes  Type of Estate Agent of [place];Out of facility DNR (pink MOST or yellow form);Living will  Does patient want to make changes to medical advance directive? No - Patient declined  Copy of Healthcare Power of Attorney in Chart? Yes - validated most recent copy scanned in chart (See row information)     Chief Complaint  Patient presents with  . Medical Management of Chronic Issues    HPI:  Pt is a 89 y.o. female seen today for medical management of chronic diseases.   DEXA 06/20/22 t score -2.597, off Ca, Vit D, Alendronate, goal of care is comfort measures.              Dementia/behavioral issues,  off Namenda , Seroquel , MMSE 10/30 06/27/22             Depression/anxiety, intermittent agitations, on Lexpro, failed GDR of Depakote , off Clonazepam , Hydroxyzine, Quetiapine , TSH 3.08 04/09/23             Adult failure to thrive/Weight loss, gradual weight loss. Under Hospice service.  CKD stable, Bun/creat 12/1.1 03/03/23 Elevated TSH 3.08 04/09/23, normalized.  Gait abnormality, w/c for mobility, risk of falling due to lack of safety awareness and needs assistance with transfer.  GERD, stable, taking Omeprazole. Hgb 13.3 03/03/23 Seroma per surgeon  consultation 02/19/22 CT abd/pelvis with fluid collection in the liver at the same site of prior abscess in 2021       Past Medical History:  Diagnosis Date  . Arthritis   . Dementia (HCC)   . Hx of concussion    [facility]   . Hyperlipidemia   . Vitamin D deficiency, unspecified    Past Surgical History:  Procedure Laterality Date  . CHOLECYSTECTOMY      Allergies  Allergen Reactions  . Fish Protein-Containing Drug Products   . Pravachol [Pravastatin Sodium]     Allergies as of 05/02/2024       Reactions   Fish Protein-containing Drug Products    Pravachol [pravastatin Sodium]         Medication List        Accurate as of May 02, 2024  4:20 PM. If you have any questions, ask your nurse or doctor.          Artificial Tears ophthalmic solution Place 3 drops into both eyes 3 (three) times daily. related to Dry eye syndrome of bilateral lacrimal glands   Boost Very High Calorie Liqd Take 1 Can by mouth 2 (two) times daily after a meal.   divalproex  125 MG capsule Commonly known as: DEPAKOTE  SPRINKLE Take 125 mg by mouth 3 (three) times daily.   escitalopram 10 MG tablet Commonly known as: LEXAPRO Take 10 mg by mouth daily.   omeprazole 20 MG capsule Commonly known as: PRILOSEC  Take 20 mg by mouth daily.        Review of Systems  Unable to perform ROS: Dementia    Immunization History  Administered Date(s) Administered  . INFLUENZA, HIGH DOSE SEASONAL PF 04/14/2023, 03/01/2024  . Influenza-Unspecified 02/26/2019, 03/07/2020, 03/13/2021, 03/24/2022  . Moderna Covid-19 Vaccine Bivalent Booster 60yrs & up 03/27/2022, 03/11/2023  . Moderna Sars-Covid-2 Vaccination 05/28/2019, 06/25/2019, 07/23/2019, 04/03/2020, 10/23/2020, 10/11/2021  . PFIZER(Purple Top)SARS-COV-2 Vaccination 02/12/2021  . Pneumococcal Conjugate-13 12/31/2018  . Pneumococcal Polysaccharide-23 02/24/2004  . Pneumococcal-Unspecified 05/22/2016  .  Respiratory Syncytial Virus Vaccine,Recomb Aduvanted(Arexvy) 06/13/2022  . Tdap 10/03/2017, 01/13/2021  . Unspecified SARS-COV-2 Vaccination 03/21/2024  . Zoster Recombinant(Shingrix) 04/29/2021, 06/26/2021  . Zoster, Live 06/12/2022   Pertinent  Health Maintenance Due  Topic Date Due  . Influenza Vaccine  Completed  . Bone Density Scan  Completed      01/13/2021    6:42 PM 02/19/2022   12:53 AM 06/24/2022    2:30 PM 06/27/2022   11:00 AM 10/28/2022    9:50 AM  Fall Risk  Falls in the past year?   0 0 0  Was there an injury with Fall?   0  0  0   Fall Risk Category Calculator   0  0  (RETIRED) Patient Fall Risk Level Moderate fall risk  High fall risk      Patient at Risk for Falls Due to   No Fall Risks No Fall Risks History of fall(s)  Fall risk Follow up   Falls evaluation completed Falls evaluation completed Falls evaluation completed     Data saved with a previous flowsheet row definition   Functional Status Survey:    Vitals:   05/02/24 1243  BP: 121/65  Pulse: 79  Resp: 17  Temp: (!) 97 F (36.1 C)  SpO2: 96%  Weight: 124 lb 8 oz (56.5 kg)   Body mass index is 20.09 kg/m. Physical Exam Vitals and nursing note reviewed.  Constitutional:      Appearance: Normal appearance.     Comments: Gradual weight loss   HENT:     Head: Normocephalic and atraumatic.     Mouth/Throat:     Mouth: Mucous membranes are moist.  Eyes:     Extraocular Movements: Extraocular movements intact.     Conjunctiva/sclera: Conjunctivae normal.     Pupils: Pupils are equal, round, and reactive to light.  Cardiovascular:     Rate and Rhythm: Normal rate and regular rhythm.     Heart sounds: No murmur heard. Pulmonary:     Effort: Pulmonary effort is normal.     Breath sounds: No rales.  Abdominal:     General: Bowel sounds are normal.     Palpations: Abdomen is soft.     Tenderness: There is no abdominal tenderness.  Musculoskeletal:     Right lower leg: No edema.     Left lower  leg: No edema.  Skin:    General: Skin is warm and dry.  Neurological:     General: No focal deficit present.     Mental Status: She is alert. Mental status is at baseline.     Gait: Gait abnormal.     Comments: Oriented to person W/c for mobility  Psychiatric:     Comments: Confused, pleasant, conversing, intermittent agitation     Labs reviewed: No results for input(s): NA, K, CL, CO2, GLUCOSE, BUN, CREATININE, CALCIUM, MG, PHOS in the last 8760 hours. No results for input(s): AST, ALT, ALKPHOS, BILITOT,  PROT, ALBUMIN in the last 8760 hours. No results for input(s): WBC, NEUTROABS, HGB, HCT, MCV, PLT in the last 8760 hours. Lab Results  Component Value Date   TSH 3.08 04/09/2023   No results found for: HGBA1C No results found for: CHOL, HDL, LDLCALC, LDLDIRECT, TRIG, CHOLHDL  Significant Diagnostic Results in last 30 days:  No results found.  Assessment/Plan  Major neurocognitive disorder (HCC)  off Namenda , Seroquel , MMSE 10/30 06/27/22  Depression with anxiety , intermittent agitations, on Lexpro, failed GDR of Depakote , off Clonazepam , Hydroxyzine, Quetiapine , TSH 3.08 04/09/23  GERD (gastroesophageal reflux disease) Stable, continue Omeprazole.    Family/ staff Communication: plan of care reviewed with the patient and charge nurse.   Labs/tests ordered:  none

## 2024-05-02 NOTE — Assessment & Plan Note (Signed)
 off Namenda , Seroquel , MMSE 10/30 06/27/22

## 2024-05-02 NOTE — Assessment & Plan Note (Signed)
Stable, continue Omeprazole.  

## 2024-06-15 ENCOUNTER — Encounter: Payer: Self-pay | Admitting: Family Medicine

## 2024-06-15 ENCOUNTER — Non-Acute Institutional Stay (SKILLED_NURSING_FACILITY): Payer: Self-pay | Admitting: Family Medicine

## 2024-06-15 DIAGNOSIS — F0390 Unspecified dementia without behavioral disturbance: Secondary | ICD-10-CM | POA: Diagnosis not present

## 2024-06-15 DIAGNOSIS — R634 Abnormal weight loss: Secondary | ICD-10-CM

## 2024-06-15 DIAGNOSIS — R2681 Unsteadiness on feet: Secondary | ICD-10-CM | POA: Diagnosis not present

## 2024-06-15 NOTE — Assessment & Plan Note (Addendum)
 Active safety awareness dictates need for wheelchair for ambulation

## 2024-06-15 NOTE — Progress Notes (Signed)
 " Provider:  Garnette Pinal, MD Location:      Place of Service:     PCP: Mast, Man X, NP Patient Care Team: Mast, Man X, NP as PCP - General (Internal Medicine)  Extended Emergency Contact Information Primary Emergency Contact: Sallyann DELTA Quant Address: [address]          [place], [place] [address] [place] Home Phone: [phone] Mobile Phone: [phone] Relation: Relative Secondary Emergency Contact: West,Doug Home Phone: [phone] Mobile Phone: [phone] Relation: Relative  Code Status:  Goals of Care: Advanced Directive information    02/05/2024   12:18 PM  Advanced Directives  Does Patient Have a Medical Advance Directive? Yes  Type of Estate Agent of [place];Out of facility DNR (pink MOST or yellow form);Living will  Does patient want to make changes to medical advance directive? No - Patient declined  Copy of Healthcare Power of Attorney in Chart? Yes - validated most recent copy scanned in chart (See row information)     HPI: Patient is a 89 y.o. female seen today for medical management of chronic problems including: Dementia, degenerative arthritis, hyperlipidemia, and failure to thrive and chronic kidney disease stage III.  She is followed by hospice.  Medicines are minimal including Depakote  sprinkle, Lexapro, and omeprazole.  Past Medical History:  Diagnosis Date   Arthritis    Dementia (HCC)    Hx of concussion    [facility]    Hyperlipidemia    Vitamin D deficiency, unspecified    Past Surgical History:  Procedure Laterality Date   CHOLECYSTECTOMY      reports that she has never smoked. She has never used smokeless tobacco. She reports that she does not drink alcohol and does not use drugs. Social History   Socioeconomic History   Marital status: Single    Spouse name: Not on file   Number of children: Not on file   Years of education: Not on file   Highest education  level: Not on file  Occupational History   Not on file  Tobacco Use   Smoking status: Never   Smokeless tobacco: Never  Vaping Use   Vaping status: Never Used  Substance and Sexual Activity   Alcohol use: Never   Drug use: Never   Sexual activity: Never  Other Topics Concern   Not on file  Social History Narrative   Not on file   Social Drivers of Health   Tobacco Use: Low Risk (04/15/2024)   Patient History    Smoking Tobacco Use: Never    Smokeless Tobacco Use: Never    Passive Exposure: Not on file  Financial Resource Strain: Not on file  Food Insecurity: Not on file  Transportation Needs: Not on file  Physical Activity: Not on file  Stress: Not on file  Social Connections: Not on file  Intimate Partner Violence: Not on file  Depression (EYV7-0): Not on file  Alcohol Screen: Not on file  Housing: Not on file  Utilities: Not on file  Health Literacy: Not on file    Functional Status Survey:    Family History  Family history unknown: Yes    Health Maintenance  Topic Date Due   COVID-19 Vaccine (11 - 2025-26 season) 09/19/2024   DTaP/Tdap/Td (3 - Td or Tdap) 01/14/2031   Pneumococcal Vaccine: 50+ Years  Completed   Influenza Vaccine  Completed   Bone Density Scan  Completed   Zoster Vaccines- Shingrix  Completed   Meningococcal B Vaccine  Aged Out    Allergies[1]  Outpatient Encounter Medications as of 06/15/2024  Medication Sig   Artificial Tears ophthalmic solution Place 3 drops into both eyes 3 (three) times daily. related to Dry eye syndrome of bilateral lacrimal glands   divalproex  (DEPAKOTE  SPRINKLE) 125 MG capsule Take 125 mg by mouth 3 (three) times daily.   escitalopram (LEXAPRO) 10 MG tablet Take 10 mg by mouth daily.   Nutritional Supplements (BOOST VERY HIGH CALORIE) LIQD Take 1 Can by mouth 2 (two) times daily after a meal.   omeprazole (PRILOSEC) 20 MG capsule Take 20 mg by mouth daily.   No facility-administered encounter medications on  file as of 06/15/2024.    Review of Systems  Unable to perform ROS: Dementia  Allergic/Immunologic: Positive for immunocompromised state.    There were no vitals filed for this visit. There is no height or weight on file to calculate BMI. Physical Exam Vitals and nursing note reviewed.  Constitutional:      Appearance: Normal appearance.  HENT:     Head: Normocephalic.  Eyes:     Pupils: Pupils are equal, round, and reactive to light.  Cardiovascular:     Rate and Rhythm: Normal rate and regular rhythm.  Pulmonary:     Effort: Pulmonary effort is normal.     Breath sounds: Normal breath sounds.  Abdominal:     General: Bowel sounds are normal.     Palpations: Abdomen is soft.  Musculoskeletal:     Comments: Patient wheelchair-bound  Skin:    General: Skin is warm and dry.  Neurological:     General: No focal deficit present.     Mental Status: She is alert.     Comments: Oriented to person     Labs reviewed: Basic Metabolic Panel: No results for input(s): NA, K, CL, CO2, GLUCOSE, BUN, CREATININE, CALCIUM, MG, PHOS in the last 8760 hours. Liver Function Tests: No results for input(s): AST, ALT, ALKPHOS, BILITOT, PROT, ALBUMIN in the last 8760 hours. No results for input(s): LIPASE, AMYLASE in the last 8760 hours. No results for input(s): AMMONIA in the last 8760 hours. CBC: No results for input(s): WBC, NEUTROABS, HGB, HCT, MCV, PLT in the last 8760 hours. Cardiac Enzymes: No results for input(s): CKTOTAL, CKMB, CKMBINDEX, TROPONINI in the last 8760 hours. BNP: Invalid input(s): POCBNP No results found for: HGBA1C Lab Results  Component Value Date   TSH 3.08 04/09/2023   Lab Results  Component Value Date   VITAMINB12 466 07/12/2019   No results found for: FOLATE No results found for: IRON, TIBC, FERRITIN  Imaging and Procedures obtained prior to SNF admission: CT Cervical Spine Wo  Contrast Result Date: 05/25/2023 CLINICAL DATA:  unwitnessed fall EXAM: CT CERVICAL SPINE WITHOUT CONTRAST TECHNIQUE: Multidetector CT imaging of the cervical spine was performed without intravenous contrast. Multiplanar CT image reconstructions were also generated. RADIATION DOSE REDUCTION: This exam was performed according to the departmental dose-optimization program which includes automated exposure control, adjustment of the mA and/or kV according to patient size and/or use of iterative reconstruction technique. COMPARISON:  CT cervical spine 05/22/2020 FINDINGS: Alignment: Normal. Skull base and vertebrae: Multilevel moderate severe degenerative changes spine and the C4-C6 levels. Severe osseous neuroforaminal stenosis at the left C5-C6 level. No acute fracture. No aggressive appearing focal osseous lesion or focal pathologic process. Soft tissues and spinal canal: No prevertebral fluid or swelling. No visible canal hematoma. Upper chest: Biapical pleural/pulmonary scarring. Other: Atherosclerotic plaque of the carotid arteries and vertebral arteries.  IMPRESSION: No acute displaced fracture or traumatic listhesis of the cervical spine. Electronically Signed   By: Morgane  Naveau M.D.   On: 05/25/2023 03:25   CT Head Wo Contrast Result Date: 05/25/2023 CLINICAL DATA:  Head trauma EXAM: CT HEAD WITHOUT CONTRAST TECHNIQUE: Contiguous axial images were obtained from the base of the skull through the vertex without intravenous contrast. RADIATION DOSE REDUCTION: This exam was performed according to the departmental dose-optimization program which includes automated exposure control, adjustment of the mA and/or kV according to patient size and/or use of iterative reconstruction technique. COMPARISON:  01/13/2021 FINDINGS: Brain: Faint hyperdensity near the frontal horn of the right lateral ventricle, likely mineralization. No mass, hemorrhage or extra-axial collection. Generalized volume loss. Chronic ischemic  white matter changes. Vascular: Atherosclerotic calcification of the vertebral and internal carotid arteries at the skull base. No abnormal hyperdensity of the major intracranial arteries or dural venous sinuses. Skull: The visualized skull base, calvarium and extracranial soft tissues are normal. Sinuses/Orbits: No fluid levels or advanced mucosal thickening of the visualized paranasal sinuses. No mastoid or middle ear effusion. Normal orbits. Other: None. IMPRESSION: 1. No acute intracranial abnormality. 2. Faint hyperdensity near the frontal horn of the right lateral ventricle, likely mineralization. 3. Generalized volume loss and chronic ischemic white matter changes. Electronically Signed   By: Franky Stanford M.D.   On: 05/25/2023 02:15    Assessment/Plan Assessment & Plan Major neurocognitive disorder (HCC) Last MMSE 10/30 Off medicines except Depakote  Weight loss Compared to 1 year ago weight is down from 1 36-121.7 Unsteady gait Active safety awareness dictates need for wheelchair for ambulation  Family/ staff Communication:   Labs/tests ordered:  .smmsig     [1]  Allergies Allergen Reactions   Fish Protein-Containing Drug Products    Pravachol [Pravastatin Sodium]    "

## 2024-06-15 NOTE — Assessment & Plan Note (Addendum)
 Compared to 1 year ago weight is down from 1 36-121.7

## 2024-06-15 NOTE — Assessment & Plan Note (Addendum)
 Last MMSE 10/30 Off medicines except Depakote 

## 2024-07-01 ENCOUNTER — Non-Acute Institutional Stay: Payer: Self-pay | Admitting: Nurse Practitioner

## 2024-07-01 ENCOUNTER — Encounter: Payer: Self-pay | Admitting: Nurse Practitioner

## 2024-07-01 DIAGNOSIS — F0390 Unspecified dementia without behavioral disturbance: Secondary | ICD-10-CM

## 2024-07-01 DIAGNOSIS — K219 Gastro-esophageal reflux disease without esophagitis: Secondary | ICD-10-CM

## 2024-07-01 DIAGNOSIS — R627 Adult failure to thrive: Secondary | ICD-10-CM

## 2024-07-01 DIAGNOSIS — F418 Other specified anxiety disorders: Secondary | ICD-10-CM

## 2024-07-01 DIAGNOSIS — N1831 Chronic kidney disease, stage 3a: Secondary | ICD-10-CM

## 2024-07-01 NOTE — Assessment & Plan Note (Signed)
 Advanced dementia, total care of ADLs, off Namenda , Seroquel , continue comfort measures under hospice service

## 2024-07-01 NOTE — Assessment & Plan Note (Signed)
 intermittent agitations, on Lexpro, failed GDR of Depakote , off Clonazepam , Hydroxyzine, Quetiapine , TSH 3.08 04/09/23

## 2024-07-01 NOTE — Assessment & Plan Note (Signed)
 Bun/creat 12/1.1 03/03/23

## 2024-07-01 NOTE — Progress Notes (Unsigned)
 " Location:   [facility] Room Number: 59 Place of Service:  SNF (31) Provider: Larwance Jaclynne Baldo NP  Jodee Wagenaar X, NP  Patient Care Team: Morrill Bomkamp X, NP as PCP - General (Internal Medicine)  Extended Emergency Contact Information Primary Emergency Contact: Sallyann DELTA Quant Address: [address]          [place], [place] [address] [place] Home Phone: [phone] Mobile Phone: [phone] Relation: Relative Secondary Emergency Contact: West,Doug Home Phone: [phone] Mobile Phone: [phone] Relation: Relative  Code Status:  DNR Goals of care: Advanced Directive information    02/05/2024   12:18 PM  Advanced Directives  Does Patient Have a Medical Advance Directive? Yes  Type of Estate Agent of [place];Out of facility DNR (pink MOST or yellow form);Living will  Does patient want to make changes to medical advance directive? No - Patient declined  Copy of Healthcare Power of Attorney in Chart? Yes - validated most recent copy scanned in chart (See row information)     Chief Complaint  Patient presents with   Medical Management of Chronic Issues    HPI:  Pt is a 89 y.o. female seen today for medical management of chronic diseases.     DEXA 06/20/22 t score -2.597, off Ca, Vit D, Alendronate, goal of care is comfort measures.              Dementia/behavioral issues,  off Namenda , Seroquel , MMSE 10/30 06/27/22             Depression/anxiety, intermittent agitations, on Lexpro, failed GDR of Depakote , off Clonazepam , Hydroxyzine, Quetiapine , TSH 3.08 04/09/23             Adult failure to thrive/Weight loss, gradual weight loss. Under Hospice service.  CKD stable, Bun/creat 12/1.1 03/03/23 Elevated TSH 3.08 04/09/23, normalized.  Gait abnormality, w/c for mobility, risk of falling due to lack of safety awareness and needs assistance with transfer.  GERD, stable, taking Omeprazole. Hgb 13.3 03/03/23 Seroma per surgeon  consultation 02/19/22 CT abd/pelvis with fluid collection in the liver at the same site of prior abscess in 2021      Past Medical History:  Diagnosis Date   Arthritis    Dementia (HCC)    Hx of concussion    [facility]    Hyperlipidemia    Vitamin D deficiency, unspecified    Past Surgical History:  Procedure Laterality Date   CHOLECYSTECTOMY      Allergies[1]  Allergies as of 07/01/2024       Reactions   Fish Protein-containing Drug Products    Pravachol [pravastatin Sodium]         Medication List        Accurate as of July 01, 2024  3:23 PM. If you have any questions, ask your nurse or doctor.          Artificial Tears ophthalmic solution Place 3 drops into both eyes 3 (three) times daily. related to Dry eye syndrome of bilateral lacrimal glands   Boost Very High Calorie Liqd Take 1 Can by mouth 2 (two) times daily after a meal.   divalproex  125 MG capsule Commonly known as: DEPAKOTE  SPRINKLE Take 125 mg by mouth 3 (three) times daily.   escitalopram 10 MG tablet Commonly known as: LEXAPRO Take 10 mg by mouth daily.   omeprazole 20 MG capsule Commonly known as: PRILOSEC Take 20 mg by mouth daily.        Review of  Systems  Unable to perform ROS: Dementia    Immunization History  Administered Date(s) Administered   INFLUENZA, HIGH DOSE SEASONAL PF 04/14/2023, 03/01/2024   Influenza-Unspecified 02/26/2019, 03/07/2020, 03/13/2021, 03/24/2022   Moderna Covid-19 Vaccine Bivalent Booster 96yrs & up 03/27/2022, 03/11/2023   Moderna Sars-Covid-2 Vaccination 05/28/2019, 06/25/2019, 07/23/2019, 04/03/2020, 10/23/2020, 10/11/2021   PFIZER(Purple Top)SARS-COV-2 Vaccination 02/12/2021   Pneumococcal Conjugate-13 12/31/2018   Pneumococcal Polysaccharide-23 02/24/2004   Pneumococcal-Unspecified 05/22/2016   Respiratory Syncytial Virus Vaccine,Recomb Aduvanted(Arexvy) 06/13/2022   Tdap 10/03/2017, 01/13/2021    Unspecified SARS-COV-2 Vaccination 03/21/2024   Zoster Recombinant(Shingrix) 04/29/2021, 06/26/2021   Zoster, Live 06/12/2022   Pertinent  Health Maintenance Due  Topic Date Due   Influenza Vaccine  Completed   Bone Density Scan  Completed      01/13/2021    6:42 PM 02/19/2022   12:53 AM 06/24/2022    2:30 PM 06/27/2022   11:00 AM 10/28/2022    9:50 AM  Fall Risk  Falls in the past year?   0 0 0  Was there an injury with Fall?   0  0  0   Fall Risk Category Calculator   0  0  (RETIRED) Patient Fall Risk Level Moderate fall risk  High fall risk      Patient at Risk for Falls Due to   No Fall Risks No Fall Risks History of fall(s)  Fall risk Follow up   Falls evaluation completed Falls evaluation completed Falls evaluation completed     Data saved with a previous flowsheet row definition   Functional Status Survey:    Vitals:   07/01/24 1511  BP: 121/71  Pulse: 80  Resp: 18  Temp: 98.7 F (37.1 C)  SpO2: 98%  Weight: 124 lb 11.2 oz (56.6 kg)   Body mass index is 20.13 kg/m. Physical Exam Vitals and nursing note reviewed.  Constitutional:      Appearance: Normal appearance.     Comments: Gradual weight loss   HENT:     Head: Normocephalic and atraumatic.     Mouth/Throat:     Mouth: Mucous membranes are moist.  Eyes:     Extraocular Movements: Extraocular movements intact.     Conjunctiva/sclera: Conjunctivae normal.     Pupils: Pupils are equal, round, and reactive to light.  Cardiovascular:     Rate and Rhythm: Normal rate and regular rhythm.     Heart sounds: No murmur heard. Pulmonary:     Effort: Pulmonary effort is normal.     Breath sounds: No rales.  Abdominal:     General: Bowel sounds are normal.     Palpations: Abdomen is soft.     Tenderness: There is no abdominal tenderness.  Musculoskeletal:     Right lower leg: No edema.     Left lower leg: No edema.  Skin:    General: Skin is warm and dry.  Neurological:     General: No focal deficit  present.     Mental Status: She is alert. Mental status is at baseline.     Gait: Gait abnormal.     Comments: Oriented to person W/c for mobility  Psychiatric:     Comments: Confused, pleasant, conversing, intermittent agitation     Labs reviewed: No results for input(s): NA, K, CL, CO2, GLUCOSE, BUN, CREATININE, CALCIUM, MG, PHOS in the last 8760 hours. No results for input(s): AST, ALT, ALKPHOS, BILITOT, PROT, ALBUMIN in the last 8760 hours. No results for input(s): WBC, NEUTROABS, HGB, HCT, MCV,  PLT in the last 8760 hours. Lab Results  Component Value Date   TSH 3.08 04/09/2023   No results found for: HGBA1C No results found for: CHOL, HDL, LDLCALC, LDLDIRECT, TRIG, CHOLHDL  Significant Diagnostic Results in last 30 days:  No results found.  Assessment/Plan  Adult failure to thrive Adult failure to thrive/Weight loss, gradual weight loss. Under Hospice service.   CKD (chronic kidney disease) stage 3, GFR 30-59 ml/min (HCC) Bun/creat 12/1.1 03/03/23  GERD (gastroesophageal reflux disease)  stable, taking Omeprazole. Hgb 13.3 03/03/23  Depression with anxiety intermittent agitations, on Lexpro, failed GDR of Depakote , off Clonazepam , Hydroxyzine, Quetiapine , TSH 3.08 04/09/23  Major neurocognitive disorder (HCC) Advanced dementia, total care of ADLs, off Namenda , Seroquel , continue comfort measures under hospice service   Family/ staff Communication: Plan of care reviewed with the patient and charge nurse  Labs/tests ordered: None        [1] Allergies Allergen Reactions   Fish Protein-Containing Drug Products    Pravachol [Pravastatin Sodium]   "

## 2024-07-01 NOTE — Assessment & Plan Note (Signed)
 stable, taking Omeprazole. Hgb 13.3 03/03/23

## 2024-07-01 NOTE — Assessment & Plan Note (Signed)
 Adult failure to thrive/Weight loss, gradual weight loss. Under Hospice service.
